# Patient Record
Sex: Male | Born: 1950 | Race: White | Hispanic: No | Marital: Married | Smoking: Current every day smoker
Health system: Southern US, Community
[De-identification: ages and names within clinical notes are randomized; demographics above are authoritative.]

## PROBLEM LIST (undated history)

## (undated) DIAGNOSIS — G4730 Sleep apnea, unspecified: Secondary | ICD-10-CM

## (undated) DIAGNOSIS — R7303 Prediabetes: Secondary | ICD-10-CM

## (undated) DIAGNOSIS — I10 Essential (primary) hypertension: Secondary | ICD-10-CM

## (undated) DIAGNOSIS — R0600 Dyspnea, unspecified: Secondary | ICD-10-CM

## (undated) DIAGNOSIS — G4700 Insomnia, unspecified: Secondary | ICD-10-CM

## (undated) DIAGNOSIS — I499 Cardiac arrhythmia, unspecified: Secondary | ICD-10-CM

## (undated) HISTORY — DX: Essential (primary) hypertension: I10

## (undated) HISTORY — PX: ATRIAL FIBRILLATION ABLATION: SHX5732

## (undated) HISTORY — PX: HERNIA REPAIR: SHX51

## (undated) HISTORY — PX: CARDIAC CATHETERIZATION: SHX172

## (undated) HISTORY — DX: Insomnia, unspecified: G47.00

---

## 2003-08-27 ENCOUNTER — Ambulatory Visit (HOSPITAL_COMMUNITY): Admission: RE | Admit: 2003-08-27 | Discharge: 2003-08-27 | Payer: Self-pay | Admitting: Internal Medicine

## 2014-12-03 ENCOUNTER — Ambulatory Visit (HOSPITAL_COMMUNITY)
Admission: RE | Admit: 2014-12-03 | Discharge: 2014-12-03 | Disposition: A | Discharge status: Home / Self Care | Payer: 59 | Source: Ambulatory Visit | Attending: Internal Medicine | Admitting: Internal Medicine

## 2014-12-03 ENCOUNTER — Other Ambulatory Visit (HOSPITAL_COMMUNITY): Payer: Self-pay | Admitting: Internal Medicine

## 2014-12-03 DIAGNOSIS — R0600 Dyspnea, unspecified: Secondary | ICD-10-CM | POA: Diagnosis not present

## 2014-12-06 ENCOUNTER — Ambulatory Visit (HOSPITAL_COMMUNITY)
Admission: RE | Admit: 2014-12-06 | Discharge: 2014-12-06 | Disposition: A | Discharge status: Home / Self Care | Payer: 59 | Source: Ambulatory Visit | Attending: Internal Medicine | Admitting: Internal Medicine

## 2014-12-06 ENCOUNTER — Other Ambulatory Visit (HOSPITAL_COMMUNITY): Payer: Self-pay | Admitting: Internal Medicine

## 2014-12-06 DIAGNOSIS — R0600 Dyspnea, unspecified: Secondary | ICD-10-CM

## 2014-12-06 DIAGNOSIS — R0602 Shortness of breath: Secondary | ICD-10-CM | POA: Diagnosis present

## 2014-12-06 DIAGNOSIS — J90 Pleural effusion, not elsewhere classified: Secondary | ICD-10-CM | POA: Insufficient documentation

## 2014-12-12 DIAGNOSIS — R601 Generalized edema: Secondary | ICD-10-CM | POA: Insufficient documentation

## 2014-12-24 ENCOUNTER — Ambulatory Visit (INDEPENDENT_AMBULATORY_CARE_PROVIDER_SITE_OTHER): Payer: 59 | Admitting: Neurology

## 2014-12-24 ENCOUNTER — Encounter: Payer: Self-pay | Admitting: Neurology

## 2014-12-24 VITALS — BP 98/55 | HR 86 | Temp 98.6°F | Resp 20 | Ht 68.0 in | Wt 253.0 lb

## 2014-12-24 DIAGNOSIS — M7989 Other specified soft tissue disorders: Secondary | ICD-10-CM | POA: Diagnosis not present

## 2014-12-24 DIAGNOSIS — G4734 Idiopathic sleep related nonobstructive alveolar hypoventilation: Secondary | ICD-10-CM | POA: Diagnosis not present

## 2014-12-24 DIAGNOSIS — G4733 Obstructive sleep apnea (adult) (pediatric): Secondary | ICD-10-CM | POA: Diagnosis not present

## 2014-12-24 DIAGNOSIS — I517 Cardiomegaly: Secondary | ICD-10-CM | POA: Diagnosis not present

## 2014-12-24 DIAGNOSIS — E669 Obesity, unspecified: Secondary | ICD-10-CM

## 2014-12-24 NOTE — Progress Notes (Signed)
Subjective:    Patient ID: Larry Moore is a 64 y.o. male.  HPI     Star Age, MD, PhD [facility] [address] [place],  [address]  Dear Dr. Gerarda Fraction,   I saw your patient, Larry Moore, upon your kind request in my neurologic clinic today for initial consultation of his sleep disorder, in particular, concern for underlying obstructive sleep apnea in the context of snoring and difficulty maintaining sleep. The patient is accompanied by his wife today. 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 He has suffered from difficulty maintaining sleep for years. Snoring can be loud and his wife who no longer sleeps in the same bedroom with him, has noted breathing pauses while he is asleep in the past. He has been placed on Lasix. He drinks a bottle of wine per day. He falls asleep quickly but does not stay asleep. He feels, he wakes up every hour. He goes to bed between 9 and 10 and falls asleep within a few minutes. He gets out of bed in the morning around 3 AM because he cannot go back to sleep. He tried Ambien but he had balance issues and felt like he was falling. He does not drink enough water. He likes to drink orange juice some. He retired last year. He is not sure if he has a family history of sleep apnea. He denies morning headaches or versus leg symptoms. He has had leg discomfort and severe lower extremity edema which has improved. He was referred to cardiology and has seen a cardiologist in [place]. He was diagnosed with cardiomegaly and had some PVCs on EKG but no A. fib. He had a pulse oximetry test a couple weeks ago which showed severe desaturations. Total testing time per patient was around 5 hours and 3-1/2 hours he  spent below 88% saturation. He has been placed on nocturnal oxygen at 2 L/m. Since he has been using oxygen he is able to sleep in his bed. Previously he was sleeping in his recliner for years. He quit smoking in the past 2 months. He drinks about a cup of coffee per day. His Epworth sleepiness score is 24 out of 24 and he does not drive any longer distances as he has a tendency to fall asleep. He has never had a sleep study.  His Past Medical History Is Significant For: Past Medical History  Diagnosis Date  . Insomnia   . Hypertension     His Past Surgical History Is Significant For: Past Surgical History  Procedure Laterality Date  . Hernia repair      His Family History Is Significant For: Family History  Problem Relation Age of Onset  . Coronary artery disease Father   . Diabetes Father     His Social History Is Significant For: History   Social History  . Marital Status: Married    Spouse Name: N/A  . Number of Children: N/A  . Years of Education: N/A   Occupational History  . Retired    Social History Main Topics  . Smoking status: Former Research scientist (life sciences)  . Smokeless tobacco: Not on file     Comment: Quit 2015  . Alcohol Use: 0.0 oz/week    0 Standard drinks or equivalent per week  Comment: 1 bottle wine a day   . Drug Use: No  . Sexual Activity: Not on file   Other Topics Concern  . None   Social History Narrative    His Allergies Are:  No Known Allergies:   His Current Medications Are:  Outpatient Encounter Prescriptions as of 12/24/2014  Medication Sig  . aspirin 325 MG tablet Take 325 mg by mouth daily.  . furosemide (LASIX) 80 MG tablet Take 80 mg by mouth.  . metolazone (ZAROXOLYN) 2.5 MG tablet Take 2.5 mg by mouth daily.  Marland Kitchen olmesartan (BENICAR) 40 MG tablet Take 40 mg by mouth daily.  Marland Kitchen zolpidem (AMBIEN) 5 MG tablet Take 5 mg by mouth at bedtime as needed for sleep.  . [DISCONTINUED] albuterol (PROVENTIL HFA;VENTOLIN HFA) 108 (90 BASE) MCG/ACT  inhaler Inhale 2 puffs into the lungs every 6 (six) hours as needed for wheezing or shortness of breath.  . [DISCONTINUED] albuterol (PROVENTIL) 4 MG tablet Take 4 mg by mouth 3 (three) times daily.  . [DISCONTINUED] furosemide (LASIX) 20 MG tablet Take 20 mg by mouth daily.  . [DISCONTINUED] potassium chloride SA (K-DUR,KLOR-CON) 20 MEQ tablet Take 20 mEq by mouth daily.  :  Review of Systems:  Out of a complete 14 point review of systems, all are reviewed and negative with the exception of these symptoms as listed below:   Review of Systems  Constitutional:       Weight gain   Respiratory: Positive for shortness of breath.   Cardiovascular: Positive for leg swelling.  Neurological:       Insomnia, Sleepiness, Snoring   Psychiatric/Behavioral:       Too much sleep, Not enough sleep, Decreased energy     Objective:  Neurologic Exam  Physical Exam Physical Examination:   Filed Vitals:   12/24/14 1431  BP: 98/55  Pulse: 86  Temp: 98.6 F (37 C)  Resp: 20    General Examination: The patient is a very pleasant 64 y.o. male in no acute distress. He appears chronically ill-appearing, deconditioned, somewhat dehydrated as well.  HEENT: Normocephalic, atraumatic, pupils are equal, round and reactive to light and accommodation. Funduscopic exam is normal with sharp disc margins noted. Extraocular tracking is good without limitation to gaze excursion or nystagmus noted. Normal smooth pursuit is noted. Hearing is grossly intact. Tympanic membranes are clear bilaterally. Face is symmetric with normal facial animation and normal facial sensation. Speech is clear with no dysarthria noted. There is no hypophonia. There is no lip, neck/head, jaw or voice tremor. Neck is supple with full range of passive and active motion. There are no carotid bruits on auscultation. Oropharynx exam reveals: moderate mouth dryness, adequate dental hygiene and moderate airway crowding, due t Larger tongue base,  thicker soft palate, large uvula. Tonsils are absent. Throat is erythematous and uvula appear swollen. Mallampati is class III. Neck size is 18 inches.  Chest: Clear to auscultation without wheezing, rhonchi or crackles noted.  Heart: S1+S2+0, regular but it appears he has multiple PVCs or extra heartbeats. Overall pulse is regular and there are no murmurs, rubs or gallops noted.   Abdomen: Soft, protuberant, non-tender and non-distended with normal bowel sounds appreciated on auscultation.  Extremities: There is 3+ pitting edema in the distal lower extremities bilaterally. Pedal pulses are intact.  Skin: Warm and dry with chronic appearing changes. He has thicker and erythematous skin in keeping with possible chronic cellulitic changes. Pedal pulses are very difficult to obtain.   Musculoskeletal: exam reveals  no obvious joint deformities, tenderness or joint swelling or erythema.   Neurologically:  Mental status: The patient is awake, alert and oriented in all 4 spheres. His immediate and remote memory, attention, language skills and fund of knowledge are appropriate. There is no evidence of aphasia, agnosia, apraxia or anomia. Speech is clear with normal prosody and enunciation. Thought process is linear. Mood is normal and affect is blunted.  Cranial nerves II - XII are as described above under HEENT exam. In addition: shoulder shrug is normal with equal shoulder height noted. Motor exam: Normal bulk, strength and tone is noted. There is no drift, tremor or rebound. Romberg is negative. Reflexes are 2+ throughout. Babinski: Not able to test as he cannot take his shoes off easily. Fine motor skills and coordination seem intact with normal finger taps, normal hand movements, normal rapid alternating patting, normal foot taps and normal foot agility.  Cerebellar testing: No dysmetria or intention tremor on finger to nose testing. Heel to shin is unremarkable bilaterally. There is no truncal or  gait ataxia.  Sensory exam: intact to light touch, pinprick, vibration, temperature sense in the upper and lower extremities.  Gait, station and balance: He stands with difficulty. No veering to one side is noted. No leaning to one side is noted. Posture is age-appropriate and stance is slightly wide-based. He walks slightly slowly and cautiously. He turns in 2 steps.              Assessment and Plan:  In summary, Larry Moore is a very pleasant 64 y.o.-year old male with an underlying medical history of obesity, lower extremity edema, recent diagnosis of cardiomegaly, Hx of hypertension, whose history and physical exam are in keeping with obstructive sleep apnea (OSA). I had a long chat with the patient and his wife about my findings and the diagnosis of OSA, its prognosis and treatment options. We talked about medical treatments, surgical interventions and non-pharmacological approaches. I explained in particular the risks and ramifications of untreated moderate to severe OSA, especially with respect to developing cardiovascular disease down the Road, including congestive heart failure, difficult to treat hypertension, cardiac arrhythmias, or stroke. Even type 2 diabetes has, in part, been linked to untreated OSA. Symptoms of untreated OSA include daytime sleepiness, memory problems, mood irritability and mood disorder such as depression and anxiety, lack of energy, as well as recurrent headaches, especially morning headaches. We talked abouthis daily alcohol intake. A bottle of wine is too much. He is advised to lower his alcohol intake and increase his water intake. We talked about maintaining a healthy lifestyle in general, as well as the importance of weight control. I encouraged the patient to eat healthy, exercise daily and keep well hydrated, to keep a scheduled bedtime and wake time routine, to not skip any meals and eat healthy snacks in between meals. I advised the patient not to drive  when feeling sleepy.  I recommended the following at this time: sleep study with potential positive airway pressure titration. (We will score hypopneas at 4% and split the sleep study into diagnostic and treatment portion, if the estimated. 2 hour AHI is >20/h). We will do the titration without supplemental oxygen and add oxygen if needed. He is currently on nocturnal oxygen at 2 L/m since his abnormal pulse oximetry test recently. We will request those test results from your office.   I explained the sleep test procedure to the patient and also outlined possible surgical and non-surgical treatment options  of OSA, including the use of a custom-made dental device (which would require a referral to a specialist dentist or oral surgeon), upper airway surgical options, such as pillar implants, radiofrequency surgery, tongue base surgery, and UPPP (which would involve a referral to an ENT surgeon). Rarely, jaw surgery such as mandibular advancement may be considered.  I also explained the CPAP treatment option to the patient, who indicated that he would be willing to try CPAP if the need arises. I explained the importance of being compliant with PAP treatment, not only for insurance purposes but primarily to improve His symptoms, and for the patient's long term health benefit, including to reduce His cardiovascular risks. I answered all their questions today and the patient and his wife were in agreement. I would like to see him back after the sleep study is completed and encouraged him to call with any interim questions, concerns, problems or updates.   Thank you very much for allowing me to participate in the care of this nice patient. If I can be of any further assistance to you please do not hesitate to call me at [phone].  Sincerely,   Star Age, MD, PhD

## 2014-12-24 NOTE — Patient Instructions (Addendum)
Based on your symptoms and your exam I believe you are at risk for obstructive sleep apnea or OSA, and I think we should proceed with a sleep study to determine whether you do or do not have OSA and how severe it is. If you have more than mild OSA, I want you to consider treatment with CPAP. Please remember, the risks and ramifications of moderate to severe obstructive sleep apnea or OSA are: Cardiovascular disease, including congestive heart failure, stroke, difficult to control hypertension, arrhythmias, and even type 2 diabetes has been linked to untreated OSA. Sleep apnea causes disruption of sleep and sleep deprivation in most cases, which, in turn, can cause recurrent headaches, problems with memory, mood, concentration, focus, and vigilance. Most people with untreated sleep apnea report excessive daytime sleepiness, which can affect their ability to drive. Please do not drive if you feel sleepy.  I will see you back after your sleep study to go over the test results and where to go from there. We will call you after your sleep study and to set up an appointment at the time.   Please reduce your alcohol intake and increase your water intake.

## 2014-12-26 ENCOUNTER — Telehealth: Payer: Self-pay | Admitting: Neurology

## 2014-12-26 DIAGNOSIS — G4734 Idiopathic sleep related nonobstructive alveolar hypoventilation: Secondary | ICD-10-CM

## 2014-12-26 DIAGNOSIS — G4733 Obstructive sleep apnea (adult) (pediatric): Secondary | ICD-10-CM

## 2014-12-26 NOTE — Telephone Encounter (Signed)
Patient has been denied by [facility] for the in lab sleep study after sending clinical notes and calling to speak with someone to try and get it approved. They state patient should be able to have a HST with his medical history, please advise me if you would like to set up a HST or do a peer to peer. Thanks

## 2014-12-26 NOTE — Telephone Encounter (Signed)
This patient's insurance denied an attended sleep study. I will order home sleep test.  

## 2015-01-03 ENCOUNTER — Ambulatory Visit (INDEPENDENT_AMBULATORY_CARE_PROVIDER_SITE_OTHER): Payer: 59 | Admitting: Neurology

## 2015-01-03 DIAGNOSIS — G4733 Obstructive sleep apnea (adult) (pediatric): Secondary | ICD-10-CM

## 2015-01-03 DIAGNOSIS — G4734 Idiopathic sleep related nonobstructive alveolar hypoventilation: Secondary | ICD-10-CM

## 2015-01-03 NOTE — Sleep Study (Signed)
Please see the scanned sleep study interpretation located in the Procedure tab within the Chart Review section. 

## 2015-01-09 ENCOUNTER — Telehealth: Payer: Self-pay | Admitting: Neurology

## 2015-01-09 DIAGNOSIS — G4733 Obstructive sleep apnea (adult) (pediatric): Secondary | ICD-10-CM

## 2015-01-09 DIAGNOSIS — G4734 Idiopathic sleep related nonobstructive alveolar hypoventilation: Secondary | ICD-10-CM

## 2015-01-09 NOTE — Telephone Encounter (Signed)
Please call and notify the patient that the recent home sleep test did suggest the diagnosis of severe obstructive sleep apnea with severe desaturations noted, and I recommend treatment for this in the form of CPAP. I will request an overnight sleep study for proper titration and mask fitting. Please explain to patient and arrange for a CPAP titration study. I have placed an order in the chart. Thanks, Star Age, MD, PhD [facility])

## 2015-01-14 ENCOUNTER — Telehealth: Payer: Self-pay | Admitting: Neurology

## 2015-01-14 DIAGNOSIS — G4734 Idiopathic sleep related nonobstructive alveolar hypoventilation: Secondary | ICD-10-CM

## 2015-01-14 DIAGNOSIS — G4733 Obstructive sleep apnea (adult) (pediatric): Secondary | ICD-10-CM

## 2015-01-14 NOTE — Telephone Encounter (Signed)
[employer] representative: Terrence Dupont, called office to inform Denial of request for CPAP titration for the pt. Requests peer to peer at [phone] Reference number [id number]. Please advise.

## 2015-01-15 NOTE — Telephone Encounter (Signed)
Please call patient back. I really would like for him to get started on treatment for his severe sleep apnea. Rather than bringing him back for full night CPAP titration which was denied by his insurance I would like to suggest he try AutoPap at home. I will place an order for that. Once he's on treatment he can start AutoPap and we will order a overnight pulse oximetry test while he is on AutoPap to see if he can maintain his oxygen saturations or whether we need to add oxygen to treatment. Please also go ahead and schedule a 6 week follow-up. He should have an existing DME provider who supplied the oxygen at home. Once the patient is okay with the plan, please process DME orders and sent to his DME company.

## 2015-01-16 NOTE — Telephone Encounter (Signed)
Please see other message, I have left message for patient to call back.

## 2015-01-16 NOTE — Telephone Encounter (Signed)
I left a message for patient to call back in regards to Cpap/Sleep Study. I have orders for (below mentioned) AutoPap on my desk or in my Sleep Folder.

## 2015-01-16 NOTE — Telephone Encounter (Signed)
Pls try contacting patient again. I really want him to get Started on treatment. We will try to do a three-day pulse oximetry test while on treatment and a 30 day download. If his treatment setting and his oxygen saturations are not enough to treat his OSA, I will appeal his insurance denial for an attended sleep study with titration of PAP.

## 2015-01-17 ENCOUNTER — Encounter: Payer: Self-pay | Admitting: Neurology

## 2015-01-17 ENCOUNTER — Encounter: Payer: Self-pay | Admitting: *Deleted

## 2015-01-17 NOTE — Telephone Encounter (Signed)
Patient was contacted in order to set him up with his DME of choice.  Patient opted to go with [employer].  A referral for Auto CPAP and a Pulse Oximetry was sent to [facility].  Patient was advised to schedule his follow up appointment 6-12 weeks after starting his therapy.

## 2015-01-23 NOTE — Telephone Encounter (Signed)
See other phone note, patient will proceed with CPAP.

## 2015-01-25 ENCOUNTER — Encounter: Payer: Self-pay | Admitting: Neurology

## 2015-01-30 ENCOUNTER — Encounter: Payer: Self-pay | Admitting: Neurology

## 2015-02-14 ENCOUNTER — Other Ambulatory Visit (HOSPITAL_COMMUNITY): Payer: Self-pay | Admitting: Family Medicine

## 2015-02-14 ENCOUNTER — Ambulatory Visit (HOSPITAL_COMMUNITY)
Admission: RE | Admit: 2015-02-14 | Discharge: 2015-02-14 | Disposition: A | Discharge status: Home / Self Care | Payer: 59 | Source: Ambulatory Visit | Attending: Family Medicine | Admitting: Family Medicine

## 2015-02-14 DIAGNOSIS — S52592A Other fractures of lower end of left radius, initial encounter for closed fracture: Secondary | ICD-10-CM | POA: Diagnosis not present

## 2015-02-14 DIAGNOSIS — M25532 Pain in left wrist: Secondary | ICD-10-CM

## 2015-02-14 DIAGNOSIS — W19XXXA Unspecified fall, initial encounter: Secondary | ICD-10-CM | POA: Diagnosis not present

## 2015-05-03 ENCOUNTER — Telehealth: Payer: Self-pay

## 2015-05-03 NOTE — Telephone Encounter (Signed)
Left message to make appt with Dr. Rexene Alberts to f/u on CPAP treatment. I will also send a letter to remind patient to f/u with Dr. Rexene Alberts. Patient seems to be doing well on CPAP but is having a lot of leaking.

## 2015-11-24 DIAGNOSIS — G4733 Obstructive sleep apnea (adult) (pediatric): Secondary | ICD-10-CM | POA: Diagnosis not present

## 2015-12-22 DIAGNOSIS — G4733 Obstructive sleep apnea (adult) (pediatric): Secondary | ICD-10-CM | POA: Diagnosis not present

## 2015-12-26 DIAGNOSIS — R601 Generalized edema: Secondary | ICD-10-CM | POA: Diagnosis not present

## 2015-12-26 DIAGNOSIS — R0602 Shortness of breath: Secondary | ICD-10-CM | POA: Diagnosis not present

## 2016-01-09 DIAGNOSIS — G4733 Obstructive sleep apnea (adult) (pediatric): Secondary | ICD-10-CM | POA: Diagnosis not present

## 2016-01-22 DIAGNOSIS — G4733 Obstructive sleep apnea (adult) (pediatric): Secondary | ICD-10-CM | POA: Diagnosis not present

## 2016-01-27 DIAGNOSIS — H5211 Myopia, right eye: Secondary | ICD-10-CM | POA: Diagnosis not present

## 2016-01-27 DIAGNOSIS — H5202 Hypermetropia, left eye: Secondary | ICD-10-CM | POA: Diagnosis not present

## 2016-01-27 DIAGNOSIS — H52223 Regular astigmatism, bilateral: Secondary | ICD-10-CM | POA: Diagnosis not present

## 2016-01-27 DIAGNOSIS — H524 Presbyopia: Secondary | ICD-10-CM | POA: Diagnosis not present

## 2016-02-21 DIAGNOSIS — G4733 Obstructive sleep apnea (adult) (pediatric): Secondary | ICD-10-CM | POA: Diagnosis not present

## 2016-03-19 DIAGNOSIS — G4733 Obstructive sleep apnea (adult) (pediatric): Secondary | ICD-10-CM | POA: Diagnosis not present

## 2016-03-23 DIAGNOSIS — G4733 Obstructive sleep apnea (adult) (pediatric): Secondary | ICD-10-CM | POA: Diagnosis not present

## 2016-04-22 DIAGNOSIS — G4733 Obstructive sleep apnea (adult) (pediatric): Secondary | ICD-10-CM | POA: Diagnosis not present

## 2016-05-23 DIAGNOSIS — G4733 Obstructive sleep apnea (adult) (pediatric): Secondary | ICD-10-CM | POA: Diagnosis not present

## 2016-06-16 DIAGNOSIS — G4733 Obstructive sleep apnea (adult) (pediatric): Secondary | ICD-10-CM | POA: Diagnosis not present

## 2016-06-16 DIAGNOSIS — J449 Chronic obstructive pulmonary disease, unspecified: Secondary | ICD-10-CM | POA: Diagnosis not present

## 2016-06-16 DIAGNOSIS — R0902 Hypoxemia: Secondary | ICD-10-CM | POA: Diagnosis not present

## 2016-06-23 DIAGNOSIS — G4733 Obstructive sleep apnea (adult) (pediatric): Secondary | ICD-10-CM | POA: Diagnosis not present

## 2016-07-10 DIAGNOSIS — G4733 Obstructive sleep apnea (adult) (pediatric): Secondary | ICD-10-CM | POA: Diagnosis not present

## 2016-07-16 DIAGNOSIS — J449 Chronic obstructive pulmonary disease, unspecified: Secondary | ICD-10-CM | POA: Diagnosis not present

## 2016-07-16 DIAGNOSIS — G4733 Obstructive sleep apnea (adult) (pediatric): Secondary | ICD-10-CM | POA: Diagnosis not present

## 2016-07-16 DIAGNOSIS — R0902 Hypoxemia: Secondary | ICD-10-CM | POA: Diagnosis not present

## 2016-07-23 DIAGNOSIS — G4733 Obstructive sleep apnea (adult) (pediatric): Secondary | ICD-10-CM | POA: Diagnosis not present

## 2016-08-07 DIAGNOSIS — Z23 Encounter for immunization: Secondary | ICD-10-CM | POA: Diagnosis not present

## 2016-08-11 DIAGNOSIS — M79673 Pain in unspecified foot: Secondary | ICD-10-CM | POA: Diagnosis not present

## 2016-08-11 DIAGNOSIS — B351 Tinea unguium: Secondary | ICD-10-CM | POA: Diagnosis not present

## 2016-08-16 DIAGNOSIS — J449 Chronic obstructive pulmonary disease, unspecified: Secondary | ICD-10-CM | POA: Diagnosis not present

## 2016-08-16 DIAGNOSIS — R0902 Hypoxemia: Secondary | ICD-10-CM | POA: Diagnosis not present

## 2016-08-16 DIAGNOSIS — G4733 Obstructive sleep apnea (adult) (pediatric): Secondary | ICD-10-CM | POA: Diagnosis not present

## 2016-08-23 DIAGNOSIS — G4733 Obstructive sleep apnea (adult) (pediatric): Secondary | ICD-10-CM | POA: Diagnosis not present

## 2016-09-02 DIAGNOSIS — Z79899 Other long term (current) drug therapy: Secondary | ICD-10-CM | POA: Diagnosis not present

## 2016-09-02 DIAGNOSIS — M25474 Effusion, right foot: Secondary | ICD-10-CM | POA: Diagnosis not present

## 2016-09-02 DIAGNOSIS — M109 Gout, unspecified: Secondary | ICD-10-CM | POA: Diagnosis not present

## 2016-09-03 DIAGNOSIS — M109 Gout, unspecified: Secondary | ICD-10-CM | POA: Diagnosis not present

## 2016-09-03 DIAGNOSIS — Z79899 Other long term (current) drug therapy: Secondary | ICD-10-CM | POA: Diagnosis not present

## 2016-09-15 DIAGNOSIS — R0902 Hypoxemia: Secondary | ICD-10-CM | POA: Diagnosis not present

## 2016-09-15 DIAGNOSIS — J449 Chronic obstructive pulmonary disease, unspecified: Secondary | ICD-10-CM | POA: Diagnosis not present

## 2016-09-15 DIAGNOSIS — G4733 Obstructive sleep apnea (adult) (pediatric): Secondary | ICD-10-CM | POA: Diagnosis not present

## 2016-09-22 DIAGNOSIS — G4733 Obstructive sleep apnea (adult) (pediatric): Secondary | ICD-10-CM | POA: Diagnosis not present

## 2016-10-14 DIAGNOSIS — Z79899 Other long term (current) drug therapy: Secondary | ICD-10-CM | POA: Diagnosis not present

## 2016-10-14 DIAGNOSIS — M25474 Effusion, right foot: Secondary | ICD-10-CM | POA: Diagnosis not present

## 2016-10-14 DIAGNOSIS — M109 Gout, unspecified: Secondary | ICD-10-CM | POA: Diagnosis not present

## 2016-10-16 DIAGNOSIS — G4733 Obstructive sleep apnea (adult) (pediatric): Secondary | ICD-10-CM | POA: Diagnosis not present

## 2016-10-16 DIAGNOSIS — R0902 Hypoxemia: Secondary | ICD-10-CM | POA: Diagnosis not present

## 2016-10-16 DIAGNOSIS — J449 Chronic obstructive pulmonary disease, unspecified: Secondary | ICD-10-CM | POA: Diagnosis not present

## 2016-10-19 DIAGNOSIS — G4733 Obstructive sleep apnea (adult) (pediatric): Secondary | ICD-10-CM | POA: Diagnosis not present

## 2016-10-20 DIAGNOSIS — R55 Syncope and collapse: Secondary | ICD-10-CM | POA: Diagnosis not present

## 2016-10-23 DIAGNOSIS — G4733 Obstructive sleep apnea (adult) (pediatric): Secondary | ICD-10-CM | POA: Diagnosis not present

## 2016-10-23 DIAGNOSIS — Z7982 Long term (current) use of aspirin: Secondary | ICD-10-CM | POA: Diagnosis not present

## 2016-10-23 DIAGNOSIS — I361 Nonrheumatic tricuspid (valve) insufficiency: Secondary | ICD-10-CM | POA: Diagnosis not present

## 2016-10-23 DIAGNOSIS — R55 Syncope and collapse: Secondary | ICD-10-CM | POA: Diagnosis not present

## 2016-10-23 DIAGNOSIS — Z7952 Long term (current) use of systemic steroids: Secondary | ICD-10-CM | POA: Diagnosis not present

## 2016-10-23 DIAGNOSIS — I272 Pulmonary hypertension, unspecified: Secondary | ICD-10-CM | POA: Diagnosis not present

## 2016-10-23 DIAGNOSIS — I2510 Atherosclerotic heart disease of native coronary artery without angina pectoris: Secondary | ICD-10-CM | POA: Diagnosis not present

## 2016-10-23 DIAGNOSIS — Z7289 Other problems related to lifestyle: Secondary | ICD-10-CM | POA: Diagnosis not present

## 2016-10-23 DIAGNOSIS — I340 Nonrheumatic mitral (valve) insufficiency: Secondary | ICD-10-CM | POA: Diagnosis not present

## 2016-10-23 DIAGNOSIS — I10 Essential (primary) hypertension: Secondary | ICD-10-CM | POA: Diagnosis not present

## 2016-10-23 DIAGNOSIS — F17210 Nicotine dependence, cigarettes, uncomplicated: Secondary | ICD-10-CM | POA: Diagnosis not present

## 2016-10-23 DIAGNOSIS — Z79899 Other long term (current) drug therapy: Secondary | ICD-10-CM | POA: Diagnosis not present

## 2016-10-27 DIAGNOSIS — R55 Syncope and collapse: Secondary | ICD-10-CM | POA: Diagnosis not present

## 2016-10-30 DIAGNOSIS — E876 Hypokalemia: Secondary | ICD-10-CM | POA: Diagnosis not present

## 2016-10-30 DIAGNOSIS — Z79899 Other long term (current) drug therapy: Secondary | ICD-10-CM | POA: Diagnosis not present

## 2016-11-10 DIAGNOSIS — B351 Tinea unguium: Secondary | ICD-10-CM | POA: Diagnosis not present

## 2016-11-10 DIAGNOSIS — M79676 Pain in unspecified toe(s): Secondary | ICD-10-CM | POA: Diagnosis not present

## 2016-11-16 DIAGNOSIS — G4733 Obstructive sleep apnea (adult) (pediatric): Secondary | ICD-10-CM | POA: Diagnosis not present

## 2016-11-16 DIAGNOSIS — J449 Chronic obstructive pulmonary disease, unspecified: Secondary | ICD-10-CM | POA: Diagnosis not present

## 2016-11-16 DIAGNOSIS — R0902 Hypoxemia: Secondary | ICD-10-CM | POA: Diagnosis not present

## 2016-11-17 DIAGNOSIS — H25813 Combined forms of age-related cataract, bilateral: Secondary | ICD-10-CM | POA: Diagnosis not present

## 2016-11-17 DIAGNOSIS — H5211 Myopia, right eye: Secondary | ICD-10-CM | POA: Diagnosis not present

## 2016-11-17 DIAGNOSIS — H52223 Regular astigmatism, bilateral: Secondary | ICD-10-CM | POA: Diagnosis not present

## 2016-11-17 DIAGNOSIS — H5202 Hypermetropia, left eye: Secondary | ICD-10-CM | POA: Diagnosis not present

## 2016-11-23 DIAGNOSIS — Z79899 Other long term (current) drug therapy: Secondary | ICD-10-CM | POA: Diagnosis not present

## 2016-11-23 DIAGNOSIS — M109 Gout, unspecified: Secondary | ICD-10-CM | POA: Diagnosis not present

## 2016-11-23 DIAGNOSIS — G4733 Obstructive sleep apnea (adult) (pediatric): Secondary | ICD-10-CM | POA: Diagnosis not present

## 2016-11-23 DIAGNOSIS — R0902 Hypoxemia: Secondary | ICD-10-CM | POA: Diagnosis not present

## 2016-11-23 DIAGNOSIS — J449 Chronic obstructive pulmonary disease, unspecified: Secondary | ICD-10-CM | POA: Diagnosis not present

## 2016-11-25 DIAGNOSIS — Z79899 Other long term (current) drug therapy: Secondary | ICD-10-CM | POA: Diagnosis not present

## 2016-11-25 DIAGNOSIS — M25474 Effusion, right foot: Secondary | ICD-10-CM | POA: Diagnosis not present

## 2016-11-25 DIAGNOSIS — M109 Gout, unspecified: Secondary | ICD-10-CM | POA: Diagnosis not present

## 2016-12-07 DIAGNOSIS — R002 Palpitations: Secondary | ICD-10-CM | POA: Insufficient documentation

## 2016-12-14 DIAGNOSIS — R0902 Hypoxemia: Secondary | ICD-10-CM | POA: Diagnosis not present

## 2016-12-14 DIAGNOSIS — G4733 Obstructive sleep apnea (adult) (pediatric): Secondary | ICD-10-CM | POA: Diagnosis not present

## 2016-12-14 DIAGNOSIS — J449 Chronic obstructive pulmonary disease, unspecified: Secondary | ICD-10-CM | POA: Diagnosis not present

## 2016-12-21 DIAGNOSIS — G4733 Obstructive sleep apnea (adult) (pediatric): Secondary | ICD-10-CM | POA: Diagnosis not present

## 2017-01-06 DIAGNOSIS — H2513 Age-related nuclear cataract, bilateral: Secondary | ICD-10-CM | POA: Diagnosis not present

## 2017-01-06 DIAGNOSIS — H01009 Unspecified blepharitis unspecified eye, unspecified eyelid: Secondary | ICD-10-CM | POA: Diagnosis not present

## 2017-01-06 DIAGNOSIS — I10 Essential (primary) hypertension: Secondary | ICD-10-CM | POA: Diagnosis not present

## 2017-01-06 DIAGNOSIS — H1010 Acute atopic conjunctivitis, unspecified eye: Secondary | ICD-10-CM | POA: Diagnosis not present

## 2017-01-25 DIAGNOSIS — M25474 Effusion, right foot: Secondary | ICD-10-CM | POA: Diagnosis not present

## 2017-01-25 DIAGNOSIS — Z79899 Other long term (current) drug therapy: Secondary | ICD-10-CM | POA: Diagnosis not present

## 2017-01-25 DIAGNOSIS — M109 Gout, unspecified: Secondary | ICD-10-CM | POA: Diagnosis not present

## 2017-01-25 DIAGNOSIS — H2511 Age-related nuclear cataract, right eye: Secondary | ICD-10-CM | POA: Diagnosis not present

## 2017-02-02 DIAGNOSIS — M79676 Pain in unspecified toe(s): Secondary | ICD-10-CM | POA: Diagnosis not present

## 2017-02-02 DIAGNOSIS — B351 Tinea unguium: Secondary | ICD-10-CM | POA: Diagnosis not present

## 2017-02-09 DIAGNOSIS — H2511 Age-related nuclear cataract, right eye: Secondary | ICD-10-CM | POA: Diagnosis not present

## 2017-02-09 DIAGNOSIS — H25813 Combined forms of age-related cataract, bilateral: Secondary | ICD-10-CM | POA: Diagnosis not present

## 2017-02-16 DIAGNOSIS — H2512 Age-related nuclear cataract, left eye: Secondary | ICD-10-CM | POA: Diagnosis not present

## 2017-02-20 DIAGNOSIS — G4733 Obstructive sleep apnea (adult) (pediatric): Secondary | ICD-10-CM | POA: Diagnosis not present

## 2017-03-23 DIAGNOSIS — G4733 Obstructive sleep apnea (adult) (pediatric): Secondary | ICD-10-CM | POA: Diagnosis not present

## 2017-04-13 DIAGNOSIS — B351 Tinea unguium: Secondary | ICD-10-CM | POA: Diagnosis not present

## 2017-04-13 DIAGNOSIS — M79676 Pain in unspecified toe(s): Secondary | ICD-10-CM | POA: Diagnosis not present

## 2017-04-19 DIAGNOSIS — G4733 Obstructive sleep apnea (adult) (pediatric): Secondary | ICD-10-CM | POA: Diagnosis not present

## 2017-04-22 DIAGNOSIS — G4733 Obstructive sleep apnea (adult) (pediatric): Secondary | ICD-10-CM | POA: Diagnosis not present

## 2017-05-23 DIAGNOSIS — G4733 Obstructive sleep apnea (adult) (pediatric): Secondary | ICD-10-CM | POA: Diagnosis not present

## 2017-06-22 DIAGNOSIS — M79676 Pain in unspecified toe(s): Secondary | ICD-10-CM | POA: Diagnosis not present

## 2017-06-22 DIAGNOSIS — B351 Tinea unguium: Secondary | ICD-10-CM | POA: Diagnosis not present

## 2017-06-23 DIAGNOSIS — G4733 Obstructive sleep apnea (adult) (pediatric): Secondary | ICD-10-CM | POA: Diagnosis not present

## 2017-07-22 DIAGNOSIS — Z23 Encounter for immunization: Secondary | ICD-10-CM | POA: Diagnosis not present

## 2017-07-23 DIAGNOSIS — G4733 Obstructive sleep apnea (adult) (pediatric): Secondary | ICD-10-CM | POA: Diagnosis not present

## 2017-08-23 DIAGNOSIS — G4733 Obstructive sleep apnea (adult) (pediatric): Secondary | ICD-10-CM | POA: Diagnosis not present

## 2017-08-31 DIAGNOSIS — M79676 Pain in unspecified toe(s): Secondary | ICD-10-CM | POA: Diagnosis not present

## 2017-08-31 DIAGNOSIS — B351 Tinea unguium: Secondary | ICD-10-CM | POA: Diagnosis not present

## 2017-09-22 DIAGNOSIS — G4733 Obstructive sleep apnea (adult) (pediatric): Secondary | ICD-10-CM | POA: Diagnosis not present

## 2017-10-21 DIAGNOSIS — G4733 Obstructive sleep apnea (adult) (pediatric): Secondary | ICD-10-CM | POA: Diagnosis not present

## 2017-10-23 DIAGNOSIS — G4733 Obstructive sleep apnea (adult) (pediatric): Secondary | ICD-10-CM | POA: Diagnosis not present

## 2017-11-09 DIAGNOSIS — B351 Tinea unguium: Secondary | ICD-10-CM | POA: Diagnosis not present

## 2017-11-09 DIAGNOSIS — M79676 Pain in unspecified toe(s): Secondary | ICD-10-CM | POA: Diagnosis not present

## 2017-11-23 DIAGNOSIS — G4733 Obstructive sleep apnea (adult) (pediatric): Secondary | ICD-10-CM | POA: Diagnosis not present

## 2017-12-07 DIAGNOSIS — R601 Generalized edema: Secondary | ICD-10-CM | POA: Diagnosis not present

## 2017-12-21 DIAGNOSIS — G4733 Obstructive sleep apnea (adult) (pediatric): Secondary | ICD-10-CM | POA: Diagnosis not present

## 2018-01-18 DIAGNOSIS — M79676 Pain in unspecified toe(s): Secondary | ICD-10-CM | POA: Diagnosis not present

## 2018-01-18 DIAGNOSIS — B351 Tinea unguium: Secondary | ICD-10-CM | POA: Diagnosis not present

## 2018-01-21 DIAGNOSIS — G4733 Obstructive sleep apnea (adult) (pediatric): Secondary | ICD-10-CM | POA: Diagnosis not present

## 2018-02-20 DIAGNOSIS — G4733 Obstructive sleep apnea (adult) (pediatric): Secondary | ICD-10-CM | POA: Diagnosis not present

## 2018-03-23 DIAGNOSIS — G4733 Obstructive sleep apnea (adult) (pediatric): Secondary | ICD-10-CM | POA: Diagnosis not present

## 2018-04-21 DIAGNOSIS — G4733 Obstructive sleep apnea (adult) (pediatric): Secondary | ICD-10-CM | POA: Diagnosis not present

## 2018-04-22 DIAGNOSIS — G4733 Obstructive sleep apnea (adult) (pediatric): Secondary | ICD-10-CM | POA: Diagnosis not present

## 2018-05-23 DIAGNOSIS — G4733 Obstructive sleep apnea (adult) (pediatric): Secondary | ICD-10-CM | POA: Diagnosis not present

## 2018-06-23 DIAGNOSIS — G4733 Obstructive sleep apnea (adult) (pediatric): Secondary | ICD-10-CM | POA: Diagnosis not present

## 2018-07-22 DIAGNOSIS — M79676 Pain in unspecified toe(s): Secondary | ICD-10-CM | POA: Diagnosis not present

## 2018-07-22 DIAGNOSIS — B351 Tinea unguium: Secondary | ICD-10-CM | POA: Diagnosis not present

## 2018-10-07 DIAGNOSIS — B351 Tinea unguium: Secondary | ICD-10-CM | POA: Diagnosis not present

## 2018-10-07 DIAGNOSIS — M79676 Pain in unspecified toe(s): Secondary | ICD-10-CM | POA: Diagnosis not present

## 2018-10-27 DIAGNOSIS — G4733 Obstructive sleep apnea (adult) (pediatric): Secondary | ICD-10-CM | POA: Diagnosis not present

## 2018-11-07 DIAGNOSIS — H43812 Vitreous degeneration, left eye: Secondary | ICD-10-CM | POA: Diagnosis not present

## 2018-12-15 DIAGNOSIS — I5032 Chronic diastolic (congestive) heart failure: Secondary | ICD-10-CM | POA: Diagnosis not present

## 2018-12-15 DIAGNOSIS — Z720 Tobacco use: Secondary | ICD-10-CM | POA: Insufficient documentation

## 2018-12-15 DIAGNOSIS — G4733 Obstructive sleep apnea (adult) (pediatric): Secondary | ICD-10-CM | POA: Insufficient documentation

## 2018-12-15 DIAGNOSIS — Z789 Other specified health status: Secondary | ICD-10-CM | POA: Insufficient documentation

## 2018-12-15 DIAGNOSIS — F1090 Alcohol use, unspecified, uncomplicated: Secondary | ICD-10-CM | POA: Insufficient documentation

## 2018-12-16 DIAGNOSIS — B351 Tinea unguium: Secondary | ICD-10-CM | POA: Diagnosis not present

## 2018-12-16 DIAGNOSIS — M79676 Pain in unspecified toe(s): Secondary | ICD-10-CM | POA: Diagnosis not present

## 2019-03-21 DIAGNOSIS — M79676 Pain in unspecified toe(s): Secondary | ICD-10-CM | POA: Diagnosis not present

## 2019-03-21 DIAGNOSIS — B351 Tinea unguium: Secondary | ICD-10-CM | POA: Diagnosis not present

## 2019-04-28 DIAGNOSIS — G4733 Obstructive sleep apnea (adult) (pediatric): Secondary | ICD-10-CM | POA: Diagnosis not present

## 2019-05-04 ENCOUNTER — Other Ambulatory Visit: Payer: Self-pay

## 2019-06-06 DIAGNOSIS — M79674 Pain in right toe(s): Secondary | ICD-10-CM | POA: Diagnosis not present

## 2019-06-06 DIAGNOSIS — B351 Tinea unguium: Secondary | ICD-10-CM | POA: Diagnosis not present

## 2019-08-15 DIAGNOSIS — M79674 Pain in right toe(s): Secondary | ICD-10-CM | POA: Diagnosis not present

## 2019-08-15 DIAGNOSIS — B351 Tinea unguium: Secondary | ICD-10-CM | POA: Diagnosis not present

## 2019-10-25 DIAGNOSIS — I10 Essential (primary) hypertension: Secondary | ICD-10-CM | POA: Diagnosis not present

## 2019-10-25 DIAGNOSIS — I110 Hypertensive heart disease with heart failure: Secondary | ICD-10-CM | POA: Diagnosis not present

## 2019-10-25 DIAGNOSIS — I119 Hypertensive heart disease without heart failure: Secondary | ICD-10-CM | POA: Diagnosis not present

## 2019-10-25 DIAGNOSIS — I482 Chronic atrial fibrillation, unspecified: Secondary | ICD-10-CM | POA: Diagnosis not present

## 2019-10-25 DIAGNOSIS — I480 Paroxysmal atrial fibrillation: Secondary | ICD-10-CM | POA: Diagnosis not present

## 2019-10-25 DIAGNOSIS — R0689 Other abnormalities of breathing: Secondary | ICD-10-CM | POA: Diagnosis not present

## 2019-10-25 DIAGNOSIS — Z87891 Personal history of nicotine dependence: Secondary | ICD-10-CM | POA: Diagnosis not present

## 2019-10-25 DIAGNOSIS — I471 Supraventricular tachycardia: Secondary | ICD-10-CM | POA: Diagnosis not present

## 2019-10-25 DIAGNOSIS — I4891 Unspecified atrial fibrillation: Secondary | ICD-10-CM | POA: Diagnosis not present

## 2019-10-25 DIAGNOSIS — I5032 Chronic diastolic (congestive) heart failure: Secondary | ICD-10-CM | POA: Diagnosis not present

## 2019-10-25 DIAGNOSIS — I491 Atrial premature depolarization: Secondary | ICD-10-CM | POA: Diagnosis not present

## 2019-10-25 DIAGNOSIS — F17210 Nicotine dependence, cigarettes, uncomplicated: Secondary | ICD-10-CM | POA: Diagnosis not present

## 2019-10-25 DIAGNOSIS — I499 Cardiac arrhythmia, unspecified: Secondary | ICD-10-CM | POA: Diagnosis not present

## 2019-10-25 DIAGNOSIS — Z79899 Other long term (current) drug therapy: Secondary | ICD-10-CM | POA: Diagnosis not present

## 2019-10-25 DIAGNOSIS — Z9114 Patient's other noncompliance with medication regimen: Secondary | ICD-10-CM | POA: Diagnosis not present

## 2019-10-25 DIAGNOSIS — R002 Palpitations: Secondary | ICD-10-CM | POA: Diagnosis not present

## 2019-10-26 DIAGNOSIS — F17210 Nicotine dependence, cigarettes, uncomplicated: Secondary | ICD-10-CM | POA: Diagnosis not present

## 2019-10-26 DIAGNOSIS — Z7982 Long term (current) use of aspirin: Secondary | ICD-10-CM | POA: Diagnosis not present

## 2019-10-26 DIAGNOSIS — I480 Paroxysmal atrial fibrillation: Secondary | ICD-10-CM | POA: Insufficient documentation

## 2019-10-26 DIAGNOSIS — I340 Nonrheumatic mitral (valve) insufficiency: Secondary | ICD-10-CM | POA: Diagnosis not present

## 2019-10-26 DIAGNOSIS — Z7289 Other problems related to lifestyle: Secondary | ICD-10-CM | POA: Diagnosis not present

## 2019-10-30 DIAGNOSIS — G4733 Obstructive sleep apnea (adult) (pediatric): Secondary | ICD-10-CM | POA: Diagnosis not present

## 2019-11-02 DIAGNOSIS — I471 Supraventricular tachycardia: Secondary | ICD-10-CM | POA: Diagnosis not present

## 2019-11-02 DIAGNOSIS — R0602 Shortness of breath: Secondary | ICD-10-CM | POA: Diagnosis not present

## 2019-11-02 DIAGNOSIS — R5381 Other malaise: Secondary | ICD-10-CM | POA: Diagnosis not present

## 2019-11-02 DIAGNOSIS — R000 Tachycardia, unspecified: Secondary | ICD-10-CM | POA: Diagnosis not present

## 2019-11-02 DIAGNOSIS — I4891 Unspecified atrial fibrillation: Secondary | ICD-10-CM | POA: Diagnosis not present

## 2019-11-02 DIAGNOSIS — F17210 Nicotine dependence, cigarettes, uncomplicated: Secondary | ICD-10-CM | POA: Diagnosis not present

## 2019-11-02 DIAGNOSIS — R0600 Dyspnea, unspecified: Secondary | ICD-10-CM | POA: Diagnosis not present

## 2019-11-07 DIAGNOSIS — I471 Supraventricular tachycardia: Secondary | ICD-10-CM | POA: Diagnosis not present

## 2019-11-07 DIAGNOSIS — Z6841 Body Mass Index (BMI) 40.0 and over, adult: Secondary | ICD-10-CM | POA: Diagnosis not present

## 2019-11-07 DIAGNOSIS — I4891 Unspecified atrial fibrillation: Secondary | ICD-10-CM | POA: Diagnosis not present

## 2019-11-17 DIAGNOSIS — I4891 Unspecified atrial fibrillation: Secondary | ICD-10-CM | POA: Diagnosis not present

## 2019-11-18 DIAGNOSIS — R000 Tachycardia, unspecified: Secondary | ICD-10-CM | POA: Diagnosis not present

## 2019-11-18 DIAGNOSIS — I4892 Unspecified atrial flutter: Secondary | ICD-10-CM | POA: Diagnosis not present

## 2019-11-18 DIAGNOSIS — I491 Atrial premature depolarization: Secondary | ICD-10-CM | POA: Diagnosis not present

## 2019-11-21 DIAGNOSIS — B351 Tinea unguium: Secondary | ICD-10-CM | POA: Diagnosis not present

## 2019-11-21 DIAGNOSIS — M79674 Pain in right toe(s): Secondary | ICD-10-CM | POA: Diagnosis not present

## 2019-12-13 DIAGNOSIS — Z720 Tobacco use: Secondary | ICD-10-CM | POA: Diagnosis not present

## 2019-12-13 DIAGNOSIS — Z7289 Other problems related to lifestyle: Secondary | ICD-10-CM | POA: Diagnosis not present

## 2019-12-13 DIAGNOSIS — F17200 Nicotine dependence, unspecified, uncomplicated: Secondary | ICD-10-CM | POA: Diagnosis not present

## 2019-12-13 DIAGNOSIS — G4733 Obstructive sleep apnea (adult) (pediatric): Secondary | ICD-10-CM | POA: Diagnosis not present

## 2019-12-13 DIAGNOSIS — I5022 Chronic systolic (congestive) heart failure: Secondary | ICD-10-CM | POA: Diagnosis not present

## 2019-12-13 DIAGNOSIS — I480 Paroxysmal atrial fibrillation: Secondary | ICD-10-CM | POA: Diagnosis not present

## 2019-12-13 DIAGNOSIS — I5032 Chronic diastolic (congestive) heart failure: Secondary | ICD-10-CM | POA: Diagnosis not present

## 2020-01-23 DIAGNOSIS — E669 Obesity, unspecified: Secondary | ICD-10-CM | POA: Diagnosis not present

## 2020-01-23 DIAGNOSIS — Z7901 Long term (current) use of anticoagulants: Secondary | ICD-10-CM | POA: Diagnosis not present

## 2020-01-23 DIAGNOSIS — F17210 Nicotine dependence, cigarettes, uncomplicated: Secondary | ICD-10-CM | POA: Diagnosis not present

## 2020-01-23 DIAGNOSIS — Z8249 Family history of ischemic heart disease and other diseases of the circulatory system: Secondary | ICD-10-CM | POA: Diagnosis not present

## 2020-01-23 DIAGNOSIS — Z6841 Body Mass Index (BMI) 40.0 and over, adult: Secondary | ICD-10-CM | POA: Diagnosis not present

## 2020-01-23 DIAGNOSIS — I480 Paroxysmal atrial fibrillation: Secondary | ICD-10-CM | POA: Diagnosis not present

## 2020-01-23 DIAGNOSIS — I5032 Chronic diastolic (congestive) heart failure: Secondary | ICD-10-CM | POA: Diagnosis not present

## 2020-01-23 DIAGNOSIS — I110 Hypertensive heart disease with heart failure: Secondary | ICD-10-CM | POA: Diagnosis not present

## 2020-01-23 DIAGNOSIS — Z20822 Contact with and (suspected) exposure to covid-19: Secondary | ICD-10-CM | POA: Diagnosis not present

## 2020-01-23 DIAGNOSIS — G4733 Obstructive sleep apnea (adult) (pediatric): Secondary | ICD-10-CM | POA: Diagnosis not present

## 2020-01-29 DIAGNOSIS — F17210 Nicotine dependence, cigarettes, uncomplicated: Secondary | ICD-10-CM | POA: Diagnosis not present

## 2020-01-29 DIAGNOSIS — Z8249 Family history of ischemic heart disease and other diseases of the circulatory system: Secondary | ICD-10-CM | POA: Diagnosis not present

## 2020-01-29 DIAGNOSIS — I5032 Chronic diastolic (congestive) heart failure: Secondary | ICD-10-CM | POA: Diagnosis not present

## 2020-01-29 DIAGNOSIS — I480 Paroxysmal atrial fibrillation: Secondary | ICD-10-CM | POA: Diagnosis not present

## 2020-01-29 DIAGNOSIS — Z9889 Other specified postprocedural states: Secondary | ICD-10-CM | POA: Diagnosis not present

## 2020-01-29 DIAGNOSIS — E669 Obesity, unspecified: Secondary | ICD-10-CM | POA: Diagnosis not present

## 2020-01-29 DIAGNOSIS — Z6841 Body Mass Index (BMI) 40.0 and over, adult: Secondary | ICD-10-CM | POA: Diagnosis not present

## 2020-01-29 DIAGNOSIS — I493 Ventricular premature depolarization: Secondary | ICD-10-CM | POA: Diagnosis not present

## 2020-01-29 DIAGNOSIS — Z5181 Encounter for therapeutic drug level monitoring: Secondary | ICD-10-CM | POA: Diagnosis not present

## 2020-01-29 DIAGNOSIS — I483 Typical atrial flutter: Secondary | ICD-10-CM | POA: Diagnosis not present

## 2020-01-29 DIAGNOSIS — Z7901 Long term (current) use of anticoagulants: Secondary | ICD-10-CM | POA: Diagnosis not present

## 2020-01-29 DIAGNOSIS — I491 Atrial premature depolarization: Secondary | ICD-10-CM | POA: Diagnosis not present

## 2020-01-29 DIAGNOSIS — F1020 Alcohol dependence, uncomplicated: Secondary | ICD-10-CM | POA: Diagnosis not present

## 2020-01-29 DIAGNOSIS — I110 Hypertensive heart disease with heart failure: Secondary | ICD-10-CM | POA: Diagnosis not present

## 2020-01-29 DIAGNOSIS — Z9989 Dependence on other enabling machines and devices: Secondary | ICD-10-CM | POA: Diagnosis not present

## 2020-01-29 DIAGNOSIS — I484 Atypical atrial flutter: Secondary | ICD-10-CM | POA: Diagnosis not present

## 2020-01-29 DIAGNOSIS — E782 Mixed hyperlipidemia: Secondary | ICD-10-CM | POA: Diagnosis not present

## 2020-01-29 DIAGNOSIS — I10 Essential (primary) hypertension: Secondary | ICD-10-CM | POA: Diagnosis not present

## 2020-01-29 DIAGNOSIS — I4891 Unspecified atrial fibrillation: Secondary | ICD-10-CM | POA: Diagnosis not present

## 2020-01-29 DIAGNOSIS — Z20822 Contact with and (suspected) exposure to covid-19: Secondary | ICD-10-CM | POA: Diagnosis not present

## 2020-01-29 DIAGNOSIS — G4733 Obstructive sleep apnea (adult) (pediatric): Secondary | ICD-10-CM | POA: Diagnosis not present

## 2020-02-20 DIAGNOSIS — B351 Tinea unguium: Secondary | ICD-10-CM | POA: Diagnosis not present

## 2020-02-20 DIAGNOSIS — M79674 Pain in right toe(s): Secondary | ICD-10-CM | POA: Diagnosis not present

## 2020-03-13 DIAGNOSIS — I480 Paroxysmal atrial fibrillation: Secondary | ICD-10-CM | POA: Diagnosis not present

## 2020-03-13 DIAGNOSIS — I110 Hypertensive heart disease with heart failure: Secondary | ICD-10-CM | POA: Diagnosis not present

## 2020-03-13 DIAGNOSIS — G4733 Obstructive sleep apnea (adult) (pediatric): Secondary | ICD-10-CM | POA: Diagnosis not present

## 2020-03-13 DIAGNOSIS — Z9889 Other specified postprocedural states: Secondary | ICD-10-CM | POA: Diagnosis not present

## 2020-03-13 DIAGNOSIS — I959 Hypotension, unspecified: Secondary | ICD-10-CM | POA: Diagnosis not present

## 2020-03-13 DIAGNOSIS — F1020 Alcohol dependence, uncomplicated: Secondary | ICD-10-CM | POA: Diagnosis not present

## 2020-03-13 DIAGNOSIS — I498 Other specified cardiac arrhythmias: Secondary | ICD-10-CM | POA: Diagnosis not present

## 2020-03-13 DIAGNOSIS — Z7901 Long term (current) use of anticoagulants: Secondary | ICD-10-CM | POA: Diagnosis not present

## 2020-03-13 DIAGNOSIS — F17200 Nicotine dependence, unspecified, uncomplicated: Secondary | ICD-10-CM | POA: Diagnosis not present

## 2020-03-13 DIAGNOSIS — Z79899 Other long term (current) drug therapy: Secondary | ICD-10-CM | POA: Diagnosis not present

## 2020-03-13 DIAGNOSIS — I5042 Chronic combined systolic (congestive) and diastolic (congestive) heart failure: Secondary | ICD-10-CM | POA: Diagnosis not present

## 2020-04-29 DIAGNOSIS — G4733 Obstructive sleep apnea (adult) (pediatric): Secondary | ICD-10-CM | POA: Diagnosis not present

## 2020-05-07 DIAGNOSIS — B351 Tinea unguium: Secondary | ICD-10-CM | POA: Diagnosis not present

## 2020-05-07 DIAGNOSIS — M79674 Pain in right toe(s): Secondary | ICD-10-CM | POA: Diagnosis not present

## 2020-07-26 DIAGNOSIS — B351 Tinea unguium: Secondary | ICD-10-CM | POA: Diagnosis not present

## 2020-07-26 DIAGNOSIS — M79674 Pain in right toe(s): Secondary | ICD-10-CM | POA: Diagnosis not present

## 2020-10-11 DIAGNOSIS — M79674 Pain in right toe(s): Secondary | ICD-10-CM | POA: Diagnosis not present

## 2020-10-11 DIAGNOSIS — B351 Tinea unguium: Secondary | ICD-10-CM | POA: Diagnosis not present

## 2020-10-31 DIAGNOSIS — G4733 Obstructive sleep apnea (adult) (pediatric): Secondary | ICD-10-CM | POA: Diagnosis not present

## 2020-11-05 DIAGNOSIS — I4891 Unspecified atrial fibrillation: Secondary | ICD-10-CM | POA: Diagnosis not present

## 2020-12-20 DIAGNOSIS — M79674 Pain in right toe(s): Secondary | ICD-10-CM | POA: Diagnosis not present

## 2020-12-20 DIAGNOSIS — B351 Tinea unguium: Secondary | ICD-10-CM | POA: Diagnosis not present

## 2021-02-17 DIAGNOSIS — R7309 Other abnormal glucose: Secondary | ICD-10-CM | POA: Diagnosis not present

## 2021-02-17 DIAGNOSIS — R739 Hyperglycemia, unspecified: Secondary | ICD-10-CM | POA: Diagnosis not present

## 2021-02-17 DIAGNOSIS — Z79899 Other long term (current) drug therapy: Secondary | ICD-10-CM | POA: Diagnosis not present

## 2021-02-17 DIAGNOSIS — Z13220 Encounter for screening for lipoid disorders: Secondary | ICD-10-CM | POA: Diagnosis not present

## 2021-02-17 DIAGNOSIS — D485 Neoplasm of uncertain behavior of skin: Secondary | ICD-10-CM | POA: Diagnosis not present

## 2021-02-17 DIAGNOSIS — G4733 Obstructive sleep apnea (adult) (pediatric): Secondary | ICD-10-CM | POA: Diagnosis not present

## 2021-02-17 DIAGNOSIS — I4891 Unspecified atrial fibrillation: Secondary | ICD-10-CM | POA: Diagnosis not present

## 2021-02-17 DIAGNOSIS — I5032 Chronic diastolic (congestive) heart failure: Secondary | ICD-10-CM | POA: Diagnosis not present

## 2021-02-17 DIAGNOSIS — Z6841 Body Mass Index (BMI) 40.0 and over, adult: Secondary | ICD-10-CM | POA: Diagnosis not present

## 2021-03-14 DIAGNOSIS — B351 Tinea unguium: Secondary | ICD-10-CM | POA: Diagnosis not present

## 2021-03-14 DIAGNOSIS — M79674 Pain in right toe(s): Secondary | ICD-10-CM | POA: Diagnosis not present

## 2021-05-01 DIAGNOSIS — G4733 Obstructive sleep apnea (adult) (pediatric): Secondary | ICD-10-CM | POA: Diagnosis not present

## 2021-05-23 DIAGNOSIS — B351 Tinea unguium: Secondary | ICD-10-CM | POA: Diagnosis not present

## 2021-05-23 DIAGNOSIS — M79674 Pain in right toe(s): Secondary | ICD-10-CM | POA: Diagnosis not present

## 2021-05-26 DIAGNOSIS — R739 Hyperglycemia, unspecified: Secondary | ICD-10-CM | POA: Diagnosis not present

## 2021-05-26 DIAGNOSIS — R7309 Other abnormal glucose: Secondary | ICD-10-CM | POA: Diagnosis not present

## 2021-08-01 DIAGNOSIS — B351 Tinea unguium: Secondary | ICD-10-CM | POA: Diagnosis not present

## 2021-08-01 DIAGNOSIS — M79674 Pain in right toe(s): Secondary | ICD-10-CM | POA: Diagnosis not present

## 2021-10-09 DIAGNOSIS — B351 Tinea unguium: Secondary | ICD-10-CM | POA: Diagnosis not present

## 2021-10-09 DIAGNOSIS — M79674 Pain in right toe(s): Secondary | ICD-10-CM | POA: Diagnosis not present

## 2021-11-02 DIAGNOSIS — G4733 Obstructive sleep apnea (adult) (pediatric): Secondary | ICD-10-CM | POA: Diagnosis not present

## 2021-12-25 DIAGNOSIS — B351 Tinea unguium: Secondary | ICD-10-CM | POA: Diagnosis not present

## 2021-12-25 DIAGNOSIS — M79674 Pain in right toe(s): Secondary | ICD-10-CM | POA: Diagnosis not present

## 2022-01-19 ENCOUNTER — Other Ambulatory Visit (HOSPITAL_COMMUNITY): Payer: Self-pay | Admitting: Internal Medicine

## 2022-01-19 ENCOUNTER — Ambulatory Visit (HOSPITAL_COMMUNITY)
Admission: RE | Admit: 2022-01-19 | Discharge: 2022-01-19 | Disposition: A | Discharge status: Home / Self Care | Payer: PPO | Source: Ambulatory Visit | Attending: Internal Medicine | Admitting: Internal Medicine

## 2022-01-19 DIAGNOSIS — I4891 Unspecified atrial fibrillation: Secondary | ICD-10-CM | POA: Diagnosis not present

## 2022-01-19 DIAGNOSIS — Z0001 Encounter for general adult medical examination with abnormal findings: Secondary | ICD-10-CM | POA: Diagnosis not present

## 2022-01-19 DIAGNOSIS — R059 Cough, unspecified: Secondary | ICD-10-CM

## 2022-01-19 DIAGNOSIS — R042 Hemoptysis: Secondary | ICD-10-CM | POA: Diagnosis not present

## 2022-01-19 DIAGNOSIS — I517 Cardiomegaly: Secondary | ICD-10-CM | POA: Diagnosis not present

## 2022-01-19 DIAGNOSIS — J40 Bronchitis, not specified as acute or chronic: Secondary | ICD-10-CM | POA: Diagnosis not present

## 2022-01-21 ENCOUNTER — Other Ambulatory Visit (HOSPITAL_COMMUNITY): Payer: Self-pay | Admitting: Internal Medicine

## 2022-01-21 ENCOUNTER — Ambulatory Visit (HOSPITAL_COMMUNITY)
Admission: RE | Admit: 2022-01-21 | Discharge: 2022-01-21 | Disposition: A | Discharge status: Home / Self Care | Payer: PPO | Source: Ambulatory Visit | Attending: Internal Medicine | Admitting: Internal Medicine

## 2022-01-21 ENCOUNTER — Other Ambulatory Visit: Payer: Self-pay | Admitting: Internal Medicine

## 2022-01-21 DIAGNOSIS — Z8709 Personal history of other diseases of the respiratory system: Secondary | ICD-10-CM

## 2022-01-21 DIAGNOSIS — R042 Hemoptysis: Secondary | ICD-10-CM

## 2022-01-21 DIAGNOSIS — J9859 Other diseases of mediastinum, not elsewhere classified: Secondary | ICD-10-CM

## 2022-01-21 DIAGNOSIS — I3139 Other pericardial effusion (noninflammatory): Secondary | ICD-10-CM | POA: Diagnosis not present

## 2022-01-21 DIAGNOSIS — J439 Emphysema, unspecified: Secondary | ICD-10-CM | POA: Diagnosis not present

## 2022-01-21 MED ORDER — IOHEXOL 300 MG/ML  SOLN
75.0000 mL | Freq: Once | INTRAMUSCULAR | Status: AC | PRN
  Administered 2022-01-21: 75 mL via INTRAVENOUS

## 2022-01-22 ENCOUNTER — Other Ambulatory Visit (HOSPITAL_COMMUNITY): Payer: Self-pay

## 2022-01-22 DIAGNOSIS — C3490 Malignant neoplasm of unspecified part of unspecified bronchus or lung: Secondary | ICD-10-CM

## 2022-01-22 DIAGNOSIS — C3411 Malignant neoplasm of upper lobe, right bronchus or lung: Secondary | ICD-10-CM | POA: Insufficient documentation

## 2022-01-22 DIAGNOSIS — C3491 Malignant neoplasm of unspecified part of right bronchus or lung: Secondary | ICD-10-CM | POA: Insufficient documentation

## 2022-01-22 LAB — POCT I-STAT CREATININE: Creatinine, Ser: 1.1 mg/dL (ref 0.61–1.24)

## 2022-01-22 NOTE — Progress Notes (Signed)
Orders placed for PET scan and brain MRI per verbal order from Dr. Delton Coombes ?

## 2022-01-23 ENCOUNTER — Inpatient Hospital Stay (HOSPITAL_COMMUNITY): Payer: PPO | Attending: Hematology | Admitting: Hematology

## 2022-01-23 DIAGNOSIS — R590 Localized enlarged lymph nodes: Secondary | ICD-10-CM | POA: Insufficient documentation

## 2022-01-23 DIAGNOSIS — I10 Essential (primary) hypertension: Secondary | ICD-10-CM | POA: Insufficient documentation

## 2022-01-23 DIAGNOSIS — R918 Other nonspecific abnormal finding of lung field: Secondary | ICD-10-CM | POA: Insufficient documentation

## 2022-01-23 DIAGNOSIS — R042 Hemoptysis: Secondary | ICD-10-CM | POA: Diagnosis not present

## 2022-01-23 DIAGNOSIS — R058 Other specified cough: Secondary | ICD-10-CM | POA: Insufficient documentation

## 2022-01-23 DIAGNOSIS — C3491 Malignant neoplasm of unspecified part of right bronchus or lung: Secondary | ICD-10-CM

## 2022-01-23 DIAGNOSIS — Z87891 Personal history of nicotine dependence: Secondary | ICD-10-CM | POA: Diagnosis not present

## 2022-01-23 NOTE — Progress Notes (Signed)
? ?Larry Moore ?[address] ?[place], [place] [address] ? ? ?CLINIC:  ?Medical Oncology/Hematology ? ?Patient Care Team: ?Redmond School, MD as PCP - General (Internal Medicine) ?[name], MD as Medical Oncologist (Medical Oncology) ?Brien Mates, RN as Oncology Nurse Navigator (Medical Oncology) ? ?CHIEF COMPLAINTS/PURPOSE OF CONSULTATION:  ?Evaluation of right lung mass and mediastinal lymphadenopathy ? ?HISTORY OF PRESENTING ILLNESS:  ?Larry Moore 71 y.o. male is here because of evaluation of lung mass, at the request of Larry Moore. ? ?Chest x-ray on 01/19/2022 for productive cough showed right hilar mass.  CT chest with contrast on 01/21/2022 showed large right hilar mass with mediastinal adenopathy.  Today he report feeling good. He reports productive cough and hemoptysis over the past 2-3 weeks. He stopped Eliquis 2 weeks ago at which time the hemoptysis resolved, but he still reports productive cough. He denies weight loss, CP, and chest pressure. He started Levaquin 2 days ago. He takes Lasix twice weekly. He denies ankle swellings and neuropathy.  ? ?He has been retired for 8 years. He smokes 1 ppd and has smoked for over 50 years. His father had renal cell cancer.  ? ?MEDICAL HISTORY:  ?Past Medical History:  ?Diagnosis Date  ? Hypertension   ? Insomnia   ? ? ?SURGICAL HISTORY: ? ? ?SOCIAL HISTORY: ?Social History  ? ?Socioeconomic History  ? Marital status: Married  ?  Spouse name: Not on file  ? Number of children: Not on file  ? Years of education: Not on file  ? Highest education level: Not on file  ?Occupational History  ? Occupation: Retired  ?Tobacco Use  ? Smoking status: Former  ? Smokeless tobacco: Not on file  ? Tobacco comments:  ?  Quit 2015  ?Substance and Sexual Activity  ? Alcohol use: Yes  ?  Alcohol/week: 0.0 standard drinks  ?  Comment: 1 bottle wine a day   ? Drug use: No  ? Sexual activity: Not on file  ?Other Topics Concern  ? Not on file  ?Social  History Narrative  ? Not on file  ? ?Social Determinants of Health  ? ?Financial Resource Strain: Not on file  ?Food Insecurity: Not on file  ?Transportation Needs: Not on file  ?Physical Activity: Not on file  ?Stress: Not on file  ?Social Connections: Not on file  ?Intimate Partner Violence: Not on file  ? ? ?FAMILY HISTORY: ?Family History  ?Problem Relation Age of Onset  ? Coronary artery disease Father   ? Diabetes Father   ? ? ?ALLERGIES:  has No Known Allergies. ? ?MEDICATIONS:  ?Current Outpatient Medications  ?Medication Sig Dispense Refill  ? ELIQUIS 5 MG TABS tablet Take 5 mg by mouth 2 (two) times daily.    ? furosemide (LASIX) 80 MG tablet Take 80 mg by mouth.    ? levofloxacin (LEVAQUIN) 500 MG tablet Take 500 mg by mouth daily.    ? metolazone (ZAROXOLYN) 2.5 MG tablet Take 2.5 mg by mouth daily.    ? olmesartan (BENICAR) 40 MG tablet Take 40 mg by mouth daily.    ? Vitamin D, Ergocalciferol, (DRISDOL) 1.25 MG (50000 UNIT) CAPS capsule Take by mouth.    ? zolpidem (AMBIEN) 5 MG tablet Take 5 mg by mouth at bedtime as needed for sleep.    ? ?No current facility-administered medications for this visit.  ? ? ?REVIEW OF SYSTEMS:   ?Review of Systems  ?Constitutional:  Negative for appetite change, fatigue and  unexpected weight change.  ?Respiratory:  Positive for cough and shortness of breath. Negative for hemoptysis (resolved).   ?Cardiovascular:  Positive for palpitations. Negative for chest pain and leg swelling.  ?Neurological:  Negative for numbness.  ?All other systems reviewed and are negative. ? ? ?PHYSICAL EXAMINATION: ?ECOG PERFORMANCE STATUS: 1 - Symptomatic but completely ambulatory ? ?Vitals:  ? 01/23/22 1334  ?BP: 108/63  ?Pulse: 63  ?Resp: 20  ?Temp: 97.9 ?F (36.6 ?C)  ?SpO2: 97%  ? ?Filed Weights  ? 01/23/22 1334  ?Weight: 245 lb 8 oz (111.4 kg)  ? ?Physical Exam ?Vitals reviewed.  ?Constitutional:   ?   Appearance: Normal appearance. He is obese.  ?Cardiovascular:  ?   Rate and Rhythm:  Normal rate and regular rhythm.  ?   Pulses: Normal pulses.  ?   Heart sounds: Normal heart sounds.  ?Pulmonary:  ?   Effort: Pulmonary effort is normal.  ?   Breath sounds: Normal breath sounds.  ?Abdominal:  ?   Palpations: Abdomen is soft. There is no hepatomegaly, splenomegaly or mass.  ?   Tenderness: There is no abdominal tenderness.  ?Lymphadenopathy:  ?   Cervical: No cervical adenopathy.  ?   Right cervical: No superficial, deep or posterior cervical adenopathy. ?   Left cervical: No superficial, deep or posterior cervical adenopathy.  ?   Upper Body:  ?   Right upper body: No supraclavicular or axillary adenopathy.  ?   Left upper body: No supraclavicular or axillary adenopathy.  ?   Lower Body: No right inguinal adenopathy. No left inguinal adenopathy.  ?Neurological:  ?   General: No focal deficit present.  ?   Mental Status: He is alert and oriented to person, place, and time.  ?Psychiatric:     ?   Mood and Affect: Mood normal.     ?   Behavior: Behavior normal.  ? ? ? ?LABORATORY DATA:  ?I have reviewed the data as listed ?Recent Results (from the past 2160 hour(s))  ?I-STAT creatinine     Status: None  ? Collection Time: 01/21/22  2:11 PM  ?Result Value Ref Range  ? Creatinine, Ser 1.10 0.61 - 1.24 mg/dL  ? ? ?RADIOGRAPHIC STUDIES: ?I have personally reviewed the radiological images as listed and agreed with the findings in the report. ?DG Chest 2 View ? ?Result Date: 01/20/2022 ?CLINICAL DATA:  Productive cough.  Ex-smoker. EXAM: CHEST - 2 VIEW COMPARISON:  12/06/2014 FINDINGS: Midline trachea. Mild cardiomegaly. Mild hyperinflation. Right hilar soft tissue fullness is most apparent on the frontal radiograph. No pleural effusion or pneumothorax. Diffuse peribronchial thickening. Possible right lower lobe pulmonary opacity, only readily apparent on the lateral view. Left base scarring. IMPRESSION: Right hilar soft tissue fullness, suspicious for adenopathy or mass. Airspace disease/pneumonia felt less  likely. Consider further evaluation with contrast enhanced chest CT. Equivocal right base opacity which could represent minimal airspace disease or possibly a pulmonary nodule. This would be better evaluated with chest CT. Underlying hyperinflation and interstitial thickening, likely related to COPD/chronic bronchitis. These results will be called to the ordering clinician or representative by the Radiologist Assistant, and communication documented in the PACS or [employer]. Electronically Signed   By: Abigail Miyamoto M.D.   On: 01/20/2022 15:04  ? ?CT CHEST W CONTRAST ? ?Result Date: 01/21/2022 ?CLINICAL DATA:  Hemoptysis.  Abnormal chest x-ray. EXAM: CT CHEST WITH CONTRAST TECHNIQUE: Multidetector CT imaging of the chest was performed during intravenous contrast administration. RADIATION DOSE REDUCTION:  This exam was performed according to the departmental dose-optimization program which includes automated exposure control, adjustment of the mA and/or kV according to patient size and/or use of iterative reconstruction technique. CONTRAST:  68mL OMNIPAQUE IOHEXOL 300 MG/ML  SOLN COMPARISON:  Chest x-ray 01/19/2022 FINDINGS: Cardiovascular: The heart is normal in size. Small pericardial effusion. The aorta is normal in caliber. Scattered atherosclerotic calcifications but no focal aneurysm or dissection. There are advanced three-vessel coronary artery calcifications and probable LAD stent. Mediastinum/Nodes: Large (6.5 x 5.2 cm) right hilar mass surrounding and compressing the right upper lobe bronchus and the bronchus intermedius. There is also compression of the right middle lobe and left lower lobe bronchi. There is also compression of the right lower lobe pulmonary artery. 17 mm right paratracheal node on image number 53/2 and 14 mm right paratracheal node on image number 43/2. Subcarinal node measures 25 mm on image 72/2. Lungs/Pleura: Nodular soft tissue lesion in the right lower lobe is continuous with the  right hilar mass and could represent endobronchial spread of tumor or possibly the patient's primary lung cancer. Underlying emphysematous changes are noted. No pulmonary nodules to suggest pulmonary metas

## 2022-01-23 NOTE — Patient Instructions (Addendum)
[place] at [facility] ?Discharge Instructions ? ?You were seen and examined today by Dr. Delton Coombes. Dr. Delton Coombes is a medical oncologist, meaning that he specializes in the treatment of cancer diagnoses. Dr. Delton Coombes discussed your past medical history, family history of cancers, and the events that led to you being here today. ? ?Dr. Gerarda Fraction has referred you to Dr. Delton Coombes due to an abnormal CT of your chest that revealed a mass and enlarged lymph nodes concerning for cancer arising from the lungs.  ? ?Dr. Delton Coombes has recommended a PET scan and a brain MRI to complete the work-up. A PET scan is a specialized CT scan that illuminates where there is cancer present in the body. A brain MRI is done to complete the staging process for all patients with a lung mass. ? ?Dr. Delton Coombes has also recommended a referral to a cardiothoracic surgeon for a biopsy and surgical evaluation. ? ?Follow-up as scheduled with Dr. Delton Coombes. ? ? ?Thank you for choosing [place] at [facility] to provide your oncology and hematology care.  To afford each patient quality time with our provider, please arrive at least 15 minutes before your scheduled appointment time.  ? ?If you have a lab appointment with the [facility] please come in thru the Main Entrance and check in at the main information desk. ? ?You need to re-schedule your appointment should you arrive 10 or more minutes late.  We strive to give you quality time with our providers, and arriving late affects you and other patients whose appointments are after yours.  Also, if you no show three or more times for appointments you may be dismissed from the clinic at the providers discretion.     ?Again, thank you for choosing [facility].  Our hope is that these requests will decrease the amount of time that you wait before being seen by our physicians.        ?_____________________________________________________________ ? ?Should you have questions after your visit to [facility], please contact our office at [phone] and follow the prompts.  Our office hours are 8:00 a.m. and 4:30 p.m. Monday - Friday.  Please note that voicemails left after 4:00 p.m. may not be returned until the following business day.  We are closed weekends and major holidays.  You do have access to a nurse 24-7, just call the main number to the clinic [phone] and do not press any options, hold on the line and a nurse will answer the phone.   ? ?For prescription refill requests, have your pharmacy contact our office and allow 72 hours.   ? ?Due to Covid, you will need to wear a mask upon entering the hospital. If you do not have a mask, a mask will be given to you at the Main Entrance upon arrival. For doctor visits, patients may have 1 support person age 19 or older with them. For treatment visits, patients can not have anyone with them due to social distancing guidelines and our immunocompromised population.  ? ? ? ?

## 2022-01-24 ENCOUNTER — Telehealth: Payer: Self-pay | Admitting: Pulmonary Disease

## 2022-01-24 ENCOUNTER — Ambulatory Visit (HOSPITAL_COMMUNITY)
Admission: RE | Admit: 2022-01-24 | Discharge: 2022-01-24 | Disposition: A | Discharge status: Home / Self Care | Payer: PPO | Source: Ambulatory Visit | Attending: Hematology | Admitting: Hematology

## 2022-01-24 DIAGNOSIS — C3490 Malignant neoplasm of unspecified part of unspecified bronchus or lung: Secondary | ICD-10-CM | POA: Diagnosis not present

## 2022-01-24 DIAGNOSIS — J329 Chronic sinusitis, unspecified: Secondary | ICD-10-CM | POA: Diagnosis not present

## 2022-01-24 DIAGNOSIS — I639 Cerebral infarction, unspecified: Secondary | ICD-10-CM | POA: Diagnosis not present

## 2022-01-24 DIAGNOSIS — R042 Hemoptysis: Secondary | ICD-10-CM | POA: Diagnosis not present

## 2022-01-24 MED ORDER — GADOBUTROL 1 MMOL/ML IV SOLN
10.0000 mL | Freq: Once | INTRAVENOUS | Status: AC | PRN
  Administered 2022-01-24: 10 mL via INTRAVENOUS

## 2022-01-24 NOTE — Telephone Encounter (Signed)
PCCM ? ?Please schedule patient for Monday 01/26/2022 in my 330 slot.  ?I called and spoke with patient today.  ?He is aware that I will see him on Monday.  ?Please call and confirm with him on Monday morning and ensure that he is on my schedule for the 330 slot.  ? ?Thanks ? ?BLI  ? ?Garner Nash, DO ?[facility] ?01/24/2022 4:25 PM   ? ?

## 2022-01-24 NOTE — Telephone Encounter (Signed)
-----   Message from [name], MD sent at 01/23/2022  2:28 PM EDT ----- ?Brad, ?I am sending [name] your way for abnormal findings on the CT scan with the right lung mass and mediastinal lymphadenopathy.  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  I appreciate if you could see him and do a bronchoscopy and biopsy. ?Thanks, ?Sree ? ?

## 2022-01-26 ENCOUNTER — Encounter: Payer: Self-pay | Admitting: Pulmonary Disease

## 2022-01-26 ENCOUNTER — Ambulatory Visit: Payer: PPO | Admitting: Pulmonary Disease

## 2022-01-26 VITALS — BP 136/68 | HR 70 | Temp 98.4°F | Ht 68.0 in | Wt 242.0 lb

## 2022-01-26 DIAGNOSIS — R918 Other nonspecific abnormal finding of lung field: Secondary | ICD-10-CM

## 2022-01-26 DIAGNOSIS — R599 Enlarged lymph nodes, unspecified: Secondary | ICD-10-CM | POA: Diagnosis present

## 2022-01-26 DIAGNOSIS — Z720 Tobacco use: Secondary | ICD-10-CM | POA: Diagnosis not present

## 2022-01-26 DIAGNOSIS — R042 Hemoptysis: Secondary | ICD-10-CM

## 2022-01-26 NOTE — Telephone Encounter (Signed)
Called and spoke with the [t  ?He is scheduled for 3:30 today with Dr. Valeta Harms  ?I advised him to arrive at 3:15 for check in  ?Nothing further needed ?

## 2022-01-26 NOTE — Progress Notes (Signed)
? ?Synopsis: Referred in April 2023 for lung mass by Redmond School, MD ? ?Subjective:  ? ?PATIENT ID: Larry Moore GENDER: male DOB: 1951/06/02, MRN: [id number] ? ?Chief Complaint  ?Patient presents with  ? Consult  ?  Consult for lung mass. Pt states he does have some coughing, SOB and hemoptysis at time. Pt states this has been occurring for a few weeks.   ? ? ?This is a 71 year old gentleman, Past medical history of hypertension, longstanding history of tobacco use.  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  Presents with increased cough sputum production.  He stopped taking his Eliquis due to hemoptysis.  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  Patient was referred today to discuss bronchoscopy and tissue sampling.  Patient is agreeable to proceed. ? ? ? ? ?Past Medical History:  ?Diagnosis Date  ? Hypertension   ? Insomnia   ?  ? ?Family History  ?Problem Relation Age of Onset  ? Coronary artery disease Father   ? Diabetes Father   ?  ? ?Past Surgical History:  ?Procedure Laterality Date  ? HERNIA REPAIR    ? ? ?Social History  ? ?Socioeconomic History  ? Marital status: Married  ?  Spouse name: Not on file  ? Number of children: Not on file  ? Years of education: Not on file  ? Highest education level: Not on file  ?Occupational History  ? Occupation: Retired  ?Tobacco Use  ? Smoking status: Every Day  ?  Types: Cigarettes  ?  Passive exposure: Current  ? Smokeless tobacco: Not on file  ? Tobacco comments:  ?  Pt smokes 1 ppd. 01/26/22  ?Substance and Sexual Activity  ? Alcohol use: Yes  ?  Alcohol/week: 0.0 standard drinks  ?  Comment: 1 bottle wine a day   ? Drug use: No  ? Sexual activity: Not on file  ?Other Topics Concern  ? Not on file  ?Social History Narrative  ? Not on file  ? ?Social Determinants of Health  ? ?Financial Resource Strain: Not on file   ?Food Insecurity: Not on file  ?Transportation Needs: Not on file  ?Physical Activity: Not on file  ?Stress: Not on file  ?Social Connections: Not on file  ?Intimate Partner Violence: Not on file  ?  ? ?No Known Allergies  ? ?Outpatient Medications Prior to Visit  ?Medication Sig Dispense Refill  ? ELIQUIS 5 MG TABS tablet Take 5 mg by mouth 2 (two) times daily.    ? furosemide (LASIX) 80 MG tablet Take 80 mg by mouth.    ? levofloxacin (LEVAQUIN) 500 MG tablet Take 500 mg by mouth daily.    ? metolazone (ZAROXOLYN) 2.5 MG tablet Take 2.5 mg by mouth daily.    ? olmesartan (BENICAR) 40 MG tablet Take 40 mg by mouth daily.    ? Vitamin D, Ergocalciferol, (DRISDOL) 1.25 MG (50000 UNIT) CAPS capsule Take by mouth.    ? zolpidem (AMBIEN) 5 MG tablet Take 5 mg by mouth at bedtime as needed for sleep.    ? ?No facility-administered medications prior to visit.  ? ? ?Review of Systems  ?Constitutional:  Negative for chills, fever, malaise/fatigue and weight loss.  ?HENT:  Negative for hearing loss, sore throat and tinnitus.   ?Eyes:  Negative for blurred vision and double vision.  ?Respiratory:  Positive for cough, sputum  production and shortness of breath. Negative for hemoptysis, wheezing and stridor.   ?Cardiovascular:  Negative for chest pain, palpitations, orthopnea, leg swelling and PND.  ?Gastrointestinal:  Negative for abdominal pain, constipation, diarrhea, heartburn, nausea and vomiting.  ?Genitourinary:  Negative for dysuria, hematuria and urgency.  ?Musculoskeletal:  Negative for joint pain and myalgias.  ?Skin:  Negative for itching and rash.  ?Neurological:  Negative for dizziness, tingling, weakness and headaches.  ?Endo/Heme/Allergies:  Negative for environmental allergies. Does not bruise/bleed easily.  ?Psychiatric/Behavioral:  Negative for depression. The patient is not nervous/anxious and does not have insomnia.   ?All other systems reviewed and are negative. ? ? ?Objective:  ?Physical Exam ?Vitals  reviewed.  ?Constitutional:   ?   General: He is not in acute distress. ?   Appearance: He is well-developed. He is obese.  ?HENT:  ?   Head: Normocephalic and atraumatic.  ?Eyes:  ?   General: No scleral icterus. ?   Conjunctiva/sclera: Conjunctivae normal.  ?   Pupils: Pupils are equal, round, and reactive to light.  ?Neck:  ?   Vascular: No JVD.  ?   Trachea: No tracheal deviation.  ?Cardiovascular:  ?   Rate and Rhythm: Normal rate and regular rhythm.  ?   Heart sounds: Normal heart sounds. No murmur heard. ?Pulmonary:  ?   Effort: Pulmonary effort is normal. No tachypnea, accessory muscle usage or respiratory distress.  ?   Breath sounds: No stridor. Rhonchi present. No wheezing.  ?Abdominal:  ?   General: There is no distension.  ?   Palpations: Abdomen is soft.  ?   Tenderness: There is no abdominal tenderness.  ?Musculoskeletal:     ?   General: No tenderness.  ?   Cervical back: Neck supple.  ?   Right lower leg: Edema present.  ?   Left lower leg: Edema present.  ?Lymphadenopathy:  ?   Cervical: No cervical adenopathy.  ?Skin: ?   General: Skin is warm and dry.  ?   Capillary Refill: Capillary refill takes less than 2 seconds.  ?   Findings: No rash.  ?Neurological:  ?   Mental Status: He is alert and oriented to person, place, and time.  ?Psychiatric:     ?   Behavior: Behavior normal.  ? ? ? ?Vitals:  ? 01/26/22 1524  ?BP: 136/68  ?Pulse: 70  ?Temp: 98.4 ?F (36.9 ?C)  ?TempSrc: Oral  ?SpO2: 94%  ?Weight: 242 lb (109.8 kg)  ?Height: 5\' 8"  (1.727 m)  ? ?94% on RA ?BMI Readings from Last 3 Encounters:  ?01/26/22 36.80 kg/m?  ?01/23/22 37.33 kg/m?  ?12/24/14 38.47 kg/m?  ? ?Wt Readings from Last 3 Encounters:  ?01/26/22 242 lb (109.8 kg)  ?01/23/22 245 lb 8 oz (111.4 kg)  ?12/24/14 253 lb (114.8 kg)  ? ? ? ?CBC ?No results found for: WBC, RBC, HGB, HCT, PLT, MCV, MCH, MCHC, RDW, LYMPHSABS, MONOABS, EOSABS, BASOSABS ? ? ? ?Chest Imaging: ? 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 ?The patient's images have been independently reviewed by me.   ? ?Pulmonary Functions Testing Results: ?   ? View : No data to display.  ?  ?  ?  ? ? ?FeNO:  ? ?Pathology:  ? ?Echocardiogram:  ? ?Heart Catheterization:  ?   ?Assessment & Plan:  ? ?  ICD-10-CM   ?1. Lung mass  R91.8 Procedural/ Surgical Case Request: VIDEO BRONCHOSCOPY  WITH ENDOBRONCHIAL ULTRASOUND  ?  Ambulatory referral to Pulmonology  ?  Procedural/ Surgical Case Request: VIDEO BRONCHOSCOPY WITH ENDOBRONCHIAL ULTRASOUND  ?  ?2. Adenopathy  R59.9 Procedural/ Surgical Case Request: VIDEO BRONCHOSCOPY WITH ENDOBRONCHIAL ULTRASOUND  ?  Ambulatory referral to Pulmonology  ?  Procedural/ Surgical Case Request: VIDEO BRONCHOSCOPY WITH ENDOBRONCHIAL ULTRASOUND  ?  ?3. Tobacco use  Z72.0   ?  ?4. Hemoptysis  R04.2   ?  ? ? ?Discussion: ? ?This is a 71 year old gentleman, longstanding history of tobacco use, recent hemoptysis, cough sputum production, CT scan concern for right-sided lung mass with associated adenopathy concern for advanced stage bronchogenic carcinoma. ? ?Plan: ?Today in the office we discussed the risk benefits and alternatives proceeding with bronchoscopy and tissue sampling. ?We talked about the risk of bleeding and pneumothorax. ?Patient is agreeable to proceed. ?He does have a PET scan scheduled for Thursday afternoon which we will need to reschedule. ?We will plan to do bronchoscopy at [facility] on 01/29/2022. ?Patient is agreeable to proceed. ?I have called and spoke with the OR as endoscopy is unable to accommodate any additional cases on Thursday due to staffing. ?We will plan for bronchoscopy at 1215. ?Patient will need to come 2 hours early. ?We appreciate PCC's help with scheduling. ?He will remain off of Eliquis.  He has not been taking it for the past several weeks. ? ? ?Current Outpatient Medications:  ?  ELIQUIS 5 MG TABS tablet, Take 5 mg by mouth 2  (two) times daily., Disp: , Rfl:  ?  furosemide (LASIX) 80 MG tablet, Take 80 mg by mouth., Disp: , Rfl:  ?  levofloxacin (LEVAQUIN) 500 MG tablet, Take 500 mg by mouth daily., Disp: , Rfl:  ?  metolazone (ZAROXOLYN)

## 2022-01-26 NOTE — Patient Instructions (Addendum)
Thank you for visiting Dr. Valeta Harms at [facility]. ?Today we recommend the following: ? ?Orders Placed This Encounter  ?Procedures  ? Procedural/ Surgical Case Request: VIDEO BRONCHOSCOPY WITH ENDOBRONCHIAL ULTRASOUND  ? Ambulatory referral to Pulmonology  ? ?Tentative bronchoscopy date on 01/29/2022 ? ?We will need to reschedule your pet scan if we can get your bronch on the 27th.  ? ?Return in about 9 days (around 02/04/2022) for w/ Eric Form, NP . ? ? ? ?Please do your part to reduce the spread of COVID-19.  ? ?

## 2022-01-26 NOTE — H&P (View-Only) (Signed)
? ?Synopsis: Referred in April 2023 for lung mass by Redmond School, MD ? ?Subjective:  ? ?PATIENT ID: Leonides Grills GENDER: male DOB: 06-May-1951, MRN: [id number] ? ?Chief Complaint  ?Patient presents with  ? Consult  ?  Consult for lung mass. Pt states he does have some coughing, SOB and hemoptysis at time. Pt states this has been occurring for a few weeks.   ? ? ?This is a 71 year old gentleman, Past medical history of hypertension, longstanding history of tobacco use.  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  Presents with increased cough sputum production.  He stopped taking his Eliquis due to hemoptysis.  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  Patient was referred today to discuss bronchoscopy and tissue sampling.  Patient is agreeable to proceed. ? ? ? ? ?Past Medical History:  ?Diagnosis Date  ? Hypertension   ? Insomnia   ?  ? ?Family History  ?Problem Relation Age of Onset  ? Coronary artery disease Father   ? Diabetes Father   ?  ? ?Past Surgical History:  ?Procedure Laterality Date  ? HERNIA REPAIR    ? ? ?Social History  ? ?Socioeconomic History  ? Marital status: Married  ?  Spouse name: Not on file  ? Number of children: Not on file  ? Years of education: Not on file  ? Highest education level: Not on file  ?Occupational History  ? Occupation: Retired  ?Tobacco Use  ? Smoking status: Every Day  ?  Types: Cigarettes  ?  Passive exposure: Current  ? Smokeless tobacco: Not on file  ? Tobacco comments:  ?  Pt smokes 1 ppd. 01/26/22  ?Substance and Sexual Activity  ? Alcohol use: Yes  ?  Alcohol/week: 0.0 standard drinks  ?  Comment: 1 bottle wine a day   ? Drug use: No  ? Sexual activity: Not on file  ?Other Topics Concern  ? Not on file  ?Social History Narrative  ? Not on file  ? ?Social Determinants of Health  ? ?Financial Resource Strain: Not on file   ?Food Insecurity: Not on file  ?Transportation Needs: Not on file  ?Physical Activity: Not on file  ?Stress: Not on file  ?Social Connections: Not on file  ?Intimate Partner Violence: Not on file  ?  ? ?No Known Allergies  ? ?Outpatient Medications Prior to Visit  ?Medication Sig Dispense Refill  ? ELIQUIS 5 MG TABS tablet Take 5 mg by mouth 2 (two) times daily.    ? furosemide (LASIX) 80 MG tablet Take 80 mg by mouth.    ? levofloxacin (LEVAQUIN) 500 MG tablet Take 500 mg by mouth daily.    ? metolazone (ZAROXOLYN) 2.5 MG tablet Take 2.5 mg by mouth daily.    ? olmesartan (BENICAR) 40 MG tablet Take 40 mg by mouth daily.    ? Vitamin D, Ergocalciferol, (DRISDOL) 1.25 MG (50000 UNIT) CAPS capsule Take by mouth.    ? zolpidem (AMBIEN) 5 MG tablet Take 5 mg by mouth at bedtime as needed for sleep.    ? ?No facility-administered medications prior to visit.  ? ? ?Review of Systems  ?Constitutional:  Negative for chills, fever, malaise/fatigue and weight loss.  ?HENT:  Negative for hearing loss, sore throat and tinnitus.   ?Eyes:  Negative for blurred vision and double vision.  ?Respiratory:  Positive for cough, sputum  production and shortness of breath. Negative for hemoptysis, wheezing and stridor.   ?Cardiovascular:  Negative for chest pain, palpitations, orthopnea, leg swelling and PND.  ?Gastrointestinal:  Negative for abdominal pain, constipation, diarrhea, heartburn, nausea and vomiting.  ?Genitourinary:  Negative for dysuria, hematuria and urgency.  ?Musculoskeletal:  Negative for joint pain and myalgias.  ?Skin:  Negative for itching and rash.  ?Neurological:  Negative for dizziness, tingling, weakness and headaches.  ?Endo/Heme/Allergies:  Negative for environmental allergies. Does not bruise/bleed easily.  ?Psychiatric/Behavioral:  Negative for depression. The patient is not nervous/anxious and does not have insomnia.   ?All other systems reviewed and are negative. ? ? ?Objective:  ?Physical Exam ?Vitals  reviewed.  ?Constitutional:   ?   General: He is not in acute distress. ?   Appearance: He is well-developed. He is obese.  ?HENT:  ?   Head: Normocephalic and atraumatic.  ?Eyes:  ?   General: No scleral icterus. ?   Conjunctiva/sclera: Conjunctivae normal.  ?   Pupils: Pupils are equal, round, and reactive to light.  ?Neck:  ?   Vascular: No JVD.  ?   Trachea: No tracheal deviation.  ?Cardiovascular:  ?   Rate and Rhythm: Normal rate and regular rhythm.  ?   Heart sounds: Normal heart sounds. No murmur heard. ?Pulmonary:  ?   Effort: Pulmonary effort is normal. No tachypnea, accessory muscle usage or respiratory distress.  ?   Breath sounds: No stridor. Rhonchi present. No wheezing.  ?Abdominal:  ?   General: There is no distension.  ?   Palpations: Abdomen is soft.  ?   Tenderness: There is no abdominal tenderness.  ?Musculoskeletal:     ?   General: No tenderness.  ?   Cervical back: Neck supple.  ?   Right lower leg: Edema present.  ?   Left lower leg: Edema present.  ?Lymphadenopathy:  ?   Cervical: No cervical adenopathy.  ?Skin: ?   General: Skin is warm and dry.  ?   Capillary Refill: Capillary refill takes less than 2 seconds.  ?   Findings: No rash.  ?Neurological:  ?   Mental Status: He is alert and oriented to person, place, and time.  ?Psychiatric:     ?   Behavior: Behavior normal.  ? ? ? ?Vitals:  ? 01/26/22 1524  ?BP: 136/68  ?Pulse: 70  ?Temp: 98.4 ?F (36.9 ?C)  ?TempSrc: Oral  ?SpO2: 94%  ?Weight: 242 lb (109.8 kg)  ?Height: 5\' 8"  (1.727 m)  ? ?94% on RA ?BMI Readings from Last 3 Encounters:  ?01/26/22 36.80 kg/m?  ?01/23/22 37.33 kg/m?  ?12/24/14 38.47 kg/m?  ? ?Wt Readings from Last 3 Encounters:  ?01/26/22 242 lb (109.8 kg)  ?01/23/22 245 lb 8 oz (111.4 kg)  ?12/24/14 253 lb (114.8 kg)  ? ? ? ?CBC ?No results found for: WBC, RBC, HGB, HCT, PLT, MCV, MCH, MCHC, RDW, LYMPHSABS, MONOABS, EOSABS, BASOSABS ? ? ? ?Chest Imaging: ? 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 ?The patient's images have been independently reviewed by me.   ? ?Pulmonary Functions Testing Results: ?   ? View : No data to display.  ?  ?  ?  ? ? ?FeNO:  ? ?Pathology:  ? ?Echocardiogram:  ? ?Heart Catheterization:  ?   ?Assessment & Plan:  ? ?  ICD-10-CM   ?1. Lung mass  R91.8 Procedural/ Surgical Case Request: VIDEO BRONCHOSCOPY  WITH ENDOBRONCHIAL ULTRASOUND  ?  Ambulatory referral to Pulmonology  ?  Procedural/ Surgical Case Request: VIDEO BRONCHOSCOPY WITH ENDOBRONCHIAL ULTRASOUND  ?  ?2. Adenopathy  R59.9 Procedural/ Surgical Case Request: VIDEO BRONCHOSCOPY WITH ENDOBRONCHIAL ULTRASOUND  ?  Ambulatory referral to Pulmonology  ?  Procedural/ Surgical Case Request: VIDEO BRONCHOSCOPY WITH ENDOBRONCHIAL ULTRASOUND  ?  ?3. Tobacco use  Z72.0   ?  ?4. Hemoptysis  R04.2   ?  ? ? ?Discussion: ? ?This is a 71 year old gentleman, longstanding history of tobacco use, recent hemoptysis, cough sputum production, CT scan concern for right-sided lung mass with associated adenopathy concern for advanced stage bronchogenic carcinoma. ? ?Plan: ?Today in the office we discussed the risk benefits and alternatives proceeding with bronchoscopy and tissue sampling. ?We talked about the risk of bleeding and pneumothorax. ?Patient is agreeable to proceed. ?He does have a PET scan scheduled for Thursday afternoon which we will need to reschedule. ?We will plan to do bronchoscopy at [facility] on 01/29/2022. ?Patient is agreeable to proceed. ?I have called and spoke with the OR as endoscopy is unable to accommodate any additional cases on Thursday due to staffing. ?We will plan for bronchoscopy at 1215. ?Patient will need to come 2 hours early. ?We appreciate PCC's help with scheduling. ?He will remain off of Eliquis.  He has not been taking it for the past several weeks. ? ? ?Current Outpatient Medications:  ?  ELIQUIS 5 MG TABS tablet, Take 5 mg by mouth 2  (two) times daily., Disp: , Rfl:  ?  furosemide (LASIX) 80 MG tablet, Take 80 mg by mouth., Disp: , Rfl:  ?  levofloxacin (LEVAQUIN) 500 MG tablet, Take 500 mg by mouth daily., Disp: , Rfl:  ?  metolazone (ZAROXOLYN)

## 2022-01-28 ENCOUNTER — Encounter (HOSPITAL_COMMUNITY): Payer: Self-pay | Admitting: Pulmonary Disease

## 2022-01-28 ENCOUNTER — Other Ambulatory Visit: Payer: Self-pay

## 2022-01-28 NOTE — Progress Notes (Addendum)
Anesthesia Chart Review: ?Same-day work-up ? ?Patient is a retired Museum/gallery curator.  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  Nonobstructive coronary disease by cath 2018.  Last seen 03/13/2020 in follow-up after ablation.  He is maintained on sotalol and Eliquis.  Patient currently holding Eliquis due to recent hemoptysis. ? ?Patient is a 50+ pack year smoker.  Currently smoking 1 pack/day per pulmonology note 01/26/2022.Marland Kitchen  Patient recently developed hemoptysis.  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  He was referred to oncology by his PCP Dr. Gerarda Fraction.  He was subsequent referred to pulmonology for bronchoscopy and biopsy. ? ?Reviewed history with anesthesiologist Dr. Kalman Shan. He advised okay to proceed barring acute status change. Pt will need labs and EKG DOS. ? ?EKG 03/13/2020 (Care Everywhere): Sinus rhythm with marked sinus arrhythmia.  Rate 83.  Nonspecific ST and T wave abnormality. ? ?TTE 10/26/2019 (Care Everywhere): ?SUMMARY  ?The left ventricle is mildly dilated.  ?The left ventricular size is normal.  ?Left ventricular systolic function is mildly reduced.  ?LV ejection fraction = 45-50%.  ?There is mild global hypokinesis of the left ventricle.  ?The right ventricle is borderline dilated.  ?The right ventricular systolic function is normal.  ?The left atrium is mildly dilated.  ?There is mild mitral regurgitation.  ?There is no significant valvular stenosis or regurgitation.  ?The aortic sinus is normal size.  ?The IVC is _ dialted with an abnormal collapsibility index, this  ?suggestive of increased right atrial pressure.  ?There is trivial pericardial effusion.  ?There is no comparison study available.  ? ?Cath 10/23/2016 (Care Everywhere): ?Diagnostic findings:  ? ?Left coronary artery:  ?Left main  has no significant stenosis and bifurcates distally.  ?LAD is a large vessel that wraps the apex. There are 2 small diagonal vessels. The mid to distal LAD is ectatic with 25-50% stenosis noted  ?The circumflex artery is a large artery. There is a small obtuse marginal one/ramus artery. Obtuse marginal 2 is large and bifurcating with 25% stenosis.  ? ?Right coronary artery:  ?Large dominant artery with 50% stenosis in the midportion of the vessel. 25% stenosis in the distal vessel  ? ?Aortic pressure 110/71  ?Left ventricular pressure 110/10  ?Given our difficulty engaging the right coronary artery and the amount of dye that I had utilized I elected not to perform left ventriculography. Patient had preserved ejection fraction based on his previous echocardiogram. ? ? ? ?Karoline Caldwell, PA-C ?[facility] Stay [facility]/Anesthesiology ?Phone [phone] ?01/28/2022 10:20 AM ? ?

## 2022-01-28 NOTE — Anesthesia Preprocedure Evaluation (Addendum)
Anesthesia Evaluation  ?Patient identified by MRN, date of birth, ID band ?Patient awake ? ? ? ?Reviewed: ?Allergy & Precautions, NPO status , Patient's Chart, lab work & pertinent test results, reviewed documented beta blocker date and time  ? ?Airway ?Mallampati: IV ? ?TM Distance: >3 FB ?Neck ROM: Full ? ? ? Dental ? ?(+) Teeth Intact, Dental Advisory Given, Caps ?  ?Pulmonary ?sleep apnea , Current Smoker and Patient abstained from smoking.,  ?Lung mass, adenopathy ?  ?Pulmonary exam normal ?breath sounds clear to auscultation ? ? ? ? ? ? Cardiovascular ?hypertension, Pt. on home beta blockers ?+CHF  ?Normal cardiovascular exam+ dysrhythmias Atrial Fibrillation  ?Rhythm:Regular Rate:Normal ? ? ?  ?Neuro/Psych ?negative neurological ROS ? negative psych ROS  ? GI/Hepatic ?negative GI ROS, Neg liver ROS,   ?Endo/Other  ?Obesity ? ? Renal/GU ?negative Renal ROS  ? ?  ?Musculoskeletal ?negative musculoskeletal ROS ?(+)  ? Abdominal ?  ?Peds ? Hematology ? ?(+) Blood dyscrasia (Eliquis), ,   ?Anesthesia Other Findings ?Day of surgery medications reviewed with the patient. ? Reproductive/Obstetrics ? ?  ? ? ? ? ? ? ? ? ? ? ? ? ? ?  ?  ? ? ? ? ? ? ? ?Anesthesia Physical ?Anesthesia Plan ? ?ASA: 3 ? ?Anesthesia Plan: General  ? ?Post-op Pain Management: Tylenol PO (pre-op)*  ? ?Induction: Intravenous ? ?PONV Risk Score and Plan: 1 and Midazolam, Dexamethasone and Ondansetron ? ?Airway Management Planned: Oral ETT and Video Laryngoscope Planned ? ?Additional Equipment:  ? ?Intra-op Plan:  ? ?Post-operative Plan: Extubation in OR ? ?Informed Consent: I have reviewed the patients History and Physical, chart, labs and discussed the procedure including the risks, benefits and alternatives for the proposed anesthesia with the patient or authorized representative who has indicated his/her understanding and acceptance.  ? ? ? ?Dental advisory given ? ?Plan Discussed with: CRNA ? ?Anesthesia  Plan Comments: (PAT note by Karoline Caldwell, PA-C: ?Patient is a retired Museum/gallery curator.  He follows with cardiology at [facility] for history of pAF and AFL s/p PVI/CTI RFA (01/29/2020), chronic combined heart failure (EF 45-50% by echo 10/2019).  Nonobstructive coronary disease by cath 2018.  Last seen 03/13/2020 in follow-up after ablation.  He is maintained on sotalol and Eliquis.  Patient currently holding Eliquis due to recent hemoptysis. ? ?Patient is a 50+ pack year smoker.  Currently smoking 1 pack/day per pulmonology note 01/26/2022.Marland Kitchen  Patient recently developed hemoptysis.  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  He was referred to oncology by his PCP Dr. Gerarda Fraction.  He was subsequent referred to pulmonology for bronchoscopy and biopsy. ? ?Reviewed history with anesthesiologist Dr. Kalman Shan. He advised okay to proceed barring acute status change. Pt will need labs and EKG DOS. ? ?EKG 03/13/2020 (Care Everywhere): Sinus rhythm with marked sinus arrhythmia.  Rate 83.  Nonspecific ST and T wave abnormality. ? ?TTE 10/26/2019 (Care Everywhere): ?SUMMARY  ?The left ventricle is mildly dilated.  ?The left ventricular size is normal.  ?Left ventricular systolic function is mildly reduced.  ?LV ejection fraction = 45-50%.  ?There is mild global hypokinesis of the left ventricle.  ?The right ventricle is borderline dilated.  ?The right ventricular systolic function is normal.  ?The left atrium is mildly dilated.  ?There is mild mitral regurgitation.  ?There is no significant valvular stenosis or regurgitation.  ?The aortic sinus is normal size.  ?The IVC is _  dialted with an abnormal collapsibility index, this  ?suggestive of increased right atrial pressure.  ?There is trivial pericardial effusion.  ?There is no comparison study available.  ? ?Cath 10/23/2016 (Care Everywhere): ?Diagnostic findings:  ? ?Left coronary artery:  ?Left main  has no significant stenosis and bifurcates distally.  ?LAD is a large vessel that wraps the apex. There are 2 small diagonal vessels. The mid to distal LAD is ectatic with 25-50% stenosis noted  ?The circumflex artery is a large artery. There is a small obtuse marginal one/ramus artery. Obtuse marginal 2 is large and bifurcating with 25% stenosis.  ? ?Right coronary artery:  ?Large dominant artery with 50% stenosis in the midportion of the vessel. 25% stenosis in the distal vessel  ? ?Aortic pressure 110/71  ?Left ventricular pressure 110/10  ?Given our difficulty engaging the right coronary artery and the amount of dye that I had utilized I elected not to perform left ventriculography. Patient had preserved ejection fraction based on his previous echocardiogram. ? ? ?)  ? ? ? ? ? ?Anesthesia Quick Evaluation ? ?

## 2022-01-28 NOTE — Progress Notes (Signed)
SDW CALL ? ?Patient was given pre-op instructions over the phone. The opportunity was given for the patient to ask questions. Patient verbalized understanding of instructions given. ? ? ?PCP - Dr. Carolin Sicks ?Cardiologist - Dr. Hamilton Capri @ [place] ? ?Chest x-ray - 10-25-19 ?EKG - DOS ?ECHO - 10-26-19 ?Cardiac Cath - 10-23-2016 ? ?Sleep Study - 6-7 years ago ?CPAP - nightly ? ?Blood Thinner Instructions: Eliquis currently on HOLD ?Aspirin Instructions: ? ?ERAS Protcol - clears until 9:15 ? ?COVID TEST- DOS ? ?Anesthesia review: YES ? ?Patient denies fever, cough and chest pain over the phone call. Endorses SOB with exertion.  ? ? ?All instructions explained to the patient, with a verbal understanding of the material.  ?

## 2022-01-29 ENCOUNTER — Encounter (HOSPITAL_COMMUNITY)
Admission: RE | Disposition: A | Discharge status: Home / Self Care | Payer: Self-pay | Source: Home / Self Care | Attending: Pulmonary Disease

## 2022-01-29 ENCOUNTER — Ambulatory Visit (HOSPITAL_COMMUNITY)
Admission: RE | Admit: 2022-01-29 | Discharge: 2022-01-29 | Disposition: A | Discharge status: Home / Self Care | Payer: PPO | Attending: Pulmonary Disease | Admitting: Pulmonary Disease

## 2022-01-29 ENCOUNTER — Encounter (HOSPITAL_COMMUNITY): Payer: Self-pay | Admitting: Pulmonary Disease

## 2022-01-29 ENCOUNTER — Ambulatory Visit (HOSPITAL_COMMUNITY): Admission: RE | Admit: 2022-01-29 | Payer: PPO | Source: Ambulatory Visit

## 2022-01-29 ENCOUNTER — Ambulatory Visit (HOSPITAL_BASED_OUTPATIENT_CLINIC_OR_DEPARTMENT_OTHER): Payer: PPO | Admitting: Physician Assistant

## 2022-01-29 ENCOUNTER — Ambulatory Visit (HOSPITAL_COMMUNITY): Payer: PPO | Admitting: Physician Assistant

## 2022-01-29 DIAGNOSIS — R918 Other nonspecific abnormal finding of lung field: Secondary | ICD-10-CM

## 2022-01-29 DIAGNOSIS — F17210 Nicotine dependence, cigarettes, uncomplicated: Secondary | ICD-10-CM | POA: Diagnosis not present

## 2022-01-29 DIAGNOSIS — I509 Heart failure, unspecified: Secondary | ICD-10-CM | POA: Diagnosis not present

## 2022-01-29 DIAGNOSIS — C771 Secondary and unspecified malignant neoplasm of intrathoracic lymph nodes: Secondary | ICD-10-CM | POA: Diagnosis not present

## 2022-01-29 DIAGNOSIS — C3491 Malignant neoplasm of unspecified part of right bronchus or lung: Secondary | ICD-10-CM | POA: Diagnosis not present

## 2022-01-29 DIAGNOSIS — Z20822 Contact with and (suspected) exposure to covid-19: Secondary | ICD-10-CM | POA: Insufficient documentation

## 2022-01-29 DIAGNOSIS — R599 Enlarged lymph nodes, unspecified: Secondary | ICD-10-CM

## 2022-01-29 DIAGNOSIS — C3401 Malignant neoplasm of right main bronchus: Secondary | ICD-10-CM | POA: Insufficient documentation

## 2022-01-29 DIAGNOSIS — E669 Obesity, unspecified: Secondary | ICD-10-CM | POA: Insufficient documentation

## 2022-01-29 DIAGNOSIS — Z79899 Other long term (current) drug therapy: Secondary | ICD-10-CM | POA: Insufficient documentation

## 2022-01-29 DIAGNOSIS — I2510 Atherosclerotic heart disease of native coronary artery without angina pectoris: Secondary | ICD-10-CM | POA: Insufficient documentation

## 2022-01-29 DIAGNOSIS — R590 Localized enlarged lymph nodes: Secondary | ICD-10-CM | POA: Diagnosis not present

## 2022-01-29 DIAGNOSIS — I10 Essential (primary) hypertension: Secondary | ICD-10-CM

## 2022-01-29 DIAGNOSIS — D759 Disease of blood and blood-forming organs, unspecified: Secondary | ICD-10-CM | POA: Diagnosis not present

## 2022-01-29 DIAGNOSIS — I110 Hypertensive heart disease with heart failure: Secondary | ICD-10-CM | POA: Insufficient documentation

## 2022-01-29 DIAGNOSIS — I5042 Chronic combined systolic (congestive) and diastolic (congestive) heart failure: Secondary | ICD-10-CM | POA: Diagnosis not present

## 2022-01-29 DIAGNOSIS — Z6836 Body mass index (BMI) 36.0-36.9, adult: Secondary | ICD-10-CM | POA: Diagnosis not present

## 2022-01-29 DIAGNOSIS — C801 Malignant (primary) neoplasm, unspecified: Secondary | ICD-10-CM | POA: Diagnosis not present

## 2022-01-29 HISTORY — DX: Cardiac arrhythmia, unspecified: I49.9

## 2022-01-29 HISTORY — DX: Sleep apnea, unspecified: G47.30

## 2022-01-29 HISTORY — DX: Prediabetes: R73.03

## 2022-01-29 HISTORY — DX: Dyspnea, unspecified: R06.00

## 2022-01-29 HISTORY — PX: VIDEO BRONCHOSCOPY WITH ENDOBRONCHIAL ULTRASOUND: SHX6177

## 2022-01-29 LAB — BASIC METABOLIC PANEL
Anion gap: 9 (ref 5–15)
BUN: 10 mg/dL (ref 8–23)
CO2: 29 mmol/L (ref 22–32)
Calcium: 8.3 mg/dL — ABNORMAL LOW (ref 8.9–10.3)
Chloride: 99 mmol/L (ref 98–111)
Creatinine, Ser: 0.8 mg/dL (ref 0.61–1.24)
GFR, Estimated: >60 mL/min (ref >60)
Glucose, Bld: 102 mg/dL — ABNORMAL HIGH (ref 70–99)
Potassium: 3 mmol/L — ABNORMAL LOW (ref 3.5–5.1)
Sodium: 137 mmol/L (ref 135–145)

## 2022-01-29 LAB — CBC
HCT: 46.2 % (ref 39.0–52.0)
Hemoglobin: 16 g/dL (ref 13.0–17.0)
MCH: 31.4 pg (ref 26.0–34.0)
MCHC: 34.6 g/dL (ref 30.0–36.0)
MCV: 90.6 fL (ref 80.0–100.0)
Platelets: 276 10*3/uL (ref 150–400)
RBC: 5.1 MIL/uL (ref 4.22–5.81)
RDW: 13.5 % (ref 11.5–15.5)
WBC: 8.7 10*3/uL (ref 4.0–10.5)
nRBC: 0 % (ref 0.0–0.2)

## 2022-01-29 LAB — SARS CORONAVIRUS 2 BY RT PCR: SARS Coronavirus 2 by RT PCR: NEGATIVE

## 2022-01-29 SURGERY — BRONCHOSCOPY, WITH EBUS
Anesthesia: General | Site: Chest | Laterality: Bilateral

## 2022-01-29 MED ORDER — ONDANSETRON HCL 4 MG/2ML IJ SOLN
INTRAMUSCULAR | Status: AC
  Filled 2022-01-29: qty 2

## 2022-01-29 MED ORDER — ACETAMINOPHEN 500 MG PO TABS: 1000.0000 mg | ORAL_TABLET | Freq: Once | ORAL | Status: AC

## 2022-01-29 MED ORDER — LACTATED RINGERS IV SOLN: INTRAVENOUS | Status: DC

## 2022-01-29 MED ORDER — LIDOCAINE 2% (20 MG/ML) 5 ML SYRINGE
INTRAMUSCULAR | Status: DC | PRN
  Administered 2022-01-29: 80 mg via INTRAVENOUS

## 2022-01-29 MED ORDER — PROPOFOL 10 MG/ML IV BOLUS
INTRAVENOUS | Status: AC
  Filled 2022-01-29: qty 20

## 2022-01-29 MED ORDER — EPINEPHRINE PF 1 MG/ML IJ SOLN
INTRAMUSCULAR | Status: AC
  Filled 2022-01-29: qty 1

## 2022-01-29 MED ORDER — 0.9 % SODIUM CHLORIDE (POUR BTL) OPTIME
TOPICAL | Status: DC | PRN
Start: 2022-01-29 — End: 2022-01-29
  Administered 2022-01-29: 1000 mL

## 2022-01-29 MED ORDER — PHENYLEPHRINE 80 MCG/ML (10ML) SYRINGE FOR IV PUSH (FOR BLOOD PRESSURE SUPPORT)
PREFILLED_SYRINGE | INTRAVENOUS | Status: DC | PRN
  Administered 2022-01-29 (×2): 160 ug via INTRAVENOUS

## 2022-01-29 MED ORDER — ONDANSETRON HCL 4 MG/2ML IJ SOLN
INTRAMUSCULAR | Status: DC | PRN
  Administered 2022-01-29: 4 mg via INTRAVENOUS

## 2022-01-29 MED ORDER — ALBUTEROL SULFATE HFA 108 (90 BASE) MCG/ACT IN AERS
INHALATION_SPRAY | RESPIRATORY_TRACT | Status: DC | PRN
  Administered 2022-01-29: 2 via RESPIRATORY_TRACT

## 2022-01-29 MED ORDER — FENTANYL CITRATE (PF) 250 MCG/5ML IJ SOLN
INTRAMUSCULAR | Status: AC
  Filled 2022-01-29: qty 5

## 2022-01-29 MED ORDER — ALBUMIN HUMAN 5 % IV SOLN
INTRAVENOUS | Status: DC | PRN
Start: 2022-01-29 — End: 2022-01-29

## 2022-01-29 MED ORDER — MIDAZOLAM HCL 2 MG/2ML IJ SOLN
INTRAMUSCULAR | Status: DC | PRN
Start: 2022-01-29 — End: 2022-01-29
  Administered 2022-01-29: 1 mg via INTRAVENOUS

## 2022-01-29 MED ORDER — PROPOFOL 10 MG/ML IV BOLUS
INTRAVENOUS | Status: DC | PRN
Start: 2022-01-29 — End: 2022-01-29
  Administered 2022-01-29: 140 mg via INTRAVENOUS

## 2022-01-29 MED ORDER — PHENYLEPHRINE HCL-NACL 20-0.9 MG/250ML-% IV SOLN
INTRAVENOUS | Status: DC | PRN
  Administered 2022-01-29: 40 ug/min via INTRAVENOUS

## 2022-01-29 MED ORDER — MIDAZOLAM HCL 2 MG/2ML IJ SOLN
INTRAMUSCULAR | Status: AC
  Filled 2022-01-29: qty 2

## 2022-01-29 MED ORDER — SUGAMMADEX SODIUM 200 MG/2ML IV SOLN
INTRAVENOUS | Status: DC | PRN
  Administered 2022-01-29 (×2): 100 mg via INTRAVENOUS

## 2022-01-29 MED ORDER — FENTANYL CITRATE (PF) 250 MCG/5ML IJ SOLN
INTRAMUSCULAR | Status: DC | PRN
  Administered 2022-01-29 (×2): 25 ug via INTRAVENOUS

## 2022-01-29 MED ORDER — ROCURONIUM BROMIDE 10 MG/ML (PF) SYRINGE
PREFILLED_SYRINGE | INTRAVENOUS | Status: DC | PRN
  Administered 2022-01-29: 50 mg via INTRAVENOUS

## 2022-01-29 MED ORDER — ACETAMINOPHEN 500 MG PO TABS
ORAL_TABLET | ORAL | Status: AC
  Administered 2022-01-29: 1000 mg via ORAL
  Filled 2022-01-29: qty 2

## 2022-01-29 MED ORDER — ORAL CARE MOUTH RINSE: 15.0000 mL | Freq: Once | OROMUCOSAL | Status: AC

## 2022-01-29 MED ORDER — CHLORHEXIDINE GLUCONATE 0.12 % MT SOLN
15.0000 mL | Freq: Once | OROMUCOSAL | Status: AC
  Administered 2022-01-29: 15 mL via OROMUCOSAL
  Filled 2022-01-29: qty 15

## 2022-01-29 MED ORDER — DEXAMETHASONE SODIUM PHOSPHATE 10 MG/ML IJ SOLN
INTRAMUSCULAR | Status: DC | PRN
  Administered 2022-01-29: 10 mg via INTRAVENOUS

## 2022-01-29 SURGICAL SUPPLY — 38 items
ADAPTER VALVE BIOPSY EBUS (MISCELLANEOUS) IMPLANT
ADPTR VALVE BIOPSY EBUS (MISCELLANEOUS)
BALLN FOR EBUS SCOPE (BALLOONS) ×4
BALLOON FOR EBUS SCOPE (BALLOONS) IMPLANT
BRUSH CYTOL CELLEBRITY 1.5X140 (MISCELLANEOUS) IMPLANT
CANISTER SUCT 3000ML PPV (MISCELLANEOUS) ×2 IMPLANT
CNTNR URN SCR LID CUP LEK RST (MISCELLANEOUS) ×1 IMPLANT
CONT SPEC 4OZ STRL OR WHT (MISCELLANEOUS) ×2
COVER BACK TABLE 60X90IN (DRAPES) ×2 IMPLANT
FILTER STRAW FLUID ASPIR (MISCELLANEOUS) IMPLANT
FORCEPS BIOP RJ4 1.8 (CUTTING FORCEPS) ×1 IMPLANT
GAUZE SPONGE 4X4 12PLY STRL (GAUZE/BANDAGES/DRESSINGS) ×3 IMPLANT
GLOVE SURG SS PI 7.5 STRL IVOR (GLOVE) ×4 IMPLANT
GOWN STRL REUS W/ TWL LRG LVL3 (GOWN DISPOSABLE) ×2 IMPLANT
GOWN STRL REUS W/TWL LRG LVL3 (GOWN DISPOSABLE) ×4
KIT CLEAN ENDO COMPLIANCE (KITS) ×4 IMPLANT
KIT TURNOVER KIT B (KITS) ×2 IMPLANT
MARKER SKIN DUAL TIP RULER LAB (MISCELLANEOUS) ×2 IMPLANT
NDL ASPIRATION VIZISHOT 19G (NEEDLE) IMPLANT
NDL ASPIRATION VIZISHOT 21G (NEEDLE) IMPLANT
NEEDLE ASPIRATION VIZISHOT 19G (NEEDLE) ×2 IMPLANT
NEEDLE ASPIRATION VIZISHOT 21G (NEEDLE) IMPLANT
NS IRRIG 1000ML POUR BTL (IV SOLUTION) ×2 IMPLANT
OIL SILICONE PENTAX (PARTS (SERVICE/REPAIRS)) ×2 IMPLANT
PAD ARMBOARD 7.5X6 YLW CONV (MISCELLANEOUS) ×4 IMPLANT
STOPCOCK 4 WAY LG BORE MALE ST (IV SETS) ×2 IMPLANT
SYR 20ML ECCENTRIC (SYRINGE) ×6 IMPLANT
SYR 20ML LL LF (SYRINGE) ×2 IMPLANT
SYR 3ML LL SCALE MARK (SYRINGE) IMPLANT
SYR 50ML SLIP (SYRINGE) ×2 IMPLANT
SYR 5ML LL (SYRINGE) ×8 IMPLANT
TRAP SPECIMEN MUCUS 40CC (MISCELLANEOUS) IMPLANT
TUBE CONNECTING 20X1/4 (TUBING) ×2 IMPLANT
TUBING EXTENTION W/L.L. (IV SETS) ×2 IMPLANT
VALVE BIOPSY  SINGLE USE (MISCELLANEOUS) ×2
VALVE BIOPSY SINGLE USE (MISCELLANEOUS) ×1 IMPLANT
VALVE SUCTION BRONCHIO DISP (MISCELLANEOUS) ×2 IMPLANT
WATER STERILE IRR 1000ML POUR (IV SOLUTION) ×2 IMPLANT

## 2022-01-29 NOTE — Anesthesia Postprocedure Evaluation (Signed)
Anesthesia Post Note ? ?Patient: Larry Moore ? ?Procedure(s) Performed: VIDEO BRONCHOSCOPY WITH ENDOBRONCHIAL ULTRASOUND (Bilateral: Chest) ? ?  ? ?Patient location during evaluation: PACU ?Anesthesia Type: General ?Level of consciousness: awake and alert ?Pain management: pain level controlled ?Vital Signs Assessment: post-procedure vital signs reviewed and stable ?Respiratory status: spontaneous breathing, nonlabored ventilation, respiratory function stable and patient connected to nasal cannula oxygen ?Cardiovascular status: blood pressure returned to baseline and stable ?Postop Assessment: no apparent nausea or vomiting ?Anesthetic complications: no ? ? ?No notable events documented. ? ?Last Vitals:  ?Vitals:  ? 01/29/22 1245 01/29/22 1255  ?BP:  (!) 97/59  ?Pulse: (!) 59 (!) 58  ?Resp: 20 14  ?Temp:    ?SpO2: 100% 100%  ?  ?Last Pain:  ?Vitals:  ? 01/29/22 1227  ?TempSrc:   ?PainSc: 0-No pain  ? ? ?  ?  ?  ?  ?  ?  ? ?Santa Lighter ? ? ? ? ?

## 2022-01-29 NOTE — Op Note (Signed)
Video Bronchoscopy with Endobronchial Ultrasound Procedure Note ? ?Date of Operation: 01/29/2022 ? ?Pre-op Diagnosis: Lung mass, adenopathy  ? ?Post-op Diagnosis: Lung mass, adenopathy  ? ?Surgeon: Octavio Graves Adda Stokes,DO ? ?Assistants: None  ? ?Anesthesia: General endotracheal anesthesia ? ?Operation: Flexible video fiberoptic bronchoscopy with endobronchial ultrasound and biopsies. ? ?Estimated Blood Loss: Minimal ? ?Complications: None  ? ?Indications and History: ?Larry Moore is a 71 y.o. male with right lung mass, adenopathy.  The risks, benefits, complications, treatment options and expected outcomes were discussed with the patient.  The possibilities of pneumothorax, pneumonia, reaction to medication, pulmonary aspiration, perforation of a viscus, bleeding, failure to diagnose a condition and creating a complication requiring transfusion or operation were discussed with the patient who freely signed the consent.   ? ?Description of Procedure: ?The patient was examined in the preoperative area and history and data from the preprocedure consultation were reviewed. It was deemed appropriate to proceed.  The patient was taken to OR 10, identified as Larry Moore and the procedure verified as Flexible Video Fiberoptic Bronchoscopy.  A Time Out was held and the above information confirmed. After being taken to the operating room general anesthesia was initiated and the patient  was orally intubated. 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 The standard scope was then withdrawn and the endobronchial ultrasound was used to identify and characterize the peritracheal, hilar and bronchial lymph nodes.  Inspection showed enlarged subcarinal station 7 adenopathy. Using real-time ultrasound guidance Wang needle biopsies were take from Station 7 nodes and were sent for cytology.  Following the ultrasound portion of the procedure the Olympus endobronchial ultrasound scope was exchanged for therapeutic Olympus bronchoscope.  We then used a 2.8 mm [employer] forceps for biopsies of the visible endobronchial tumor to ensure there be adequate tissue for cell analysis and molecular genetics.  This was sent in a separate cytology specimen container.  Using ice saline we washed the area to help with hemostasis and clearance of any remaining blood clots and debris.  The therapeutic bronchoscope was used for aspiration of the bilateral mainstem's and there was no evidence of active bleeding and the airways were patent except for the tumor infiltration within the right lung.  The patient tolerated the procedure well without apparent complications. There was no significant blood loss. The bronchoscope was withdrawn. Anesthesia was reversed and the patient was taken to the PACU for recovery.  ? ?Samples: ?1. Wang needle biopsies from station 7 node ?2.  Right mainstem endobronchial forcep biopsies ? ?Plans:  ?The patient will be discharged from the PACU to home when recovered from anesthesia. We will review the cytology, pathology results with the patient when they become available. Outpatient followup will be with Dr. Tera Helper.  ? ? ?Larry Nash, DO ?[facility] ?01/29/2022 12:13 PM   ?

## 2022-01-29 NOTE — Interval H&P Note (Signed)
History and Physical Interval Note: ? ?01/29/2022 ?10:47 AM ? ?Larry Moore  has presented today for surgery, with the diagnosis of Lung mass, adenopathy.  The various methods of treatment have been discussed with the patient and family. After consideration of risks, benefits and other options for treatment, the patient has consented to  Procedure(s) with comments: ?VIDEO BRONCHOSCOPY WITH ENDOBRONCHIAL ULTRASOUND (Bilateral) - w/ Guardant 360cdx as a surgical intervention.  The patient's history has been reviewed, patient examined, no change in status, stable for surgery.  I have reviewed the patient's chart and labs.  Questions were answered to the patient's satisfaction.   ? ? ?[name] ? ? ?

## 2022-01-29 NOTE — Discharge Instructions (Addendum)
CONTINUE TO HOLD ELIQUIS DUE TO INCREASED RISK OF BLEEDING AND HEMOPTYSIS. I WOULD HOLD ELIQUIS UNTIL YOU HAVE COMPLETED A FEW SESSIONS OF RADIATION TREATMENTS.  ? ?Flexible Bronchoscopy, Care After ?This sheet gives you information about how to care for yourself after your test. Your doctor may also give you more specific instructions. If you have problems or questions, contact your doctor. ?Follow these instructions at home: ?Eating and drinking ?Do not eat or drink anything (not even water) for 2 hours after your test, or until your numbing medicine (local anesthetic) wears off. ?When your numbness is gone and your cough and gag reflexes have come back, you may: ?Eat only soft foods. ?Slowly drink liquids. ?The day after the test, go back to your normal diet. ?Driving ?Do not drive for 24 hours if you were given a medicine to help you relax (sedative). ?Do not drive or use heavy machinery while taking prescription pain medicine. ?General instructions ? ?Take over-the-counter and prescription medicines only as told by your doctor. ?Return to your normal activities as told. Ask what activities are safe for you. ?Do not use any products that have nicotine or tobacco in them. This includes cigarettes and e-cigarettes. If you need help quitting, ask your doctor. ?Keep all follow-up visits as told by your doctor. This is important. It is very important if you had a tissue sample (biopsy) taken. ?Get help right away if: ?You have shortness of breath that gets worse. ?You get light-headed. ?You feel like you are going to pass out (faint). ?You have chest pain. ?You cough up: ?More than a little blood. ?More blood than before. ?Summary ?Do not eat or drink anything (not even water) for 2 hours after your test, or until your numbing medicine wears off. ?Do not use cigarettes. Do not use e-cigarettes. ?Get help right away if you have chest pain. ? ?This information is not intended to replace advice given to you by your  health care provider. Make sure you discuss any questions you have with your health care provider. ?Document Released: 07/19/2009 Document Revised: 09/03/2017 Document Reviewed: 10/09/2016 ?Elsevier Patient Education ? [place]. ? ?

## 2022-01-29 NOTE — Transfer of Care (Signed)
Immediate Anesthesia Transfer of Care Note ? ?Patient: Larry Moore ? ?Procedure(s) Performed: VIDEO BRONCHOSCOPY WITH ENDOBRONCHIAL ULTRASOUND (Bilateral: Chest) ? ?Patient Location: PACU ? ?Anesthesia Type:General ? ?Level of Consciousness: drowsy ? ?Airway & Oxygen Therapy: Patient Spontanous Breathing and Patient connected to nasal cannula oxygen ? ?Post-op Assessment: Report given to RN and Post -op Vital signs reviewed and stable ? ?Post vital signs: Reviewed and stable ? ?Last Vitals:  ?Vitals Value Taken Time  ?BP 108/66 01/29/22 1227  ?Temp    ?Pulse 61 01/29/22 1229  ?Resp 14 01/29/22 1229  ?SpO2 92 % 01/29/22 1229  ?Vitals shown include unvalidated device data. ? ?Last Pain:  ?Vitals:  ? 01/29/22 0953  ?TempSrc:   ?PainSc: 0-No pain  ?   ? ?  ? ?Complications: No notable events documented. ?

## 2022-01-29 NOTE — Anesthesia Procedure Notes (Signed)
Procedure Name: Intubation ?Date/Time: 01/29/2022 12:22 PM ?Performed by: Imagene Riches, CRNA ?Pre-anesthesia Checklist: Patient identified, Emergency Drugs available, Suction available and Patient being monitored ?Patient Re-evaluated:Patient Re-evaluated prior to induction ?Oxygen Delivery Method: Circle System Utilized ?Preoxygenation: Pre-oxygenation with 100% oxygen ?Induction Type: IV induction ?Ventilation: Mask ventilation without difficulty ?Laryngoscope Size: Glidescope and 4 ?Grade View: Grade I ?Tube type: Oral ?Tube size: 7.5 mm ?Number of attempts: 1 ?Airway Equipment and Method: Stylet and Oral airway ?Placement Confirmation: ETT inserted through vocal cords under direct vision, positive ETCO2 and breath sounds checked- equal and bilateral ?Secured at: 23 cm ?Tube secured with: Tape ?Dental Injury: Teeth and Oropharynx as per pre-operative assessment  ?Difficulty Due To: Difficulty was anticipated, Difficult Airway- due to reduced neck mobility and Difficult Airway- due to large tongue ? ? ? ? ?

## 2022-01-30 ENCOUNTER — Encounter (HOSPITAL_COMMUNITY): Payer: Self-pay | Admitting: Pulmonary Disease

## 2022-01-30 ENCOUNTER — Telehealth: Payer: Self-pay | Admitting: Radiation Oncology

## 2022-01-30 LAB — CYTOLOGY - NON PAP

## 2022-01-30 NOTE — Progress Notes (Signed)
Thoracic Location of Tumor / Histology: Lung mass right-side ? ?Biopsies  ?Dr. Gerarda Fraction ?CT Chest (01/21/2022) ?IMPRESSION: ?1. Large (6.5 x 5.2 cm) right hilar mass surrounding and compressing the right upper lobe bronchus, bronchus intermedius, right middle lobe bronchus and right lower lobe bronchus centrally. There is also ?compression of the right lower lobe pulmonary artery. ?2. Nodular soft tissue lesion in the right lower lobe is continuous with the right hilar mass and could represent endobronchial spread of tumor or possibly the patient's primary lung cancer. ?3. Enlarged mediastinal lymph nodes but no findings for pulmonary metastatic disease. ?4. Indeterminate bilateral adrenal gland nodules. ?5. Low-attenuation liver lesions are likely benign cysts. ?6. Recommend referral to multi disciplinary Thoracic Oncology Clinic and PET-CT imaging for further evaluation. ?7. Advanced three-vessel coronary artery calcifications. ? Aortic Atherosclerosis (ICD10-I70.0) and Emphysema (ICD10-J43.9). ? ?MR Brain (01/24/2022) ?Dr. Delton Coombes ?IMPRESSION: ?1. No metastatic disease or acute intracranial abnormality. ?2. 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 ?3. Incidental bilateral paranasal sinus inflammation. ? ?Tobacco/Marijuana/Snuff/ETOH use: Current smoker, no drug, alcohol, or smokeless tobacco. ? ?Past/Anticipated interventions by cardiothoracic surgery, if any: 01/29/2022:  Video Bronchoscopy with endobronchial ultrasound. ? ?Past/Anticipated interventions by medical oncology, if any: NA ? ?Signs/Symptoms ?Weight changes, if any: No ?Respiratory complaints, if any: SOB on exertion ?Hemoptysis, if any: Productive cough with scant amount of blood ?Pain issues, if any:  0/10 ? ?SAFETY ISSUES: ?Prior radiation? No ?Pacemaker/ICD?  No ?Possible current pregnancy? Male ?Is the patient on methotrexate? No ? ?Current Complaints / other details:    ?

## 2022-01-30 NOTE — Telephone Encounter (Signed)
LVM to schedule consult with Dr. Tammi Klippel.  ?

## 2022-02-02 LAB — CYTOLOGY - NON PAP

## 2022-02-03 ENCOUNTER — Other Ambulatory Visit: Payer: Self-pay

## 2022-02-03 ENCOUNTER — Ambulatory Visit
Admission: RE | Admit: 2022-02-03 | Discharge: 2022-02-03 | Disposition: A | Discharge status: Home / Self Care | Payer: PPO | Source: Ambulatory Visit | Attending: Radiation Oncology | Admitting: Radiation Oncology

## 2022-02-03 ENCOUNTER — Telehealth (HOSPITAL_COMMUNITY): Payer: PPO | Admitting: Hematology

## 2022-02-03 VITALS — BP 102/66 | HR 65 | Temp 97.6°F | Resp 24 | Ht 68.0 in | Wt 245.0 lb

## 2022-02-03 DIAGNOSIS — C3411 Malignant neoplasm of upper lobe, right bronchus or lung: Secondary | ICD-10-CM | POA: Insufficient documentation

## 2022-02-03 DIAGNOSIS — I6381 Other cerebral infarction due to occlusion or stenosis of small artery: Secondary | ICD-10-CM | POA: Diagnosis not present

## 2022-02-03 DIAGNOSIS — F17210 Nicotine dependence, cigarettes, uncomplicated: Secondary | ICD-10-CM | POA: Insufficient documentation

## 2022-02-03 DIAGNOSIS — Z510 Encounter for antineoplastic radiation therapy: Secondary | ICD-10-CM | POA: Insufficient documentation

## 2022-02-03 DIAGNOSIS — I10 Essential (primary) hypertension: Secondary | ICD-10-CM | POA: Insufficient documentation

## 2022-02-03 DIAGNOSIS — K769 Liver disease, unspecified: Secondary | ICD-10-CM | POA: Diagnosis not present

## 2022-02-03 DIAGNOSIS — R599 Enlarged lymph nodes, unspecified: Secondary | ICD-10-CM | POA: Insufficient documentation

## 2022-02-03 DIAGNOSIS — G4730 Sleep apnea, unspecified: Secondary | ICD-10-CM | POA: Insufficient documentation

## 2022-02-03 DIAGNOSIS — G4700 Insomnia, unspecified: Secondary | ICD-10-CM | POA: Insufficient documentation

## 2022-02-03 DIAGNOSIS — I700 Atherosclerosis of aorta: Secondary | ICD-10-CM | POA: Diagnosis not present

## 2022-02-03 DIAGNOSIS — I4891 Unspecified atrial fibrillation: Secondary | ICD-10-CM | POA: Diagnosis not present

## 2022-02-03 DIAGNOSIS — C3491 Malignant neoplasm of unspecified part of right bronchus or lung: Secondary | ICD-10-CM

## 2022-02-03 DIAGNOSIS — Z79899 Other long term (current) drug therapy: Secondary | ICD-10-CM | POA: Insufficient documentation

## 2022-02-03 DIAGNOSIS — C771 Secondary and unspecified malignant neoplasm of intrathoracic lymph nodes: Secondary | ICD-10-CM | POA: Insufficient documentation

## 2022-02-03 DIAGNOSIS — J439 Emphysema, unspecified: Secondary | ICD-10-CM | POA: Diagnosis not present

## 2022-02-03 NOTE — Progress Notes (Signed)
?  Radiation Oncology         [phone]) [phone] ?________________________________ ? ?Name: Larry Moore MRN: [id number]  ?Date: 02/03/2022  DOB: [birth year] ? ?SIMULATION AND TREATMENT PLANNING NOTE ? ?  ICD-10-CM   ?1. 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  ? ? 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 ?NARRATIVE:  The patient was brought to the [facility].  Identity was confirmed.  All relevant records and images related to the planned course of therapy were reviewed.  The patient freely provided informed written consent to proceed with treatment after reviewing the details related to the planned course of therapy. The consent form was witnessed and verified by the simulation staff.  Then, the patient was set-up in a stable reproducible  supine position for radiation therapy.  CT images were obtained.  Surface markings were placed.  The CT images were loaded into the planning software.  Then the target and avoidance structures were contoured.  Treatment planning then occurred.  The radiation prescription was entered and confirmed.  Then, I designed and supervised the construction of a total of 6 medically necessary complex treatment devices, including a BodyFix immobilization mold custom fitted to the patient along with 5 multileaf collimators conformally shaped radiation around the treatment target while shielding critical structures such as the heart and spinal cord maximally.  I have requested : 3D Simulation  I have requested a DVH of the following structures: Left lung, right lung, spinal cord, heart, esophagus, and target.  I have ordered:Nutrition Consult ? ?SPECIAL TREATMENT PROCEDURE:  The planned course of therapy using radiation constitutes a special treatment procedure. Special care is required in the management of this patient for the following reasons.  The patient will be receiving  concurrent chemotherapy requiring careful monitoring for increased toxicities of treatment including periodic laboratory values.  The special nature of the planned course of radiotherapy will require increased physician supervision and oversight to ensure patient's safety with optimal treatment outcomes. ? ?PLAN:  The patient will receive 66 Gy in 33 fractions, if the PET confirms limited stage.  If the PET reveals extensive disease outside of the chest, we, will shorten the radiation course to palliate hemoptysis/airway. ? ?________________________________ ? ?Sheral Apley Tammi Klippel, M.D. ? ?

## 2022-02-03 NOTE — Progress Notes (Signed)
?Radiation Oncology         (336) [phone] ?________________________________ ? ?Initial outpatient Consultation ? ?Name: Larry Moore MRN: [id number]  ?Date of Service: 02/03/2022 DOB: 17-Apr-1951 ? ?TM:LYYTK, Purcell Nails, MD  Garner Nash, DO  ? ?REFERRING PHYSICIAN: Garner Nash, DO ? 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 ?  ICD-10-CM   ?1. Primary lung cancer with metastasis from lung to other site, right [facility])  C34.91   ?  ?2. 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  ? ? ?HISTORY OF PRESENT ILLNESS: Larry Moore is a 71 y.o. male seen at the request of Dr. Valeta Harms. [name] initially presented to his PCP with a productive cough and hemoptysis. Chest x-ray performed on 01/19/22 showed a right hilar soft tissue fullness with equivocal right base opacity and underlying hyperinflation with interstitial thickening. A chest CT was performed on 01/21/22 for further evaluation and this showed a 6.5 cm right hilar mass surrounding and compressing the right bronchus and RLL pulmonary artery as well as a nodular soft tissue lesion in the RLL, continuous with the right hilar mass. Additionally, there were enlarged mediastinal lymph nodes but no findings for distant metastatic disease.  ? ?He was referred to Dr. Delton Coombes on 01/23/22 and the recommendation was to proceed with referral to pulmonology for tissue biopsy and obtain further staging scans, including PET scan and brain MRI. The MRI brain was performed on 01/24/22 showing no metastatic disease or acute abnormality and he is scheduled for PET scan on 02/05/22 to complete his disease staging ? ?He met with Dr. Valeta Harms in consultation on 01/26/22 and proceeded to bronchoscopy with EBUS on 01/29/22. Cytology from both the right hilar mass and a subcarinal station 7 lymph node were positive for small cell carcinoma. He has been kindly referred to [place] today for discussion of  potential radiotherapeutic options in the management of his disease. ? ?PREVIOUS RADIATION THERAPY: No ? ?PAST MEDICAL HISTORY:  ?Past Medical History:  ?Diagnosis Date  ? Dyspnea   ? Dysrhythmia   ? hx a-fib  ? Hypertension   ? Insomnia   ? Pre-diabetes   ? Sleep apnea   ?   ? ?PAST SURGICAL HISTORY: ?Past Surgical History:  ?Procedure Laterality Date  ? ATRIAL FIBRILLATION ABLATION    ? approx 2020  ? CARDIAC CATHETERIZATION    ? HERNIA REPAIR    ? VIDEO BRONCHOSCOPY WITH ENDOBRONCHIAL ULTRASOUND Bilateral 01/29/2022  ? Procedure: VIDEO BRONCHOSCOPY WITH ENDOBRONCHIAL ULTRASOUND;  Surgeon: Garner Nash, DO;  Location: [place];  Service: Cardiopulmonary;  Laterality: Bilateral;  w/ Guardant [id number]  ? ? ?FAMILY HISTORY:  ?Family History  ?Problem Relation Age of Onset  ? Coronary artery disease Father   ? Diabetes Father   ? ? ?SOCIAL HISTORY:  ?Social History  ? ?Socioeconomic History  ? Marital status: Married  ?  Spouse name: Not on file  ? Number of children: Not on file  ? Years of education: Not on file  ? Highest education level: Not on file  ?Occupational History  ? Occupation: Retired  ?Tobacco Use  ? Smoking status: Every Day  ?  Types: Cigarettes  ?  Passive exposure: Current  ? Smokeless tobacco: Not on file  ? Tobacco comments:  ?  Pt smokes 1 ppd. 01/26/22  ?Vaping Use  ? Vaping Use: Never used  ?Substance and Sexual Activity  ?  Alcohol use: Not Currently  ?  Comment: 1 bottle wine a day   ? Drug use: No  ? Sexual activity: Not on file  ?Other Topics Concern  ? Not on file  ?Social History Narrative  ? Not on file  ? ?Social Determinants of Health  ? ?Financial Resource Strain: Not on file  ?Food Insecurity: Not on file  ?Transportation Needs: Not on file  ?Physical Activity: Not on file  ?Stress: Not on file  ?Social Connections: Not on file  ?Intimate Partner Violence: Not on file  ? ? ?ALLERGIES: Patient has no known allergies. ? ?MEDICATIONS:  ?Current Outpatient Medications  ?Medication Sig  Dispense Refill  ? furosemide (LASIX) 80 MG tablet Take 80 mg by mouth daily.    ? metolazone (ZAROXOLYN) 2.5 MG tablet Take 2.5 mg by mouth daily.    ? SOTALOL HCL PO Take 50 mg by mouth 2 (two) times daily.    ? Vitamin D, Ergocalciferol, (DRISDOL) 1.25 MG (50000 UNIT) CAPS capsule Take 50,000 Units by mouth daily.    ? levofloxacin (LEVAQUIN) 500 MG tablet Take 500 mg by mouth daily. (Patient not taking: Reported on 02/03/2022)    ? ?No current facility-administered medications for this encounter.  ? ? ?REVIEW OF SYSTEMS:  On review of systems, the patient reports that he is doing well overall. He denies any chest pain, fevers, chills, night sweats, unintended weight changes. He reports productive cough with scant amount of blood intermittently and chronic shortness of breath on exertion, unchanged recently.  He has remained off the Eliquis and has not seen any further hemoptysis recently.  He denies any bowel or bladder disturbances, and denies abdominal pain, nausea or vomiting. He denies any new musculoskeletal or joint aches or pains. A complete review of systems is obtained and is otherwise negative. ? ?  ?PHYSICAL EXAM:  ?Wt Readings from Last 3 Encounters:  ?02/03/22 245 lb (111.1 kg)  ?01/29/22 240 lb (108.9 kg)  ?01/26/22 242 lb (109.8 kg)  ? ?Temp Readings from Last 3 Encounters:  ?02/03/22 97.6 ?F (36.4 ?C)  ?01/29/22 97.7 ?F (36.5 ?C)  ?01/26/22 98.4 ?F (36.9 ?C) (Oral)  ? ?BP Readings from Last 3 Encounters:  ?02/03/22 102/66  ?01/29/22 (!) 97/59  ?01/26/22 136/68  ? ?Pulse Readings from Last 3 Encounters:  ?02/03/22 65  ?01/29/22 (!) 58  ?01/26/22 70  ? ?Pain Assessment ?Pain Score: 0-No pain/10 ? ?In general this is a well appearing Caucasian male in no acute distress.  He's alert and oriented x4 and appropriate throughout the examination. Cardiopulmonary assessment is negative for acute distress and he exhibits normal effort.  ? ?KPS = 90 ? ?100 - Normal; no complaints; no evidence of disease. ?90    - Able to carry on normal activity; minor signs or symptoms of disease. ?80   - Normal activity with effort; some signs or symptoms of disease. ?85   - Cares for self; unable to carry on normal activity or to do active work. ?60   - Requires occasional assistance, but is able to care for most of his personal needs. ?50   - Requires considerable assistance and frequent medical care. ?40   - Disabled; requires special care and assistance. ?30   - Severely disabled; hospital admission is indicated although death not imminent. ?20   - Very sick; hospital admission necessary; active supportive treatment necessary. ?10   - Moribund; fatal processes progressing rapidly. ?0     - Dead ? ?Karnofsky DA,  Abelmann WH, Craver LS and Burchenal [facility] [phone]) The use of the nitrogen mustards in the palliative treatment of carcinoma: with particular reference to bronchogenic carcinoma Cancer 1 634-56 ? ?LABORATORY DATA:  ?Lab Results  ?Component Value Date  ? WBC 8.7 01/29/2022  ? HGB 16.0 01/29/2022  ? HCT 46.2 01/29/2022  ? MCV 90.6 01/29/2022  ? PLT 276 01/29/2022  ? ?Lab Results  ?Component Value Date  ? NA 137 01/29/2022  ? K 3.0 (L) 01/29/2022  ? CL 99 01/29/2022  ? CO2 29 01/29/2022  ? ?No results found for: ALT, AST, GGT, ALKPHOS, BILITOT ?  ?RADIOGRAPHY: DG Chest 2 View ? ?Result Date: 01/20/2022 ?CLINICAL DATA:  Productive cough.  Ex-smoker. EXAM: CHEST - 2 VIEW COMPARISON:  12/06/2014 FINDINGS: Midline trachea. Mild cardiomegaly. Mild hyperinflation. Right hilar soft tissue fullness is most apparent on the frontal radiograph. No pleural effusion or pneumothorax. Diffuse peribronchial thickening. Possible right lower lobe pulmonary opacity, only readily apparent on the lateral view. Left base scarring. IMPRESSION: Right hilar soft tissue fullness, suspicious for adenopathy or mass. Airspace disease/pneumonia felt less likely. Consider further evaluation with contrast enhanced chest CT. Equivocal right base opacity which could  represent minimal airspace disease or possibly a pulmonary nodule. This would be better evaluated with chest CT. Underlying hyperinflation and interstitial thickening, likely related to COPD/chronic bronchitis

## 2022-02-04 DIAGNOSIS — C3490 Malignant neoplasm of unspecified part of unspecified bronchus or lung: Secondary | ICD-10-CM | POA: Diagnosis not present

## 2022-02-04 DIAGNOSIS — F17210 Nicotine dependence, cigarettes, uncomplicated: Secondary | ICD-10-CM | POA: Diagnosis not present

## 2022-02-04 DIAGNOSIS — C3411 Malignant neoplasm of upper lobe, right bronchus or lung: Secondary | ICD-10-CM | POA: Diagnosis not present

## 2022-02-04 DIAGNOSIS — Z510 Encounter for antineoplastic radiation therapy: Secondary | ICD-10-CM | POA: Diagnosis not present

## 2022-02-05 ENCOUNTER — Ambulatory Visit
Admission: RE | Admit: 2022-02-05 | Discharge: 2022-02-05 | Disposition: A | Discharge status: Home / Self Care | Payer: PPO | Source: Ambulatory Visit | Attending: Radiation Oncology | Admitting: Radiation Oncology

## 2022-02-05 ENCOUNTER — Other Ambulatory Visit: Payer: Self-pay

## 2022-02-05 ENCOUNTER — Ambulatory Visit (HOSPITAL_COMMUNITY)
Admission: RE | Admit: 2022-02-05 | Discharge: 2022-02-05 | Disposition: A | Discharge status: Home / Self Care | Payer: PPO | Source: Ambulatory Visit | Attending: Hematology | Admitting: Hematology

## 2022-02-05 DIAGNOSIS — J9811 Atelectasis: Secondary | ICD-10-CM | POA: Diagnosis not present

## 2022-02-05 DIAGNOSIS — C3490 Malignant neoplasm of unspecified part of unspecified bronchus or lung: Secondary | ICD-10-CM | POA: Insufficient documentation

## 2022-02-05 DIAGNOSIS — I3139 Other pericardial effusion (noninflammatory): Secondary | ICD-10-CM | POA: Diagnosis not present

## 2022-02-05 DIAGNOSIS — K5730 Diverticulosis of large intestine without perforation or abscess without bleeding: Secondary | ICD-10-CM | POA: Diagnosis not present

## 2022-02-05 DIAGNOSIS — I2510 Atherosclerotic heart disease of native coronary artery without angina pectoris: Secondary | ICD-10-CM | POA: Diagnosis not present

## 2022-02-05 DIAGNOSIS — F17210 Nicotine dependence, cigarettes, uncomplicated: Secondary | ICD-10-CM | POA: Diagnosis not present

## 2022-02-05 DIAGNOSIS — C3411 Malignant neoplasm of upper lobe, right bronchus or lung: Secondary | ICD-10-CM | POA: Diagnosis not present

## 2022-02-05 DIAGNOSIS — Z510 Encounter for antineoplastic radiation therapy: Secondary | ICD-10-CM | POA: Diagnosis not present

## 2022-02-05 LAB — RAD ONC ARIA SESSION SUMMARY
Course Elapsed Days: 0
Plan Fractions Treated to Date: 1
Plan Prescribed Dose Per Fraction: 2 Gy
Plan Total Fractions Prescribed: 33
Plan Total Prescribed Dose: 66 Gy
Reference Point Dosage Given to Date: 2 Gy
Reference Point Session Dosage Given: 2 Gy
Session Number: 1

## 2022-02-05 MED ORDER — FLUDEOXYGLUCOSE F - 18 (FDG) INJECTION
12.9100 | Freq: Once | INTRAVENOUS | Status: AC | PRN
  Administered 2022-02-05: 12.91 via INTRAVENOUS

## 2022-02-06 ENCOUNTER — Telehealth: Payer: Self-pay | Admitting: Acute Care

## 2022-02-06 ENCOUNTER — Encounter: Payer: Self-pay | Admitting: Acute Care

## 2022-02-06 ENCOUNTER — Ambulatory Visit (INDEPENDENT_AMBULATORY_CARE_PROVIDER_SITE_OTHER): Payer: PPO | Admitting: Acute Care

## 2022-02-06 ENCOUNTER — Other Ambulatory Visit: Payer: Self-pay

## 2022-02-06 ENCOUNTER — Ambulatory Visit
Admission: RE | Admit: 2022-02-06 | Discharge: 2022-02-06 | Disposition: A | Discharge status: Home / Self Care | Payer: PPO | Source: Ambulatory Visit | Attending: Radiation Oncology | Admitting: Radiation Oncology

## 2022-02-06 ENCOUNTER — Other Ambulatory Visit (HOSPITAL_COMMUNITY): Payer: Self-pay

## 2022-02-06 VITALS — BP 90/56 | HR 67 | Temp 97.8°F | Ht 68.0 in | Wt 241.0 lb

## 2022-02-06 DIAGNOSIS — C3490 Malignant neoplasm of unspecified part of unspecified bronchus or lung: Secondary | ICD-10-CM | POA: Diagnosis not present

## 2022-02-06 DIAGNOSIS — R42 Dizziness and giddiness: Secondary | ICD-10-CM

## 2022-02-06 DIAGNOSIS — C3411 Malignant neoplasm of upper lobe, right bronchus or lung: Secondary | ICD-10-CM | POA: Diagnosis not present

## 2022-02-06 DIAGNOSIS — Z510 Encounter for antineoplastic radiation therapy: Secondary | ICD-10-CM | POA: Diagnosis not present

## 2022-02-06 DIAGNOSIS — F17210 Nicotine dependence, cigarettes, uncomplicated: Secondary | ICD-10-CM | POA: Diagnosis not present

## 2022-02-06 LAB — RAD ONC ARIA SESSION SUMMARY
Course Elapsed Days: 1
Plan Fractions Treated to Date: 1
Plan Prescribed Dose Per Fraction: 3 Gy
Plan Total Fractions Prescribed: 10
Plan Total Prescribed Dose: 30 Gy
Reference Point Dosage Given to Date: 5 Gy
Reference Point Session Dosage Given: 3 Gy
Session Number: 2

## 2022-02-06 LAB — CBC WITH DIFFERENTIAL/PLATELET
Basophils Absolute: 0.1 10*3/uL (ref 0.0–0.1)
Basophils Relative: 0.6 % (ref 0.0–3.0)
Eosinophils Absolute: 0.3 10*3/uL (ref 0.0–0.7)
Eosinophils Relative: 2.7 % (ref 0.0–5.0)
HCT: 47.3 % (ref 39.0–52.0)
Hemoglobin: 15.7 g/dL (ref 13.0–17.0)
Lymphocytes Relative: 13.4 % (ref 12.0–46.0)
Lymphs Abs: 1.2 10*3/uL (ref 0.7–4.0)
MCHC: 33.2 g/dL (ref 30.0–36.0)
MCV: 90.8 fl (ref 78.0–100.0)
Monocytes Absolute: 0.6 10*3/uL (ref 0.1–1.0)
Monocytes Relative: 6 % (ref 3.0–12.0)
Neutro Abs: 7.2 10*3/uL (ref 1.4–7.7)
Neutrophils Relative %: 77.3 % — ABNORMAL HIGH (ref 43.0–77.0)
Platelets: 280 10*3/uL (ref 150.0–400.0)
RBC: 5.21 Mil/uL (ref 4.22–5.81)
RDW: 14.5 % (ref 11.5–15.5)
WBC: 9.3 10*3/uL (ref 4.0–10.5)

## 2022-02-06 NOTE — Patient Instructions (Addendum)
It is good to see you today. ?Follow up with Dr. Tammi Klippel for radiation treatment today. ?Follow up with Dr. Delton Coombes 02/16/2022 as is scheduled.  ?Check BP at home , and please call PCP regarding low readings.  ?They may need to adjust your beta blocker while you are undergoing treatment ?Hold onto counters if you feel dizzy.  ?Sit down if you feel dizzy ?Drink plenty of water ?We will check a CBC today to ensure you are not anemic.  ?PET scan reading is pending.  ?Follow up as needed.  ?Please contact office for sooner follow up if symptoms do not improve or worsen or seek emergency care   ?

## 2022-02-06 NOTE — Progress Notes (Signed)
? ?History of Present Illness ?Larry Moore is a 71 y.o. male current every day smoker with PMH of HTN and longstanding tobacco use. He presented with increased cough and sputum and stopped taking his Eliquis due to hemoptysis.  He had a CT scan  of the chest completed 01/21/2022  which revealed a large right-sided hilar mass with associated adenopathy concerning for advanced age bronchogenic carcinoma. He was referred to Dr. Valeta Harms for bronchoscopy and tissue sampling , which was completed  01/29/2022.  ? ?02/06/2022 ?Pt. Presents for follow up. He states he did well after his procedure. He denies any fever or chest pain, no hemoptysis.  ?He does endorse yellow gray secretions . He was treated with Levaquin for this  last week. We reviewed his Biopsy results. 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 Dr. Valeta Harms had already made referrals to radiation oncology and medical oncology. The patient was seen by Dr. Tammi Klippel on 02/03/2022, and had his first radiation treatments 02/04/2022. He has his second treatment today. He is set up to see Dr. Delton Coombes 02/16/2022. He understands he will have concurrent radiation / chemotherapy. His PET scan that was done 02/05/2022 had not been read when he was here in the office today, however has been read and shows metastatic disease. He has all follow up appointments scheduled.  ? ?Patient had some dizziness while walking to the exam room today. BP was 90/56 and we checked this twice about 30 minutes apart.  He is on a beta blocker for a fib rate control. Per his wife he has had a headache over the last week, and he usually only gets headaches when  his BP is low. His BB may need to be adjusted if this persists. I gave him fall precautions. I also secure chatted Dr. Tammi Klippel to make him aware. Of note MR brain was negative for metastatic disease.  ? ? ? ?Test Results: ?PET scan 02/05/2022 ?Right hilar mass spanning across the right upper lobe, right ?lower lobe, and right  middle lobe, hypermetabolic with maximum SUV ?of 11.6. ?2. Hypermetabolic satellite nodularity in the right lower lobe with ?hypermetabolic subcarinal and right paratracheal adenopathy in the ?chest. ?3. 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 ?4. Hypermetabolic lesion in the left adrenal gland compatible with ?adrenal metastatic lesion. ?5. Small subcutaneous hypermetabolic foci in the right posterior ?subcutaneous tissues about at the level of the iliac crests. ?Suspicious for potential metastatic lesion. ?6. Single small hypermetabolic lesions in the humerus bilaterally, ?concerning for oligometastatic disease to the skeleton. ?7. 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 ? ?Pathology 01/29/2022 ?RIGHT LUNG, HILAR MASS, BIOPSY:  ?- Small cell carcinoma  ? ?FINAL MICROSCOPIC DIAGNOSIS:  ?A. LYMPH NODE, SUBCARINAL STATION 7, FINE NEEDLE ASPIRATION:  ?- Malignant cells present  ?- Small cell carcinoma  ? ?01/24/2022 ?MRI Brain  ?No metastatic disease or acute intracranial abnormality. ?2. 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 ?3. Incidental bilateral paranasal sinus inflammation. ? ? ?CT Chest 01/21/2022 ?Large (6.5 x 5.2 cm) right hilar mass surrounding and compressing ?the right upper lobe bronchus, bronchus intermedius, right middle ?lobe bronchus and right lower lobe bronchus centrally. There is also ?compression of the right lower lobe pulmonary artery. ?2. Nodular soft tissue lesion in the right lower lobe is continuous ?with the right hilar mass and could represent endobronchial spread ?  of tumor or possibly the patient's primary lung cancer. ?3. Enlarged mediastinal lymph nodes but no findings for pulmonary ?metastatic disease. ?4. Indeterminate bilateral adrenal gland nodules. ?5.  Low-attenuation liver lesions are likely benign cysts. ?6. Recommend referral to multi disciplinary Thoracic Oncology Clinic ?and PET-CT imaging for further evaluation. ?7. Advanced three-vessel coronary artery calcifications. ? ? ?  Latest Ref Rng & Units 01/29/2022  ? 10:11 AM  ?CBC  ?WBC 4.0 - 10.5 K/uL 8.7    ?Hemoglobin 13.0 - 17.0 g/dL 16.0    ?Hematocrit 39.0 - 52.0 % 46.2    ?Platelets 150 - 400 K/uL 276    ? ? ? ?  Latest Ref Rng & Units 01/29/2022  ? 10:11 AM 01/21/2022  ?  2:11 PM  ?BMP  ?Glucose 70 - 99 mg/dL 102     ?BUN 8 - 23 mg/dL 10     ?Creatinine 0.61 - 1.24 mg/dL 0.80   1.10    ?Sodium 135 - 145 mmol/L 137     ?Potassium 3.5 - 5.1 mmol/L 3.0     ?Chloride 98 - 111 mmol/L 99     ?CO2 22 - 32 mmol/L 29     ?Calcium 8.9 - 10.3 mg/dL 8.3     ? ? ?BNP ?No results found for: BNP ? ?ProBNP ?No results found for: PROBNP ? ?PFT ?No results found for: FEV1PRE, FEV1POST, FVCPRE, FVCPOST, TLC, DLCOUNC, PREFEV1FVCRT, PSTFEV1FVCRT ? ?DG Chest 2 View ? ?Result Date: 01/20/2022 ?CLINICAL DATA:  Productive cough.  Ex-smoker. EXAM: CHEST - 2 VIEW COMPARISON:  12/06/2014 FINDINGS: Midline trachea. Mild cardiomegaly. Mild hyperinflation. Right hilar soft tissue fullness is most apparent on the frontal radiograph. No pleural effusion or pneumothorax. Diffuse peribronchial thickening. Possible right lower lobe pulmonary opacity, only readily apparent on the lateral view. Left base scarring. IMPRESSION: Right hilar soft tissue fullness, suspicious for adenopathy or mass. Airspace disease/pneumonia felt less likely. Consider further evaluation with contrast enhanced chest CT. Equivocal right base opacity which could represent minimal airspace disease or possibly a pulmonary nodule. This would be better evaluated with chest CT. Underlying hyperinflation and interstitial thickening, likely related to COPD/chronic bronchitis. These results will be called to the ordering clinician or representative by the Radiologist Assistant,  and communication documented in the PACS or [employer]. Electronically Signed   By: Abigail Miyamoto M.D.   On: 01/20/2022 15:04  ? ?CT CHEST W CONTRAST ? ?Result Date: 01/21/2022 ?CLINICAL DATA:  Hemoptysis.  Abnormal chest x-ray. EXAM: CT CHEST WITH CONTRAST TECHNIQUE: Multidetector CT imaging of the chest was performed during intravenous contrast administration. RADIATION DOSE REDUCTION: This exam was performed according to the departmental dose-optimization program which includes automated exposure control, adjustment of the mA and/or kV according to patient size and/or use of iterative reconstruction technique. CONTRAST:  41mL OMNIPAQUE IOHEXOL 300 MG/ML  SOLN COMPARISON:  Chest x-ray 01/19/2022 FINDINGS: Cardiovascular: The heart is normal in size. Small pericardial effusion. The aorta is normal in caliber. Scattered atherosclerotic calcifications but no focal aneurysm or dissection. There are advanced three-vessel coronary artery calcifications and probable LAD stent. Mediastinum/Nodes: Large (6.5 x 5.2 cm) right hilar mass surrounding and compressing the right upper lobe bronchus and the bronchus intermedius. There is also compression of the right middle lobe and left lower lobe bronchi. There is also compression of the right lower lobe pulmonary artery. 17 mm right paratracheal node on image number 53/2 and 14 mm right paratracheal node on image number 43/2. Subcarinal node measures 25 mm on  image 72/2. Lungs/Pleura: Nodular soft tissue lesion in the right lower lobe is continuous with the right hilar mass and could represent endobronchial spread of tumor or possibly the patient's primary lung cancer. Underlying emphysematous changes are noted. No pulmonary nodules to suggest pulmonary metastatic disease. Probable postobstructive atelectasis in the right middle lobe. No pleural effusion. Upper Abdomen: There are a few scattered low-attenuation hepatic lesions which are most likely benign cysts. Bilateral  adrenal gland nodules are indeterminate. There are 2 nodules on the left the lateral limb nodule measures 13 mm and medial limb nodule measures 15 mm. Right adrenal gland nodule measures 13 mm. Musc

## 2022-02-06 NOTE — Progress Notes (Signed)
Order placed for IR port placement per Dr. Delton Coombes ?

## 2022-02-06 NOTE — Telephone Encounter (Signed)
Please call patient and let him know his HGB was WNL, and this is most likely not the reason for his dizziness. Have him follow up with his PCP about adjusting his Beta Blocker if the low BP persists. Thanks so much ?

## 2022-02-09 ENCOUNTER — Ambulatory Visit
Admission: RE | Admit: 2022-02-09 | Discharge: 2022-02-09 | Disposition: A | Discharge status: Home / Self Care | Payer: PPO | Source: Ambulatory Visit | Attending: Radiation Oncology | Admitting: Radiation Oncology

## 2022-02-09 ENCOUNTER — Other Ambulatory Visit: Payer: Self-pay

## 2022-02-09 DIAGNOSIS — Z510 Encounter for antineoplastic radiation therapy: Secondary | ICD-10-CM | POA: Diagnosis not present

## 2022-02-09 LAB — RAD ONC ARIA SESSION SUMMARY
Course Elapsed Days: 4
Plan Fractions Treated to Date: 2
Plan Prescribed Dose Per Fraction: 3 Gy
Plan Total Fractions Prescribed: 10
Plan Total Prescribed Dose: 30 Gy
Reference Point Dosage Given to Date: 8 Gy
Reference Point Session Dosage Given: 3 Gy
Session Number: 3

## 2022-02-10 ENCOUNTER — Ambulatory Visit
Admission: RE | Admit: 2022-02-10 | Discharge: 2022-02-10 | Disposition: A | Discharge status: Home / Self Care | Payer: PPO | Source: Ambulatory Visit | Attending: Radiation Oncology | Admitting: Radiation Oncology

## 2022-02-10 ENCOUNTER — Other Ambulatory Visit: Payer: Self-pay

## 2022-02-10 DIAGNOSIS — Z510 Encounter for antineoplastic radiation therapy: Secondary | ICD-10-CM | POA: Diagnosis not present

## 2022-02-10 LAB — RAD ONC ARIA SESSION SUMMARY
Course Elapsed Days: 5
Plan Fractions Treated to Date: 3
Plan Prescribed Dose Per Fraction: 3 Gy
Plan Total Fractions Prescribed: 10
Plan Total Prescribed Dose: 30 Gy
Reference Point Dosage Given to Date: 11 Gy
Reference Point Session Dosage Given: 3 Gy
Session Number: 4

## 2022-02-10 NOTE — Telephone Encounter (Signed)
I have sent a Mychart message. Patient has seen results on Mychart and he was asked to call back with any questions.  ?

## 2022-02-11 ENCOUNTER — Other Ambulatory Visit: Payer: Self-pay

## 2022-02-11 ENCOUNTER — Ambulatory Visit
Admission: RE | Admit: 2022-02-11 | Discharge: 2022-02-11 | Disposition: A | Discharge status: Home / Self Care | Payer: PPO | Source: Ambulatory Visit | Attending: Radiation Oncology | Admitting: Radiation Oncology

## 2022-02-11 DIAGNOSIS — Z510 Encounter for antineoplastic radiation therapy: Secondary | ICD-10-CM | POA: Diagnosis not present

## 2022-02-11 DIAGNOSIS — C3411 Malignant neoplasm of upper lobe, right bronchus or lung: Secondary | ICD-10-CM | POA: Diagnosis not present

## 2022-02-11 DIAGNOSIS — F17210 Nicotine dependence, cigarettes, uncomplicated: Secondary | ICD-10-CM | POA: Diagnosis not present

## 2022-02-11 LAB — RAD ONC ARIA SESSION SUMMARY
Course Elapsed Days: 6
Plan Fractions Treated to Date: 4
Plan Prescribed Dose Per Fraction: 3 Gy
Plan Total Fractions Prescribed: 10
Plan Total Prescribed Dose: 30 Gy
Reference Point Dosage Given to Date: 14 Gy
Reference Point Session Dosage Given: 3 Gy
Session Number: 5

## 2022-02-12 ENCOUNTER — Other Ambulatory Visit: Payer: Self-pay

## 2022-02-12 ENCOUNTER — Ambulatory Visit: Payer: PPO

## 2022-02-12 ENCOUNTER — Ambulatory Visit
Admission: RE | Admit: 2022-02-12 | Discharge: 2022-02-12 | Disposition: A | Discharge status: Home / Self Care | Payer: PPO | Source: Ambulatory Visit | Attending: Radiation Oncology | Admitting: Radiation Oncology

## 2022-02-12 DIAGNOSIS — Z510 Encounter for antineoplastic radiation therapy: Secondary | ICD-10-CM | POA: Diagnosis not present

## 2022-02-12 LAB — RAD ONC ARIA SESSION SUMMARY
Course Elapsed Days: 7
Plan Fractions Treated to Date: 5
Plan Prescribed Dose Per Fraction: 3 Gy
Plan Total Fractions Prescribed: 10
Plan Total Prescribed Dose: 30 Gy
Reference Point Dosage Given to Date: 17 Gy
Reference Point Session Dosage Given: 3 Gy
Session Number: 6

## 2022-02-13 ENCOUNTER — Other Ambulatory Visit: Payer: Self-pay

## 2022-02-13 ENCOUNTER — Ambulatory Visit
Admission: RE | Admit: 2022-02-13 | Discharge: 2022-02-13 | Disposition: A | Discharge status: Home / Self Care | Payer: PPO | Source: Ambulatory Visit | Attending: Radiation Oncology | Admitting: Radiation Oncology

## 2022-02-13 DIAGNOSIS — Z510 Encounter for antineoplastic radiation therapy: Secondary | ICD-10-CM | POA: Diagnosis not present

## 2022-02-13 LAB — RAD ONC ARIA SESSION SUMMARY
Course Elapsed Days: 8
Plan Fractions Treated to Date: 6
Plan Prescribed Dose Per Fraction: 3 Gy
Plan Total Fractions Prescribed: 10
Plan Total Prescribed Dose: 30 Gy
Reference Point Dosage Given to Date: 20 Gy
Reference Point Session Dosage Given: 3 Gy
Session Number: 7

## 2022-02-13 NOTE — Telephone Encounter (Signed)
Patient has viewed results on mychart and did not respond back. Closing encounter.  ?

## 2022-02-16 ENCOUNTER — Other Ambulatory Visit: Payer: Self-pay

## 2022-02-16 ENCOUNTER — Ambulatory Visit
Admission: RE | Admit: 2022-02-16 | Discharge: 2022-02-16 | Disposition: A | Discharge status: Home / Self Care | Payer: PPO | Source: Ambulatory Visit | Attending: Radiation Oncology | Admitting: Radiation Oncology

## 2022-02-16 ENCOUNTER — Other Ambulatory Visit: Payer: Self-pay | Admitting: Radiology

## 2022-02-16 ENCOUNTER — Inpatient Hospital Stay (HOSPITAL_COMMUNITY): Payer: PPO | Attending: Hematology | Admitting: Hematology

## 2022-02-16 VITALS — Wt 235.0 lb

## 2022-02-16 DIAGNOSIS — Z79899 Other long term (current) drug therapy: Secondary | ICD-10-CM | POA: Insufficient documentation

## 2022-02-16 DIAGNOSIS — C3411 Malignant neoplasm of upper lobe, right bronchus or lung: Secondary | ICD-10-CM

## 2022-02-16 DIAGNOSIS — Z5112 Encounter for antineoplastic immunotherapy: Secondary | ICD-10-CM | POA: Insufficient documentation

## 2022-02-16 DIAGNOSIS — C3481 Malignant neoplasm of overlapping sites of right bronchus and lung: Secondary | ICD-10-CM | POA: Insufficient documentation

## 2022-02-16 DIAGNOSIS — Z5111 Encounter for antineoplastic chemotherapy: Secondary | ICD-10-CM | POA: Insufficient documentation

## 2022-02-16 DIAGNOSIS — Z510 Encounter for antineoplastic radiation therapy: Secondary | ICD-10-CM | POA: Diagnosis not present

## 2022-02-16 DIAGNOSIS — Z5189 Encounter for other specified aftercare: Secondary | ICD-10-CM | POA: Insufficient documentation

## 2022-02-16 LAB — RAD ONC ARIA SESSION SUMMARY
Course Elapsed Days: 11
Plan Fractions Treated to Date: 7
Plan Prescribed Dose Per Fraction: 3 Gy
Plan Total Fractions Prescribed: 10
Plan Total Prescribed Dose: 30 Gy
Reference Point Dosage Given to Date: 23 Gy
Reference Point Session Dosage Given: 3 Gy
Session Number: 8

## 2022-02-16 NOTE — H&P (Signed)
Chief Complaint: Patient was seen in consultation today for tunneled catheter with port placed Katragadda,Sreedhar  Referring Physician(s): Katragadda,Sreedhar  Supervising Physician: Marliss Coots  Patient Status: [facility] - Out-pt  History of Present Illness: Larry Moore is a 71 y.o. male with HTN, A-fib and sleep apnea.  Patient complained of hemoptysis and cough and had CT chest that showed right lung mass 01/19/2022.  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  Patient was referred by Dr. Ellin Saba for tunneled catheter with port placement to receive chemotherapy.  Past Medical History:  Diagnosis Date   Dyspnea    Dysrhythmia    hx a-fib   Hypertension    Insomnia    Pre-diabetes    Sleep apnea     Past Surgical History:  Procedure Laterality Date   ATRIAL FIBRILLATION ABLATION     approx 2020   CARDIAC CATHETERIZATION     HERNIA REPAIR     VIDEO BRONCHOSCOPY WITH ENDOBRONCHIAL ULTRASOUND Bilateral 01/29/2022   Procedure: VIDEO BRONCHOSCOPY WITH ENDOBRONCHIAL ULTRASOUND;  Surgeon: Josephine Igo, DO;  Location: [facility] OR;  Service: Cardiopulmonary;  Laterality: Bilateral;  w/ Guardant [id number]    Allergies: Patient has no known allergies.  Medications: Prior to Admission medications   Medication Sig Start Date End Date Taking? Authorizing Provider  furosemide (LASIX) 80 MG tablet Take 80 mg by mouth daily.    [provider]  metolazone (ZAROXOLYN) 2.5 MG tablet Take 2.5 mg by mouth daily.    [provider]  SOTALOL HCL PO Take 50 mg by mouth 2 (two) times daily.    [provider]  Vitamin D, Ergocalciferol, (DRISDOL) 1.25 MG (50000 UNIT) CAPS capsule Take 50,000 Units by mouth daily. 01/22/22   [provider]     Family History  Problem Relation Age of Onset   Coronary artery disease Father    Diabetes Father     Social History    Socioeconomic History   Marital status: Married    Spouse name: Not on file   Number of children: Not on file   Years of education: Not on file   Highest education level: Not on file  Occupational History   Occupation: Retired  Tobacco Use   Smoking status: Every Day    Types: Cigarettes    Passive exposure: Current   Smokeless tobacco: Not on file   Tobacco comments:    Pt smokes 1 ppd. 01/26/22  Vaping Use   Vaping Use: Never used  Substance and Sexual Activity   Alcohol use: Not Currently    Comment: 1 bottle wine a day    Drug use: No   Sexual activity: Not on file  Other Topics Concern   Not on file  Social History Narrative   Not on file   Social Determinants of Health   Financial Resource Strain: Not on file  Food Insecurity: Not on file  Transportation Needs: Not on file  Physical Activity: Not on file  Stress: Not on file  Social Connections: Not on file     Review of Systems: A 12 point ROS discussed and pertinent positives are indicated in the HPI above.  All other systems are negative.  Review of Systems  Constitutional:  Positive for fatigue. Negative for chills and fever.  Respiratory:  Positive for cough and shortness of breath.   Cardiovascular:  Positive for leg swelling. Negative for chest pain.  Gastrointestinal:  Negative for abdominal pain, nausea and vomiting.  Neurological:  Negative for dizziness, weakness and headaches.   Vital Signs: BP 116/74   Pulse 69   Temp 97.8 F (36.6 C) (Oral)   Resp 18   Ht 5\' 8"  (1.727 m)   Wt 235 lb (106.6 kg)   SpO2 93%   BMI 35.73 kg/m   Physical Exam Constitutional:      General: He is not in acute distress.    Appearance: Normal appearance. He is not ill-appearing.  HENT:     Head: Normocephalic and atraumatic.     Mouth/Throat:     Mouth: Mucous membranes are moist.     Pharynx: Oropharynx is clear.  Eyes:     Extraocular Movements: Extraocular movements intact.     Pupils: Pupils are  equal, round, and reactive to light.  Cardiovascular:     Rate and Rhythm: Normal rate and regular rhythm.     Pulses: Normal pulses.     Heart sounds: Normal heart sounds.  Pulmonary:     Effort: Pulmonary effort is normal. No respiratory distress.     Breath sounds: Normal breath sounds.  Abdominal:     General: Bowel sounds are normal. There is no distension.     Palpations: Abdomen is soft.     Tenderness: There is no abdominal tenderness. There is no guarding.  Musculoskeletal:     Right lower leg: Edema present.     Left lower leg: Edema present.  Skin:    General: Skin is warm and dry.  Neurological:     Mental Status: He is alert and oriented to person, place, and time.  Psychiatric:        Mood and Affect: Mood normal.        Behavior: Behavior normal.        Thought Content: Thought content normal.        Judgment: Judgment normal.    Imaging: DG Chest 2 View  Result Date: 01/20/2022 CLINICAL DATA:  Productive cough.  Ex-smoker. EXAM: CHEST - 2 VIEW COMPARISON:  12/06/2014 FINDINGS: Midline trachea. Mild cardiomegaly. Mild hyperinflation. Right hilar soft tissue fullness is most apparent on the frontal radiograph. No pleural effusion or pneumothorax. Diffuse peribronchial thickening. Possible right lower lobe pulmonary opacity, only readily apparent on the lateral view. Left base scarring. IMPRESSION: Right hilar soft tissue fullness, suspicious for adenopathy or mass. Airspace disease/pneumonia felt less likely. Consider further evaluation with contrast enhanced chest CT. Equivocal right base opacity which could represent minimal airspace disease or possibly a pulmonary nodule. This would be better evaluated with chest CT. Underlying hyperinflation and interstitial thickening, likely related to COPD/chronic bronchitis. These results will be called to the ordering clinician or representative by the Radiologist Assistant, and communication documented in the PACS or [employer]. Electronically Signed   By: Jeronimo Greaves M.D.   On: 01/20/2022 15:04   CT CHEST W CONTRAST  Result Date: 01/21/2022 CLINICAL DATA:  Hemoptysis.  Abnormal chest x-ray. EXAM: CT CHEST WITH CONTRAST TECHNIQUE: Multidetector CT imaging of the chest was performed during intravenous contrast administration. RADIATION DOSE REDUCTION: This exam was performed according to the departmental dose-optimization program which includes automated exposure control, adjustment of the mA and/or kV according to patient size and/or use of iterative reconstruction technique. CONTRAST:  75mL OMNIPAQUE IOHEXOL 300 MG/ML  SOLN COMPARISON:  Chest x-ray 01/19/2022 FINDINGS: Cardiovascular: The heart is normal in size. Small pericardial effusion. The aorta is normal in caliber. Scattered atherosclerotic calcifications  but no focal aneurysm or dissection. There are advanced three-vessel coronary artery calcifications and probable LAD stent. Mediastinum/Nodes: Large (6.5 x 5.2 cm) right hilar mass surrounding and compressing the right upper lobe bronchus and the bronchus intermedius. There is also compression of the right middle lobe and left lower lobe bronchi. There is also compression of the right lower lobe pulmonary artery. 17 mm right paratracheal node on image number 53/2 and 14 mm right paratracheal node on image number 43/2. Subcarinal node measures 25 mm on image 72/2. Lungs/Pleura: Nodular soft tissue lesion in the right lower lobe is continuous with the right hilar mass and could represent endobronchial spread of tumor or possibly the patient's primary lung cancer. Underlying emphysematous changes are noted. No pulmonary nodules to suggest pulmonary metastatic disease. Probable postobstructive atelectasis in the right middle lobe. No pleural effusion. Upper Abdomen: There are a few scattered low-attenuation hepatic lesions which are most likely benign cysts. Bilateral adrenal gland nodules are indeterminate. There are 2  nodules on the left the lateral limb nodule measures 13 mm and medial limb nodule measures 15 mm. Right adrenal gland nodule measures 13 mm. Musculoskeletal: No chest wall mass, supraclavicular or axillary adenopathy. The bony thorax is intact. No worrisome lytic or sclerotic bone lesions. There is a remote healed left posterior rib fracture noted. IMPRESSION: 1. Large (6.5 x 5.2 cm) right hilar mass surrounding and compressing the right upper lobe bronchus, bronchus intermedius, right middle lobe bronchus and right lower lobe bronchus centrally. There is also compression of the right lower lobe pulmonary artery. 2. Nodular soft tissue lesion in the right lower lobe is continuous with the right hilar mass and could represent endobronchial spread of tumor or possibly the patient's primary lung cancer. 3. Enlarged mediastinal lymph nodes but no findings for pulmonary metastatic disease. 4. Indeterminate bilateral adrenal gland nodules. 5. Low-attenuation liver lesions are likely benign cysts. 6. Recommend referral to multi disciplinary Thoracic Oncology Clinic and PET-CT imaging for further evaluation. 7. Advanced three-vessel coronary artery calcifications. Aortic Atherosclerosis (ICD10-I70.0) and Emphysema (ICD10-J43.9). Electronically Signed   By: Rudie Meyer M.D.   On: 01/21/2022 15:08   MR Brain W Wo Contrast  Result Date: 01/24/2022 CLINICAL DATA:  71 year old male with hemoptysis and large right hilar lung mass on recent Chest CT. Lung cancer staging. EXAM: MRI HEAD WITHOUT AND WITH CONTRAST TECHNIQUE: Multiplanar, multiecho pulse sequences of the brain and surrounding structures were obtained without and with intravenous contrast. CONTRAST:  10mL GADAVIST GADOBUTROL 1 MMOL/ML IV SOLN COMPARISON:  Chest CT 01/21/2022. FINDINGS: Brain: No abnormal enhancement identified. No dural thickening. No midline shift, mass effect, or evidence of intracranial mass lesion. Overall cerebral volume seems within normal  limits for age. There are chronic lacunar infarcts of the posterior right lentiform (with hemosiderin) and right central pons. No restricted diffusion to suggest acute infarction. No ventriculomegaly, extra-axial collection or acute intracranial hemorrhage. Cervicomedullary junction and pituitary are within normal limits. No other chronic cerebral blood products identified. Elsewhere normal for age gray and white matter signal. No cortical encephalomalacia identified. Vascular: Major intracranial vascular flow voids are preserved with generalized intracranial artery dolichoectasia. The major dural venous sinuses are enhancing and appear to be patent. Skull and upper cervical spine: Negative for age visible cervical spine and spinal cord. Visualized bone marrow signal is within normal limits. Sinuses/Orbits: Postoperative changes to both globes. Fairly extensive bilateral ethmoid and maxillary sinus mucosal thickening and enhancement. Right frontoethmoidal recess and right frontal sinus is involved. Sphenoid sinuses  are spared. No sinus fluid levels. Other: Grossly normal visible internal auditory structures, tortuous right AICA loops into the right internal auditory canal on series 16, images 39 and 41. Mastoids are clear. Negative stylomastoid foramina. Negative visible scalp and face. IMPRESSION: 1. No metastatic disease or acute intracranial abnormality. 2. 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 3. Incidental bilateral paranasal sinus inflammation. Electronically Signed   By: Odessa Fleming M.D.   On: 01/24/2022 10:51   NM PET Image Initial (PI) Skull Base To Thigh (F-18 FDG)  Result Date: 02/06/2022 CLINICAL DATA:  Initial treatment strategy for right hilar mass. EXAM: NUCLEAR MEDICINE PET SKULL BASE TO THIGH TECHNIQUE: 12.9 mCi F-18 FDG was injected intravenously. Full-ring PET imaging was performed from the skull base to thigh after the radiotracer. CT data was obtained and used for  attenuation correction and anatomic localization. Fasting blood glucose: 93 mg/dl COMPARISON:  Chest CT 16/07/9603 FINDINGS: Mediastinal blood pool activity: SUV max 2.7 Liver activity: SUV max NA NECK: No significant abnormal hypermetabolic activity in this region. Incidental CT findings: Chronic bilateral maxillary, bilateral ethmoid, and left frontal sinusitis. Left common carotid atherosclerotic calcification. CHEST: Hypermetabolic right hilar mass spanning across the right upper lobe, right lower lobe, and right middle lobe with associated occlusion or near occlusion of the right middle lobe and right upper lobe bronchi, maximum SUV 11.6. Posterior satellite nodules including a dominant 3.3 by 2.8 cm superior segment right lower lobe satellite nodule with maximum SUV 9.7. Subcarinal node 2.8 cm in short axis on image 49 series 7, maximum SUV 9.2. 1.8 cm right paratracheal lymph node on image 28 of series 7 has maximum SUV of 7.6. The posterior right paratracheal lymph node on image 36 series 7 measures 1.4 cm in diameter with maximum SUV 8.9. A 1.3 by 0.5 cm sessile pleural soft tissue density posteriorly along the right costophrenic angle on image 77 of series 7 is hypermetabolic with maximum SUV 6.0 compatible with a small pleural metastatic lesion. Low-density paraspinal masses along the lower thoracic spine at about the T10 level have low-grade metabolic activity with the right-sided 2.8 by 1.9 cm mass with maximum SUV of 2.8 and the left 2.9 by 2.3 cm mass maximum SUV of 3.2. Location and low-density with potential interspersed fatty elements favor extramedullary hematopoiesis. Incidental CT findings: Right middle lobe atelectasis. Coronary, aortic arch, and branch vessel atherosclerotic vascular disease. Mild cardiomegaly. Small pericardial effusion. ABDOMEN/PELVIS: 2.3 by 1.5 cm mass of the lateral limb left adrenal gland on image 162 series 3 with internal density of 45 Hounsfield units and maximum SUV  8.9, compatible with adrenal metastatic lesion. There is also a 1.1 by 1.7 cm mass of the medial limb of the left adrenal gland with internal density of -8 Hounsfield units, compatible with adenoma. Subtle nodularity of the right adrenal gland noted without hypermetabolic activity or true mass. In the right posterior subcutaneous tissues about at the level of the iliac crest, a 1.3 by 1.0 cm soft tissue density nodule on image 204 of series 3 has maximum SUV of 5.0. Incidental CT findings: The hypodense hepatic lesions on the noncontrast CT dated demonstrate no hypermetabolic activity and are likely benign lesions such as cysts. Atherosclerosis is present, including aortoiliac atherosclerotic disease. Descending and sigmoid colon diverticulosis. SKELETON: A 1.3 cm lesion proximally in the right humerus on image 69 of series 3 is hypermetabolic with maximum SUV 5.9, favoring metastatic lesion. In the left mid humerus, a small focus of  accentuated metabolic activity and accentuated density is present about at the level of image 103 of series 3, maximum SUV 3.4. No other hypermetabolic skeletal foci are identified. Incidental CT findings: Old healed left anterior rib fractures. Spondylosis noted. IMPRESSION: 1. Right hilar mass spanning across the right upper lobe, right lower lobe, and right middle lobe, hypermetabolic with maximum SUV of 11.6. 2. Hypermetabolic satellite nodularity in the right lower lobe with hypermetabolic subcarinal and right paratracheal adenopathy in the chest. 3. Sessile hypermetabolic pleural-based nodule along the right posterior costophrenic angle compatible with pleural metastatic disease. 4. Hypermetabolic lesion in the left adrenal gland compatible with adrenal metastatic lesion. 5. Small subcutaneous hypermetabolic foci in the right posterior subcutaneous tissues about at the level of the iliac crests. Suspicious for potential metastatic lesion. 6. Single small hypermetabolic lesions in  the humerus bilaterally, concerning for oligometastatic disease to the skeleton. 7. Fairly low level activity in the low-density paraspinal masses at the T10 vertebral level, appearance and location favors extramedullary hematopoiesis. 8. Other imaging findings of potential clinical significance: Chronic paranasal sinusitis. Aortic Atherosclerosis (ICD10-I70.0). Coronary atherosclerosis. Small pericardial effusion indeterminate for malignant involvement. Mild cardiomegaly. Hepatic hypodense lesions are not hypermetabolic and are likely benign. Descending and sigmoid colon diverticulosis. Spondylosis. Electronically Signed   By: Gaylyn Rong M.D.   On: 02/06/2022 14:48    Labs:  CBC: Recent Labs    01/29/22 1011 02/06/22 1109  WBC 8.7 9.3  HGB 16.0 15.7  HCT 46.2 47.3  PLT 276 280.0    COAGS: No results for input(s): INR, APTT in the last 8760 hours.  BMP: Recent Labs    01/21/22 1411 01/29/22 1011  NA  --  137  K  --  3.0*  CL  --  99  CO2  --  29  GLUCOSE  --  102*  BUN  --  10  CALCIUM  --  8.3*  CREATININE 1.10 0.80  GFRNONAA  --  >60    LIVER FUNCTION TESTS: No results for input(s): BILITOT, AST, ALT, ALKPHOS, PROT, ALBUMIN in the last 8760 hours.  TUMOR MARKERS: No results for input(s): AFPTM, CEA, CA199, CHROMGRNA in the last 8760 hours.  Assessment and Plan: History of HTN, A-fib and sleep apnea.  Patient complained of hemoptysis and cough and had CT chest that showed right lung mass 01/19/2022.  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  Patient was referred by Dr. Ellin Saba for tunneled catheter with port placement to receive chemotherapy.  Pt resting on stretcher watching TV. He is A&O, calm and pleasant.  He is in no distress.  Pt states he is NPO per order. No AC/AP on chart.   Risks and benefits of image guided tunneled catheter with port placement with moderate  sedation was discussed with the patient including, but not limited to bleeding, infection, pneumothorax, or fibrin sheath development and need for additional procedures.  All of the patient's questions were answered, patient is agreeable to proceed. Consent signed and in chart.   Thank you for this interesting consult.  I greatly enjoyed meeting Larry Moore and look forward to participating in their care.  A copy of this report was sent to the requesting provider on this date.  Electronically Signed: Shon Hough, NP 02/17/2022, 1:05 PM   I spent a total of 20 minutes in face to face in clinical consultation, greater than 50% of which was counseling/coordinating care  for tunneled catheter with port placement.

## 2022-02-16 NOTE — Progress Notes (Signed)
START ON PATHWAY REGIMEN - Small Cell Lung ? ? ?  Cycles 1 through 4, every 21 days: ?    Atezolizumab  ?    Carboplatin  ?    Etoposide  ?  Cycles 5 and beyond, every 21 days: ?    Atezolizumab  ? ?**Always confirm dose/schedule in your pharmacy ordering system** ? ?Patient Characteristics: ?Newly Diagnosed, Preoperative or Nonsurgical Candidate (Clinical Staging), First Line, Extensive Stage ?Therapeutic Status: Newly Diagnosed, Preoperative or Nonsurgical Candidate (Clinical Staging) ?AJCC T Category: cT4 ?AJCC N Category: cN2 ?AJCC M Category: cM1c ?AJCC 8 Stage Grouping: IVB ?Stage Classification: Extensive ? ?Intent of Therapy: ?Non-Curative / Palliative Intent, Discussed with Patient ?

## 2022-02-16 NOTE — Progress Notes (Signed)
Virtual Visit via Telephone Note ? ?I connected with Larry Moore on 02/16/22 at  3:45 PM EDT by telephone and verified that I am speaking with the correct person using two identifiers. ? ?Location: ?Patient: [name] ?Provider: In the office ?  ?I discussed the limitations, risks, security and privacy concerns of performing an evaluation and management service by telephone and the availability of in person appointments. I also discussed with the patient that there may be a patient responsible charge related to this service. The patient expressed understanding and agreed to proceed. ? ? 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  He had undergone bronchoscopy by Dr. Valeta Harms on 01/29/2022 which showed endobronchial tumor, pathology consistent with small cell lung cancer.  He was started on palliative radiation therapy for hemoptysis. ?  ?Observations/Objective: ?He reports improvement in hemoptysis/cough since radiation therapy started.  He does not report any significant fatigue.  Energy levels are 75%. ? ?Assessment and Plan: ? ?1.  Extensive stage small cell lung cancer: ?- I have reviewed the PET scan results which showed right hilar mass involving upper lobe, middle lobe and lower lobe with satellite nodularity in the right lower lobe, hypermetabolic subcarinal and right paratracheal lymphadenopathy.  Pleural-based nodule along the right posterior costophrenic angle.  Hypermetabolic left adrenal lesion.  Small hypermetabolic lesions in the humerus bilaterally and a subcutaneous focus in the right posterior lower back. ?- We have discussed palliative treatment with chemoimmunotherapy. ?- We discussed carboplatin, etoposide and Atezolizumab every 3 weeks for 4 cycles followed by maintenance atezolizumab. ?- He will finish radiation therapy on 02/19/2022. ?- We will plan to initiate cycle 1 next week. ?- He is having port placed tomorrow. ? ? ?Follow Up  Instructions: ?RTC week of 02/23/2022 to initiate cycle 1 with labs. ?  ?I discussed the assessment and treatment plan with the patient. The patient was provided an opportunity to ask questions and all were answered. The patient agreed with the plan and demonstrated an understanding of the instructions. ?  ?The patient was advised to call back or seek an in-person evaluation if the symptoms worsen or if the condition fails to improve as anticipated. ? ?I provided 25 minutes of non-face-to-face time during this encounter. ? ? ?[name], MD ? ? ?

## 2022-02-17 ENCOUNTER — Ambulatory Visit (HOSPITAL_COMMUNITY)
Admission: RE | Admit: 2022-02-17 | Discharge: 2022-02-17 | Disposition: A | Discharge status: Home / Self Care | Payer: PPO | Source: Ambulatory Visit | Attending: Hematology | Admitting: Hematology

## 2022-02-17 ENCOUNTER — Encounter (HOSPITAL_COMMUNITY): Payer: Self-pay

## 2022-02-17 ENCOUNTER — Other Ambulatory Visit: Payer: Self-pay

## 2022-02-17 ENCOUNTER — Ambulatory Visit
Admission: RE | Admit: 2022-02-17 | Discharge: 2022-02-17 | Disposition: A | Discharge status: Home / Self Care | Payer: PPO | Source: Ambulatory Visit | Attending: Radiation Oncology | Admitting: Radiation Oncology

## 2022-02-17 ENCOUNTER — Other Ambulatory Visit (HOSPITAL_COMMUNITY): Payer: Self-pay | Admitting: Hematology

## 2022-02-17 ENCOUNTER — Other Ambulatory Visit (HOSPITAL_COMMUNITY): Payer: Self-pay

## 2022-02-17 DIAGNOSIS — Z452 Encounter for adjustment and management of vascular access device: Secondary | ICD-10-CM | POA: Diagnosis not present

## 2022-02-17 DIAGNOSIS — C3411 Malignant neoplasm of upper lobe, right bronchus or lung: Secondary | ICD-10-CM

## 2022-02-17 DIAGNOSIS — I10 Essential (primary) hypertension: Secondary | ICD-10-CM | POA: Diagnosis not present

## 2022-02-17 DIAGNOSIS — C3491 Malignant neoplasm of unspecified part of right bronchus or lung: Secondary | ICD-10-CM | POA: Diagnosis not present

## 2022-02-17 DIAGNOSIS — I4891 Unspecified atrial fibrillation: Secondary | ICD-10-CM | POA: Diagnosis not present

## 2022-02-17 DIAGNOSIS — Z510 Encounter for antineoplastic radiation therapy: Secondary | ICD-10-CM | POA: Diagnosis not present

## 2022-02-17 DIAGNOSIS — F17210 Nicotine dependence, cigarettes, uncomplicated: Secondary | ICD-10-CM | POA: Insufficient documentation

## 2022-02-17 DIAGNOSIS — G4730 Sleep apnea, unspecified: Secondary | ICD-10-CM | POA: Insufficient documentation

## 2022-02-17 DIAGNOSIS — C3490 Malignant neoplasm of unspecified part of unspecified bronchus or lung: Secondary | ICD-10-CM

## 2022-02-17 HISTORY — PX: IR IMAGING GUIDED PORT INSERTION: IMG5740

## 2022-02-17 HISTORY — PX: IR VENO/JUGULAR RIGHT: IMG2275

## 2022-02-17 LAB — RAD ONC ARIA SESSION SUMMARY
Course Elapsed Days: 12
Plan Fractions Treated to Date: 8
Plan Prescribed Dose Per Fraction: 3 Gy
Plan Total Fractions Prescribed: 10
Plan Total Prescribed Dose: 30 Gy
Reference Point Dosage Given to Date: 26 Gy
Reference Point Session Dosage Given: 3 Gy
Session Number: 9

## 2022-02-17 MED ORDER — HEPARIN SOD (PORK) LOCK FLUSH 100 UNIT/ML IV SOLN
INTRAVENOUS | Status: AC | PRN
  Administered 2022-02-17: 500 [IU] via INTRAVENOUS

## 2022-02-17 MED ORDER — MIDAZOLAM HCL 2 MG/2ML IJ SOLN
INTRAMUSCULAR | Status: AC
  Filled 2022-02-17: qty 2

## 2022-02-17 MED ORDER — MIDAZOLAM HCL 2 MG/2ML IJ SOLN
INTRAMUSCULAR | Status: AC
  Filled 2022-02-17: qty 4

## 2022-02-17 MED ORDER — SODIUM CHLORIDE 0.9 % IV SOLN: INTRAVENOUS | Status: DC

## 2022-02-17 MED ORDER — MIDAZOLAM HCL 2 MG/2ML IJ SOLN
INTRAMUSCULAR | Status: AC | PRN
Start: 2022-02-17 — End: 2022-02-17
  Administered 2022-02-17 (×4): 1 mg via INTRAVENOUS

## 2022-02-17 MED ORDER — LIDOCAINE-EPINEPHRINE 1 %-1:100000 IJ SOLN
INTRAMUSCULAR | Status: AC
  Filled 2022-02-17: qty 1

## 2022-02-17 MED ORDER — FENTANYL CITRATE (PF) 100 MCG/2ML IJ SOLN
INTRAMUSCULAR | Status: AC
  Filled 2022-02-17: qty 2

## 2022-02-17 MED ORDER — FENTANYL CITRATE (PF) 100 MCG/2ML IJ SOLN
INTRAMUSCULAR | Status: AC | PRN
  Administered 2022-02-17 (×4): 50 ug via INTRAVENOUS

## 2022-02-17 MED ORDER — LIDOCAINE-EPINEPHRINE 1 %-1:100000 IJ SOLN
INTRAMUSCULAR | Status: AC | PRN
  Administered 2022-02-17: 15 mL via INTRADERMAL
  Administered 2022-02-17: 20 mL via INTRADERMAL

## 2022-02-17 MED ORDER — HEPARIN SOD (PORK) LOCK FLUSH 100 UNIT/ML IV SOLN
INTRAVENOUS | Status: AC
  Filled 2022-02-17: qty 5

## 2022-02-17 NOTE — Procedures (Signed)
Interventional Radiology Procedure Note ? ?Procedure: Single Lumen Power Port Placement   ? ?Access:  Left internal jugular vein ? ?Findings: Chronically occluded right IJ.  Catheter tip positioned at cavoatrial junction. Port is ready for immediate use.  ? ?Complications: None ? ?EBL: < 10 mL ? ?Recommendations:  ?- Ok to shower in 24 hours ?- Do not submerge for 7 days ?- Routine line care  ? ? ?Ruthann Cancer, MD ?Pager: [phone] ? ? ? ?

## 2022-02-17 NOTE — Discharge Instructions (Addendum)
? ? ?Discharge Instructions: ? ?Please call [facility] Radiology clinic [phone] with any questions or concerns. ? ?You may remove your dressing and shower tomorrow. Replace with clean bandaid as needed.  Do not submerge in tub or water until site healed ? ?Do not use EMLA / Lidocaine cream (other creams ointments) for 2 weeks (or until site has healed). ? ? ? Implanted Port Insertion, Care After ?The following information offers guidance on how to care for yourself after your procedure. Your health care provider may also give you more specific instructions. If you have problems or questions, contact your health care provider. ?What can I expect after the procedure? ?After the procedure, it is common to have: ?Discomfort at the port insertion site. ?Bruising on the skin over the port. This should improve over 3-4 days. ?Follow these instructions at home: ?Port care ?After your port is placed, you will get a manufacturer's information card. The card has information about your port. Keep this card with you at all times. ?Take care of the port as told by your health care provider. Ask your health care provider if you or a family member can get training for taking care of the port at home. ?A home health care nurse will be be available to help care for the port. ?Make sure to remember what type of port you have. ?Incision care ? ?  ? ?Follow instructions from your health care provider about how to take care of your port insertion site. Make sure you: ?Wash your hands with soap and water for at least 20 seconds before and after you change your bandage (dressing). If soap and water are not available, use hand sanitizer. ?Change your dressing as told by your health care provider. ?Leave stitches (sutures), skin glue, or adhesive strips in place. These skin closures may need to stay in place for 2 weeks or longer. If adhesive strip edges start to loosen and curl up, you may trim the loose edges. Do not remove  adhesive strips completely unless your health care provider tells you to do that. ?Check your port insertion site every day for signs of infection. Check for: ?Redness, swelling, or pain. ?Fluid or blood. ?Warmth. ?Pus or a bad smell. ?Activity ?Return to your normal activities as told by your health care provider. Ask your health care provider what activities are safe for you. ?You may have to avoid lifting. Ask your health care provider how much you can safely lift. ?General instructions ?Take over-the-counter and prescription medicines only as told by your health care provider. ?Do not take baths, swim, or use a hot tub until your health care provider approves. Ask your health care provider if you may take showers. You may only be allowed to take sponge baths. ?If you were given a sedative during the procedure, it can affect you for several hours. Do not drive or operate machinery until your health care provider says that it is safe. ?Wear a medical alert bracelet in case of an emergency. This will tell any health care providers that you have a port. ?Keep all follow-up visits. This is important. ?Contact a health care provider if: ?You cannot flush your port with saline as directed, or you cannot draw blood from the port. ?You have a fever or chills. ?You have redness, swelling, or pain around your port insertion site. ?You have fluid or blood coming from your port insertion site. ?Your port insertion site feels warm to the touch. ?You have pus or a bad smell  coming from the port insertion site. ?Get help right away if: ?You have chest pain or shortness of breath. ?You have bleeding from your port that you cannot control. ?These symptoms may be an emergency. Get help right away. Call 911. ?Do not wait to see if the symptoms will go away. ?Do not drive yourself to the hospital. ?Summary ?Take care of the port as told by your health care provider. Keep the manufacturer's information card with you at all  times. ?Change your dressing as told by your health care provider. ?Contact a health care provider if you have a fever or chills or if you have redness, swelling, or pain around your port insertion site. ?Keep all follow-up visits. ?This information is not intended to replace advice given to you by your health care provider. Make sure you discuss any questions you have with your health care provider. ?Document Revised: 03/25/2021 Document Reviewed: 03/25/2021 ?Elsevier Patient Education ? [place].  ? ? ?Moderate Conscious Sedation, Adult, Care After ?This sheet gives you information about how to care for yourself after your procedure. Your health care provider may also give you more specific instructions. If you have problems or questions, contact your health care provider. ?What can I expect after the procedure? ?After the procedure, it is common to have: ?Sleepiness for several hours. ?Impaired judgment for several hours. ?Difficulty with balance. ?Vomiting if you eat too soon. ?Follow these instructions at home: ?For the time period you were told by your health care provider: ? ?  ? ?Rest. ?Do not participate in activities where you could fall or become injured. ?Do not drive or use machinery. ?Do not drink alcohol. ?Do not take sleeping pills or medicines that cause drowsiness. ?Do not make important decisions or sign legal documents. ?Do not take care of children on your own. ?Eating and drinking ? ?Follow the diet recommended by your health care provider. ?Drink enough fluid to keep your urine pale yellow. ?If you vomit: ?Drink water, juice, or soup when you can drink without vomiting. ?Make sure you have little or no nausea before eating solid foods. ?General instructions ?Take over-the-counter and prescription medicines only as told by your health care provider. ?Have a responsible adult stay with you for the time you are told. It is important to have someone help care for you until you are awake  and alert. ?Do not smoke. ?Keep all follow-up visits as told by your health care provider. This is important. ?Contact a health care provider if: ?You are still sleepy or having trouble with balance after 24 hours. ?You feel light-headed. ?You keep feeling nauseous or you keep vomiting. ?You develop a rash. ?You have a fever. ?You have redness or swelling around the IV site. ?Get help right away if: ?You have trouble breathing. ?You have new-onset confusion at home. ?Summary ?After the procedure, it is common to feel sleepy, have impaired judgment, or feel nauseous if you eat too soon. ?Rest after you get home. Know the things you should not do after the procedure. ?Follow the diet recommended by your health care provider and drink enough fluid to keep your urine pale yellow. ?Get help right away if you have trouble breathing or new-onset confusion at home. ?This information is not intended to replace advice given to you by your health care provider. Make sure you discuss any questions you have with your health care provider. ?Document Revised: 01/19/2020 Document Reviewed: 08/17/2019 ?Elsevier Patient Education ? [place].  ? ? ? ?

## 2022-02-18 ENCOUNTER — Ambulatory Visit
Admission: RE | Admit: 2022-02-18 | Discharge: 2022-02-18 | Disposition: A | Discharge status: Home / Self Care | Payer: PPO | Source: Ambulatory Visit | Attending: Radiation Oncology | Admitting: Radiation Oncology

## 2022-02-18 ENCOUNTER — Other Ambulatory Visit: Payer: Self-pay

## 2022-02-18 DIAGNOSIS — Z510 Encounter for antineoplastic radiation therapy: Secondary | ICD-10-CM | POA: Diagnosis not present

## 2022-02-18 DIAGNOSIS — C3411 Malignant neoplasm of upper lobe, right bronchus or lung: Secondary | ICD-10-CM | POA: Diagnosis not present

## 2022-02-18 DIAGNOSIS — F17210 Nicotine dependence, cigarettes, uncomplicated: Secondary | ICD-10-CM | POA: Diagnosis not present

## 2022-02-18 LAB — RAD ONC ARIA SESSION SUMMARY
Course Elapsed Days: 13
Plan Fractions Treated to Date: 9
Plan Prescribed Dose Per Fraction: 3 Gy
Plan Total Fractions Prescribed: 10
Plan Total Prescribed Dose: 30 Gy
Reference Point Dosage Given to Date: 29 Gy
Reference Point Session Dosage Given: 3 Gy
Session Number: 10

## 2022-02-19 ENCOUNTER — Encounter: Payer: Self-pay | Admitting: Urology

## 2022-02-19 ENCOUNTER — Ambulatory Visit
Admission: RE | Admit: 2022-02-19 | Discharge: 2022-02-19 | Disposition: A | Discharge status: Home / Self Care | Payer: PPO | Source: Ambulatory Visit | Attending: Radiation Oncology | Admitting: Radiation Oncology

## 2022-02-19 ENCOUNTER — Other Ambulatory Visit: Payer: Self-pay

## 2022-02-19 DIAGNOSIS — Z510 Encounter for antineoplastic radiation therapy: Secondary | ICD-10-CM | POA: Diagnosis not present

## 2022-02-19 DIAGNOSIS — C3411 Malignant neoplasm of upper lobe, right bronchus or lung: Secondary | ICD-10-CM

## 2022-02-19 LAB — RAD ONC ARIA SESSION SUMMARY
Course Elapsed Days: 14
Plan Fractions Treated to Date: 10
Plan Prescribed Dose Per Fraction: 3 Gy
Plan Total Fractions Prescribed: 10
Plan Total Prescribed Dose: 30 Gy
Reference Point Dosage Given to Date: 32 Gy
Reference Point Session Dosage Given: 3 Gy
Session Number: 11

## 2022-02-19 NOTE — Patient Instructions (Addendum)
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  You will be treated in the clinic every 3 weeks with a combination of chemotherapy and immunotherapy drugs.  Those drugs are carboplatin, etoposide, and atezolizumab (Tecentriq - immunotherapy).  You will come to the clinic for three days in a row every 3 weeks to receive your treatments.  On the first day, you will receive all three drugs.  On days 2 and 3, you will receive etoposide only.  We will repeat this process for 4 cycles of therapy, after which you will come to the clinic one day every 3 weeks to receive only the Tecentriq for maintenance.  The intent of treatment is to control this disease, prevent it from spreading further, and to alleviate any symptoms you may be having related to this cancer.  You will see the doctor regularly throughout treatment.  We will obtain blood work from you prior to every treatment and monitor your results to make sure it is safe to give your treatment. The doctor monitors your response to treatment by the way you are feeling, your blood work, and by obtaining scans periodically.  There will be wait times while you are here for treatment.  It will take about 30 minutes to 1 hour for your lab work to result.  Then there will be wait times while pharmacy mixes your medications.    Medications you will receive in the clinic prior to your chemotherapy medications:  Aloxi:  ALOXI is used in adults to help prevent nausea and vomiting that happens with certain chemotherapy drugs.  Aloxi is a long acting medication, and will remain in your system for about two days.   Emend:  This is an anti-nausea medication that is used with Aloxi to help prevent nausea and vomiting caused by chemotherapy.  Dexamethasone:  This is a steroid given prior to chemotherapy to help prevent allergic reactions; it may also help prevent and control nausea and diarrhea.      Atezolizumab Gildardo Pounds)  About This Drug Huey Bienenstock is used to treat cancer. It is given by the vein (IV). The first infusion is given over 1 hour. All subsequent infusions are given over 30 minutes.   Possible Side Effects  Nausea  Tiredness and weakness  Decreased appetite (decreased hunger)  Cough  Trouble breathing  Note: Each of the side effects above was reported in 20% or greater of patients treated with atezolizumab. Your side effects may be different if you are taking atezolizumab in combination with another agent. Not all possible side effects are included above.  Warnings and Precautions  This drug works with your immune system and can cause inflammation (swelling) in any of your organs and tissues and can change how they work. This may put you at risk for developing serious medical problems, which can be life-threatening.   Inflammation of the lungs which can be life-threatening. You may have a dry cough or trouble breathing.   Severe changes in your liver function which can cause liver failure and be life-threatening.   Colitis which is swelling in the colon. The symptoms are diarrhea, stomach cramping, and sometimes blood in the bowel movements.   Changes in your central nervous system can happen. The central nervous system is made up of your brain and spinal cord. You could feel extreme tiredness, agitation, confusion, hallucinations (see or hear things that are not there), trouble understanding or speaking, loss of control of  your bowels or bladder, eyesight changes, numbness or lack of strength to your arms, legs, face, or body, and coma. If you start to have any of these symptoms let your doctor know right away.   This drug may affect your hormone glands (thyroid, adrenals, pituitary and pancreas).   Blood sugar levels may change, and you may develop diabetes. If you already have diabetes, changes may need to be made to your diabetes medication.   Severe  infections, including viral, bacterial and fungal, which can be life-threatening   While you are getting this drug in your vein (IV), you may have a reaction to the drug. Sometimes you may be given medication to stop or lessen these side effects. Your nurse will check you closely for these signs: fever or shaking chills, flushing, facial swelling, feeling dizzy, headache, trouble breathing, rash, itching, chest tightness, or chest pain. These reactions may happen after your infusion. If this happens, call 911 for emergency care.  Note: Some of the side effects above are very rare. If you have concerns and/or questions, please discuss them with your medical team.  Important Information  This drug may be present in the saliva, tears, sweat, urine, stool, vomit, semen, and vaginal secretions. Talk to your doctor and/or your nurse about the necessary precautions to take during this time.   Treating Side Effects   Manage tiredness by pacing your activities for the day.   Be sure to include periods of rest between energy-draining activities.   To decrease the risk of infection, wash your hands regularly.   Avoid close contact with people who have a cold, the flu, or other infections.   Take your temperature as your doctor or nurse tells you, and whenever you feel like you may have a fever.   Drink plenty of fluids (a minimum of eight glasses per day is recommended).   Ask your doctor or nurse about medicine that is available to help stop or lessen loose bowel movements.   To help with nausea, eat small, frequent meals instead of three large meals a day. Choose foods and drinks that are at room temperature. Ask your nurse or doctor about other helpful tips and medicine that is available to help stop or lessen these symptoms.  To help with decreased appetite, eat small, frequent meals. Eat foods high in calories and protein, such as meat, poultry, fish, dry beans, tofu, eggs, nuts, milk, yogurt,  cheese, ice cream, pudding, and nutritional supplements.   Consider using sauces and spices to increase taste. Daily exercise, with your doctor's approval, may increase your appetite.   If you have diabetes, keep good control of your blood sugar level. Tell your nurse or your doctor if your glucose levels are higher or lower than normal.   Keeping your pain under control is important to your well-being. Please tell your doctor or nurse if you are experiencing pain.   If you get a rash do not put anything on it unless your doctor or nurse says you may. Keep the area around the rash clean and dry. Ask your doctor for medicine if your rash bothers you.   If you have numbness and tingling in your hands and feet, be careful when cooking, walking, and handling sharp objects and hot liquids.   Infusion reactions may happen after your infusion. If this happens, call 911 for emergency care.  Food and Drug Interactions  There are no known interactions of atezolizumab with food.   This drug may interact  with other medicines. Tell your doctor and pharmacist about all the prescription and over-the-counter medicines and dietary supplements (vitamins, minerals, herbs and others) that you are taking at this time. Also, check with your doctor or pharmacist before starting any new prescription or over-the-counter medicines, or dietary supplements to make sure that there are no interactions.  When to Call the Doctor  Call your doctor or nurse if you have any of these symptoms and/or any new or unusual symptoms:   Fever of 100.4 F (38 C) or higher   Chills   Tiredness that interferes with your daily activities   Feeling dizzy or lightheaded   Pain in your chest   Dry cough   Coughing up yellow, green, or bloody mucus   Wheezing or trouble breathing   Feeling that your heart is beating in a fast or not normal way (palpitations)   Confusion and/or agitation   Hallucinations   Trouble  understanding or speaking   Numbness or lack of strength to your arms, legs, face, or body   Blurred vision or other changes in eyesight   Diarrhea, 4 times in one day or diarrhea with lack of strength or a feeling of being dizzy   Pain in your abdomen that does not go away   Blood in your stool   Nausea that stops you from eating or drinking and/or is not relieved by prescribed medicines   Throwing up   Lasting loss of appetite or rapid weight loss of five pounds in a week   Abnormal blood sugar   Unusual thirst, passing urine often, headache, sweating, shakiness, irritability   Pain that does not go away, or is not relieved by prescribed medicines   Numbness, tingling, or pain your hands and feet   Extreme weakness that interferes with normal activities   A new rash or a rash that is not relieved by prescribed medicines   Signs of infusion reaction: fever or shaking chills, flushing, facial swelling, feeling dizzy, headache, trouble breathing, rash, itching, chest tightness, or chest pain. If this happens, call 911 for emergency care.   Signs of possible liver problems: dark urine, pale bowel movements, bad stomach pain, feeling very tired and weak, unusual itching, or yellowing of the eyes or skin   If you think you may be pregnant  Reproduction Warnings   Pregnancy warning: This drug can have harmful effects on the unborn baby. Women of childbearing potential should use effective methods of birth control during your cancer treatment and for at least 5 months after treatment. Let your doctor know right away if you think you may be pregnant.   Breastfeeding warning: It is not known if this drug passes into breast milk. For this reason, Women should not breastfeed during treatment and for at least 5 months after treatment because this drug could enter the breast milk and cause harm to a breastfeeding baby.   Fertility warning: In women, this drug may affect your ability to  have children in the future. Talk with your doctor or nurse if you plan to have children. Ask for information on egg banking.  Carboplatin (Paraplatin, CBDCA)  About This Drug  Carboplatin is used to treat cancer. It is given in the vein (IV).  This drug will take 30 minutes to infuse.   Possible Side Effects   Bone marrow suppression. This is a decrease in the number of white blood cells, red blood cells, and platelets. This may raise your risk of infection, make  you tired and weak (fatigue), and raise your risk of bleeding.   Nausea and vomiting (throwing up)   Weakness   Changes in your liver function   Changes in your kidney function   Electrolyte changes   Pain  Note: Each of the side effects above was reported in 20% or greater of patients treated with carboplatin. Not all possible side effects are included above.  Warnings and Precautions   Severe bone marrow suppression   Allergic reactions, including anaphylaxis are rare but may happen in some patients. Signs of allergic reaction to this drug may be swelling of the face, feeling like your tongue or throat are swelling, trouble breathing, rash, itching, fever, chills, feeling dizzy, and/or feeling that your heart is beating in a fast or not normal way. If this happens, do not take another dose of this drug. You should get urgent medical treatment.   Severe nausea and vomiting   Effects on the nerves are called peripheral neuropathy. This risk is increased if you are over the age of 54 or if you have received other medicine with risk of peripheral neuropathy. You may feel numbness, tingling, or pain in your hands and feet. It may be hard for you to button your clothes, open jars, or walk as usual. The effect on the nerves may get worse with more doses of the drug. These effects get better in some people after the drug is stopped but it does not get better in all people.   Blurred vision, loss of vision or other changes in  eyesight   Decreased hearing   - Skin and tissue irritation including redness, pain, warmth, or swelling at the IV site if the drug leaks out of the vein and into nearby tissue.   Severe changes in your kidney function, which can cause kidney failure   Severe changes in your liver function, which can cause liver failure  Note: Some of the side effects above are very rare. If you have concerns and/or questions, please discuss them with your medical team.  Important Information   This drug may be present in the saliva, tears, sweat, urine, stool, vomit, semen, and vaginal secretions. Talk to your doctor and/or your nurse about the necessary precautions to take during this time.  Treating Side Effects   Manage tiredness by pacing your activities for the day.   Be sure to include periods of rest between energy-draining activities.   To decrease the risk of infection, wash your hands regularly.   Avoid close contact with people who have a cold, the flu, or other infections.   Take your temperature as your doctor or nurse tells you, and whenever you feel like you may have a fever.   To help decrease the risk of bleeding, use a soft toothbrush. Check with your nurse before using dental floss.   Be very careful when using knives or tools.   Use an electric shaver instead of a razor.   Drink plenty of fluids (a minimum of eight glasses per day is recommended).   If you throw up or have loose bowel movements, you should drink more fluids so that you do not become dehydrated (lack of water in the body from losing too much fluid).   To help with nausea and vomiting, eat small, frequent meals instead of three large meals a day. Choose foods and drinks that are at room temperature. Ask your nurse or doctor about other helpful tips and medicine that is available to  help stop or lessen these symptoms.   If you have numbness and tingling in your hands and feet, be careful when cooking,  walking, and handling sharp objects and hot liquids.   Keeping your pain under control is important to your well-being. Please tell your doctor or nurse if you are experiencing pain.  Food and Drug Interactions  There are no known interactions of carboplatin with food.   This drug may interact with other medicines. Tell your doctor and pharmacist about all the prescription and over-the-counter medicines and dietary supplements (vitamins, minerals, herbs and others) that you are taking at this time. Also, check with your doctor or pharmacist before starting any new prescription or over-the-counter medicines, or dietary supplements to make sure that there are no interactions.  When to Call the Doctor  Call your doctor or nurse if you have any of these symptoms and/or any new or unusual symptoms:   Fever of 100.4 F (38 C) or higher   Chills   Tiredness that interferes with your daily activities   Feeling dizzy or lightheaded   Easy bleeding or bruising   Nausea that stops you from eating or drinking and/or is not relieved by prescribed medicines   Throwing up   Blurred vision or other changes in eyesight   Decrease in hearing or ringing in the ear   Signs of allergic reaction: swelling of the face, feeling like your tongue or throat are swelling, trouble breathing, rash, itching, fever, chills, feeling dizzy, and/or feeling that your heart is beating in a fast or not normal way. If this happens, call 911 for emergency care.   Signs of possible liver problems: dark urine, pale bowel movements, bad stomach pain, feeling very tired and weak, unusual itching, or yellowing of the eyes or skin   Decreased urine, or very dark urine   Numbness, tingling, or pain in your hands and feet   Pain that does not go away or is not relieved by prescribed medicine   While you are getting this drug, please tell your nurse right away if you have any pain, redness, or swelling at the site of the  IV infusion, or if you have any new onset of symptoms, or if you just feel "different" from before when the infusion was started.   Reproduction Warnings   Pregnancy warning: This drug may have harmful effects on the unborn baby. Women of child bearing potential should use effective methods of birth control during your cancer treatment. Let your doctor know right away if you think you may be pregnant.   Breastfeeding warning: It is not known if this drug passes into breast milk. For this reason, women should not breastfeed during treatment because this drug could enter the breast milk and cause harm to a breastfeeding baby.   Fertility warning: Human fertility studies have not been done with this drug. Talk with your doctor or nurse if you plan to have children. Ask for information on sperm or egg banking.  Etoposide  About This Drug  Etoposide is used to treat cancer. It is given in the vein (IV).  This drug will take 1 hour to infuse.    Possible Side Effects   Bone marrow suppression. This is a decrease in the number of white blood cells, red blood cells, and platelets. This may raise your risk of infection, make you tired and weak (fatigue), and raise your risk of bleeding.   Nausea and vomiting (throwing up)   Hair loss.  Hair loss is often temporary, although with certain medicine, hair loss can sometimes be permanent. Hair loss may happen suddenly or gradually. If you lose hair, you may lose it from your head, face, armpits, pubic area, chest, and/or legs. You may also notice your hair getting thin.  Note: Each of the side effects above was reported in 20% or greater of patients treated with etoposide. Not all possible side effects are included above.  Warnings and Precautions   Severe bone marrow suppression, which may be life-threatening   This drug may raise your risk of getting a second cancer, such as leukemia   Low blood pressure with rapid infusion of the medication    Allergic reactions, including anaphylaxis are rare but may happen in some patients. Signs of allergic reaction to this drug may be swelling of the face, feeling like your tongue or throat are swelling, trouble breathing, rash, itching, fever, chills, feeling dizzy, and/or feeling that your heart is beating in a fast or not normal way. If this happens, do not take another dose of this drug. You should get urgent medical treatment.   These side effects may be more severe if you are receiving high doses of this medication included in pre-transplant chemotherapy.  Treating Side Effects   Manage tiredness by pacing your activities for the day.   Be sure to include periods of rest between energy-draining activities.   To decrease the risk of infection, wash your hands regularly.   Avoid close contact with people who have a cold, the flu, or other infections.   Take your temperature as your doctor or nurse tells you, and whenever you feel like you may have a fever.   To help decrease bleeding, use a soft toothbrush. Check with your nurse before using dental floss.   Be very careful when using knives or tools.   Use an electric shaver instead of a razor.   Drink plenty of fluids (a minimum of eight glasses per day is recommended).   If you throw up or have loose bowel movements, you should drink more fluids so that you do not become dehydrated (lack of water in the body from losing too much fluid).   To help with nausea and vomiting, eat small, frequent meals instead of three large meals a day. Choose foods and drinks that are at room temperature. Ask your nurse or doctor about other helpful tips and medicine that is available to help or stop lessen these symptoms.   To help with hair loss, wash with a mild shampoo and avoid washing your hair every day.   Avoid rubbing your scalp, pat your hair or scalp dry.   Avoid coloring your hair.   Limit your use of hair spray, electric curlers,  blow dryers, and curling irons.   If you are interested in getting a wig, talk to your nurse. You can also call the [place] at [phone] to find out information about the "Look Good, Feel Better" program close to where you live. It is a free program where women getting chemotherapy can learn about wigs, turbans and scarves as well as makeup techniques and skin and nail care.  Food and Drug Interactions   There are no known interactions of etoposide with food.   This drug may interact with other medicines. Tell your doctor and pharmacist about all the medicines and dietary supplements (vitamins, minerals, herbs and others) that you are taking at this time. The safety and use of dietary  supplements and alternative diets are often not known. Using these might affect your cancer or interfere with your treatment. Until more is known, you should not use dietary supplements or alternative diets without your cancer doctor's help.   There are known interactions of etoposide with blood thinning medicine such as warfarin. Ask your doctor what precautions you should take.  When to Call the Doctor  Call your doctor or nurse if you have any of these symptoms and/or any new or unusual symptoms:   Fever of 100.4 F (38 C) or higher   Chills   Tiredness that interferes with your daily activities   Feeling dizzy or lightheaded   Feeling that your heart is beating in a fast or not normal way (palpitations)   Easy bleeding or bruising   Nausea that stops you from eating or drinking and/or is not relieved by prescribed medicines   Throwing up   Signs of allergic reaction: swelling of the face, feeling like your tongue or throat are swelling, trouble breathing, rash, itching, fever, chills, feeling dizzy, and/or feeling that your heart is beating in a fast or not normal way   If you think you may be pregnant or may have impregnated your partner  Reproduction Warnings   Pregnancy  warning: This drug can have harmful effects on the unborn baby. Women of childbearing potential should use effective methods of birth control during your cancer treatment and for at least 6 months after treatment. Men with male partners of childbearing potential should use effective methods of birth control during your cancer treatment and for at least 4 months after your cancer treatment. Let your doctor know right away if you think you may be pregnant or may have impregnated your partner.   Breastfeeding warning: It is not known if this drug passes into breast milk. For this reason, women should talk to their doctor about the risks and benefits of breastfeeding during treatment with this drug because this drug may enter the breast milk and cause harm to a breastfeeding baby.   Fertility warning: In men and women both, this drug may affect your ability to have children in the future. Talk with your doctor or nurse if you plan to have children. Ask for information on sperm or egg banking.   SELF CARE ACTIVITIES WHILE RECEIVING CHEMOTHERAPY:  Hydration Increase your fluid intake and drink at least 8 to 12 cups (64 ounces) of water/decaffeinated beverages per day after treatment. You can still have your cup of coffee or soda but these beverages do not count as part of your 8 to 12 cups that you need to drink daily. No alcohol intake.  Medications Continue taking your normal prescription medication as prescribed.  If you start any new herbal or new supplements please let us know first to make sure it is safe.  Mouth Care Have teeth cleaned professionally before starting treatment. Keep dentures and partial plates clean. Use soft toothbrush and do not use mouthwashes that contain alcohol. Biotene is a good mouthwash that is available at most pharmacies or may be ordered by calling [phone]. Use warm salt water gargles (1 teaspoon salt per 1 quart warm water) before and after meals and at bedtime.  If you need dental work, please let the doctor know before you go for your appointment so that we can coordinate the best possible time for you in regards to your chemo regimen. You need to also let your dentist know that you are actively taking chemo.  We may need to do labs prior to your dental appointment.  Skin Care Always use sunscreen that has not expired and with SPF (Sun Protection Factor) of 50 or higher. Wear hats to protect your head from the sun. Remember to use sunscreen on your hands, ears, face, & feet.  Use good moisturizing lotions such as udder cream, eucerin, or even Vaseline. Some chemotherapies can cause dry skin, color changes in your skin and nails.    Avoid long, hot showers or baths. Use gentle, fragrance-free soaps and laundry detergent. Use moisturizers, preferably creams or ointments rather than lotions because the thicker consistency is better at preventing skin dehydration. Apply the cream or ointment within 15 minutes of showering. Reapply moisturizer at night, and moisturize your hands every time after you wash them.  Hair Loss (if your doctor says your hair will fall out)  If your doctor says that your hair is likely to fall out, decide before you begin chemo whether you want to wear a wig. You may want to shop before treatment to match your hair color. Hats, turbans, and scarves can also camouflage hair loss, although some people prefer to leave their heads uncovered. If you go bare-headed outdoors, be sure to use sunscreen on your scalp. Cut your hair short. It eases the inconvenience of shedding lots of hair, but it also can reduce the emotional impact of watching your hair fall out. Don't perm or color your hair during chemotherapy. Those chemical treatments are already damaging to hair and can enhance hair loss. Once your chemo treatments are done and your hair has grown back, it's OK to resume dyeing or perming hair.  With chemotherapy, hair loss is almost always  temporary. But when it grows back, it may be a different color or texture. In older adults who still had hair color before chemotherapy, the new growth may be completely gray.  Often, new hair is very fine and soft.  Infection Prevention Please wash your hands for at least 30 seconds using warm soapy water. Handwashing is the #1 way to prevent the spread of germs. Stay away from sick people or people who are getting over a cold. If you develop respiratory systems such as green/yellow mucus production or productive cough or persistent cough let us know and we will see if you need an antibiotic. It is a good idea to keep a pair of gloves on when going into grocery stores/[employer] to decrease your risk of coming into contact with germs on the carts, etc. Carry alcohol hand gel with you at all times and use it frequently if out in public. If your temperature reaches 100.5 or higher please call the clinic and let us know.  If it is after hours or on the weekend please go to the ER if your temperature is over 100.5.  Please have your own personal thermometer at home to use.    Sex and bodily fluids If you are going to have sex, a condom must be used to protect the person that isn't taking chemotherapy. Chemo can decrease your libido (sex drive). For a few days after chemotherapy, chemotherapy can be excreted through your bodily fluids.  When using the toilet please close the lid and flush the toilet twice.  Do this for a few day after you have had chemotherapy.   Effects of chemotherapy on your sex life Some changes are simple and won't last long. They won't affect your sex life permanently.  Sometimes you may feel: too  tired not strong enough to be very active sick or sore  not in the mood anxious or low Your anxiety might not seem related to sex. For example, you may be worried about the cancer and how your treatment is going. Or you may be worried about money, or about how you family are coping with  your illness.  These things can cause stress, which can affect your interest in sex. It's important to talk to your partner about how you feel.  Remember - the changes to your sex life don't usually last long. There's usually no medical reason to stop having sex during chemo. The drugs won't have any long term physical effects on your performance or enjoyment of sex. Cancer can't be passed on to your partner during sex  Contraception It's important to use reliable contraception during treatment. Avoid getting pregnant while you or your partner are having chemotherapy. This is because the drugs may harm the baby. Sometimes chemotherapy drugs can leave a man or woman infertile.  This means you would not be able to have children in the future. You might want to talk to someone about permanent infertility. It can be very difficult to learn that you may no longer be able to have children. Some people find counselling helpful. There might be ways to preserve your fertility, although this is easier for men than for women. You may want to speak to a fertility expert. You can talk about sperm banking or harvesting your eggs. You can also ask about other fertility options, such as donor eggs. If you have or have had breast cancer, your doctor might advise you not to take the contraceptive pill. This is because the hormones in it might affect the cancer. It is not known for sure whether or not chemotherapy drugs can be passed on through semen or secretions from the vagina. Because of this some doctors advise people to use a barrier method if you have sex during treatment. This applies to vaginal, anal or oral sex. Generally, doctors advise a barrier method only for the time you are actually having the treatment and for about a week after your treatment. Advice like this can be worrying, but this does not mean that you have to avoid being intimate with your partner. You can still have close contact with your partner and  continue to enjoy sex.  Animals If you have cats or birds we just ask that you not change the litter or change the cage.  Please have someone else do this for you while you are on chemotherapy.   Food Safety During and After Cancer Treatment Food safety is important for people both during and after cancer treatment. Cancer and cancer treatments, such as chemotherapy, radiation therapy, and stem cell/bone marrow transplantation, often weaken the immune system. This makes it harder for your body to protect itself from foodborne illness, also called food poisoning. Foodborne illness is caused by eating food that contains harmful bacteria, parasites, or viruses.  Foods to avoid Some foods have a higher risk of becoming tainted with bacteria. These include: Unwashed fresh fruit and vegetables, especially leafy vegetables that can hide dirt and other contaminants Raw sprouts, such as alfalfa sprouts Raw or undercooked beef, especially ground beef, or other raw or undercooked meat and poultry Fatty, fried, or spicy foods immediately before or after treatment.  These can sit heavy on your stomach and make you feel nauseous. Raw or undercooked shellfish, such as oysters. Sushi and sashimi, which often  contain raw fish.  Unpasteurized beverages, such as unpasteurized fruit juices, raw milk, raw yogurt, or cider Undercooked eggs, such as soft boiled, over easy, and poached; raw, unpasteurized eggs; or foods made with raw egg, such as homemade raw cookie dough and homemade mayonnaise  Simple steps for food safety  Shop smart. Do not buy food stored or displayed in an unclean area. Do not buy bruised or damaged fruits or vegetables. Do not buy cans that have cracks, dents, or bulges. Pick up foods that can spoil at the end of your shopping trip and store them in a cooler on the way home.  Prepare and clean up foods carefully. Rinse all fresh fruits and vegetables under running water, and dry them with  a clean towel or paper towel. Clean the top of cans before opening them. After preparing food, wash your hands for 20 seconds with hot water and soap. Pay special attention to areas between fingers and under nails. Clean your utensils and dishes with hot water and soap. Disinfect your kitchen and cutting boards using 1 teaspoon of liquid, unscented bleach mixed into 1 quart of water.    Dispose of old food. Eat canned and packaged food before its expiration date (the "use by" or "best before" date). Consume refrigerated leftovers within 3 to 4 days. After that time, throw out the food. Even if the food does not smell or look spoiled, it still may be unsafe. Some bacteria, such as Listeria, can grow even on foods stored in the refrigerator if they are kept for too long.  Take precautions when eating out. At restaurants, avoid buffets and salad bars where food sits out for a long time and comes in contact with many people. Food can become contaminated when someone with a virus, often a norovirus, or another "bug" handles it. Put any leftover food in a "to-go" container yourself, rather than having the server do it. And, refrigerate leftovers as soon as you get home. Choose restaurants that are clean and that are willing to prepare your food as you order it cooked.   [name] MEDICATIONS:                                                                                                                                                                Compazine/Prochlorperazine 10mg  tablet. Take 1 tablet every 6 hours as needed for nausea/vomiting. (This can make you sleepy)   EMLA cream. Apply a quarter size amount to port site 1 hour prior to chemo. Do not rub in. Cover with plastic wrap.    Diarrhea Sheet   If you are having loose stools/diarrhea, please purchase Imodium and begin taking as outlined:  At the first sign of poorly formed or loose stools you should begin taking  Imodium (loperamide)  2 mg capsules.  Take two tablets (4mg ) followed by one tablet (2mg ) every 2 hours - DO NOT EXCEED 8 tablets in 24 hours.  If it is bedtime and you are having loose stools, take 2 tablets at bedtime, then 2 tablets every 4 hours until morning.   Always call the [facility] if you are having loose stools/diarrhea that you can't get under control.  Loose stools/diarrhea leads to dehydration (loss of water) in your body.  We have other options of trying to get the loose stools/diarrhea to stop but you must let us know!   Constipation Sheet  Colace - 100 mg capsules - take 2 capsules daily.  If this doesn't help then you can increase to 2 capsules twice daily.  Please call if the above does not work for you. Do not go more than 2 days without a bowel movement.  It is very important that you do not become constipated.  It will make you feel sick to your stomach (nausea) and can cause abdominal pain and vomiting.  Nausea Sheet   Compazine/Prochlorperazine 10mg  tablet. Take 1 tablet every 6 hours as needed for nausea/vomiting (This can make you drowsy).  If you are having persistent nausea (nausea that does not stop) please call the [facility] and let us know the amount of nausea that you are experiencing.  If you begin to vomit, you need to call the [facility] and if it is the weekend and you have vomited more than one time and can't get it to stop-go to the Emergency Room.  Persistent nausea/vomiting can lead to dehydration (loss of fluid in your body) and will make you feel very weak and unwell. Ice chips, sips of clear liquids, foods that are at room temperature, crackers, and toast tend to be better tolerated.   SYMPTOMS TO REPORT AS SOON AS POSSIBLE AFTER TREATMENT:  FEVER GREATER THAN 100.4 F  CHILLS WITH OR WITHOUT FEVER  NAUSEA AND VOMITING THAT IS NOT CONTROLLED WITH YOUR NAUSEA MEDICATION  UNUSUAL SHORTNESS OF BREATH  UNUSUAL BRUISING OR BLEEDING  TENDERNESS IN MOUTH AND THROAT  WITH OR WITHOUT   PRESENCE OF ULCERS  URINARY PROBLEMS  BOWEL PROBLEMS  UNUSUAL RASH      Wear comfortable clothing and clothing appropriate for easy access to any Portacath or PICC line. Let us know if there is anything that we can do to make your therapy better!    What to do if you need assistance after hours or on the weekends: CALL [phone].  HOLD on the line, do not hang up.  You will hear multiple messages but at the end you will be connected with a nurse triage line.  They will contact the doctor if necessary.  Most of the time they will be able to assist you.  Do not call the hospital operator.      I have been informed and understand all of the instructions given to me and have received a copy. I have been instructed to call the clinic [phone] or my family physician as soon as possible for continued medical care, if indicated. I do not have any more questions at this time but understand that I may call the [facility] or the Patient Navigator at [phone] during office hours should I have questions or need assistance in obtaining follow-up care.

## 2022-02-20 ENCOUNTER — Ambulatory Visit: Payer: PPO

## 2022-02-23 ENCOUNTER — Ambulatory Visit: Payer: PPO

## 2022-02-23 ENCOUNTER — Inpatient Hospital Stay (HOSPITAL_COMMUNITY): Payer: PPO

## 2022-02-23 ENCOUNTER — Encounter (HOSPITAL_COMMUNITY): Payer: Self-pay

## 2022-02-23 VITALS — BP 103/66 | HR 80 | Temp 97.0°F | Resp 20

## 2022-02-23 DIAGNOSIS — C3411 Malignant neoplasm of upper lobe, right bronchus or lung: Secondary | ICD-10-CM

## 2022-02-23 DIAGNOSIS — Z5189 Encounter for other specified aftercare: Secondary | ICD-10-CM | POA: Insufficient documentation

## 2022-02-23 DIAGNOSIS — Z79899 Other long term (current) drug therapy: Secondary | ICD-10-CM | POA: Diagnosis not present

## 2022-02-23 DIAGNOSIS — Z5112 Encounter for antineoplastic immunotherapy: Secondary | ICD-10-CM | POA: Diagnosis not present

## 2022-02-23 DIAGNOSIS — C3481 Malignant neoplasm of overlapping sites of right bronchus and lung: Secondary | ICD-10-CM | POA: Insufficient documentation

## 2022-02-23 DIAGNOSIS — Z5111 Encounter for antineoplastic chemotherapy: Secondary | ICD-10-CM | POA: Insufficient documentation

## 2022-02-23 DIAGNOSIS — E876 Hypokalemia: Secondary | ICD-10-CM

## 2022-02-23 LAB — CBC WITH DIFFERENTIAL/PLATELET
Abs Immature Granulocytes: 0.07 10*3/uL (ref 0.00–0.07)
Basophils Absolute: 0 10*3/uL (ref 0.0–0.1)
Basophils Relative: 0 %
Eosinophils Absolute: 0.2 10*3/uL (ref 0.0–0.5)
Eosinophils Relative: 3 %
HCT: 46.2 % (ref 39.0–52.0)
Hemoglobin: 15.5 g/dL (ref 13.0–17.0)
Immature Granulocytes: 1 %
Lymphocytes Relative: 8 %
Lymphs Abs: 0.6 10*3/uL — ABNORMAL LOW (ref 0.7–4.0)
MCH: 30.3 pg (ref 26.0–34.0)
MCHC: 33.5 g/dL (ref 30.0–36.0)
MCV: 90.2 fL (ref 80.0–100.0)
Monocytes Absolute: 0.4 10*3/uL (ref 0.1–1.0)
Monocytes Relative: 6 %
Neutro Abs: 6.2 10*3/uL (ref 1.7–7.7)
Neutrophils Relative %: 82 %
Platelets: 201 10*3/uL (ref 150–400)
RBC: 5.12 MIL/uL (ref 4.22–5.81)
RDW: 13.4 % (ref 11.5–15.5)
WBC: 7.6 10*3/uL (ref 4.0–10.5)
nRBC: 0 % (ref 0.0–0.2)

## 2022-02-23 LAB — COMPREHENSIVE METABOLIC PANEL
ALT: 10 U/L (ref 0–44)
AST: 15 U/L (ref 15–41)
Albumin: 3.5 g/dL (ref 3.5–5.0)
Alkaline Phosphatase: 71 U/L (ref 38–126)
Anion gap: 8 (ref 5–15)
BUN: 13 mg/dL (ref 8–23)
CO2: 31 mmol/L (ref 22–32)
Calcium: 8.1 mg/dL — ABNORMAL LOW (ref 8.9–10.3)
Chloride: 97 mmol/L — ABNORMAL LOW (ref 98–111)
Creatinine, Ser: 0.91 mg/dL (ref 0.61–1.24)
GFR, Estimated: >60 mL/min (ref >60)
Glucose, Bld: 176 mg/dL — ABNORMAL HIGH (ref 70–99)
Potassium: 2.4 mmol/L — CL (ref 3.5–5.1)
Sodium: 136 mmol/L (ref 135–145)
Total Bilirubin: 0.9 mg/dL (ref 0.3–1.2)
Total Protein: 6.6 g/dL (ref 6.5–8.1)

## 2022-02-23 LAB — TSH: TSH: 0.78 u[IU]/mL (ref 0.350–4.500)

## 2022-02-23 LAB — MAGNESIUM: Magnesium: 1.7 mg/dL (ref 1.7–2.4)

## 2022-02-23 MED ORDER — SODIUM CHLORIDE 0.9 % IV SOLN
10.0000 mg | Freq: Once | INTRAVENOUS | Status: AC
  Administered 2022-02-23: 10 mg via INTRAVENOUS
  Filled 2022-02-23: qty 10

## 2022-02-23 MED ORDER — SODIUM CHLORIDE 0.9 % IV SOLN: Freq: Once | INTRAVENOUS | Status: AC

## 2022-02-23 MED ORDER — POTASSIUM CHLORIDE CRYS ER 20 MEQ PO TBCR
40.0000 meq | EXTENDED_RELEASE_TABLET | Freq: Once | ORAL | Status: AC
  Administered 2022-02-23: 40 meq via ORAL
  Filled 2022-02-23: qty 2

## 2022-02-23 MED ORDER — POTASSIUM CHLORIDE 10 MEQ/100ML IV SOLN
10.0000 meq | INTRAVENOUS | Status: AC
  Administered 2022-02-23 (×2): 10 meq via INTRAVENOUS
  Filled 2022-02-23 (×2): qty 100

## 2022-02-23 MED ORDER — HEPARIN SOD (PORK) LOCK FLUSH 100 UNIT/ML IV SOLN
500.0000 [IU] | Freq: Once | INTRAVENOUS | Status: AC | PRN
  Administered 2022-02-23: 500 [IU]

## 2022-02-23 MED ORDER — SODIUM CHLORIDE 0.9 % IV SOLN
1200.0000 mg | Freq: Once | INTRAVENOUS | Status: AC
  Administered 2022-02-23: 1200 mg via INTRAVENOUS
  Filled 2022-02-23: qty 20

## 2022-02-23 MED ORDER — LIDOCAINE-PRILOCAINE 2.5-2.5 % EX CREA: TOPICAL_CREAM | CUTANEOUS | 3 refills | Status: DC

## 2022-02-23 MED ORDER — SODIUM CHLORIDE 0.9 % IV SOLN
636.0000 mg | Freq: Once | INTRAVENOUS | Status: AC
  Administered 2022-02-23: 640 mg via INTRAVENOUS
  Filled 2022-02-23: qty 64

## 2022-02-23 MED ORDER — SODIUM CHLORIDE 0.9% FLUSH
10.0000 mL | INTRAVENOUS | Status: DC | PRN
  Administered 2022-02-23: 10 mL

## 2022-02-23 MED ORDER — PROCHLORPERAZINE MALEATE 10 MG PO TABS: 10.0000 mg | ORAL_TABLET | Freq: Four times a day (QID) | ORAL | 3 refills | Status: DC | PRN

## 2022-02-23 MED ORDER — PALONOSETRON HCL INJECTION 0.25 MG/5ML
0.2500 mg | Freq: Once | INTRAVENOUS | Status: AC
  Administered 2022-02-23: 0.25 mg via INTRAVENOUS
  Filled 2022-02-23: qty 5

## 2022-02-23 MED ORDER — SODIUM CHLORIDE 0.9 % IV SOLN
100.0000 mg/m2 | Freq: Once | INTRAVENOUS | Status: AC
  Administered 2022-02-23: 230 mg via INTRAVENOUS
  Filled 2022-02-23: qty 11.5

## 2022-02-23 MED ORDER — ONDANSETRON HCL 8 MG PO TABS: 8.0000 mg | ORAL_TABLET | Freq: Three times a day (TID) | ORAL | 1 refills | Status: DC | PRN

## 2022-02-23 MED ORDER — SODIUM CHLORIDE 0.9 % IV SOLN
150.0000 mg | Freq: Once | INTRAVENOUS | Status: AC
  Administered 2022-02-23: 150 mg via INTRAVENOUS
  Filled 2022-02-23: qty 150

## 2022-02-23 NOTE — Patient Instructions (Signed)
[facility]  Discharge Instructions: Thank you for choosing [facility] to provide your oncology and hematology care.  If you have a lab appointment with the [facility], please come in thru the Main Entrance and check in at the main information desk.  Wear comfortable clothing and clothing appropriate for easy access to any Portacath or PICC line.   We strive to give you quality time with your provider. You may need to reschedule your appointment if you arrive late (15 or more minutes).  Arriving late affects you and other patients whose appointments are after yours.  Also, if you miss three or more appointments without notifying the office, you may be dismissed from the clinic at the provider's discretion.      For prescription refill requests, have your pharmacy contact our office and allow 72 hours for refills to be completed.    Today you received the following chemotherapy and/or immunotherapy agents Tecentriq, Carboplatin, and Etoposide, return as scheduled.   To help prevent nausea and vomiting after your treatment, we encourage you to take your nausea medication as directed.  BELOW ARE SYMPTOMS THAT SHOULD BE REPORTED IMMEDIATELY: *FEVER GREATER THAN 100.4 F (38 C) OR HIGHER *CHILLS OR SWEATING *NAUSEA AND VOMITING THAT IS NOT CONTROLLED WITH YOUR NAUSEA MEDICATION *UNUSUAL SHORTNESS OF BREATH *UNUSUAL BRUISING OR BLEEDING *URINARY PROBLEMS (pain or burning when urinating, or frequent urination) *BOWEL PROBLEMS (unusual diarrhea, constipation, pain near the anus) TENDERNESS IN MOUTH AND THROAT WITH OR WITHOUT PRESENCE OF ULCERS (sore throat, sores in mouth, or a toothache) UNUSUAL RASH, SWELLING OR PAIN  UNUSUAL VAGINAL DISCHARGE OR ITCHING   Items with * indicate a potential emergency and should be followed up as soon as possible or go to the Emergency Department if any problems should occur.  Please show the CHEMOTHERAPY ALERT CARD or IMMUNOTHERAPY  ALERT CARD at check-in to the Emergency Department and triage nurse.  Should you have questions after your visit or need to cancel or reschedule your appointment, please contact [facility] [phone]  and follow the prompts.  Office hours are 8:00 a.m. to 4:30 p.m. Monday - Friday. Please note that voicemails left after 4:00 p.m. may not be returned until the following business day.  We are closed weekends and major holidays. You have access to a nurse at all times for urgent questions. Please call the main number to the clinic [phone] and follow the prompts.  For any non-urgent questions, you may also contact your provider using MyChart. We now offer e-Visits for anyone 51 and older to request care online for non-urgent symptoms. For details visit [email or web address].   Also download the MyChart app! Go to the app store, search "MyChart", open the app, select [facility], and log in with your MyChart username and password.  Due to Covid, a mask is required upon entering the hospital/clinic. If you do not have a mask, one will be given to you upon arrival. For doctor visits, patients may have 1 support person aged 37 or older with them. For treatment visits, patients cannot have anyone with them due to current Covid guidelines and our immunocompromised population.

## 2022-02-23 NOTE — Progress Notes (Signed)
Patient presents today for treatment, patient's labs within treatment parameters, patient's potassium 2.4, 40 mEq PO, 20 mEq IV and 40 mEq PO potassium ordered per standing orders, Dr. Delton Coombes made aware. Patient tolerated chemotherapy with no complaints voiced. Side effects with management reviewed understanding verbalized. Port site clean and dry with no bruising or swelling noted at site. Good blood return noted before and after administration of chemotherapy. Patient left in satisfactory condition with VSS and no s/s of distress noted.

## 2022-02-23 NOTE — Progress Notes (Signed)
Pharmacist Chemotherapy Monitoring - Initial Assessment    Anticipated start date: 02/23/22   The following has been reviewed per standard work regarding the patient's treatment regimen: The patient's diagnosis, treatment plan and drug doses, and organ/hematologic function Lab orders and baseline tests specific to treatment regimen  The treatment plan start date, drug sequencing, and pre-medications Prior authorization status  Patient's documented medication list, including drug-drug interaction screen and prescriptions for anti-emetics and supportive care specific to the treatment regimen The drug concentrations, fluid compatibility, administration routes, and timing of the medications to be used The patient's access for treatment and lifetime cumulative dose history, if applicable  The patient's medication allergies and previous infusion related reactions, if applicable   Changes made to treatment plan:  N/A  Follow up needed:  N/A   Larry Moore, [facility], 02/23/2022  10:26 AM

## 2022-02-23 NOTE — Progress Notes (Signed)
CRITICAL VALUE ALERT Critical value received:  Potassium 2.4 Date of notification:  02/23/2022 Time of notification: 10:10 am.  Critical value read back:  Yes.   Nurse who received alert:  Jene Every Rn/ B. Maverick Dieudonne RN MD notified time and response:  Katragadda.

## 2022-02-23 NOTE — Progress Notes (Signed)
Chemotherapy and immunotherapy education packet given and discussed with pt and family in detail. Discussed diagnosis and staging, tx regimen, and intent of tx. Reviewed chemotherapy and immunotherapy medications and side effects, as well as pre-medications. Instructed on how to manage side effects at home, and when to call the clinic. Importance of fever/chills discussed with pt and family. Discussed precautions to implement at home after receiving tx, as well as self care strategies. Phone numbers provided for clinic during regular working hours, also how to reach the clinic after hours and on weekends. Pt and family provided the opportunity to ask questions - all questions answered to pt's and family satisfaction.

## 2022-02-24 ENCOUNTER — Inpatient Hospital Stay (HOSPITAL_COMMUNITY): Payer: PPO

## 2022-02-24 ENCOUNTER — Ambulatory Visit: Payer: PPO

## 2022-02-24 ENCOUNTER — Encounter (HOSPITAL_COMMUNITY): Payer: Self-pay

## 2022-02-24 VITALS — BP 92/58 | HR 68 | Temp 97.9°F | Resp 20

## 2022-02-24 DIAGNOSIS — Z5111 Encounter for antineoplastic chemotherapy: Secondary | ICD-10-CM | POA: Diagnosis not present

## 2022-02-24 DIAGNOSIS — C3411 Malignant neoplasm of upper lobe, right bronchus or lung: Secondary | ICD-10-CM

## 2022-02-24 LAB — T4: T4, Total: 6.7 ug/dL (ref 4.5–12.0)

## 2022-02-24 MED ORDER — HEPARIN SOD (PORK) LOCK FLUSH 100 UNIT/ML IV SOLN
500.0000 [IU] | Freq: Once | INTRAVENOUS | Status: AC | PRN
  Administered 2022-02-24: 500 [IU]

## 2022-02-24 MED ORDER — SODIUM CHLORIDE 0.9 % IV SOLN
100.0000 mg/m2 | Freq: Once | INTRAVENOUS | Status: AC
  Administered 2022-02-24: 230 mg via INTRAVENOUS
  Filled 2022-02-24: qty 11.5

## 2022-02-24 MED ORDER — SODIUM CHLORIDE 0.9 % IV SOLN: Freq: Once | INTRAVENOUS | Status: AC

## 2022-02-24 MED ORDER — SODIUM CHLORIDE 0.9% FLUSH
10.0000 mL | INTRAVENOUS | Status: DC | PRN
  Administered 2022-02-24: 10 mL

## 2022-02-24 MED ORDER — SODIUM CHLORIDE 0.9 % IV SOLN
10.0000 mg | Freq: Once | INTRAVENOUS | Status: AC
  Administered 2022-02-24: 10 mg via INTRAVENOUS
  Filled 2022-02-24: qty 1

## 2022-02-24 NOTE — Progress Notes (Signed)
Patient reports that he is doing "fine".  Not having any issues, nothing to report.  He came in and his blood pressure was elevated.  We let him rest for about 20 minutes and his pressure has dropped down to 92/58.  We will proceed with his treatment today.

## 2022-02-24 NOTE — Progress Notes (Signed)
Patient presents today for treatment. No complaints of any side effects related to treatment. Patient requests appointment the week of 03/04/22 with Dr. Delton Coombes to be canceled due to a family beach trip. Informed Dr. Delton Coombes that patient will not be here for his 1 week follow up due to a family beach trip. Verbal instructions received to cancel his appointment for labs and 1 week f/u. Scheduling notified. Patient instructed to call the clinic if he's not feeling well prior to cycle 2.   Treatment given today per MD orders. Tolerated infusion without adverse affects. Vital signs stable. No complaints at this time. Discharged from clinic ambulatory in stable condition. Alert and oriented x 3. F/U with [facility] as scheduled.

## 2022-02-24 NOTE — Patient Instructions (Signed)
Larry [facility]  Discharge Instructions: Thank you for choosing [facility] to provide your oncology and hematology care.  If you have a lab appointment with the [facility], please come in thru the Main Entrance and check in at the main information desk.  Wear comfortable clothing and clothing appropriate for easy access to any Portacath or PICC line.   We strive to give you quality time with your provider. You may need to reschedule your appointment if you arrive late (15 or more minutes).  Arriving late affects you and other patients whose appointments are after yours.  Also, if you miss three or more appointments without notifying the office, you may be dismissed from the clinic at the provider's discretion.      For prescription refill requests, have your pharmacy contact our office and allow 72 hours for refills to be completed.    Today you received the following chemotherapy and/or immunotherapy agents Etoposide.      To help prevent nausea and vomiting after your treatment, we encourage you to take your nausea medication as directed.  BELOW ARE SYMPTOMS THAT SHOULD BE REPORTED IMMEDIATELY: *FEVER GREATER THAN 100.4 F (38 C) OR HIGHER *CHILLS OR SWEATING *NAUSEA AND VOMITING THAT IS NOT CONTROLLED WITH YOUR NAUSEA MEDICATION *UNUSUAL SHORTNESS OF BREATH *UNUSUAL BRUISING OR BLEEDING *URINARY PROBLEMS (pain or burning when urinating, or frequent urination) *BOWEL PROBLEMS (unusual diarrhea, constipation, pain near the anus) TENDERNESS IN MOUTH AND THROAT WITH OR WITHOUT PRESENCE OF ULCERS (sore throat, sores in mouth, or a toothache) UNUSUAL RASH, SWELLING OR PAIN  UNUSUAL VAGINAL DISCHARGE OR ITCHING   Items with * indicate a potential emergency and should be followed up as soon as possible or go to the Emergency Department if any problems should occur.  Please show the CHEMOTHERAPY ALERT CARD or IMMUNOTHERAPY ALERT CARD at check-in to the Emergency  Department and triage nurse.  Should you have questions after your visit or need to cancel or reschedule your appointment, please contact [facility] [phone]  and follow the prompts.  Office hours are 8:00 a.m. to 4:30 p.m. Monday - Friday. Please note that voicemails left after 4:00 p.m. may not be returned until the following business day.  We are closed weekends and major holidays. You have access to a nurse at all times for urgent questions. Please call the main number to the clinic [phone] and follow the prompts.  For any non-urgent questions, you may also contact your provider using MyChart. We now offer e-Visits for anyone 37 and older to request care online for non-urgent symptoms. For details visit [email or web address].   Also download the MyChart app! Go to the app store, search "MyChart", open the app, select [facility], and log in with your MyChart username and password.  Due to Covid, a mask is required upon entering the hospital/clinic. If you do not have a mask, one will be given to you upon arrival. For doctor visits, patients may have 1 support person aged 57 or older with them. For treatment visits, patients cannot have anyone with them due to current Covid guidelines and our immunocompromised population.

## 2022-02-25 ENCOUNTER — Ambulatory Visit: Payer: PPO

## 2022-02-25 ENCOUNTER — Encounter (HOSPITAL_COMMUNITY): Payer: Self-pay

## 2022-02-25 ENCOUNTER — Other Ambulatory Visit (HOSPITAL_COMMUNITY): Payer: Self-pay

## 2022-02-25 ENCOUNTER — Inpatient Hospital Stay (HOSPITAL_COMMUNITY): Payer: PPO

## 2022-02-25 VITALS — BP 116/73 | HR 66 | Temp 97.1°F | Resp 18

## 2022-02-25 DIAGNOSIS — Z5111 Encounter for antineoplastic chemotherapy: Secondary | ICD-10-CM | POA: Diagnosis not present

## 2022-02-25 DIAGNOSIS — C3411 Malignant neoplasm of upper lobe, right bronchus or lung: Secondary | ICD-10-CM

## 2022-02-25 MED ORDER — ALPRAZOLAM 0.5 MG PO TABS: 0.5000 mg | ORAL_TABLET | Freq: Two times a day (BID) | ORAL | 0 refills | Status: DC | PRN

## 2022-02-25 MED ORDER — SUCRALFATE 1 GM/10ML PO SUSP: 1.0000 g | Freq: Four times a day (QID) | ORAL | 1 refills | Status: DC | PRN

## 2022-02-25 MED ORDER — SODIUM CHLORIDE 0.9 % IV SOLN: Freq: Once | INTRAVENOUS | Status: AC

## 2022-02-25 MED ORDER — HEPARIN SOD (PORK) LOCK FLUSH 100 UNIT/ML IV SOLN
500.0000 [IU] | Freq: Once | INTRAVENOUS | Status: AC | PRN
  Administered 2022-02-25: 500 [IU]

## 2022-02-25 MED ORDER — SODIUM CHLORIDE 0.9 % IV SOLN
100.0000 mg/m2 | Freq: Once | INTRAVENOUS | Status: AC
  Administered 2022-02-25: 230 mg via INTRAVENOUS
  Filled 2022-02-25: qty 11.5

## 2022-02-25 MED ORDER — SODIUM CHLORIDE 0.9% FLUSH
10.0000 mL | INTRAVENOUS | Status: DC | PRN
  Administered 2022-02-25 (×2): 10 mL

## 2022-02-25 MED ORDER — SODIUM CHLORIDE 0.9 % IV SOLN
10.0000 mg | Freq: Once | INTRAVENOUS | Status: AC
  Administered 2022-02-25: 10 mg via INTRAVENOUS
  Filled 2022-02-25: qty 10

## 2022-02-25 NOTE — Progress Notes (Signed)
Patient tolerated chemotherapy with no complaints voiced. Side effects with management reviewed understanding verbalized. Port site clean and dry with no bruising or swelling noted at site. Good blood return noted before and after administration of chemotherapy. Band aid applied. Patient left in satisfactory condition with VSS and no s/s of distress noted. 

## 2022-02-25 NOTE — Patient Instructions (Signed)
[facility]  Discharge Instructions: Thank you for choosing [facility] to provide your oncology and hematology care.  If you have a lab appointment with the [facility], please come in thru the Main Entrance and check in at the main information desk.  Wear comfortable clothing and clothing appropriate for easy access to any Portacath or PICC line.   We strive to give you quality time with your provider. You may need to reschedule your appointment if you arrive late (15 or more minutes).  Arriving late affects you and other patients whose appointments are after yours.  Also, if you miss three or more appointments without notifying the office, you may be dismissed from the clinic at the provider's discretion.      For prescription refill requests, have your pharmacy contact our office and allow 72 hours for refills to be completed.    Today you received the following chemotherapy and/or immunotherapy agents Etoposide, return as scheduled.   To help prevent nausea and vomiting after your treatment, we encourage you to take your nausea medication as directed.  BELOW ARE SYMPTOMS THAT SHOULD BE REPORTED IMMEDIATELY: *FEVER GREATER THAN 100.4 F (38 C) OR HIGHER *CHILLS OR SWEATING *NAUSEA AND VOMITING THAT IS NOT CONTROLLED WITH YOUR NAUSEA MEDICATION *UNUSUAL SHORTNESS OF BREATH *UNUSUAL BRUISING OR BLEEDING *URINARY PROBLEMS (pain or burning when urinating, or frequent urination) *BOWEL PROBLEMS (unusual diarrhea, constipation, pain near the anus) TENDERNESS IN MOUTH AND THROAT WITH OR WITHOUT PRESENCE OF ULCERS (sore throat, sores in mouth, or a toothache) UNUSUAL RASH, SWELLING OR PAIN  UNUSUAL VAGINAL DISCHARGE OR ITCHING   Items with * indicate a potential emergency and should be followed up as soon as possible or go to the Emergency Department if any problems should occur.  Please show the CHEMOTHERAPY ALERT CARD or IMMUNOTHERAPY ALERT CARD at check-in to  the Emergency Department and triage nurse.  Should you have questions after your visit or need to cancel or reschedule your appointment, please contact [facility] [phone]  and follow the prompts.  Office hours are 8:00 a.m. to 4:30 p.m. Monday - Friday. Please note that voicemails left after 4:00 p.m. may not be returned until the following business day.  We are closed weekends and major holidays. You have access to a nurse at all times for urgent questions. Please call the main number to the clinic [phone] and follow the prompts.  For any non-urgent questions, you may also contact your provider using MyChart. We now offer e-Visits for anyone 9 and older to request care online for non-urgent symptoms. For details visit [email or web address].   Also download the MyChart app! Go to the app store, search "MyChart", open the app, select [facility], and log in with your MyChart username and password.  Due to Covid, a mask is required upon entering the hospital/clinic. If you do not have a mask, one will be given to you upon arrival. For doctor visits, patients may have 1 support person aged 59 or older with them. For treatment visits, patients cannot have anyone with them due to current Covid guidelines and our immunocompromised population.

## 2022-02-26 ENCOUNTER — Ambulatory Visit: Payer: PPO

## 2022-02-26 ENCOUNTER — Inpatient Hospital Stay (HOSPITAL_COMMUNITY): Payer: PPO

## 2022-02-26 ENCOUNTER — Encounter (HOSPITAL_COMMUNITY): Payer: Self-pay

## 2022-02-26 VITALS — BP 108/73 | HR 73 | Temp 97.5°F | Resp 20

## 2022-02-26 DIAGNOSIS — C3411 Malignant neoplasm of upper lobe, right bronchus or lung: Secondary | ICD-10-CM

## 2022-02-26 DIAGNOSIS — Z5111 Encounter for antineoplastic chemotherapy: Secondary | ICD-10-CM | POA: Diagnosis not present

## 2022-02-26 MED ORDER — PEGFILGRASTIM-CBQV 6 MG/0.6ML ~~LOC~~ SOSY
6.0000 mg | PREFILLED_SYRINGE | Freq: Once | SUBCUTANEOUS | Status: AC
  Administered 2022-02-26: 6 mg via SUBCUTANEOUS
  Filled 2022-02-26: qty 0.6

## 2022-02-26 NOTE — Progress Notes (Signed)
Patient assessed for 24-hr follow-up, patient states he is having some indigestion. Carafate was sent to patient's pharmacy yesterday. No other symptoms at this time.

## 2022-02-26 NOTE — Progress Notes (Signed)
Patient tolerated injection with no complaints voiced. Site clean and dry with no bruising or swelling noted at site. See MAR for details. Band aid applied.  Patient stable during and after injection. VSS with discharge and left in satisfactory condition with no s/s of distress noted.  

## 2022-02-26 NOTE — Patient Instructions (Signed)
[facility]  Discharge Instructions: Thank you for choosing [facility] to provide your oncology and hematology care.  If you have a lab appointment with the [facility], please come in thru the Main Entrance and check in at the main information desk.  Wear comfortable clothing and clothing appropriate for easy access to any Portacath or PICC line.   We strive to give you quality time with your provider. You may need to reschedule your appointment if you arrive late (15 or more minutes).  Arriving late affects you and other patients whose appointments are after yours.  Also, if you miss three or more appointments without notifying the office, you may be dismissed from the clinic at the provider's discretion.      For prescription refill requests, have your pharmacy contact our office and allow 72 hours for refills to be completed.    Today you received the following Udenyca, return as scheduled.  To help prevent nausea and vomiting after your treatment, we encourage you to take your nausea medication as directed.  BELOW ARE SYMPTOMS THAT SHOULD BE REPORTED IMMEDIATELY: *FEVER GREATER THAN 100.4 F (38 C) OR HIGHER *CHILLS OR SWEATING *NAUSEA AND VOMITING THAT IS NOT CONTROLLED WITH YOUR NAUSEA MEDICATION *UNUSUAL SHORTNESS OF BREATH *UNUSUAL BRUISING OR BLEEDING *URINARY PROBLEMS (pain or burning when urinating, or frequent urination) *BOWEL PROBLEMS (unusual diarrhea, constipation, pain near the anus) TENDERNESS IN MOUTH AND THROAT WITH OR WITHOUT PRESENCE OF ULCERS (sore throat, sores in mouth, or a toothache) UNUSUAL RASH, SWELLING OR PAIN  UNUSUAL VAGINAL DISCHARGE OR ITCHING   Items with * indicate a potential emergency and should be followed up as soon as possible or go to the Emergency Department if any problems should occur.  Please show the CHEMOTHERAPY ALERT CARD or IMMUNOTHERAPY ALERT CARD at check-in to the Emergency Department and triage  nurse.  Should you have questions after your visit or need to cancel or reschedule your appointment, please contact [facility] [phone]  and follow the prompts.  Office hours are 8:00 a.m. to 4:30 p.m. Monday - Friday. Please note that voicemails left after 4:00 p.m. may not be returned until the following business day.  We are closed weekends and major holidays. You have access to a nurse at all times for urgent questions. Please call the main number to the clinic [phone] and follow the prompts.  For any non-urgent questions, you may also contact your provider using MyChart. We now offer e-Visits for anyone 24 and older to request care online for non-urgent symptoms. For details visit [email or web address].   Also download the MyChart app! Go to the app store, search "MyChart", open the app, select [facility], and log in with your MyChart username and password.  Due to Covid, a mask is required upon entering the hospital/clinic. If you do not have a mask, one will be given to you upon arrival. For doctor visits, patients may have 1 support person aged 71 or older with them. For treatment visits, patients cannot have anyone with them due to current Covid guidelines and our immunocompromised population.

## 2022-02-27 ENCOUNTER — Ambulatory Visit: Payer: PPO

## 2022-02-27 ENCOUNTER — Ambulatory Visit (HOSPITAL_COMMUNITY): Payer: PPO

## 2022-03-03 ENCOUNTER — Ambulatory Visit: Payer: PPO

## 2022-03-03 ENCOUNTER — Other Ambulatory Visit (HOSPITAL_COMMUNITY): Payer: Self-pay | Admitting: *Deleted

## 2022-03-03 ENCOUNTER — Telehealth (HOSPITAL_COMMUNITY): Payer: Self-pay | Admitting: *Deleted

## 2022-03-03 DIAGNOSIS — C3411 Malignant neoplasm of upper lobe, right bronchus or lung: Secondary | ICD-10-CM

## 2022-03-03 DIAGNOSIS — E876 Hypokalemia: Secondary | ICD-10-CM

## 2022-03-03 NOTE — Telephone Encounter (Signed)
Received call from patient and wife stating that he has been symptomatic for the past few days, following treatment.  States having intractable hiccups, for which I offered thorazine and he declined.  Other symptoms include; difficulty swallowing, non productive cough without fever and generalized  malaise. Stated "I feel like I am going to die". [place] are at the beach currently and I advised that he go to the Emergency Room, which he also declined.  Will not be back in town until tomorrow afternoon.  Appointment made for port flush/Lab and possible fluids for Thursday.  Stressed that if he feels any worse or no better, he should get immediate attention.  Both he and wife verbalized understanding.

## 2022-03-04 ENCOUNTER — Other Ambulatory Visit (HOSPITAL_COMMUNITY): Payer: Self-pay | Admitting: *Deleted

## 2022-03-04 ENCOUNTER — Ambulatory Visit (HOSPITAL_COMMUNITY): Payer: PPO | Admitting: Hematology

## 2022-03-04 ENCOUNTER — Encounter (HOSPITAL_COMMUNITY): Payer: Self-pay | Admitting: Hematology

## 2022-03-04 ENCOUNTER — Other Ambulatory Visit (HOSPITAL_COMMUNITY): Payer: PPO

## 2022-03-04 ENCOUNTER — Other Ambulatory Visit: Payer: Self-pay

## 2022-03-04 ENCOUNTER — Ambulatory Visit: Payer: PPO

## 2022-03-04 MED ORDER — SUCRALFATE 1 GM/10ML PO SUSP: 1.0000 g | Freq: Four times a day (QID) | ORAL | 3 refills | Status: DC | PRN

## 2022-03-04 MED ORDER — BACLOFEN 10 MG PO TABS: 10.0000 mg | ORAL_TABLET | Freq: Three times a day (TID) | ORAL | 0 refills | Status: DC

## 2022-03-04 NOTE — Progress Notes (Signed)
Followed up with patient regarding symptoms.  Currently leaving the beach today and has appt for lab and possible fluids tomorrow.  States that he would be agreeable to Baclofen suggested by Dr. Delton Coombes.  Script sent to [facility].

## 2022-03-05 ENCOUNTER — Inpatient Hospital Stay (HOSPITAL_COMMUNITY): Payer: PPO

## 2022-03-05 ENCOUNTER — Ambulatory Visit: Payer: PPO

## 2022-03-05 ENCOUNTER — Inpatient Hospital Stay (HOSPITAL_COMMUNITY): Payer: PPO | Attending: Hematology

## 2022-03-05 ENCOUNTER — Encounter (HOSPITAL_COMMUNITY): Payer: Self-pay

## 2022-03-05 DIAGNOSIS — E876 Hypokalemia: Secondary | ICD-10-CM | POA: Diagnosis not present

## 2022-03-05 DIAGNOSIS — I10 Essential (primary) hypertension: Secondary | ICD-10-CM | POA: Diagnosis not present

## 2022-03-05 DIAGNOSIS — F17210 Nicotine dependence, cigarettes, uncomplicated: Secondary | ICD-10-CM | POA: Diagnosis not present

## 2022-03-05 DIAGNOSIS — K209 Esophagitis, unspecified without bleeding: Secondary | ICD-10-CM | POA: Insufficient documentation

## 2022-03-05 DIAGNOSIS — Z8051 Family history of malignant neoplasm of kidney: Secondary | ICD-10-CM | POA: Diagnosis not present

## 2022-03-05 DIAGNOSIS — C3491 Malignant neoplasm of unspecified part of right bronchus or lung: Secondary | ICD-10-CM

## 2022-03-05 DIAGNOSIS — Z5111 Encounter for antineoplastic chemotherapy: Secondary | ICD-10-CM | POA: Diagnosis not present

## 2022-03-05 DIAGNOSIS — Z5112 Encounter for antineoplastic immunotherapy: Secondary | ICD-10-CM | POA: Insufficient documentation

## 2022-03-05 DIAGNOSIS — C3411 Malignant neoplasm of upper lobe, right bronchus or lung: Secondary | ICD-10-CM | POA: Insufficient documentation

## 2022-03-05 DIAGNOSIS — Z5189 Encounter for other specified aftercare: Secondary | ICD-10-CM | POA: Insufficient documentation

## 2022-03-05 LAB — CBC WITH DIFFERENTIAL/PLATELET
Abs Immature Granulocytes: 0.04 10*3/uL (ref 0.00–0.07)
Basophils Absolute: 0 10*3/uL (ref 0.0–0.1)
Basophils Relative: 3 %
Eosinophils Absolute: 0 10*3/uL (ref 0.0–0.5)
Eosinophils Relative: 2 %
HCT: 40.1 % (ref 39.0–52.0)
Hemoglobin: 13.8 g/dL (ref 13.0–17.0)
Immature Granulocytes: 5 %
Lymphocytes Relative: 36 %
Lymphs Abs: 0.3 10*3/uL — ABNORMAL LOW (ref 0.7–4.0)
MCH: 30.5 pg (ref 26.0–34.0)
MCHC: 34.4 g/dL (ref 30.0–36.0)
MCV: 88.7 fL (ref 80.0–100.0)
Monocytes Absolute: 0.2 10*3/uL (ref 0.1–1.0)
Monocytes Relative: 26 %
Neutro Abs: 0.3 10*3/uL — CL (ref 1.7–7.7)
Neutrophils Relative %: 28 %
Platelets: 47 10*3/uL — ABNORMAL LOW (ref 150–400)
RBC: 4.52 MIL/uL (ref 4.22–5.81)
RDW: 13.2 % (ref 11.5–15.5)
WBC: 0.9 10*3/uL — CL (ref 4.0–10.5)
nRBC: 0 % (ref 0.0–0.2)

## 2022-03-05 LAB — COMPREHENSIVE METABOLIC PANEL
ALT: 11 U/L (ref 0–44)
AST: 10 U/L — ABNORMAL LOW (ref 15–41)
Albumin: 3.3 g/dL — ABNORMAL LOW (ref 3.5–5.0)
Alkaline Phosphatase: 66 U/L (ref 38–126)
Anion gap: 7 (ref 5–15)
BUN: 11 mg/dL (ref 8–23)
CO2: 31 mmol/L (ref 22–32)
Calcium: 8.1 mg/dL — ABNORMAL LOW (ref 8.9–10.3)
Chloride: 95 mmol/L — ABNORMAL LOW (ref 98–111)
Creatinine, Ser: 0.72 mg/dL (ref 0.61–1.24)
GFR, Estimated: >60 mL/min (ref >60)
Glucose, Bld: 102 mg/dL — ABNORMAL HIGH (ref 70–99)
Potassium: 2.9 mmol/L — ABNORMAL LOW (ref 3.5–5.1)
Sodium: 133 mmol/L — ABNORMAL LOW (ref 135–145)
Total Bilirubin: 1.1 mg/dL (ref 0.3–1.2)
Total Protein: 6.4 g/dL — ABNORMAL LOW (ref 6.5–8.1)

## 2022-03-05 LAB — MAGNESIUM: Magnesium: 1.5 mg/dL — ABNORMAL LOW (ref 1.7–2.4)

## 2022-03-05 MED ORDER — SODIUM CHLORIDE 0.9% FLUSH
10.0000 mL | Freq: Once | INTRAVENOUS | Status: AC | PRN
  Administered 2022-03-05: 10 mL

## 2022-03-05 MED ORDER — POTASSIUM CHLORIDE IN NACL 20-0.9 MEQ/L-% IV SOLN
Freq: Once | INTRAVENOUS | Status: AC
  Filled 2022-03-05: qty 1000

## 2022-03-05 MED ORDER — LIDOCAINE VISCOUS HCL 2 % MT SOLN: 15.0000 mL | OROMUCOSAL | 6 refills | Status: DC | PRN

## 2022-03-05 MED ORDER — MAGNESIUM SULFATE 2 GM/50ML IV SOLN
2.0000 g | Freq: Once | INTRAVENOUS | Status: AC
  Administered 2022-03-05: 2 g via INTRAVENOUS
  Filled 2022-03-05: qty 50

## 2022-03-05 MED ORDER — HEPARIN SOD (PORK) LOCK FLUSH 100 UNIT/ML IV SOLN
500.0000 [IU] | Freq: Once | INTRAVENOUS | Status: AC | PRN
  Administered 2022-03-05: 500 [IU]

## 2022-03-05 NOTE — Progress Notes (Signed)
CRITICAL VALUE ALERT Critical value received:  WBC 0.9, ANC 0.3 Date of notification:  03-05-22 Time of notification: 1035 Critical value read back:  Yes.   Nurse who received alert:  C. Judd Mccubbin RN MD notified time and response:  8453, no new orders.

## 2022-03-05 NOTE — Patient Instructions (Signed)
[facility]  Discharge Instructions: Thank you for choosing [facility] to provide your oncology and hematology care.  If you have a lab appointment with the [facility], please come in thru the Main Entrance and check in at the main information desk.  Wear comfortable clothing and clothing appropriate for easy access to any Portacath or PICC line.   We strive to give you quality time with your provider. You may need to reschedule your appointment if you arrive late (15 or more minutes).  Arriving late affects you and other patients whose appointments are after yours.  Also, if you miss three or more appointments without notifying the office, you may be dismissed from the clinic at the provider's discretion.      For prescription refill requests, have your pharmacy contact our office and allow 72 hours for refills to be completed.    Today you received the following house fluids, return as scheduled.   To help prevent nausea and vomiting after your treatment, we encourage you to take your nausea medication as directed.  BELOW ARE SYMPTOMS THAT SHOULD BE REPORTED IMMEDIATELY: *FEVER GREATER THAN 100.4 F (38 C) OR HIGHER *CHILLS OR SWEATING *NAUSEA AND VOMITING THAT IS NOT CONTROLLED WITH YOUR NAUSEA MEDICATION *UNUSUAL SHORTNESS OF BREATH *UNUSUAL BRUISING OR BLEEDING *URINARY PROBLEMS (pain or burning when urinating, or frequent urination) *BOWEL PROBLEMS (unusual diarrhea, constipation, pain near the anus) TENDERNESS IN MOUTH AND THROAT WITH OR WITHOUT PRESENCE OF ULCERS (sore throat, sores in mouth, or a toothache) UNUSUAL RASH, SWELLING OR PAIN  UNUSUAL VAGINAL DISCHARGE OR ITCHING   Items with * indicate a potential emergency and should be followed up as soon as possible or go to the Emergency Department if any problems should occur.  Please show the CHEMOTHERAPY ALERT CARD or IMMUNOTHERAPY ALERT CARD at check-in to the Emergency Department and triage  nurse.  Should you have questions after your visit or need to cancel or reschedule your appointment, please contact [facility] [phone]  and follow the prompts.  Office hours are 8:00 a.m. to 4:30 p.m. Monday - Friday. Please note that voicemails left after 4:00 p.m. may not be returned until the following business day.  We are closed weekends and major holidays. You have access to a nurse at all times for urgent questions. Please call the main number to the clinic [phone] and follow the prompts.  For any non-urgent questions, you may also contact your provider using MyChart. We now offer e-Visits for anyone 66 and older to request care online for non-urgent symptoms. For details visit [email or web address].   Also download the MyChart app! Go to the app store, search "MyChart", open the app, select , and log in with your MyChart username and password.  Due to Covid, a mask is required upon entering the hospital/clinic. If you do not have a mask, one will be given to you upon arrival. For doctor visits, patients may have 1 support person aged 49 or older with them. For treatment visits, patients cannot have anyone with them due to current Covid guidelines and our immunocompromised population.

## 2022-03-05 NOTE — Addendum Note (Signed)
Addended by: Joie Bimler on: 03/05/2022 01:42 PM   Modules accepted: Orders

## 2022-03-05 NOTE — Progress Notes (Signed)
Patient presents today for possible fluids. He states his throat is still killing him and he is unable to get much down, he feels awful. He did pick up the Baclofen that was sent in yesterday, states he thinks it helps a little bit. He denies n/v or constipation/diarrhea. His Mag is 1.5 today and Potassium 2.9.Dr. Delton Coombes made aware and RN received orders for house fluids. Received orders from Dr. Delton Coombes for house fluids over 2 hours.

## 2022-03-06 ENCOUNTER — Encounter (HOSPITAL_COMMUNITY): Payer: Self-pay

## 2022-03-06 ENCOUNTER — Ambulatory Visit: Payer: PPO

## 2022-03-06 ENCOUNTER — Inpatient Hospital Stay (HOSPITAL_COMMUNITY): Payer: PPO

## 2022-03-06 VITALS — BP 128/94 | HR 82 | Temp 97.4°F | Resp 18

## 2022-03-06 DIAGNOSIS — Z5111 Encounter for antineoplastic chemotherapy: Secondary | ICD-10-CM | POA: Diagnosis not present

## 2022-03-06 DIAGNOSIS — C3491 Malignant neoplasm of unspecified part of right bronchus or lung: Secondary | ICD-10-CM

## 2022-03-06 MED ORDER — ALUM & MAG HYDROXIDE-SIMETH 200-200-20 MG/5ML PO SUSP
30.0000 mL | Freq: Once | ORAL | Status: AC
  Administered 2022-03-06: 30 mL via ORAL
  Filled 2022-03-06: qty 30

## 2022-03-06 MED ORDER — SODIUM CHLORIDE 0.9% FLUSH
10.0000 mL | Freq: Once | INTRAVENOUS | Status: AC | PRN
  Administered 2022-03-06: 10 mL

## 2022-03-06 MED ORDER — MAGNESIUM SULFATE 2 GM/50ML IV SOLN
2.0000 g | Freq: Once | INTRAVENOUS | Status: AC
  Administered 2022-03-06: 2 g via INTRAVENOUS
  Filled 2022-03-06: qty 50

## 2022-03-06 MED ORDER — HEPARIN SOD (PORK) LOCK FLUSH 100 UNIT/ML IV SOLN
500.0000 [IU] | Freq: Once | INTRAVENOUS | Status: AC | PRN
  Administered 2022-03-06: 500 [IU]

## 2022-03-06 MED ORDER — POTASSIUM CHLORIDE IN NACL 20-0.9 MEQ/L-% IV SOLN
Freq: Once | INTRAVENOUS | Status: AC
  Filled 2022-03-06: qty 1000

## 2022-03-06 NOTE — Progress Notes (Signed)
Patient presents today for hydration fluids (0.9 % NaCl with Kcl 20 mEq / liter and 2 Grams Magnesium Sulfate ). Patient requesting Maalox for indigestion. Vital signs stable.   Message received from R. Pennington PA may give patient Maalox/Mylanta 30 mls x 1 dose today PO.   Treatment given today per MD orders. Tolerated infusion without adverse affects. Vital signs stable. No complaints at this time. Discharged from clinic by wheel chair accompanied by his wife in stable condition. Alert and oriented x 3. F/U with [facility] as scheduled.

## 2022-03-06 NOTE — Patient Instructions (Signed)
[facility]  Discharge Instructions: Thank you for choosing [facility] to provide your oncology and hematology care.  If you have a lab appointment with the [facility], please come in thru the Main Entrance and check in at the main information desk.  Wear comfortable clothing and clothing appropriate for easy access to any Portacath or PICC line.   We strive to give you quality time with your provider. You may need to reschedule your appointment if you arrive late (15 or more minutes).  Arriving late affects you and other patients whose appointments are after yours.  Also, if you miss three or more appointments without notifying the office, you may be dismissed from the clinic at the provider's discretion.      For prescription refill requests, have your pharmacy contact our office and allow 72 hours for refills to be completed.    Today you received the following chemotherapy and/or immunotherapy agents Hydration.       To help prevent nausea and vomiting after your treatment, we encourage you to take your nausea medication as directed.  BELOW ARE SYMPTOMS THAT SHOULD BE REPORTED IMMEDIATELY: *FEVER GREATER THAN 100.4 F (38 C) OR HIGHER *CHILLS OR SWEATING *NAUSEA AND VOMITING THAT IS NOT CONTROLLED WITH YOUR NAUSEA MEDICATION *UNUSUAL SHORTNESS OF BREATH *UNUSUAL BRUISING OR BLEEDING *URINARY PROBLEMS (pain or burning when urinating, or frequent urination) *BOWEL PROBLEMS (unusual diarrhea, constipation, pain near the anus) TENDERNESS IN MOUTH AND THROAT WITH OR WITHOUT PRESENCE OF ULCERS (sore throat, sores in mouth, or a toothache) UNUSUAL RASH, SWELLING OR PAIN  UNUSUAL VAGINAL DISCHARGE OR ITCHING   Items with * indicate a potential emergency and should be followed up as soon as possible or go to the Emergency Department if any problems should occur.  Please show the CHEMOTHERAPY ALERT CARD or IMMUNOTHERAPY ALERT CARD at check-in to the Emergency  Department and triage nurse.  Should you have questions after your visit or need to cancel or reschedule your appointment, please contact [facility] [phone]  and follow the prompts.  Office hours are 8:00 a.m. to 4:30 p.m. Monday - Friday. Please note that voicemails left after 4:00 p.m. may not be returned until the following business day.  We are closed weekends and major holidays. You have access to a nurse at all times for urgent questions. Please call the main number to the clinic [phone] and follow the prompts.  For any non-urgent questions, you may also contact your provider using MyChart. We now offer e-Visits for anyone 79 and older to request care online for non-urgent symptoms. For details visit [email or web address].   Also download the MyChart app! Go to the app store, search "MyChart", open the app, select [facility], and log in with your MyChart username and password.  Due to Covid, a mask is required upon entering the hospital/clinic. If you do not have a mask, one will be given to you upon arrival. For doctor visits, patients may have 1 support person aged 44 or older with them. For treatment visits, patients cannot have anyone with them due to current Covid guidelines and our immunocompromised population.

## 2022-03-07 ENCOUNTER — Other Ambulatory Visit (HOSPITAL_COMMUNITY): Payer: Self-pay | Admitting: Hematology

## 2022-03-09 ENCOUNTER — Ambulatory Visit: Payer: PPO

## 2022-03-09 ENCOUNTER — Encounter (HOSPITAL_COMMUNITY): Payer: Self-pay | Admitting: Hematology

## 2022-03-10 ENCOUNTER — Ambulatory Visit: Payer: PPO

## 2022-03-11 ENCOUNTER — Ambulatory Visit: Payer: PPO

## 2022-03-12 ENCOUNTER — Ambulatory Visit: Payer: PPO

## 2022-03-13 ENCOUNTER — Ambulatory Visit: Payer: PPO

## 2022-03-16 ENCOUNTER — Inpatient Hospital Stay (HOSPITAL_COMMUNITY): Payer: PPO

## 2022-03-16 ENCOUNTER — Inpatient Hospital Stay (HOSPITAL_BASED_OUTPATIENT_CLINIC_OR_DEPARTMENT_OTHER): Payer: PPO | Admitting: Hematology

## 2022-03-16 ENCOUNTER — Other Ambulatory Visit (HOSPITAL_COMMUNITY): Payer: Self-pay | Admitting: *Deleted

## 2022-03-16 ENCOUNTER — Ambulatory Visit: Payer: PPO

## 2022-03-16 VITALS — BP 107/66 | HR 79 | Temp 96.8°F | Resp 18

## 2022-03-16 DIAGNOSIS — Z5111 Encounter for antineoplastic chemotherapy: Secondary | ICD-10-CM | POA: Diagnosis not present

## 2022-03-16 DIAGNOSIS — C3411 Malignant neoplasm of upper lobe, right bronchus or lung: Secondary | ICD-10-CM

## 2022-03-16 DIAGNOSIS — C3491 Malignant neoplasm of unspecified part of right bronchus or lung: Secondary | ICD-10-CM | POA: Diagnosis not present

## 2022-03-16 LAB — CBC WITH DIFFERENTIAL/PLATELET
Abs Immature Granulocytes: 0.32 10*3/uL — ABNORMAL HIGH (ref 0.00–0.07)
Basophils Absolute: 0.1 10*3/uL (ref 0.0–0.1)
Basophils Relative: 1 %
Eosinophils Absolute: 0 10*3/uL (ref 0.0–0.5)
Eosinophils Relative: 0 %
HCT: 41.2 % (ref 39.0–52.0)
Hemoglobin: 14 g/dL (ref 13.0–17.0)
Immature Granulocytes: 4 %
Lymphocytes Relative: 8 %
Lymphs Abs: 0.7 10*3/uL (ref 0.7–4.0)
MCH: 30.4 pg (ref 26.0–34.0)
MCHC: 34 g/dL (ref 30.0–36.0)
MCV: 89.4 fL (ref 80.0–100.0)
Monocytes Absolute: 0.8 10*3/uL (ref 0.1–1.0)
Monocytes Relative: 9 %
Neutro Abs: 7 10*3/uL (ref 1.7–7.7)
Neutrophils Relative %: 78 %
Platelets: 376 10*3/uL (ref 150–400)
RBC: 4.61 MIL/uL (ref 4.22–5.81)
RDW: 14.1 % (ref 11.5–15.5)
WBC: 8.9 10*3/uL (ref 4.0–10.5)
nRBC: 0 % (ref 0.0–0.2)

## 2022-03-16 LAB — COMPREHENSIVE METABOLIC PANEL
ALT: 13 U/L (ref 0–44)
AST: 17 U/L (ref 15–41)
Albumin: 3.3 g/dL — ABNORMAL LOW (ref 3.5–5.0)
Alkaline Phosphatase: 79 U/L (ref 38–126)
Anion gap: 9 (ref 5–15)
BUN: 9 mg/dL (ref 8–23)
CO2: 32 mmol/L (ref 22–32)
Calcium: 8.1 mg/dL — ABNORMAL LOW (ref 8.9–10.3)
Chloride: 94 mmol/L — ABNORMAL LOW (ref 98–111)
Creatinine, Ser: 0.92 mg/dL (ref 0.61–1.24)
GFR, Estimated: >60 mL/min (ref >60)
Glucose, Bld: 136 mg/dL — ABNORMAL HIGH (ref 70–99)
Potassium: 2.6 mmol/L — CL (ref 3.5–5.1)
Sodium: 135 mmol/L (ref 135–145)
Total Bilirubin: 0.6 mg/dL (ref 0.3–1.2)
Total Protein: 6.8 g/dL (ref 6.5–8.1)

## 2022-03-16 LAB — MAGNESIUM: Magnesium: 2.1 mg/dL (ref 1.7–2.4)

## 2022-03-16 MED ORDER — POTASSIUM CHLORIDE 10 MEQ/100ML IV SOLN
10.0000 meq | INTRAVENOUS | Status: AC
  Administered 2022-03-16 (×2): 10 meq via INTRAVENOUS
  Filled 2022-03-16: qty 100

## 2022-03-16 MED ORDER — ALPRAZOLAM 0.5 MG PO TABS: 0.5000 mg | ORAL_TABLET | Freq: Two times a day (BID) | ORAL | 5 refills | Status: DC | PRN

## 2022-03-16 MED ORDER — SODIUM CHLORIDE 0.9 % IV SOLN: Freq: Once | INTRAVENOUS | Status: AC

## 2022-03-16 MED ORDER — PALONOSETRON HCL INJECTION 0.25 MG/5ML
0.2500 mg | Freq: Once | INTRAVENOUS | Status: AC
  Administered 2022-03-16: 0.25 mg via INTRAVENOUS
  Filled 2022-03-16: qty 5

## 2022-03-16 MED ORDER — SODIUM CHLORIDE 0.9 % IV SOLN
500.0000 mg | Freq: Once | INTRAVENOUS | Status: AC
  Administered 2022-03-16: 500 mg via INTRAVENOUS
  Filled 2022-03-16: qty 50

## 2022-03-16 MED ORDER — SODIUM CHLORIDE 0.9 % IV SOLN
80.0000 mg/m2 | Freq: Once | INTRAVENOUS | Status: AC
  Administered 2022-03-16: 180 mg via INTRAVENOUS
  Filled 2022-03-16: qty 9

## 2022-03-16 MED ORDER — SODIUM CHLORIDE 0.9 % IV SOLN
150.0000 mg | Freq: Once | INTRAVENOUS | Status: AC
  Administered 2022-03-16: 150 mg via INTRAVENOUS
  Filled 2022-03-16: qty 150

## 2022-03-16 MED ORDER — SODIUM CHLORIDE 0.9 % IV SOLN
1200.0000 mg | Freq: Once | INTRAVENOUS | Status: AC
  Administered 2022-03-16: 1200 mg via INTRAVENOUS
  Filled 2022-03-16: qty 20

## 2022-03-16 MED ORDER — HEPARIN SOD (PORK) LOCK FLUSH 100 UNIT/ML IV SOLN
500.0000 [IU] | Freq: Once | INTRAVENOUS | Status: AC | PRN
  Administered 2022-03-16: 500 [IU]

## 2022-03-16 MED ORDER — SODIUM CHLORIDE 0.9% FLUSH
10.0000 mL | INTRAVENOUS | Status: DC | PRN
  Administered 2022-03-16: 10 mL

## 2022-03-16 MED ORDER — SUCRALFATE 1 GM/10ML PO SUSP: ORAL | 6 refills | Status: DC

## 2022-03-16 MED ORDER — SODIUM CHLORIDE 0.9 % IV SOLN
10.0000 mg | Freq: Once | INTRAVENOUS | Status: AC
  Administered 2022-03-16: 10 mg via INTRAVENOUS
  Filled 2022-03-16: qty 10

## 2022-03-16 NOTE — Progress Notes (Signed)
CRITICAL VALUE ALERT Critical value received:  K+ 2.6 Date of notification:  03-16-22 Time of notification: 0958 Critical value read back:  Yes.   Nurse who received alert:  C. Leiya Keesey RN MD notified time and response:  [phone], will order additional potassium per MD.

## 2022-03-16 NOTE — Progress Notes (Signed)
Patient has been examined by Dr. Delton Coombes, and vital signs and labs have been reviewed. ANC, Creatinine, LFTs, hemoglobin, and platelets are within treatment parameters per M.D. - pt may proceed with treatment.  Pt will also receive IV K+ 40 mEq for critically low K+ of 2.6.

## 2022-03-16 NOTE — Patient Instructions (Signed)
[facility] at [facility] Discharge Instructions   You were seen and examined today by Dr. Delton Coombes.  He reviewed the results of your lab work which is mostly normal/stable. Your potassium is critically low at 2.6. We will give IV potassium in the clinic today.  We will proceed with your treatment today.  Return as scheduled.    Thank you for choosing [facility] at [facility] to provide your oncology and hematology care.  To afford each patient quality time with our provider, please arrive at least 15 minutes before your scheduled appointment time.   If you have a lab appointment with the [facility] please come in thru the Main Entrance and check in at the main information desk.  You need to re-schedule your appointment should you arrive 10 or more minutes late.  We strive to give you quality time with our providers, and arriving late affects you and other patients whose appointments are after yours.  Also, if you no show three or more times for appointments you may be dismissed from the clinic at the providers discretion.     Again, thank you for choosing [facility].  Our hope is that these requests will decrease the amount of time that you wait before being seen by our physicians.       _____________________________________________________________  Should you have questions after your visit to [facility], please contact our office at [phone] and follow the prompts.  Our office hours are 8:00 a.m. and 4:30 p.m. Monday - Friday.  Please note that voicemails left after 4:00 p.m. may not be returned until the following business day.  We are closed weekends and major holidays.  You do have access to a nurse 24-7, just call the main number to the clinic [phone] and do not press any options, hold on the line and a nurse will answer the phone.    For prescription refill requests, have your pharmacy contact  our office and allow 72 hours.    Due to Covid, you will need to wear a mask upon entering the hospital. If you do not have a mask, a mask will be given to you at the Main Entrance upon arrival. For doctor visits, patients may have 1 support person age 33 or older with them. For treatment visits, patients can not have anyone with them due to social distancing guidelines and our immunocompromised population.

## 2022-03-16 NOTE — Patient Instructions (Signed)
[facility]  Discharge Instructions: Thank you for choosing [facility] to provide your oncology and hematology care.  If you have a lab appointment with the [facility], please come in thru the Main Entrance and check in at the main information desk.  Wear comfortable clothing and clothing appropriate for easy access to any Portacath or PICC line.   We strive to give you quality time with your provider. You may need to reschedule your appointment if you arrive late (15 or more minutes).  Arriving late affects you and other patients whose appointments are after yours.  Also, if you miss three or more appointments without notifying the office, you may be dismissed from the clinic at the provider's discretion.      For prescription refill requests, have your pharmacy contact our office and allow 72 hours for refills to be completed.    Today you received the following chemotherapy and/or immunotherapy agents Tecentriq/VP16/carboplatin.  Atezolizumab injection What is this medication? ATEZOLIZUMAB (a te zoe LIZ ue mab) is a monoclonal antibody. It is used to treat bladder cancer (urothelial cancer), liver cancer, lung cancer, and melanoma. This medicine may be used for other purposes; ask your health care provider or pharmacist if you have questions. COMMON BRAND NAME(S): Tecentriq What should I tell my care team before I take this medication? They need to know if you have any of these conditions: autoimmune diseases like Crohn's disease, ulcerative colitis, or lupus have had or planning to have an allogeneic stem cell transplant (uses someone else's stem cells) history of organ transplant history of radiation to the chest nervous system problems like myasthenia gravis or Guillain-Barre syndrome an unusual or allergic reaction to atezolizumab, other medicines, foods, dyes, or preservatives pregnant or trying to get pregnant breast-feeding How should I use this  medication? This medicine is for infusion into a vein. It is given by a health care professional in a hospital or clinic setting. A special MedGuide will be given to you before each treatment. Be sure to read this information carefully each time. Talk to your pediatrician regarding the use of this medicine in children. Special care may be needed. Overdosage: If you think you have taken too much of this medicine contact a poison control center or emergency room at once. NOTE: This medicine is only for you. Do not share this medicine with others. What if I miss a dose? It is important not to miss your dose. Call your doctor or health care professional if you are unable to keep an appointment. What may interact with this medication? Interactions have not been studied. This list may not describe all possible interactions. Give your health care provider a list of all the medicines, herbs, non-prescription drugs, or dietary supplements you use. Also tell them if you smoke, drink alcohol, or use illegal drugs. Some items may interact with your medicine. What should I watch for while using this medication? Your condition will be monitored carefully while you are receiving this medicine. You may need blood work done while you are taking this medicine. Do not become pregnant while taking this medicine or for at least 5 months after stopping it. Women should inform their doctor if they wish to become pregnant or think they might be pregnant. There is a potential for serious side effects to an unborn child. Talk to your health care professional or pharmacist for more information. Do not breast-feed an infant while taking this medicine or for at least 5  months after the last dose. What side effects may I notice from receiving this medication? Side effects that you should report to your doctor or health care professional as soon as possible: allergic reactions like skin rash, itching or hives, swelling of the  face, lips, or tongue black, tarry stools bloody or watery diarrhea breathing problems changes in vision chest pain or chest tightness chills facial flushing fever headache signs and symptoms of high blood sugar such as dizziness; dry mouth; dry skin; fruity breath; nausea; stomach pain; increased hunger or thirst; increased urination signs and symptoms of liver injury like dark yellow or brown urine; general ill feeling or flu-like symptoms; light-colored stools; loss of appetite; nausea; right upper belly pain; unusually weak or tired; yellowing of the eyes or skin stomach pain trouble passing urine or change in the amount of urine Side effects that usually do not require medical attention (report to your doctor or health care professional if they continue or are bothersome): bone pain cough diarrhea joint pain muscle pain muscle weakness swelling of arms or legs tiredness weight loss This list may not describe all possible side effects. Call your doctor for medical advice about side effects. You may report side effects to FDA at 1-800-FDA-1088. Where should I keep my medication? This drug is given in a hospital or clinic and will not be stored at home. NOTE: This sheet is a summary. It may not cover all possible information. If you have questions about this medicine, talk to your doctor, pharmacist, or health care provider.  2023 Elsevier/Gold Standard (2021-08-22 00:00:00) Carboplatin injection What is this medication? CARBOPLATIN (KAR boe pla tin) is a chemotherapy drug. It targets fast dividing cells, like cancer cells, and causes these cells to die. This medicine is used to treat ovarian cancer and many other cancers. This medicine may be used for other purposes; ask your health care provider or pharmacist if you have questions. COMMON BRAND NAME(S): Paraplatin What should I tell my care team before I take this medication? They need to know if you have any of these  conditions: blood disorders hearing problems kidney disease recent or ongoing radiation therapy an unusual or allergic reaction to carboplatin, cisplatin, other chemotherapy, other medicines, foods, dyes, or preservatives pregnant or trying to get pregnant breast-feeding How should I use this medication? This drug is usually given as an infusion into a vein. It is administered in a hospital or clinic by a specially trained health care professional. Talk to your pediatrician regarding the use of this medicine in children. Special care may be needed. Overdosage: If you think you have taken too much of this medicine contact a poison control center or emergency room at once. NOTE: This medicine is only for you. Do not share this medicine with others. What if I miss a dose? It is important not to miss a dose. Call your doctor or health care professional if you are unable to keep an appointment. What may interact with this medication? medicines for seizures medicines to increase blood counts like filgrastim, pegfilgrastim, sargramostim some antibiotics like amikacin, gentamicin, neomycin, streptomycin, tobramycin vaccines Talk to your doctor or health care professional before taking any of these medicines: acetaminophen aspirin ibuprofen ketoprofen naproxen This list may not describe all possible interactions. Give your health care provider a list of all the medicines, herbs, non-prescription drugs, or dietary supplements you use. Also tell them if you smoke, drink alcohol, or use illegal drugs. Some items may interact with your medicine. What should  I watch for while using this medication? Your condition will be monitored carefully while you are receiving this medicine. You will need important blood work done while you are taking this medicine. This drug may make you feel generally unwell. This is not uncommon, as chemotherapy can affect healthy cells as well as cancer cells. Report any side  effects. Continue your course of treatment even though you feel ill unless your doctor tells you to stop. In some cases, you may be given additional medicines to help with side effects. Follow all directions for their use. Call your doctor or health care professional for advice if you get a fever, chills or sore throat, or other symptoms of a cold or flu. Do not treat yourself. This drug decreases your body's ability to fight infections. Try to avoid being around people who are sick. This medicine may increase your risk to bruise or bleed. Call your doctor or health care professional if you notice any unusual bleeding. Be careful brushing and flossing your teeth or using a toothpick because you may get an infection or bleed more easily. If you have any dental work done, tell your dentist you are receiving this medicine. Avoid taking products that contain aspirin, acetaminophen, ibuprofen, naproxen, or ketoprofen unless instructed by your doctor. These medicines may hide a fever. Do not become pregnant while taking this medicine. Women should inform their doctor if they wish to become pregnant or think they might be pregnant. There is a potential for serious side effects to an unborn child. Talk to your health care professional or pharmacist for more information. Do not breast-feed an infant while taking this medicine. What side effects may I notice from receiving this medication? Side effects that you should report to your doctor or health care professional as soon as possible: allergic reactions like skin rash, itching or hives, swelling of the face, lips, or tongue signs of infection - fever or chills, cough, sore throat, pain or difficulty passing urine signs of decreased platelets or bleeding - bruising, pinpoint red spots on the skin, black, tarry stools, nosebleeds signs of decreased red blood cells - unusually weak or tired, fainting spells, lightheadedness breathing problems changes in  hearing changes in vision chest pain high blood pressure low blood counts - This drug may decrease the number of white blood cells, red blood cells and platelets. You may be at increased risk for infections and bleeding. nausea and vomiting pain, swelling, redness or irritation at the injection site pain, tingling, numbness in the hands or feet problems with balance, talking, walking trouble passing urine or change in the amount of urine Side effects that usually do not require medical attention (report to your doctor or health care professional if they continue or are bothersome): hair loss loss of appetite metallic taste in the mouth or changes in taste This list may not describe all possible side effects. Call your doctor for medical advice about side effects. You may report side effects to FDA at 1-800-FDA-1088. Where should I keep my medication? This drug is given in a hospital or clinic and will not be stored at home. NOTE: This sheet is a summary. It may not cover all possible information. If you have questions about this medicine, talk to your doctor, pharmacist, or health care provider.  2023 Elsevier/Gold Standard (2008-02-29 00:00:00) Etoposide, VP-16 capsules What is this medication? ETOPOSIDE, VP-16 (e toe POE side) is a chemotherapy drug. It is used to treat small cell lung cancer and other cancers.  This medicine may be used for other purposes; ask your health care provider or pharmacist if you have questions. COMMON BRAND NAME(S): VePesid What should I tell my care team before I take this medication? They need to know if you have any of these conditions: infection kidney disease liver disease low blood counts, like low white cell, platelet, or red cell counts an unusual or allergic reaction to etoposide, other medicines, foods, dyes, or preservatives pregnant or trying to get pregnant breast-feeding How should I use this medication? Take this medicine by mouth with a  glass of water. Follow the directions on the prescription label. Do not open, crush, or chew the capsules. It is advisable to wear gloves when handling this medicine. Take your medicine at regular intervals. Do not take it more often than directed. Do not stop taking except on your doctor's advice. Talk to your pediatrician regarding the use of this medicine in children. Special care may be needed. Overdosage: If you think you have taken too much of this medicine contact a poison control center or emergency room at once. NOTE: This medicine is only for you. Do not share this medicine with others. What if I miss a dose? If you miss a dose, take it as soon as you can. If it is almost time for your next dose, take only that dose. Do not take double or extra doses. What may interact with this medication? This medicine may interact with the following medications: cyclosporine warfarin This list may not describe all possible interactions. Give your health care provider a list of all the medicines, herbs, non-prescription drugs, or dietary supplements you use. Also tell them if you smoke, drink alcohol, or use illegal drugs. Some items may interact with your medicine. What should I watch for while using this medication? Visit your doctor for checks on your progress. This drug may make you feel generally unwell. This is not uncommon, as chemotherapy can affect healthy cells as well as cancer cells. Report any side effects. Continue your course of treatment even though you feel ill unless your doctor tells you to stop. In some cases, you may be given additional medicines to help with side effects. Follow all directions for their use. Call your doctor or health care professional for advice if you get a fever, chills or sore throat, or other symptoms of a cold or flu. Do not treat yourself. This drug decreases your body's ability to fight infections. Try to avoid being around people who are sick. This medicine  may increase your risk to bruise or bleed. Call your doctor or health care professional if you notice any unusual bleeding. Talk to your doctor about your risk of cancer. You may be more at risk for certain types of cancers if you take this medicine. Do not become pregnant while taking this medicine or for at least 6 months after stopping it. Women should inform their doctor if they wish to become pregnant or think they might be pregnant. Women of child-bearing potential will need to have a negative pregnancy test before starting this medicine. There is a potential for serious side effects to an unborn child. Talk to your health care professional or pharmacist for more information. Do not breast-feed an infant while taking this medicine. Men must use a latex condom during sexual contact with a woman while taking this medicine and for at least 4 months after stopping it. A latex condom is needed even if you have had a vasectomy. Contact your  doctor right away if your partner becomes pregnant. Do not donate sperm while taking this medicine and for 4 months after you stop taking this medicine. Men should inform their doctors if they wish to father a child. This medicine may lower sperm counts. What side effects may I notice from receiving this medication? Side effects that you should report to your doctor or health care professional as soon as possible: allergic reactions like skin rash, itching or hives, swelling of the face, lips, or tongue low blood counts - this medicine may decrease the number of white blood cells, red blood cells, and platelets. You may be at increased risk for infections and bleeding nausea, vomiting redness, blistering, peeling or loosening of the skin, including inside the mouth signs and symptoms of infection like fever; chills; cough; sore throat; pain or trouble passing urine signs and symptoms of low red blood cells or anemia such as unusually weak or tired; feeling faint or  lightheaded; falls; breathing problems unusual bruising or bleeding Side effects that usually do not require medical attention (report to your doctor or health care professional if they continue or are bothersome): changes in taste diarrhea hair loss loss of appetite mouth sores This list may not describe all possible side effects. Call your doctor for medical advice about side effects. You may report side effects to FDA at 1-800-FDA-1088. Where should I keep my medication? Keep out of the reach of children. Store in a refrigerator between 2 and 8 degrees C (36 and 46 degrees F). Do not freeze. Throw away any unused medicine after the expiration date. NOTE: This sheet is a summary. It may not cover all possible information. If you have questions about this medicine, talk to your doctor, pharmacist, or health care provider.  2023 Elsevier/Gold Standard (2021-08-22 00:00:00)        To help prevent nausea and vomiting after your treatment, we encourage you to take your nausea medication as directed.  BELOW ARE SYMPTOMS THAT SHOULD BE REPORTED IMMEDIATELY: *FEVER GREATER THAN 100.4 F (38 C) OR HIGHER *CHILLS OR SWEATING *NAUSEA AND VOMITING THAT IS NOT CONTROLLED WITH YOUR NAUSEA MEDICATION *UNUSUAL SHORTNESS OF BREATH *UNUSUAL BRUISING OR BLEEDING *URINARY PROBLEMS (pain or burning when urinating, or frequent urination) *BOWEL PROBLEMS (unusual diarrhea, constipation, pain near the anus) TENDERNESS IN MOUTH AND THROAT WITH OR WITHOUT PRESENCE OF ULCERS (sore throat, sores in mouth, or a toothache) UNUSUAL RASH, SWELLING OR PAIN  UNUSUAL VAGINAL DISCHARGE OR ITCHING   Items with * indicate a potential emergency and should be followed up as soon as possible or go to the Emergency Department if any problems should occur.  Please show the CHEMOTHERAPY ALERT CARD or IMMUNOTHERAPY ALERT CARD at check-in to the Emergency Department and triage nurse.  Should you have questions after your  visit or need to cancel or reschedule your appointment, please contact [facility] [phone]  and follow the prompts.  Office hours are 8:00 a.m. to 4:30 p.m. Monday - Friday. Please note that voicemails left after 4:00 p.m. may not be returned until the following business day.  We are closed weekends and major holidays. You have access to a nurse at all times for urgent questions. Please call the main number to the clinic [phone] and follow the prompts.  For any non-urgent questions, you may also contact your provider using MyChart. We now offer e-Visits for anyone 59 and older to request care online for non-urgent symptoms. For details visit [email or web address].   Also  download the MyChart app! Go to the app store, search "MyChart", open the app, select [facility], and log in with your MyChart username and password.  Due to Covid, a mask is required upon entering the hospital/clinic. If you do not have a mask, one will be given to you upon arrival. For doctor visits, patients may have 1 support person aged 71 or older with them. For treatment visits, patients cannot have anyone with them due to current Covid guidelines and our immunocompromised population.

## 2022-03-16 NOTE — Progress Notes (Signed)
[facility] [address], [place] [address]   CLINIC:  Medical Oncology/Hematology  PCP:  Elfredia Nevins, MD [address] / [place] [address] [phone]   REASON FOR VISIT:  Follow-up for right lung mass and mediastinal lymphadenopathy  PRIOR THERAPY: none  NGS Results: not done  CURRENT THERAPY: Carboplatin + Etoposide + Atezolizumab Induction q21d / Atezolizumab Maintenance q21d  BRIEF ONCOLOGIC HISTORY:  Oncology History  Small cell lung Moore, right upper lobe (HCC)  01/22/2022 Initial Diagnosis   Small cell lung Moore, right upper lobe (HCC)   01/22/2022 Moore Staging   Staging form: Lung, AJCC 8th Edition - Clinical stage from 01/22/2022: Stage IVB (cT4, cN2, pM1c) - Signed by Doreatha Massed, MD on 02/16/2022 Histopathologic type: Small cell carcinoma, NOS Stage prefix: Initial diagnosis   02/23/2022 -  Chemotherapy   Patient is on Treatment Plan : LUNG SCLC Carboplatin + Etoposide + Atezolizumab Induction q21d / Atezolizumab Maintenance q21d       Moore STAGING:  Moore Staging  Small cell lung Moore, right upper lobe (HCC) Staging form: Lung, AJCC 8th Edition - Clinical stage from 01/22/2022: Stage IVB (cT4, cN2, pM1c) - Signed by Doreatha Massed, MD on 02/16/2022   INTERVAL HISTORY:  Larry Moore, a 71 y.o. male, returns for routine follow-up and consideration for next cycle of chemotherapy. Larry Moore was last seen on 01/23/2022.  Due for cycle #2 of Carboplatin, atezolizumab, and etoposide today.   Overall, he tells me he has been feeling pretty well. He reports pain with swallowing and fatigue. He is unable to swallow potassium tablets. He reports tinnitus in his left ear starting today. Carafate provides pain relief when swallowing for about 30 minutes, but once it wears off the pain has worsened in his throat. He denies numbness/tingling and n/v/d. His eating overall has slightly improved. He reports  his energy levels are 25% with respect to his baseline, and he is using a walker to walk at home.    Overall, he feels ready for next cycle of chemo today.    REVIEW OF SYSTEMS:  Review of Systems  Constitutional:  Positive for fatigue. Negative for appetite change.  HENT:   Positive for tinnitus and trouble swallowing (6/10).   Respiratory:  Positive for cough.   Gastrointestinal:  Negative for diarrhea, nausea and vomiting.  Neurological:  Negative for numbness.  Psychiatric/Behavioral:  Positive for depression and sleep disturbance. The patient is nervous/anxious.   All other systems reviewed and are negative.   PAST MEDICAL/SURGICAL HISTORY:  Past Medical History:  Diagnosis Date   Dyspnea    Dysrhythmia    hx a-fib   Hypertension    Insomnia    Pre-diabetes    Sleep apnea    Past Surgical History:  Procedure Laterality Date   ATRIAL FIBRILLATION ABLATION     approx 2020   CARDIAC CATHETERIZATION     HERNIA REPAIR     IR IMAGING GUIDED PORT INSERTION  02/17/2022   IR VENO/JUGULAR RIGHT  02/17/2022   VIDEO BRONCHOSCOPY WITH ENDOBRONCHIAL ULTRASOUND Bilateral 01/29/2022   Procedure: VIDEO BRONCHOSCOPY WITH ENDOBRONCHIAL ULTRASOUND;  Surgeon: Josephine Igo, DO;  Location: [facility] OR;  Service: Cardiopulmonary;  Laterality: Bilateral;  w/ Guardant [id number]    SOCIAL HISTORY:  Social History   Socioeconomic History   Marital status: Married    Spouse name: Not on file   Number of children: Not on file   Years of education: Not on file  Highest education level: Not on file  Occupational History   Occupation: Retired  Tobacco Use   Smoking status: Every Day    Types: Cigarettes    Passive exposure: Current   Smokeless tobacco: Not on file   Tobacco comments:    Pt smokes 1 ppd. 01/26/22  Vaping Use   Vaping Use: Never used  Substance and Sexual Activity   Alcohol use: Not Currently    Comment: 1 bottle wine a day    Drug use: No   Sexual activity: Not on file   Other Topics Concern   Not on file  Social History Narrative   Not on file   Social Determinants of Health   Financial Resource Strain: Not on file  Food Insecurity: Not on file  Transportation Needs: Not on file  Physical Activity: Not on file  Stress: Not on file  Social Connections: Not on file  Intimate Partner Violence: Not on file    FAMILY HISTORY:  Family History  Problem Relation Age of Onset   Coronary artery disease Father    Diabetes Father     CURRENT MEDICATIONS:  Current Outpatient Medications  Medication Sig Dispense Refill   ALPRAZolam (XANAX) 0.5 MG tablet Take 1 tablet (0.5 mg total) by mouth 2 (two) times daily as needed for anxiety. 30 tablet 0   Atezolizumab (TECENTRIQ IV) Inject into the vein every 21 ( twenty-one) days.     baclofen (LIORESAL) 10 MG tablet Take 1-2 tablets (10-20 mg total) by mouth 3 (three) times daily. 180 each 0   CARBOPLATIN IV Inject into the vein every 21 ( twenty-one) days.     ETOPOSIDE IV Inject into the vein every 21 ( twenty-one) days. Days 1-3 every 21 days     furosemide (LASIX) 80 MG tablet Take 80 mg by mouth daily.     lidocaine (XYLOCAINE) 2 % solution Use as directed 15 mLs in the mouth or throat as needed for mouth pain (prior to meals/supplements to decrease pain with swallowing). Mix 1:1 with Maalox 100 mL 6   lidocaine-prilocaine (EMLA) cream Apply topically as directed.     metolazone (ZAROXOLYN) 2.5 MG tablet Take 2.5 mg by mouth daily.     ondansetron (ZOFRAN) 8 MG tablet Take 1 tablet (8 mg total) by mouth every 8 (eight) hours as needed for refractory nausea / vomiting. Start on day 3 after carboplatin chemo. 30 tablet 1   prochlorperazine (COMPAZINE) 10 MG tablet Take 1 tablet (10 mg total) by mouth every 6 (six) hours as needed (Nausea or vomiting). 60 tablet 3   sotalol (BETAPACE) 80 MG tablet SMARTSIG:1 Tablet(s) By Mouth Every 12 Hours     sucralfate (CARAFATE) 1 GM/10ML suspension TAKE 10MLS BY MOUTH 4  TIMES DAILY AS NEEDED. 420 mL 0   Vitamin D, Ergocalciferol, (DRISDOL) 1.25 MG (50000 UNIT) CAPS capsule Take 50,000 Units by mouth daily.     No current facility-administered medications for this visit.    ALLERGIES:  No Known Allergies  PHYSICAL EXAM:  Performance status (ECOG): 1 - Symptomatic but completely ambulatory  There were no vitals filed for this visit. Wt Readings from Last 3 Encounters:  02/23/22 241 lb 9.6 oz (109.6 kg)  02/17/22 235 lb (106.6 kg)  02/16/22 235 lb (106.6 kg)   Physical Exam Vitals reviewed.  Constitutional:      Appearance: Normal appearance. He is obese.  Cardiovascular:     Rate and Rhythm: Normal rate and regular rhythm.  Pulses: Normal pulses.     Heart sounds: Normal heart sounds.  Pulmonary:     Effort: Pulmonary effort is normal.     Breath sounds: Normal breath sounds.  Abdominal:     Palpations: Abdomen is soft. There is no mass.     Tenderness: There is no abdominal tenderness.  Neurological:     General: No focal deficit present.     Mental Status: He is alert and oriented to person, place, and time.  Psychiatric:        Mood and Affect: Mood normal.        Behavior: Behavior normal.     LABORATORY DATA:  I have reviewed the labs as listed.     Latest Ref Rng & Units 03/05/2022    9:10 AM 02/23/2022    9:25 AM 02/06/2022   11:09 AM  CBC  WBC 4.0 - 10.5 K/uL 0.9  7.6  9.3   Hemoglobin 13.0 - 17.0 g/dL 13.8  15.5  15.7   Hematocrit 39.0 - 52.0 % 40.1  46.2  47.3   Platelets 150 - 400 K/uL 47  201  280.0       Latest Ref Rng & Units 03/05/2022    9:10 AM 02/23/2022    9:25 AM 01/29/2022   10:11 AM  CMP  Glucose 70 - 99 mg/dL 102  176  102   BUN 8 - 23 mg/dL $Remove'11  13  10   'dojuROj$ Creatinine 0.61 - 1.24 mg/dL 0.72  0.91  0.80   Sodium 135 - 145 mmol/L 133  136  137   Potassium 3.5 - 5.1 mmol/L 2.9  2.4  3.0   Chloride 98 - 111 mmol/L 95  97  99   CO2 22 - 32 mmol/L $RemoveB'31  31  29   'ZghhERPi$ Calcium 8.9 - 10.3 mg/dL 8.1  8.1  8.3   Total  Protein 6.5 - 8.1 g/dL 6.4  6.6    Total Bilirubin 0.3 - 1.2 mg/dL 1.1  0.9    Alkaline Phos 38 - 126 U/L 66  71    AST 15 - 41 U/L 10  15    ALT 0 - 44 U/L 11  10      DIAGNOSTIC IMAGING:  I have independently reviewed the scans and discussed with the patient. IR IMAGING GUIDED PORT INSERTION  Result Date: 02/17/2022 INDICATION: 71 year old male with history of right hilar small cell lung Moore presenting for Port-A-Cath placement for chemotherapy administration. EXAM: IMPLANTED PORT A CATH PLACEMENT WITH ULTRASOUND AND FLUOROSCOPIC GUIDANCE COMPARISON:  None Available. MEDICATIONS: None. ANESTHESIA/SEDATION: Moderate (conscious) sedation was employed during this procedure. A total of Versed 4 mg and Fentanyl 200 mcg was administered intravenously. Moderate Sedation Time: 46 minutes. The patient's level of consciousness and vital signs were monitored continuously by radiology nursing throughout the procedure under my direct supervision. CONTRAST:  None FLUOROSCOPY TIME:  One minutes, 54 seconds (96 mGy) COMPLICATIONS: None immediate. PROCEDURE: The procedure, risks, benefits, and alternatives were explained to the patient. Questions regarding the procedure were encouraged and answered. The patient understands and consents to the procedure. The right neck and chest were prepped with chlorhexidine in a sterile fashion, and a sterile drape was applied covering the operative field. Maximum barrier sterile technique with sterile gowns and gloves were used for the procedure. A timeout was performed prior to the initiation of the procedure. Ultrasound was used to examine the jugular vein which was unable to be identified. A right external jugular  vein was present which was visualized diving centrally. A skin marker was used to demarcate the planned venotomy site. Local anesthesia was provided with 1% lidocaine with 1:100,000 epinephrine. Under ultrasound guidance, the external jugular vein was accessed with a  21 ga micropuncture needle and an 0.018" wire was inserted but met resistance. The 3 [place] inner portion of the micropuncture kit was inserted in venogram was performed. Venogram demonstrated multiple anterior chest wall collateral veins without identifiable right internal jugular or innominate vein. Therefore, the left neck was prepped and draped in standard fashion. Ultrasound was used to examine the jugular vein which was compressible and free of internal echoes. Skin marker was used to demarcate the planned venotomy site and port pocket incision. Subdermal Local anesthesia was provided at both sites as well as the subcutaneous tunnel tract with 1% lidocaine with 1:100,000 epinephrine. A small skin nick was made at the planned venotomy site. Real-time ultrasound guidance was utilized for vascular access including the acquisition of a permanent ultrasound image documenting patency of the accessed vessel. A 5 Fr micopuncture set was then used, through which a 0.035" Rosen wire was passed under fluoroscopic guidance into the inferior vena cava. An 8 Fr dilator was then placed over the wire. A subcutaneous port pocket was then created along the upper chest wall utilizing a combination of sharp and blunt dissection. The pocket was irrigated with sterile saline, packed with gauze, and observed for hemorrhage. A single lumen "ISP" sized power injectable port was chosen for placement. The 8 Fr catheter was tunneled from the port pocket site to the venotomy incision. The port was placed in the pocket. The external catheter was trimmed to appropriate length. The dilator was exchanged for an 8 Fr peel-away sheath under fluoroscopic guidance. The catheter was then placed through the sheath and the sheath was removed. Final catheter positioning was confirmed and documented with a fluoroscopic spot radiograph. The port was accessed with a Huber needle, aspirated, and flushed with heparinized saline. The deep dermal layer of  the port pocket incision was closed with interrupted 3-0 Vicryl suture. Dermabond was then placed over the port pocket and neck incisions. The patient tolerated the procedure well without immediate post procedural complication. FINDINGS: Occluded/absent right internal jugular vein. After catheter placement, the tip lies within the superior cavoatrial junction. The catheter aspirates and flushes normally and is ready for immediate use. IMPRESSION: 1. Successful placement of a power injectable Port-A-Cath via the left internal jugular vein. The catheter is ready for immediate use. 2. Occluded/absent right internal jugular vein. Larry Cancer, MD [facility] Radiology Electronically Signed   By: Larry Moore M.D.   On: 02/17/2022 15:45   IR Veno/Jugular Right  Result Date: 02/17/2022 INDICATION: 71 year old male with history of right hilar small cell lung Moore presenting for Port-A-Cath placement for chemotherapy administration. EXAM: IMPLANTED PORT A CATH PLACEMENT WITH ULTRASOUND AND FLUOROSCOPIC GUIDANCE COMPARISON:  None Available. MEDICATIONS: None. ANESTHESIA/SEDATION: Moderate (conscious) sedation was employed during this procedure. A total of Versed 4 mg and Fentanyl 200 mcg was administered intravenously. Moderate Sedation Time: 46 minutes. The patient's level of consciousness and vital signs were monitored continuously by radiology nursing throughout the procedure under my direct supervision. CONTRAST:  None FLUOROSCOPY TIME:  One minutes, 54 seconds (96 mGy) COMPLICATIONS: None immediate. PROCEDURE: The procedure, risks, benefits, and alternatives were explained to the patient. Questions regarding the procedure were encouraged and answered. The patient understands and consents to the procedure. The right  neck and chest were prepped with chlorhexidine in a sterile fashion, and a sterile drape was applied covering the operative field. Maximum barrier  sterile technique with sterile gowns and gloves were used for the procedure. A timeout was performed prior to the initiation of the procedure. Ultrasound was used to examine the jugular vein which was unable to be identified. A right external jugular vein was present which was visualized diving centrally. A skin marker was used to demarcate the planned venotomy site. Local anesthesia was provided with 1% lidocaine with 1:100,000 epinephrine. Under ultrasound guidance, the external jugular vein was accessed with a 21 ga micropuncture needle and an 0.018" wire was inserted but met resistance. The 3 [place] inner portion of the micropuncture kit was inserted in venogram was performed. Venogram demonstrated multiple anterior chest wall collateral veins without identifiable right internal jugular or innominate vein. Therefore, the left neck was prepped and draped in standard fashion. Ultrasound was used to examine the jugular vein which was compressible and free of internal echoes. Skin marker was used to demarcate the planned venotomy site and port pocket incision. Subdermal Local anesthesia was provided at both sites as well as the subcutaneous tunnel tract with 1% lidocaine with 1:100,000 epinephrine. A small skin nick was made at the planned venotomy site. Real-time ultrasound guidance was utilized for vascular access including the acquisition of a permanent ultrasound image documenting patency of the accessed vessel. A 5 Fr micopuncture set was then used, through which a 0.035" Rosen wire was passed under fluoroscopic guidance into the inferior vena cava. An 8 Fr dilator was then placed over the wire. A subcutaneous port pocket was then created along the upper chest wall utilizing a combination of sharp and blunt dissection. The pocket was irrigated with sterile saline, packed with gauze, and observed for hemorrhage. A single lumen "ISP" sized power injectable port was chosen for placement. The 8 Fr catheter was  tunneled from the port pocket site to the venotomy incision. The port was placed in the pocket. The external catheter was trimmed to appropriate length. The dilator was exchanged for an 8 Fr peel-away sheath under fluoroscopic guidance. The catheter was then placed through the sheath and the sheath was removed. Final catheter positioning was confirmed and documented with a fluoroscopic spot radiograph. The port was accessed with a Huber needle, aspirated, and flushed with heparinized saline. The deep dermal layer of the port pocket incision was closed with interrupted 3-0 Vicryl suture. Dermabond was then placed over the port pocket and neck incisions. The patient tolerated the procedure well without immediate post procedural complication. FINDINGS: Occluded/absent right internal jugular vein. After catheter placement, the tip lies within the superior cavoatrial junction. The catheter aspirates and flushes normally and is ready for immediate use. IMPRESSION: 1. Successful placement of a power injectable Port-A-Cath via the left internal jugular vein. The catheter is ready for immediate use. 2. Occluded/absent right internal jugular vein. Larry Cancer, MD [facility] Radiology Electronically Signed   By: Larry Moore M.D.   On: 02/17/2022 15:45     ASSESSMENT:  Extensive stage small cell lung Moore: - Productive cough for 3 weeks with hemoptysis.  Hemoptysis improved after Eliquis stopped 2 weeks ago.  Still has some purulent sputum.  Currently on Levaquin. - Abnormal chest x-ray on 01/19/2022 for productive cough - CT chest with contrast (01/21/2022): Right hilar mass measuring 6.5 x 5.2 cm surrounding and compressing the right upper lobe bronchus, bronchus intermedius, right middle  lobe bronchus and right lower lobe bronchus centrally.  Compression of the right lower lobe pulmonary artery.  Nodular soft tissue lesion in the right lower lobe continuous with  right hilar mass and could represent endobronchial spread.  Enlarged mediastinal lymph nodes but no findings of pulmonary metastatic disease.  Indeterminate bilateral adrenal gland nodules and low attenuation liver lesions, likely benign cyst. - No weight loss.  No chest pains. - PET scan: Right hilar mass involving upper lobe, middle lobe and lower lobe with satellite nodularity in the right lower lobe, hypermetabolic subcarinal and right paratracheal lymphadenopathy.  Pleural-based nodule along the right posterior costophrenic angle.  Hypermetabolic left adrenal lesion.  Small hypermetabolic lesions in the humerus bilaterally and a subcutaneous focus in the right posterior lower back. - Right lung hilar mass biopsy (01/29/2022): Small cell carcinoma - Cycle 1 of carboplatin, etoposide and Atezolizumab on 02/23/2022    Social/family history: - He lives at home with his wife Larry Moore.  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  Retired 8 years ago.  Smoked 1 pack/day for 50 years. - Father had renal cell carcinoma.   PLAN:  Extensive stage small cell lung Moore: - He has developed pain in the retrosternal region on swallowing after first cycle.  Also reported severe fatigue, partly due to decreased eating. - He reported ringing in the left ear which started today. - Reviewed his labs today which showed normal LFTs but decreased albumin 3.3.  CBC was grossly normal. - Due to poor tolerance, I would cut back on the dose of carboplatin to AUC 4 and VP-16 to 80 mg per metered square. - RTC 3 weeks for follow-up for cycle 3.  Plan to repeat scans after cycle 3.  2.  Esophagitis: - He has developed pain in the retrosternal region when he swallows the week after radiation therapy.  Weight has been stable. - Continue to use Carafate with or without lidocaine and Maalox as needed.  This is slowly improving.  3.  Hypokalemia: - He has severe hypokalemia with potassium 2.6 today. - He is unable  to swallow potassium tablets.  He cannot tolerate liquid potassium. - He will receive IV potassium today.  He will start back oral potassium tablets as soon as his swallowing improves. - He is taking Lasix 80 mg once a week on average with metolazone.   Orders placed this encounter:  No orders of the defined types were placed in this encounter.    Larry Jack, MD [place] [phone]   I, Larry Moore, am acting as a scribe for Dr. Derek Moore.  I, Larry Jack MD, have reviewed the above documentation for accuracy and completeness, and I agree with the above.

## 2022-03-16 NOTE — Progress Notes (Signed)
Patient presents today for treatment and follow up visit with Dr. Delton Coombes. Vital signs within parameters for today's treatment. Potassium 2.6. Reported potassium level to patient and patient states he can't take the pills due to pain with swallowing and agreed to IV potassium. Patient's appetite is strong and he wants to eat but he can't per patient's words.    Message received from A. Ouida Sills RN / Dr. Delton Coombes to proceed with treatment. Give 20 of Potassium IV  with treatment. Carboplatin AUC decreased to 4. Vp-16 dose reduced.   Treatment given today per MD orders. Tolerated infusion without adverse affects. Vital signs stable. No complaints at this time. Discharged from clinic by wheel chair in stable condition. Alert and oriented x 3. F/U with [facility] as scheduled.

## 2022-03-17 ENCOUNTER — Encounter (HOSPITAL_COMMUNITY): Payer: Self-pay

## 2022-03-17 ENCOUNTER — Ambulatory Visit: Payer: PPO

## 2022-03-17 ENCOUNTER — Inpatient Hospital Stay (HOSPITAL_COMMUNITY): Payer: PPO

## 2022-03-17 VITALS — BP 114/75 | HR 78 | Temp 96.2°F | Resp 20

## 2022-03-17 DIAGNOSIS — C3411 Malignant neoplasm of upper lobe, right bronchus or lung: Secondary | ICD-10-CM

## 2022-03-17 DIAGNOSIS — Z5111 Encounter for antineoplastic chemotherapy: Secondary | ICD-10-CM | POA: Diagnosis not present

## 2022-03-17 DIAGNOSIS — E876 Hypokalemia: Secondary | ICD-10-CM

## 2022-03-17 MED ORDER — SODIUM CHLORIDE 0.9 % IV SOLN: Freq: Once | INTRAVENOUS | Status: AC

## 2022-03-17 MED ORDER — SODIUM CHLORIDE 0.9% FLUSH
10.0000 mL | INTRAVENOUS | Status: DC | PRN
  Administered 2022-03-17: 10 mL

## 2022-03-17 MED ORDER — HEPARIN SOD (PORK) LOCK FLUSH 100 UNIT/ML IV SOLN
500.0000 [IU] | Freq: Once | INTRAVENOUS | Status: AC | PRN
  Administered 2022-03-17: 500 [IU]

## 2022-03-17 MED ORDER — SODIUM CHLORIDE 0.9 % IV SOLN
80.0000 mg/m2 | Freq: Once | INTRAVENOUS | Status: AC
  Administered 2022-03-17: 180 mg via INTRAVENOUS
  Filled 2022-03-17: qty 9

## 2022-03-17 MED ORDER — POTASSIUM CHLORIDE 10 MEQ/100ML IV SOLN
10.0000 meq | INTRAVENOUS | Status: AC
  Administered 2022-03-17 (×2): 10 meq via INTRAVENOUS
  Filled 2022-03-17 (×2): qty 100

## 2022-03-17 MED ORDER — SODIUM CHLORIDE 0.9 % IV SOLN
10.0000 mg | Freq: Once | INTRAVENOUS | Status: AC
  Administered 2022-03-17: 10 mg via INTRAVENOUS
  Filled 2022-03-17: qty 10

## 2022-03-17 MED FILL — Etoposide Inj 1 GM/50ML (20 MG/ML): INTRAVENOUS | Qty: 9 | Status: AC

## 2022-03-17 NOTE — Progress Notes (Signed)
Patient presents today for treatment. Vital signs within parameters for treatment. Patient denies any changes since his last treatment. Patient states his pain in his throat is better today and patient was able to eat a hamburger yesterday. MAR reviewed.   Treatment given today per MD orders. Tolerated infusion without adverse affects. Vital signs stable. No complaints at this time. Discharged from clinic by wheel chair in stable condition. Alert and oriented x 3. F/U with [facility] as scheduled.

## 2022-03-17 NOTE — Patient Instructions (Signed)
[facility]  Discharge Instructions: Thank you for choosing [facility] to provide your oncology and hematology care.  If you have a lab appointment with the [facility], please come in thru the Main Entrance and check in at the main information desk.  Wear comfortable clothing and clothing appropriate for easy access to any Portacath or PICC line.   We strive to give you quality time with your provider. You may need to reschedule your appointment if you arrive late (15 or more minutes).  Arriving late affects you and other patients whose appointments are after yours.  Also, if you miss three or more appointments without notifying the office, you may be dismissed from the clinic at the provider's discretion.      For prescription refill requests, have your pharmacy contact our office and allow 72 hours for refills to be completed.    Today you received the following chemotherapy and/or immunotherapy agents Etoposide .  Etoposide, VP-16 capsules What is this medication? ETOPOSIDE, VP-16 (e toe POE side) is a chemotherapy drug. It is used to treat small cell lung cancer and other cancers. This medicine may be used for other purposes; ask your health care provider or pharmacist if you have questions. COMMON BRAND NAME(S): VePesid What should I tell my care team before I take this medication? They need to know if you have any of these conditions: infection kidney disease liver disease low blood counts, like low white cell, platelet, or red cell counts an unusual or allergic reaction to etoposide, other medicines, foods, dyes, or preservatives pregnant or trying to get pregnant breast-feeding How should I use this medication? Take this medicine by mouth with a glass of water. Follow the directions on the prescription label. Do not open, crush, or chew the capsules. It is advisable to wear gloves when handling this medicine. Take your medicine at regular intervals. Do  not take it more often than directed. Do not stop taking except on your doctor's advice. Talk to your pediatrician regarding the use of this medicine in children. Special care may be needed. Overdosage: If you think you have taken too much of this medicine contact a poison control center or emergency room at once. NOTE: This medicine is only for you. Do not share this medicine with others. What if I miss a dose? If you miss a dose, take it as soon as you can. If it is almost time for your next dose, take only that dose. Do not take double or extra doses. What may interact with this medication? This medicine may interact with the following medications: cyclosporine warfarin This list may not describe all possible interactions. Give your health care provider a list of all the medicines, herbs, non-prescription drugs, or dietary supplements you use. Also tell them if you smoke, drink alcohol, or use illegal drugs. Some items may interact with your medicine. What should I watch for while using this medication? Visit your doctor for checks on your progress. This drug may make you feel generally unwell. This is not uncommon, as chemotherapy can affect healthy cells as well as cancer cells. Report any side effects. Continue your course of treatment even though you feel ill unless your doctor tells you to stop. In some cases, you may be given additional medicines to help with side effects. Follow all directions for their use. Call your doctor or health care professional for advice if you get a fever, chills or sore throat, or other symptoms of  a cold or flu. Do not treat yourself. This drug decreases your body's ability to fight infections. Try to avoid being around people who are sick. This medicine may increase your risk to bruise or bleed. Call your doctor or health care professional if you notice any unusual bleeding. Talk to your doctor about your risk of cancer. You may be more at risk for certain  types of cancers if you take this medicine. Do not become pregnant while taking this medicine or for at least 6 months after stopping it. Women should inform their doctor if they wish to become pregnant or think they might be pregnant. Women of child-bearing potential will need to have a negative pregnancy test before starting this medicine. There is a potential for serious side effects to an unborn child. Talk to your health care professional or pharmacist for more information. Do not breast-feed an infant while taking this medicine. Men must use a latex condom during sexual contact with a woman while taking this medicine and for at least 4 months after stopping it. A latex condom is needed even if you have had a vasectomy. Contact your doctor right away if your partner becomes pregnant. Do not donate sperm while taking this medicine and for 4 months after you stop taking this medicine. Men should inform their doctors if they wish to father a child. This medicine may lower sperm counts. What side effects may I notice from receiving this medication? Side effects that you should report to your doctor or health care professional as soon as possible: allergic reactions like skin rash, itching or hives, swelling of the face, lips, or tongue low blood counts - this medicine may decrease the number of white blood cells, red blood cells, and platelets. You may be at increased risk for infections and bleeding nausea, vomiting redness, blistering, peeling or loosening of the skin, including inside the mouth signs and symptoms of infection like fever; chills; cough; sore throat; pain or trouble passing urine signs and symptoms of low red blood cells or anemia such as unusually weak or tired; feeling faint or lightheaded; falls; breathing problems unusual bruising or bleeding Side effects that usually do not require medical attention (report to your doctor or health care professional if they continue or are  bothersome): changes in taste diarrhea hair loss loss of appetite mouth sores This list may not describe all possible side effects. Call your doctor for medical advice about side effects. You may report side effects to FDA at 1-800-FDA-1088. Where should I keep my medication? Keep out of the reach of children. Store in a refrigerator between 2 and 8 degrees C (36 and 46 degrees F). Do not freeze. Throw away any unused medicine after the expiration date. NOTE: This sheet is a summary. It may not cover all possible information. If you have questions about this medicine, talk to your doctor, pharmacist, or health care provider.  2023 Elsevier/Gold Standard (2021-08-22 00:00:00)       To help prevent nausea and vomiting after your treatment, we encourage you to take your nausea medication as directed.  BELOW ARE SYMPTOMS THAT SHOULD BE REPORTED IMMEDIATELY: *FEVER GREATER THAN 100.4 F (38 C) OR HIGHER *CHILLS OR SWEATING *NAUSEA AND VOMITING THAT IS NOT CONTROLLED WITH YOUR NAUSEA MEDICATION *UNUSUAL SHORTNESS OF BREATH *UNUSUAL BRUISING OR BLEEDING *URINARY PROBLEMS (pain or burning when urinating, or frequent urination) *BOWEL PROBLEMS (unusual diarrhea, constipation, pain near the anus) TENDERNESS IN MOUTH AND THROAT WITH OR WITHOUT PRESENCE OF ULCERS (  sore throat, sores in mouth, or a toothache) UNUSUAL RASH, SWELLING OR PAIN  UNUSUAL VAGINAL DISCHARGE OR ITCHING   Items with * indicate a potential emergency and should be followed up as soon as possible or go to the Emergency Department if any problems should occur.  Please show the CHEMOTHERAPY ALERT CARD or IMMUNOTHERAPY ALERT CARD at check-in to the Emergency Department and triage nurse.  Should you have questions after your visit or need to cancel or reschedule your appointment, please contact [facility] [phone]  and follow the prompts.  Office hours are 8:00 a.m. to 4:30 p.m. Monday - Friday. Please note that  voicemails left after 4:00 p.m. may not be returned until the following business day.  We are closed weekends and major holidays. You have access to a nurse at all times for urgent questions. Please call the main number to the clinic [phone] and follow the prompts.  For any non-urgent questions, you may also contact your provider using MyChart. We now offer e-Visits for anyone 56 and older to request care online for non-urgent symptoms. For details visit [email or web address].   Also download the MyChart app! Go to the app store, search "MyChart", open the app, select [facility], and log in with your MyChart username and password.  Masks are optional in the cancer centers. If you would like for your care team to wear a mask while they are taking care of you, please let them know. For doctor visits, patients may have with them one support person who is at least 71 years old. At this time, visitors are not allowed in the infusion area.

## 2022-03-18 ENCOUNTER — Inpatient Hospital Stay (HOSPITAL_COMMUNITY): Payer: PPO

## 2022-03-18 ENCOUNTER — Encounter (HOSPITAL_COMMUNITY): Payer: Self-pay

## 2022-03-18 ENCOUNTER — Ambulatory Visit: Payer: PPO

## 2022-03-18 VITALS — BP 124/75 | HR 71 | Temp 97.2°F | Resp 18

## 2022-03-18 DIAGNOSIS — C3411 Malignant neoplasm of upper lobe, right bronchus or lung: Secondary | ICD-10-CM

## 2022-03-18 DIAGNOSIS — Z5111 Encounter for antineoplastic chemotherapy: Secondary | ICD-10-CM | POA: Diagnosis not present

## 2022-03-18 MED ORDER — HEPARIN SOD (PORK) LOCK FLUSH 100 UNIT/ML IV SOLN
500.0000 [IU] | Freq: Once | INTRAVENOUS | Status: AC | PRN
  Administered 2022-03-18: 500 [IU]

## 2022-03-18 MED ORDER — SODIUM CHLORIDE 0.9 % IV SOLN: Freq: Once | INTRAVENOUS | Status: AC

## 2022-03-18 MED ORDER — SODIUM CHLORIDE 0.9 % IV SOLN
80.0000 mg/m2 | Freq: Once | INTRAVENOUS | Status: AC
  Administered 2022-03-18: 180 mg via INTRAVENOUS
  Filled 2022-03-18: qty 9

## 2022-03-18 MED ORDER — SODIUM CHLORIDE 0.9 % IV SOLN
10.0000 mg | Freq: Once | INTRAVENOUS | Status: AC
  Administered 2022-03-18: 10 mg via INTRAVENOUS
  Filled 2022-03-18: qty 10

## 2022-03-18 MED ORDER — SODIUM CHLORIDE 0.9% FLUSH
10.0000 mL | INTRAVENOUS | Status: DC | PRN
  Administered 2022-03-18: 10 mL

## 2022-03-18 NOTE — Patient Instructions (Signed)
[facility]  Discharge Instructions: Thank you for choosing [facility] to provide your oncology and hematology care.  If you have a lab appointment with the [facility], please come in thru the Main Entrance and check in at the main information desk.  Wear comfortable clothing and clothing appropriate for easy access to any Portacath or PICC line.   We strive to give you quality time with your provider. You may need to reschedule your appointment if you arrive late (15 or more minutes).  Arriving late affects you and other patients whose appointments are after yours.  Also, if you miss three or more appointments without notifying the office, you may be dismissed from the clinic at the provider's discretion.      For prescription refill requests, have your pharmacy contact our office and allow 72 hours for refills to be completed.    Today you received the following chemotherapy and/or immunotherapy agents Etoposide, return as scheduled.  Etoposide, VP-16 capsules What is this medication? ETOPOSIDE, VP-16 (e toe POE side) is a chemotherapy drug. It is used to treat small cell lung cancer and other cancers. This medicine may be used for other purposes; ask your health care provider or pharmacist if you have questions. COMMON BRAND NAME(S): VePesid What should I tell my care team before I take this medication? They need to know if you have any of these conditions: infection kidney disease liver disease low blood counts, like low white cell, platelet, or red cell counts an unusual or allergic reaction to etoposide, other medicines, foods, dyes, or preservatives pregnant or trying to get pregnant breast-feeding How should I use this medication? Take this medicine by mouth with a glass of water. Follow the directions on the prescription label. Do not open, crush, or chew the capsules. It is advisable to wear gloves when handling this medicine. Take your medicine at  regular intervals. Do not take it more often than directed. Do not stop taking except on your doctor's advice. Talk to your pediatrician regarding the use of this medicine in children. Special care may be needed. Overdosage: If you think you have taken too much of this medicine contact a poison control center or emergency room at once. NOTE: This medicine is only for you. Do not share this medicine with others. What if I miss a dose? If you miss a dose, take it as soon as you can. If it is almost time for your next dose, take only that dose. Do not take double or extra doses. What may interact with this medication? This medicine may interact with the following medications: cyclosporine warfarin This list may not describe all possible interactions. Give your health care provider a list of all the medicines, herbs, non-prescription drugs, or dietary supplements you use. Also tell them if you smoke, drink alcohol, or use illegal drugs. Some items may interact with your medicine. What should I watch for while using this medication? Visit your doctor for checks on your progress. This drug may make you feel generally unwell. This is not uncommon, as chemotherapy can affect healthy cells as well as cancer cells. Report any side effects. Continue your course of treatment even though you feel ill unless your doctor tells you to stop. In some cases, you may be given additional medicines to help with side effects. Follow all directions for their use. Call your doctor or health care professional for advice if you get a fever, chills or sore throat, or other  symptoms of a cold or flu. Do not treat yourself. This drug decreases your body's ability to fight infections. Try to avoid being around people who are sick. This medicine may increase your risk to bruise or bleed. Call your doctor or health care professional if you notice any unusual bleeding. Talk to your doctor about your risk of cancer. You may be more at  risk for certain types of cancers if you take this medicine. Do not become pregnant while taking this medicine or for at least 6 months after stopping it. Women should inform their doctor if they wish to become pregnant or think they might be pregnant. Women of child-bearing potential will need to have a negative pregnancy test before starting this medicine. There is a potential for serious side effects to an unborn child. Talk to your health care professional or pharmacist for more information. Do not breast-feed an infant while taking this medicine. Men must use a latex condom during sexual contact with a woman while taking this medicine and for at least 4 months after stopping it. A latex condom is needed even if you have had a vasectomy. Contact your doctor right away if your partner becomes pregnant. Do not donate sperm while taking this medicine and for 4 months after you stop taking this medicine. Men should inform their doctors if they wish to father a child. This medicine may lower sperm counts. What side effects may I notice from receiving this medication? Side effects that you should report to your doctor or health care professional as soon as possible: allergic reactions like skin rash, itching or hives, swelling of the face, lips, or tongue low blood counts - this medicine may decrease the number of white blood cells, red blood cells, and platelets. You may be at increased risk for infections and bleeding nausea, vomiting redness, blistering, peeling or loosening of the skin, including inside the mouth signs and symptoms of infection like fever; chills; cough; sore throat; pain or trouble passing urine signs and symptoms of low red blood cells or anemia such as unusually weak or tired; feeling faint or lightheaded; falls; breathing problems unusual bruising or bleeding Side effects that usually do not require medical attention (report to your doctor or health care professional if they  continue or are bothersome): changes in taste diarrhea hair loss loss of appetite mouth sores This list may not describe all possible side effects. Call your doctor for medical advice about side effects. You may report side effects to FDA at 1-800-FDA-1088. Where should I keep my medication? Keep out of the reach of children. Store in a refrigerator between 2 and 8 degrees C (36 and 46 degrees F). Do not freeze. Throw away any unused medicine after the expiration date. NOTE: This sheet is a summary. It may not cover all possible information. If you have questions about this medicine, talk to your doctor, pharmacist, or health care provider.  2023 Elsevier/Gold Standard (2021-08-22 00:00:00)  To help prevent nausea and vomiting after your treatment, we encourage you to take your nausea medication as directed.  BELOW ARE SYMPTOMS THAT SHOULD BE REPORTED IMMEDIATELY: *FEVER GREATER THAN 100.4 F (38 C) OR HIGHER *CHILLS OR SWEATING *NAUSEA AND VOMITING THAT IS NOT CONTROLLED WITH YOUR NAUSEA MEDICATION *UNUSUAL SHORTNESS OF BREATH *UNUSUAL BRUISING OR BLEEDING *URINARY PROBLEMS (pain or burning when urinating, or frequent urination) *BOWEL PROBLEMS (unusual diarrhea, constipation, pain near the anus) TENDERNESS IN MOUTH AND THROAT WITH OR WITHOUT PRESENCE OF ULCERS (sore throat, sores  in mouth, or a toothache) UNUSUAL RASH, SWELLING OR PAIN  UNUSUAL VAGINAL DISCHARGE OR ITCHING   Items with * indicate a potential emergency and should be followed up as soon as possible or go to the Emergency Department if any problems should occur.  Please show the CHEMOTHERAPY ALERT CARD or IMMUNOTHERAPY ALERT CARD at check-in to the Emergency Department and triage nurse.  Should you have questions after your visit or need to cancel or reschedule your appointment, please contact [facility] [phone]  and follow the prompts.  Office hours are 8:00 a.m. to 4:30 p.m. Monday - Friday. Please  note that voicemails left after 4:00 p.m. may not be returned until the following business day.  We are closed weekends and major holidays. You have access to a nurse at all times for urgent questions. Please call the main number to the clinic [phone] and follow the prompts.  For any non-urgent questions, you may also contact your provider using MyChart. We now offer e-Visits for anyone 55 and older to request care online for non-urgent symptoms. For details visit [email or web address].   Also download the MyChart app! Go to the app store, search "MyChart", open the app, select [facility], and log in with your MyChart username and password.  Masks are optional in the cancer centers. If you would like for your care team to wear a mask while they are taking care of you, please let them know. For doctor visits, patients may have with them one support person who is at least 71 years old. At this time, visitors are not allowed in the infusion area.

## 2022-03-18 NOTE — Progress Notes (Signed)
Patient tolerated chemotherapy with no complaints voiced. Side effects with management reviewed understanding verbalized. Port site clean and dry with no bruising or swelling noted at site. Good blood return noted before and after administration of chemotherapy. Band aid applied. Patient left in satisfactory condition with VSS and no s/s of distress noted. 

## 2022-03-19 ENCOUNTER — Inpatient Hospital Stay (HOSPITAL_COMMUNITY): Payer: PPO

## 2022-03-19 ENCOUNTER — Ambulatory Visit: Payer: PPO

## 2022-03-19 ENCOUNTER — Other Ambulatory Visit (HOSPITAL_COMMUNITY): Payer: Self-pay | Admitting: *Deleted

## 2022-03-19 VITALS — BP 137/96 | HR 82 | Temp 98.1°F | Resp 18

## 2022-03-19 DIAGNOSIS — Z5111 Encounter for antineoplastic chemotherapy: Secondary | ICD-10-CM | POA: Diagnosis not present

## 2022-03-19 DIAGNOSIS — C3411 Malignant neoplasm of upper lobe, right bronchus or lung: Secondary | ICD-10-CM

## 2022-03-19 MED ORDER — FLUCONAZOLE 100 MG PO TABS: ORAL_TABLET | ORAL | 0 refills | Status: DC

## 2022-03-19 MED ORDER — PEGFILGRASTIM-CBQV 6 MG/0.6ML ~~LOC~~ SOSY
6.0000 mg | PREFILLED_SYRINGE | Freq: Once | SUBCUTANEOUS | Status: AC
  Administered 2022-03-19: 6 mg via SUBCUTANEOUS
  Filled 2022-03-19: qty 0.6

## 2022-03-19 MED ORDER — NYSTATIN 100000 UNIT/ML MT SUSP: 5.0000 mL | Freq: Four times a day (QID) | OROMUCOSAL | 3 refills | Status: DC

## 2022-03-19 NOTE — Progress Notes (Signed)
Patient tolerated Udenyca injection with no complaints voiced.  Site clean and dry with no bruising or swelling noted.  No complaints of pain.  Discharged via wheelchair with vital signs stable and no signs or symptoms of distress noted.

## 2022-03-19 NOTE — Patient Instructions (Signed)
Larry Moore  Discharge Instructions: Thank you for choosing [facility] to provide your oncology and hematology care.  If you have a lab appointment with the [facility], please come in thru the Main Entrance and check in at the main information desk.  Wear comfortable clothing and clothing appropriate for easy access to any Portacath or PICC line.   We strive to give you quality time with your provider. You may need to reschedule your appointment if you arrive late (15 or more minutes).  Arriving late affects you and other patients whose appointments are after yours.  Also, if you miss three or more appointments without notifying the office, you may be dismissed from the clinic at the provider's discretion.      For prescription refill requests, have your pharmacy contact our office and allow 72 hours for refills to be completed.    Today you received the following chemotherapy and/or immunotherapy agents Udenyca.  Pegfilgrastim Injection What is this medication? PEGFILGRASTIM (PEG fil gra stim) lowers the risk of infection in people who are receiving chemotherapy. It works by Building control surveyor make more white blood cells, which protects your body from infection. It may also be used to help people who have been exposed to high doses of radiation. This medicine may be used for other purposes; ask your health care provider or pharmacist if you have questions. COMMON BRAND NAME(S): [employer], Neulasta, Nyvepria, Stimufend, UDENYCA, Ziextenzo What should I tell my care team before I take this medication? They need to know if you have any of these conditions: Kidney disease Latex allergy Ongoing radiation therapy Sickle cell disease Skin reactions to acrylic adhesives (On-Body Injector only) An unusual or allergic reaction to pegfilgrastim, filgrastim, other medications, foods, dyes, or preservatives Pregnant or trying to get pregnant Breast-feeding How  should I use this medication? This medication is for injection under the skin. If you get this medication at home, you will be taught how to prepare and give the pre-filled syringe or how to use the On-body Injector. Refer to the patient Instructions for Use for detailed instructions. Use exactly as directed. Tell your care team immediately if you suspect that the On-body Injector may not have performed as intended or if you suspect the use of the On-body Injector resulted in a missed or partial dose. It is important that you put your used needles and syringes in a special sharps container. Do not put them in a trash can. If you do not have a sharps container, call your pharmacist or care team to get one. Talk to your care team about the use of this medication in children. While this medication may be prescribed for selected conditions, precautions do apply. Overdosage: If you think you have taken too much of this medicine contact a poison control center or emergency room at once. NOTE: This medicine is only for you. Do not share this medicine with others. What if I miss a dose? It is important not to miss your dose. Call your care team if you miss your dose. If you miss a dose due to an On-body Injector failure or leakage, a new dose should be administered as soon as possible using a single prefilled syringe for manual use. What may interact with this medication? Interactions have not been studied. This list may not describe all possible interactions. Give your health care provider a list of all the medicines, herbs, non-prescription drugs, or dietary supplements you use. Also tell them  if you smoke, drink alcohol, or use illegal drugs. Some items may interact with your medicine. What should I watch for while using this medication? Your condition will be monitored carefully while you are receiving this medication. You may need blood work done while you are taking this medication. Talk to your care  team about your risk of cancer. You may be more at risk for certain types of cancer if you take this medication. If you are going to need a MRI, CT scan, or other procedure, tell your care team that you are using this medication (On-Body Injector only). What side effects may I notice from receiving this medication? Side effects that you should report to your care team as soon as possible: Allergic reactions--skin rash, itching, hives, swelling of the face, lips, tongue, or throat Capillary leak syndrome--stomach or muscle pain, unusual weakness or fatigue, feeling faint or lightheaded, decrease in the amount of urine, swelling of the ankles, hands, or feet, trouble breathing High white blood cell level--fever, fatigue, trouble breathing, night sweats, change in vision, weight loss Inflammation of the aorta--fever, fatigue, back, chest, or stomach pain, severe headache Kidney injury (glomerulonephritis)--decrease in the amount of urine, red or dark Tria Noguera urine, foamy or bubbly urine, swelling of the ankles, hands, or feet Shortness of breath or trouble breathing Spleen injury--pain in upper left stomach or shoulder Unusual bruising or bleeding Side effects that usually do not require medical attention (report to your care team if they continue or are bothersome): Bone pain Pain in the hands or feet This list may not describe all possible side effects. Call your doctor for medical advice about side effects. You may report side effects to FDA at 1-800-FDA-1088. Where should I keep my medication? Keep out of the reach of children. If you are using this medication at home, you will be instructed on how to store it. Throw away any unused medication after the expiration date on the label. NOTE: This sheet is a summary. It may not cover all possible information. If you have questions about this medicine, talk to your doctor, pharmacist, or health care provider.  2023 Elsevier/Gold Standard (2021-08-22  00:00:00)        To help prevent nausea and vomiting after your treatment, we encourage you to take your nausea medication as directed.  BELOW ARE SYMPTOMS THAT SHOULD BE REPORTED IMMEDIATELY: *FEVER GREATER THAN 100.4 F (38 C) OR HIGHER *CHILLS OR SWEATING *NAUSEA AND VOMITING THAT IS NOT CONTROLLED WITH YOUR NAUSEA MEDICATION *UNUSUAL SHORTNESS OF BREATH *UNUSUAL BRUISING OR BLEEDING *URINARY PROBLEMS (pain or burning when urinating, or frequent urination) *BOWEL PROBLEMS (unusual diarrhea, constipation, pain near the anus) TENDERNESS IN MOUTH AND THROAT WITH OR WITHOUT PRESENCE OF ULCERS (sore throat, sores in mouth, or a toothache) UNUSUAL RASH, SWELLING OR PAIN  UNUSUAL VAGINAL DISCHARGE OR ITCHING   Items with * indicate a potential emergency and should be followed up as soon as possible or go to the Emergency Department if any problems should occur.  Please show the CHEMOTHERAPY ALERT CARD or IMMUNOTHERAPY ALERT CARD at check-in to the Emergency Department and triage nurse.  Should you have questions after your visit or need to cancel or reschedule your appointment, please contact [facility] [phone]  and follow the prompts.  Office hours are 8:00 a.m. to 4:30 p.m. Monday - Friday. Please note that voicemails left after 4:00 p.m. may not be returned until the following business day.  We are closed weekends and major holidays. You  have access to a nurse at all times for urgent questions. Please call the main number to the clinic [phone] and follow the prompts.  For any non-urgent questions, you may also contact your provider using MyChart. We now offer e-Visits for anyone 32 and older to request care online for non-urgent symptoms. For details visit [email or web address].   Also download the MyChart app! Go to the app store, search "MyChart", open the app, select [facility], and log in with your MyChart username and password.  Masks are optional in the  cancer centers. If you would like for your care team to wear a mask while they are taking care of you, please let them know. For doctor visits, patients may have with them one support person who is at least 71 years old. At this time, visitors are not allowed in the infusion area.

## 2022-03-20 ENCOUNTER — Ambulatory Visit: Payer: PPO

## 2022-03-23 ENCOUNTER — Ambulatory Visit: Payer: PPO

## 2022-03-24 ENCOUNTER — Ambulatory Visit: Payer: PPO

## 2022-03-25 ENCOUNTER — Encounter: Payer: Self-pay | Admitting: Urology

## 2022-03-25 ENCOUNTER — Ambulatory Visit: Payer: PPO

## 2022-03-25 NOTE — Progress Notes (Signed)
Telephone appointment. I verified patients identity and began nursing interview. Esophageal pain 5/10. No other issues reported at this time.  Meaningful use complete. Reminded patient of his 9:00am-03/26/22 telephone appointment w/ Ashlyn Bruning PA-C. I left my extension [phone] in case patient needs anything. Patient verbalized understanding.  Patient contact [phone]

## 2022-03-26 ENCOUNTER — Ambulatory Visit
Admission: RE | Admit: 2022-03-26 | Discharge: 2022-03-26 | Disposition: A | Discharge status: Home / Self Care | Payer: PPO | Source: Ambulatory Visit | Attending: Urology | Admitting: Urology

## 2022-03-26 ENCOUNTER — Telehealth: Payer: Self-pay

## 2022-03-26 ENCOUNTER — Ambulatory Visit: Payer: PPO

## 2022-03-26 DIAGNOSIS — M79674 Pain in right toe(s): Secondary | ICD-10-CM | POA: Diagnosis not present

## 2022-03-26 DIAGNOSIS — C3411 Malignant neoplasm of upper lobe, right bronchus or lung: Secondary | ICD-10-CM

## 2022-03-26 DIAGNOSIS — B351 Tinea unguium: Secondary | ICD-10-CM | POA: Diagnosis not present

## 2022-03-26 NOTE — Telephone Encounter (Signed)
Under the direction of Ashlyn Bruning PA-C. I contacted the patient, verified his identity and notified the patient of the message below...    Per [employer]. "If you can reach out to him to let him know that I left him a VM about possibly trying some diflucan for yeast esophagitis but when I went to prescribe it, I noticed that Dr. Delton Coombes had already sent him a Rx for the Nystatin suspension on 03/19/22, so if he already took that, he does not need the diflucan."  Patient expressed verbal understanding.

## 2022-03-26 NOTE — Progress Notes (Signed)
  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  Beams/energy:   A 3D field set-up was employed with 6 MV X-rays  Narrative: The patient tolerated radiation treatment relatively well with mild esophagitis.  Plan: The patient has completed radiation treatment. The patient will return to radiation oncology clinic for routine followup in one month. I advised him to call or return sooner if he has any questions or concerns related to his recovery or treatment. ________________________________  Sheral Apley. Tammi Klippel, M.D.

## 2022-03-27 ENCOUNTER — Ambulatory Visit: Payer: PPO

## 2022-03-30 ENCOUNTER — Ambulatory Visit: Payer: PPO

## 2022-03-31 ENCOUNTER — Ambulatory Visit: Payer: PPO

## 2022-04-08 ENCOUNTER — Inpatient Hospital Stay (HOSPITAL_BASED_OUTPATIENT_CLINIC_OR_DEPARTMENT_OTHER): Payer: PPO | Admitting: Hematology

## 2022-04-08 ENCOUNTER — Inpatient Hospital Stay (HOSPITAL_COMMUNITY): Payer: PPO | Attending: Hematology

## 2022-04-08 ENCOUNTER — Inpatient Hospital Stay (HOSPITAL_COMMUNITY): Payer: PPO

## 2022-04-08 ENCOUNTER — Other Ambulatory Visit (HOSPITAL_COMMUNITY): Payer: Self-pay

## 2022-04-08 VITALS — BP 104/82 | HR 62 | Temp 96.5°F | Resp 18

## 2022-04-08 VITALS — BP 110/67 | HR 81 | Temp 96.7°F | Resp 18 | Ht 66.0 in | Wt 235.2 lb

## 2022-04-08 DIAGNOSIS — C3491 Malignant neoplasm of unspecified part of right bronchus or lung: Secondary | ICD-10-CM

## 2022-04-08 DIAGNOSIS — Z5111 Encounter for antineoplastic chemotherapy: Secondary | ICD-10-CM | POA: Insufficient documentation

## 2022-04-08 DIAGNOSIS — E876 Hypokalemia: Secondary | ICD-10-CM

## 2022-04-08 DIAGNOSIS — F17210 Nicotine dependence, cigarettes, uncomplicated: Secondary | ICD-10-CM | POA: Diagnosis not present

## 2022-04-08 DIAGNOSIS — Z79899 Other long term (current) drug therapy: Secondary | ICD-10-CM | POA: Insufficient documentation

## 2022-04-08 DIAGNOSIS — C3411 Malignant neoplasm of upper lobe, right bronchus or lung: Secondary | ICD-10-CM | POA: Insufficient documentation

## 2022-04-08 DIAGNOSIS — Z5189 Encounter for other specified aftercare: Secondary | ICD-10-CM | POA: Insufficient documentation

## 2022-04-08 DIAGNOSIS — Z5112 Encounter for antineoplastic immunotherapy: Secondary | ICD-10-CM | POA: Insufficient documentation

## 2022-04-08 LAB — CBC WITH DIFFERENTIAL/PLATELET
Abs Immature Granulocytes: 0.62 10*3/uL — ABNORMAL HIGH (ref 0.00–0.07)
Basophils Absolute: 0.1 10*3/uL (ref 0.0–0.1)
Basophils Relative: 1 %
Eosinophils Absolute: 0 10*3/uL (ref 0.0–0.5)
Eosinophils Relative: 0 %
HCT: 35.5 % — ABNORMAL LOW (ref 39.0–52.0)
Hemoglobin: 11.8 g/dL — ABNORMAL LOW (ref 13.0–17.0)
Immature Granulocytes: 5 %
Lymphocytes Relative: 8 %
Lymphs Abs: 1 10*3/uL (ref 0.7–4.0)
MCH: 31.1 pg (ref 26.0–34.0)
MCHC: 33.2 g/dL (ref 30.0–36.0)
MCV: 93.7 fL (ref 80.0–100.0)
Monocytes Absolute: 1.1 10*3/uL — ABNORMAL HIGH (ref 0.1–1.0)
Monocytes Relative: 8 %
Neutro Abs: 9.9 10*3/uL — ABNORMAL HIGH (ref 1.7–7.7)
Neutrophils Relative %: 78 %
Platelets: 415 10*3/uL — ABNORMAL HIGH (ref 150–400)
RBC: 3.79 MIL/uL — ABNORMAL LOW (ref 4.22–5.81)
RDW: 16.5 % — ABNORMAL HIGH (ref 11.5–15.5)
WBC: 12.7 10*3/uL — ABNORMAL HIGH (ref 4.0–10.5)
nRBC: 0.2 % (ref 0.0–0.2)

## 2022-04-08 LAB — COMPREHENSIVE METABOLIC PANEL
ALT: 9 U/L (ref 0–44)
AST: 12 U/L — ABNORMAL LOW (ref 15–41)
Albumin: 3.2 g/dL — ABNORMAL LOW (ref 3.5–5.0)
Alkaline Phosphatase: 69 U/L (ref 38–126)
Anion gap: 10 (ref 5–15)
BUN: 16 mg/dL (ref 8–23)
CO2: 29 mmol/L (ref 22–32)
Calcium: 7.7 mg/dL — ABNORMAL LOW (ref 8.9–10.3)
Chloride: 99 mmol/L (ref 98–111)
Creatinine, Ser: 0.87 mg/dL (ref 0.61–1.24)
GFR, Estimated: >60 mL/min (ref >60)
Glucose, Bld: 94 mg/dL (ref 70–99)
Potassium: 2.8 mmol/L — ABNORMAL LOW (ref 3.5–5.1)
Sodium: 138 mmol/L (ref 135–145)
Total Bilirubin: 0.2 mg/dL — ABNORMAL LOW (ref 0.3–1.2)
Total Protein: 6.1 g/dL — ABNORMAL LOW (ref 6.5–8.1)

## 2022-04-08 LAB — MAGNESIUM: Magnesium: 1.9 mg/dL (ref 1.7–2.4)

## 2022-04-08 LAB — TSH: TSH: 0.687 u[IU]/mL (ref 0.350–4.500)

## 2022-04-08 MED ORDER — SODIUM CHLORIDE 0.9 % IV SOLN
150.0000 mg | Freq: Once | INTRAVENOUS | Status: AC
  Administered 2022-04-08: 150 mg via INTRAVENOUS
  Filled 2022-04-08: qty 5

## 2022-04-08 MED ORDER — SODIUM CHLORIDE 0.9 % IV SOLN
1200.0000 mg | Freq: Once | INTRAVENOUS | Status: AC
  Administered 2022-04-08: 1200 mg via INTRAVENOUS
  Filled 2022-04-08: qty 20

## 2022-04-08 MED ORDER — SODIUM CHLORIDE 0.9 % IV SOLN: Freq: Once | INTRAVENOUS | Status: AC

## 2022-04-08 MED ORDER — PALONOSETRON HCL INJECTION 0.25 MG/5ML
0.2500 mg | Freq: Once | INTRAVENOUS | Status: AC
  Administered 2022-04-08: 0.25 mg via INTRAVENOUS
  Filled 2022-04-08: qty 5

## 2022-04-08 MED ORDER — POTASSIUM CHLORIDE CRYS ER 20 MEQ PO TBCR
40.0000 meq | EXTENDED_RELEASE_TABLET | Freq: Once | ORAL | Status: AC
  Administered 2022-04-08: 40 meq via ORAL
  Filled 2022-04-08: qty 2

## 2022-04-08 MED ORDER — DEXAMETHASONE SODIUM PHOSPHATE 10 MG/ML IJ SOLN
5.0000 mg | Freq: Once | INTRAMUSCULAR | Status: AC
  Administered 2022-04-08: 5 mg via INTRAVENOUS
  Filled 2022-04-08: qty 0.5

## 2022-04-08 MED ORDER — SODIUM CHLORIDE 0.9% FLUSH
10.0000 mL | INTRAVENOUS | Status: DC | PRN
  Administered 2022-04-08: 10 mL

## 2022-04-08 MED ORDER — SODIUM CHLORIDE 0.9 % IV SOLN
508.8000 mg | Freq: Once | INTRAVENOUS | Status: AC
  Administered 2022-04-08: 510 mg via INTRAVENOUS
  Filled 2022-04-08: qty 51

## 2022-04-08 MED ORDER — HEPARIN SOD (PORK) LOCK FLUSH 100 UNIT/ML IV SOLN
500.0000 [IU] | Freq: Once | INTRAVENOUS | Status: AC | PRN
  Administered 2022-04-08: 500 [IU]

## 2022-04-08 MED ORDER — SODIUM CHLORIDE 0.9 % IV SOLN: 5.0000 mg | Freq: Once | INTRAVENOUS | Status: DC

## 2022-04-08 MED ORDER — POTASSIUM CHLORIDE 10 MEQ/100ML IV SOLN
10.0000 meq | INTRAVENOUS | Status: AC
  Administered 2022-04-08 (×2): 10 meq via INTRAVENOUS
  Filled 2022-04-08 (×2): qty 100

## 2022-04-08 MED ORDER — SODIUM CHLORIDE 0.9 % IV SOLN
80.0000 mg/m2 | Freq: Once | INTRAVENOUS | Status: AC
  Administered 2022-04-08: 180 mg via INTRAVENOUS
  Filled 2022-04-08: qty 9

## 2022-04-08 MED ORDER — ALPRAZOLAM 1 MG PO TABS: 1.0000 mg | ORAL_TABLET | Freq: Every evening | ORAL | 3 refills | Status: DC | PRN

## 2022-04-08 NOTE — Progress Notes (Signed)
[name], [place] [address]   CLINIC:  Medical Oncology/Hematology  PCP:  [facility], [place] / [place] [address] [phone]   REASON FOR VISIT:  Follow-up for right lung mass and mediastinal lymphadenopathy  PRIOR THERAPY: none  NGS Results: not done  CURRENT THERAPY: Carboplatin + Etoposide + Atezolizumab Induction q21d / Atezolizumab   BRIEF ONCOLOGIC HISTORY:  Oncology History  Small cell lung cancer, right upper lobe ([facility])  01/22/2022 Initial Diagnosis   Small cell lung cancer, right upper lobe ([place])   01/22/2022 Cancer Staging   Staging form: Lung, AJCC 8th Edition - Clinical stage from 01/22/2022: Stage IVB (cT4, cN2, pM1c) - Signed by [name], MD on 02/16/2022 Histopathologic type: Small cell carcinoma, NOS Stage prefix: Initial diagnosis   02/23/2022 -  Chemotherapy   Patient is on Treatment Plan : LUNG SCLC Carboplatin + Etoposide + Atezolizumab Induction q21d / Atezolizumab Maintenance q21d       CANCER STAGING:  Cancer Staging  Small cell lung cancer, right upper lobe ([facility]) Staging form: Lung, AJCC 8th Edition - Clinical stage from 01/22/2022: Stage IVB (cT4, cN2, pM1c) - Signed by [name], MD on 02/16/2022   INTERVAL HISTORY:  [name], a 71 y.o. male, returns for routine follow-up and consideration for next cycle of chemotherapy. Jaeven was last seen on 03/16/2022.  Due for cycle #3 of Carboplatin + Etoposide + Atezolizumab today.   Overall, he tells me he has been feeling pretty well. He reports he is able to swallow. He reports he has a gout flare in his left ankle and foot causing swelling for which he takes allopurinol and prednisone. He reports he is taking 3 tablets of potassium daily. He reports his appetite is fair. He denies nausea. He reports his energy waxes and wanes, and he is able to walk some days without using his walker.   Overall, he  feels ready for next cycle of chemo today.    REVIEW OF SYSTEMS:  Review of Systems  Constitutional:  Positive for fatigue. Negative for appetite change.  HENT:   Negative for trouble swallowing.   Respiratory:  Positive for cough.   Cardiovascular:  Positive for leg swelling (L ankle and foot) and palpitations.  Gastrointestinal:  Negative for nausea.  Musculoskeletal:  Positive for back pain (6/10).  Neurological:  Positive for dizziness.  All other systems reviewed and are negative.   PAST MEDICAL/SURGICAL HISTORY:  Past Medical History:  Diagnosis Date   Dyspnea    Dysrhythmia    hx a-fib   Hypertension    Insomnia    Pre-diabetes    Sleep apnea    Past Surgical History:  Procedure Laterality Date   ATRIAL FIBRILLATION ABLATION     approx 2020   CARDIAC CATHETERIZATION     HERNIA REPAIR     IR IMAGING GUIDED PORT INSERTION  02/17/2022   IR VENO/JUGULAR RIGHT  02/17/2022   VIDEO BRONCHOSCOPY WITH ENDOBRONCHIAL ULTRASOUND Bilateral 01/29/2022   Procedure: VIDEO BRONCHOSCOPY WITH ENDOBRONCHIAL ULTRASOUND;  Surgeon: Garner Nash, DO;  Location: [facility];  Service: Cardiopulmonary;  Laterality: Bilateral;  w/ Guardant [id number]    SOCIAL HISTORY:  Social History   Socioeconomic History   Marital status: Married    Spouse name: Not on file   Number of children: Not on file   Years of education: Not on file   Highest education level: Not on file  Occupational History  Occupation: Retired  Tobacco Use   Smoking status: Every Day    Types: Cigarettes    Passive exposure: Current   Smokeless tobacco: Not on file   Tobacco comments:    Pt smokes 1 ppd. 01/26/22  Vaping Use   Vaping Use: Never used  Substance and Sexual Activity   Alcohol use: Not Currently    Comment: 1 bottle wine a day    Drug use: No   Sexual activity: Not on file  Other Topics Concern   Not on file  Social History Narrative   Not on file   Social Determinants of Health   Financial  Resource Strain: Not on file  Food Insecurity: Not on file  Transportation Needs: Not on file  Physical Activity: Not on file  Stress: Not on file  Social Connections: Not on file  Intimate Partner Violence: Not on file    FAMILY HISTORY:  Family History  Problem Relation Age of Onset   Coronary artery disease Father    Diabetes Father     CURRENT MEDICATIONS:  Current Outpatient Medications  Medication Sig Dispense Refill   ALPRAZolam (XANAX) 0.5 MG tablet Take 1 tablet (0.5 mg total) by mouth 2 (two) times daily as needed for anxiety. 60 tablet 5   Atezolizumab (TECENTRIQ IV) Inject into the vein every 21 ( twenty-one) days.     baclofen (LIORESAL) 10 MG tablet Take 1-2 tablets (10-20 mg total) by mouth 3 (three) times daily. 180 each 0   CARBOPLATIN IV Inject into the vein every 21 ( twenty-one) days.     ETOPOSIDE IV Inject into the vein every 21 ( twenty-one) days. Days 1-3 every 21 days     furosemide (LASIX) 80 MG tablet Take 80 mg by mouth daily.     lidocaine (XYLOCAINE) 2 % solution Use as directed 15 mLs in the mouth or throat as needed for mouth pain (prior to meals/supplements to decrease pain with swallowing). Mix 1:1 with Maalox 100 mL 6   lidocaine-prilocaine (EMLA) cream Apply topically as directed.     metolazone (ZAROXOLYN) 2.5 MG tablet Take 2.5 mg by mouth daily.     nystatin (MYCOSTATIN) 100000 UNIT/ML suspension Take 5 mLs (500,000 Units total) by mouth 4 (four) times daily. 120 mL 3   ondansetron (ZOFRAN) 8 MG tablet Take 1 tablet (8 mg total) by mouth every 8 (eight) hours as needed for refractory nausea / vomiting. Start on day 3 after carboplatin chemo. 30 tablet 1   prochlorperazine (COMPAZINE) 10 MG tablet Take 1 tablet (10 mg total) by mouth every 6 (six) hours as needed (Nausea or vomiting). 60 tablet 3   sotalol (BETAPACE) 80 MG tablet SMARTSIG:1 Tablet(s) By Mouth Every 12 Hours     sucralfate (CARAFATE) 1 GM/10ML suspension TAKE 10MLS BY MOUTH 4 TIMES  DAILY AS NEEDED. 480 mL 6   Vitamin D, Ergocalciferol, (DRISDOL) 1.25 MG (50000 UNIT) CAPS capsule Take 50,000 Units by mouth daily.     No current facility-administered medications for this visit.    ALLERGIES:  No Known Allergies  PHYSICAL EXAM:  Performance status (ECOG): 1 - Symptomatic but completely ambulatory  There were no vitals filed for this visit. Wt Readings from Last 3 Encounters:  03/16/22 233 lb 6.4 oz (105.9 kg)  02/23/22 241 lb 9.6 oz (109.6 kg)  02/17/22 235 lb (106.6 kg)   Physical Exam Vitals reviewed.  Constitutional:      Appearance: Normal appearance. He is obese.  Cardiovascular:  Rate and Rhythm: Normal rate and regular rhythm.     Pulses: Normal pulses.     Heart sounds: Normal heart sounds.  Pulmonary:     Effort: Pulmonary effort is normal.     Breath sounds: Normal breath sounds.  Neurological:     General: No focal deficit present.     Mental Status: He is alert and oriented to person, place, and time.  Psychiatric:        Mood and Affect: Mood normal.        Behavior: Behavior normal.     LABORATORY DATA:  I have reviewed the labs as listed.     Latest Ref Rng & Units 03/16/2022    9:23 AM 03/05/2022    9:10 AM 02/23/2022    9:25 AM  CBC  WBC 4.0 - 10.5 K/uL 8.9  0.9  7.6   Hemoglobin 13.0 - 17.0 g/dL 14.0  13.8  15.5   Hematocrit 39.0 - 52.0 % 41.2  40.1  46.2   Platelets 150 - 400 K/uL 376  47  201       Latest Ref Rng & Units 03/16/2022    9:23 AM 03/05/2022    9:10 AM 02/23/2022    9:25 AM  CMP  Glucose 70 - 99 mg/dL 136  102  176   BUN 8 - 23 mg/dL 9  11  13    Creatinine 0.61 - 1.24 mg/dL 0.92  0.72  0.91   Sodium 135 - 145 mmol/L 135  133  136   Potassium 3.5 - 5.1 mmol/L 2.6  2.9  2.4   Chloride 98 - 111 mmol/L 94  95  97   CO2 22 - 32 mmol/L 32  31  31   Calcium 8.9 - 10.3 mg/dL 8.1  8.1  8.1   Total Protein 6.5 - 8.1 g/dL 6.8  6.4  6.6   Total Bilirubin 0.3 - 1.2 mg/dL 0.6  1.1  0.9   Alkaline Phos 38 - 126 U/L 79   66  71   AST 15 - 41 U/L 17  10  15    ALT 0 - 44 U/L 13  11  10      DIAGNOSTIC IMAGING:  I have independently reviewed the scans and discussed with the patient. No results found.   ASSESSMENT:  Extensive stage small cell lung cancer: - Productive cough for 3 weeks with hemoptysis.  Hemoptysis improved after Eliquis stopped 2 weeks ago.  Still has some purulent sputum.  Currently on Levaquin. - Abnormal chest x-ray on 01/19/2022 for productive cough - CT chest with contrast (01/21/2022): Right hilar mass measuring 6.5 x 5.2 cm surrounding and compressing the right upper lobe bronchus, bronchus intermedius, right middle lobe bronchus and right lower lobe bronchus centrally.  Compression of the right lower lobe pulmonary artery.  Nodular soft tissue lesion in the right lower lobe continuous with right hilar mass and could represent endobronchial spread.  Enlarged mediastinal lymph nodes but no findings of pulmonary metastatic disease.  Indeterminate bilateral adrenal gland nodules and low attenuation liver lesions, likely benign cyst. - No weight loss.  No chest pains. - PET scan: Right hilar mass involving upper lobe, middle lobe and lower lobe with satellite nodularity in the right lower lobe, hypermetabolic subcarinal and right paratracheal lymphadenopathy.  Pleural-based nodule along the right posterior costophrenic angle.  Hypermetabolic left adrenal lesion.  Small hypermetabolic lesions in the humerus bilaterally and a subcutaneous focus in the right posterior lower back. -  Right lung hilar mass biopsy (01/29/2022): Small cell carcinoma - Cycle 1 of carboplatin, etoposide and Atezolizumab on 02/23/2022    Social/family history: - He lives at home with his wife Jana Half.  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  Retired 8 years ago.  Smoked 1 pack/day for 50 years. - Father had renal cell carcinoma.   PLAN:  Extensive stage small cell lung cancer: - He has tolerated last cycle  reasonably well.  He developed some fatigue during the third week.  He also had flareup of gout of left MTP joint. - Reviewed labs today which shows normal LFTs and creatinine.  CBC was grossly normal with anemia from myelosuppression.  TSH is 0.6.  No immunotherapy related side effects. - Proceed with cycle 3 of chemotherapy today.  Carboplatin will be dose reduced to 400 mg and VP-16 to 80 mg per metered square. - We will decrease his dexamethasone to 5 mg prior to chemotherapy. - RTC 3 weeks with repeat CT CAP to evaluate response.  2.  Esophagitis: - This has improved in the last few weeks.  He is not requiring Carafate with lidocaine.  3.  Hypokalemia: - He is taking potassium 10 mEq tablets 3/day. - Swallowing has improved.  Potassium today is low at 2.8.  He will receive IV and p.o. potassium today. - Recommend increasing potassium to 60 mEq daily.  Continue Lasix with metolazone once a week.   Orders placed this encounter:  No orders of the defined types were placed in this encounter.    [name], MD [facility] [phone]   I, Thana Ates, am acting as a scribe for Dr. [name].  I, [name] MD, have reviewed the above documentation for accuracy and completeness, and I agree with the above.

## 2022-04-08 NOTE — Patient Instructions (Addendum)
[facility]  Discharge Instructions: Thank you for choosing [facility] to provide your oncology and hematology care.  If you have a lab appointment with the [facility], please come in thru the Main Entrance and check in at the main information desk.  Wear comfortable clothing and clothing appropriate for easy access to any Portacath or PICC line.   We strive to give you quality time with your provider. You may need to reschedule your appointment if you arrive late (15 or more minutes).  Arriving late affects you and other patients whose appointments are after yours.  Also, if you miss three or more appointments without notifying the office, you may be dismissed from the clinic at the provider's discretion.      For prescription refill requests, have your pharmacy contact our office and allow 72 hours for refills to be completed.    Today you received the following chemotherapy and/or immunotherapy agents Carbplatin, Etoposide, and Tecentriq, return as scheduled. Carboplatin injection What is this medication? CARBOPLATIN (KAR boe pla tin) is a chemotherapy drug. It targets fast dividing cells, like cancer cells, and causes these cells to die. This medicine is used to treat ovarian cancer and many other cancers. This medicine may be used for other purposes; ask your health care provider or pharmacist if you have questions. COMMON BRAND NAME(S): Paraplatin What should I tell my care team before I take this medication? They need to know if you have any of these conditions: blood disorders hearing problems kidney disease recent or ongoing radiation therapy an unusual or allergic reaction to carboplatin, cisplatin, other chemotherapy, other medicines, foods, dyes, or preservatives pregnant or trying to get pregnant breast-feeding How should I use this medication? This drug is usually given as an infusion into a vein. It is administered in a hospital or clinic by a  specially trained health care professional. Talk to your pediatrician regarding the use of this medicine in children. Special care may be needed. Overdosage: If you think you have taken too much of this medicine contact a poison control center or emergency room at once. NOTE: This medicine is only for you. Do not share this medicine with others. What if I miss a dose? It is important not to miss a dose. Call your doctor or health care professional if you are unable to keep an appointment. What may interact with this medication? medicines for seizures medicines to increase blood counts like filgrastim, pegfilgrastim, sargramostim some antibiotics like amikacin, gentamicin, neomycin, streptomycin, tobramycin vaccines Talk to your doctor or health care professional before taking any of these medicines: acetaminophen aspirin ibuprofen ketoprofen naproxen This list may not describe all possible interactions. Give your health care provider a list of all the medicines, herbs, non-prescription drugs, or dietary supplements you use. Also tell them if you smoke, drink alcohol, or use illegal drugs. Some items may interact with your medicine. What should I watch for while using this medication? Your condition will be monitored carefully while you are receiving this medicine. You will need important blood work done while you are taking this medicine. This drug may make you feel generally unwell. This is not uncommon, as chemotherapy can affect healthy cells as well as cancer cells. Report any side effects. Continue your course of treatment even though you feel ill unless your doctor tells you to stop. In some cases, you may be given additional medicines to help with side effects. Follow all directions for their use. Call your doctor  or health care professional for advice if you get a fever, chills or sore throat, or other symptoms of a cold or flu. Do not treat yourself. This drug decreases your body's  ability to fight infections. Try to avoid being around people who are sick. This medicine may increase your risk to bruise or bleed. Call your doctor or health care professional if you notice any unusual bleeding. Be careful brushing and flossing your teeth or using a toothpick because you may get an infection or bleed more easily. If you have any dental work done, tell your dentist you are receiving this medicine. Avoid taking products that contain aspirin, acetaminophen, ibuprofen, naproxen, or ketoprofen unless instructed by your doctor. These medicines may hide a fever. Do not become pregnant while taking this medicine. Women should inform their doctor if they wish to become pregnant or think they might be pregnant. There is a potential for serious side effects to an unborn child. Talk to your health care professional or pharmacist for more information. Do not breast-feed an infant while taking this medicine. What side effects may I notice from receiving this medication? Side effects that you should report to your doctor or health care professional as soon as possible: allergic reactions like skin rash, itching or hives, swelling of the face, lips, or tongue signs of infection - fever or chills, cough, sore throat, pain or difficulty passing urine signs of decreased platelets or bleeding - bruising, pinpoint red spots on the skin, black, tarry stools, nosebleeds signs of decreased red blood cells - unusually weak or tired, fainting spells, lightheadedness breathing problems changes in hearing changes in vision chest pain high blood pressure low blood counts - This drug may decrease the number of white blood cells, red blood cells and platelets. You may be at increased risk for infections and bleeding. nausea and vomiting pain, swelling, redness or irritation at the injection site pain, tingling, numbness in the hands or feet problems with balance, talking, walking trouble passing urine or  change in the amount of urine Side effects that usually do not require medical attention (report to your doctor or health care professional if they continue or are bothersome): hair loss loss of appetite metallic taste in the mouth or changes in taste This list may not describe all possible side effects. Call your doctor for medical advice about side effects. You may report side effects to FDA at 1-800-FDA-1088. Where should I keep my medication? This drug is given in a hospital or clinic and will not be stored at home. NOTE: This sheet is a summary. It may not cover all possible information. If you have questions about this medicine, talk to your doctor, pharmacist, or health care provider.  2023 Elsevier/Gold Standard (2008-02-29 00:00:00) Atezolizumab injection What is this medication? ATEZOLIZUMAB (a te zoe LIZ ue mab) is a monoclonal antibody. It is used to treat bladder cancer (urothelial cancer), liver cancer, lung cancer, and melanoma. This medicine may be used for other purposes; ask your health care provider or pharmacist if you have questions. COMMON BRAND NAME(S): Tecentriq What should I tell my care team before I take this medication? They need to know if you have any of these conditions: autoimmune diseases like Crohn's disease, ulcerative colitis, or lupus have had or planning to have an allogeneic stem cell transplant (uses someone else's stem cells) history of organ transplant history of radiation to the chest nervous system problems like myasthenia gravis or Guillain-Barre syndrome an unusual or allergic reaction to  atezolizumab, other medicines, foods, dyes, or preservatives pregnant or trying to get pregnant breast-feeding How should I use this medication? This medicine is for infusion into a vein. It is given by a health care professional in a hospital or clinic setting. A special MedGuide will be given to you before each treatment. Be sure to read this information  carefully each time. Talk to your pediatrician regarding the use of this medicine in children. Special care may be needed. Overdosage: If you think you have taken too much of this medicine contact a poison control center or emergency room at once. NOTE: This medicine is only for you. Do not share this medicine with others. What if I miss a dose? It is important not to miss your dose. Call your doctor or health care professional if you are unable to keep an appointment. What may interact with this medication? Interactions have not been studied. This list may not describe all possible interactions. Give your health care provider a list of all the medicines, herbs, non-prescription drugs, or dietary supplements you use. Also tell them if you smoke, drink alcohol, or use illegal drugs. Some items may interact with your medicine. What should I watch for while using this medication? Your condition will be monitored carefully while you are receiving this medicine. You may need blood work done while you are taking this medicine. Do not become pregnant while taking this medicine or for at least 5 months after stopping it. Women should inform their doctor if they wish to become pregnant or think they might be pregnant. There is a potential for serious side effects to an unborn child. Talk to your health care professional or pharmacist for more information. Do not breast-feed an infant while taking this medicine or for at least 5 months after the last dose. What side effects may I notice from receiving this medication? Side effects that you should report to your doctor or health care professional as soon as possible: allergic reactions like skin rash, itching or hives, swelling of the face, lips, or tongue black, tarry stools bloody or watery diarrhea breathing problems changes in vision chest pain or chest tightness chills facial flushing fever headache signs and symptoms of high blood sugar such as  dizziness; dry mouth; dry skin; fruity breath; nausea; stomach pain; increased hunger or thirst; increased urination signs and symptoms of liver injury like dark yellow or brown urine; general ill feeling or flu-like symptoms; light-colored stools; loss of appetite; nausea; right upper belly pain; unusually weak or tired; yellowing of the eyes or skin stomach pain trouble passing urine or change in the amount of urine Side effects that usually do not require medical attention (report to your doctor or health care professional if they continue or are bothersome): bone pain cough diarrhea joint pain muscle pain muscle weakness swelling of arms or legs tiredness weight loss This list may not describe all possible side effects. Call your doctor for medical advice about side effects. You may report side effects to FDA at 1-800-FDA-1088. Where should I keep my medication? This drug is given in a hospital or clinic and will not be stored at home. NOTE: This sheet is a summary. It may not cover all possible information. If you have questions about this medicine, talk to your doctor, pharmacist, or health care provider.  2023 Elsevier/Gold Standard (2021-08-22 00:00:00) Etoposide, VP-16 injection What is this medication? ETOPOSIDE, VP-16 (e toe POE side) is a chemotherapy drug. It is used to treat testicular cancer,  lung cancer, and other cancers. This medicine may be used for other purposes; ask your health care provider or pharmacist if you have questions. COMMON BRAND NAME(S): Etopophos, Toposar, VePesid What should I tell my care team before I take this medication? They need to know if you have any of these conditions: infection kidney disease liver disease low blood counts, like low white cell, platelet, or red cell counts an unusual or allergic reaction to etoposide, other medicines, foods, dyes, or preservatives pregnant or trying to get pregnant breast-feeding How should I use this  medication? This medicine is for infusion into a vein. It is administered in a hospital or clinic by a specially trained health care professional. Talk to your pediatrician regarding the use of this medicine in children. Special care may be needed. Overdosage: If you think you have taken too much of this medicine contact a poison control center or emergency room at once. NOTE: This medicine is only for you. Do not share this medicine with others. What if I miss a dose? It is important not to miss your dose. Call your doctor or health care professional if you are unable to keep an appointment. What may interact with this medication? This medicine may interact with the following medications: warfarin This list may not describe all possible interactions. Give your health care provider a list of all the medicines, herbs, non-prescription drugs, or dietary supplements you use. Also tell them if you smoke, drink alcohol, or use illegal drugs. Some items may interact with your medicine. What should I watch for while using this medication? Visit your doctor for checks on your progress. This drug may make you feel generally unwell. This is not uncommon, as chemotherapy can affect healthy cells as well as cancer cells. Report any side effects. Continue your course of treatment even though you feel ill unless your doctor tells you to stop. In some cases, you may be given additional medicines to help with side effects. Follow all directions for their use. Call your doctor or health care professional for advice if you get a fever, chills or sore throat, or other symptoms of a cold or flu. Do not treat yourself. This drug decreases your body's ability to fight infections. Try to avoid being around people who are sick. This medicine may increase your risk to bruise or bleed. Call your doctor or health care professional if you notice any unusual bleeding. Talk to your doctor about your risk of cancer. You may be  more at risk for certain types of cancers if you take this medicine. Do not become pregnant while taking this medicine or for at least 6 months after stopping it. Women should inform their doctor if they wish to become pregnant or think they might be pregnant. Women of child-bearing potential will need to have a negative pregnancy test before starting this medicine. There is a potential for serious side effects to an unborn child. Talk to your health care professional or pharmacist for more information. Do not breast-feed an infant while taking this medicine. Men must use a latex condom during sexual contact with a woman while taking this medicine and for at least 4 months after stopping it. A latex condom is needed even if you have had a vasectomy. Contact your doctor right away if your partner becomes pregnant. Do not donate sperm while taking this medicine and for at least 4 months after you stop taking this medicine. Men should inform their doctors if they wish to father  a child. This medicine may lower sperm counts. What side effects may I notice from receiving this medication? Side effects that you should report to your doctor or health care professional as soon as possible: allergic reactions like skin rash, itching or hives, swelling of the face, lips, or tongue low blood counts - this medicine may decrease the number of white blood cells, red blood cells, and platelets. You may be at increased risk for infections and bleeding nausea, vomiting redness, blistering, peeling or loosening of the skin, including inside the mouth signs and symptoms of infection like fever; chills; cough; sore throat; pain or trouble passing urine signs and symptoms of low red blood cells or anemia such as unusually weak or tired; feeling faint or lightheaded; falls; breathing problems unusual bruising or bleeding Side effects that usually do not require medical attention (report to your doctor or health care  professional if they continue or are bothersome): changes in taste diarrhea hair loss loss of appetite mouth sores This list may not describe all possible side effects. Call your doctor for medical advice about side effects. You may report side effects to FDA at 1-800-FDA-1088. Where should I keep my medication? This drug is given in a hospital or clinic and will not be stored at home. NOTE: This sheet is a summary. It may not cover all possible information. If you have questions about this medicine, talk to your doctor, pharmacist, or health care provider.  2023 Elsevier/Gold Standard (2021-08-22 00:00:00)    To help prevent nausea and vomiting after your treatment, we encourage you to take your nausea medication as directed.  BELOW ARE SYMPTOMS THAT SHOULD BE REPORTED IMMEDIATELY: *FEVER GREATER THAN 100.4 F (38 C) OR HIGHER *CHILLS OR SWEATING *NAUSEA AND VOMITING THAT IS NOT CONTROLLED WITH YOUR NAUSEA MEDICATION *UNUSUAL SHORTNESS OF BREATH *UNUSUAL BRUISING OR BLEEDING *URINARY PROBLEMS (pain or burning when urinating, or frequent urination) *BOWEL PROBLEMS (unusual diarrhea, constipation, pain near the anus) TENDERNESS IN MOUTH AND THROAT WITH OR WITHOUT PRESENCE OF ULCERS (sore throat, sores in mouth, or a toothache) UNUSUAL RASH, SWELLING OR PAIN  UNUSUAL VAGINAL DISCHARGE OR ITCHING   Items with * indicate a potential emergency and should be followed up as soon as possible or go to the Emergency Department if any problems should occur.  Please show the CHEMOTHERAPY ALERT CARD or IMMUNOTHERAPY ALERT CARD at check-in to the Emergency Department and triage nurse.  Should you have questions after your visit or need to cancel or reschedule your appointment, please contact [facility] [phone]  and follow the prompts.  Office hours are 8:00 a.m. to 4:30 p.m. Monday - Friday. Please note that voicemails left after 4:00 p.m. may not be returned until the following  business day.  We are closed weekends and major holidays. You have access to a nurse at all times for urgent questions. Please call the main number to the clinic [phone] and follow the prompts.  For any non-urgent questions, you may also contact your provider using MyChart. We now offer e-Visits for anyone 65 and older to request care online for non-urgent symptoms. For details visit [email or web address].   Also download the MyChart app! Go to the app store, search "MyChart", open the app, select [facility], and log in with your MyChart username and password.  Masks are optional in the cancer centers. If you would like for your care team to wear a mask while they are taking care of you, please let them know. For  doctor visits, patients may have with them one support person who is at least 71 years old. At this time, visitors are not allowed in the infusion area.

## 2022-04-08 NOTE — Progress Notes (Signed)
Patient presents today for treatment, patient and labs accessed by Dr. Delton Coombes. Potassium 2.8 received orders to administer 40 mEq PO potassium, 3mEq IV potassium, 40 mEq PO potassium. Patient tolerated chemotherapy with no complaints voiced. Side effects with management reviewed understanding verbalized. Port site clean and dry with no bruising or swelling noted at site. Good blood return noted before and after administration of chemotherapy. Patient left in satisfactory condition with VSS and no s/s of distress noted.

## 2022-04-08 NOTE — Patient Instructions (Signed)
[facility] at [facility] Discharge Instructions  You were seen and examined today by Dr. Delton Coombes.  Dr. Delton Coombes discussed your most recent lab work today. Your potassium remains low. Increase Potassium to 53mEq twice daily. Increase your fruit intake, most fruits and fruit juices have increased Potassium.  Follow-up as scheduled.  Thank you for choosing [facility] at [facility] to provide your oncology and hematology care.  To afford each patient quality time with our provider, please arrive at least 15 minutes before your scheduled appointment time.   If you have a lab appointment with the [facility] please come in thru the Main Entrance and check in at the main information desk.  You need to re-schedule your appointment should you arrive 10 or more minutes late.  We strive to give you quality time with our providers, and arriving late affects you and other patients whose appointments are after yours.  Also, if you no show three or more times for appointments you may be dismissed from the clinic at the providers discretion.     Again, thank you for choosing [facility].  Our hope is that these requests will decrease the amount of time that you wait before being seen by our physicians.       _____________________________________________________________  Should you have questions after your visit to [facility], please contact our office at [phone] and follow the prompts.  Our office hours are 8:00 a.m. and 4:30 p.m. Monday - Friday.  Please note that voicemails left after 4:00 p.m. may not be returned until the following business day.  We are closed weekends and major holidays.  You do have access to a nurse 24-7, just call the main number to the clinic [phone] and do not press any options, hold on the line and a nurse will answer the phone.    For prescription refill requests, have your pharmacy contact  our office and allow 72 hours.

## 2022-04-09 ENCOUNTER — Inpatient Hospital Stay (HOSPITAL_COMMUNITY): Payer: PPO

## 2022-04-09 VITALS — BP 110/78 | HR 66 | Temp 96.7°F | Resp 18

## 2022-04-09 DIAGNOSIS — Z5111 Encounter for antineoplastic chemotherapy: Secondary | ICD-10-CM | POA: Diagnosis not present

## 2022-04-09 DIAGNOSIS — C3411 Malignant neoplasm of upper lobe, right bronchus or lung: Secondary | ICD-10-CM

## 2022-04-09 MED ORDER — SODIUM CHLORIDE 0.9% FLUSH
10.0000 mL | INTRAVENOUS | Status: DC | PRN
  Administered 2022-04-09: 10 mL

## 2022-04-09 MED ORDER — DEXAMETHASONE SODIUM PHOSPHATE 10 MG/ML IJ SOLN
5.0000 mg | Freq: Once | INTRAMUSCULAR | Status: AC
  Administered 2022-04-09: 5 mg via INTRAVENOUS
  Filled 2022-04-09: qty 0.5

## 2022-04-09 MED ORDER — SODIUM CHLORIDE 0.9 % IV SOLN: Freq: Once | INTRAVENOUS | Status: AC

## 2022-04-09 MED ORDER — SODIUM CHLORIDE 0.9 % IV SOLN: 5.0000 mg | Freq: Once | INTRAVENOUS | Status: DC

## 2022-04-09 MED ORDER — SODIUM CHLORIDE 0.9 % IV SOLN
80.0000 mg/m2 | Freq: Once | INTRAVENOUS | Status: AC
  Administered 2022-04-09: 180 mg via INTRAVENOUS
  Filled 2022-04-09: qty 9

## 2022-04-09 MED ORDER — HEPARIN SOD (PORK) LOCK FLUSH 100 UNIT/ML IV SOLN
500.0000 [IU] | Freq: Once | INTRAVENOUS | Status: AC | PRN
  Administered 2022-04-09: 500 [IU]

## 2022-04-09 NOTE — Progress Notes (Signed)
Treatment given per orders. Patient tolerated it well without problems. Vitals stable and discharged home from clinic ambulatory. Follow up as scheduled.  

## 2022-04-09 NOTE — Patient Instructions (Signed)
[facility]  Discharge Instructions: Thank you for choosing [facility] to provide your oncology and hematology care.  If you have a lab appointment with the [facility], please come in thru the Main Entrance and check in at the main information desk.  Wear comfortable clothing and clothing appropriate for easy access to any Portacath or PICC line.   We strive to give you quality time with your provider. You may need to reschedule your appointment if you arrive late (15 or more minutes).  Arriving late affects you and other patients whose appointments are after yours.  Also, if you miss three or more appointments without notifying the office, you may be dismissed from the clinic at the provider's discretion.      For prescription refill requests, have your pharmacy contact our office and allow 72 hours for refills to be completed.    Today you received the following chemotherapy and/or immunotherapy agents Etoposide       To help prevent nausea and vomiting after your treatment, we encourage you to take your nausea medication as directed.  BELOW ARE SYMPTOMS THAT SHOULD BE REPORTED IMMEDIATELY: *FEVER GREATER THAN 100.4 F (38 C) OR HIGHER *CHILLS OR SWEATING *NAUSEA AND VOMITING THAT IS NOT CONTROLLED WITH YOUR NAUSEA MEDICATION *UNUSUAL SHORTNESS OF BREATH *UNUSUAL BRUISING OR BLEEDING *URINARY PROBLEMS (pain or burning when urinating, or frequent urination) *BOWEL PROBLEMS (unusual diarrhea, constipation, pain near the anus) TENDERNESS IN MOUTH AND THROAT WITH OR WITHOUT PRESENCE OF ULCERS (sore throat, sores in mouth, or a toothache) UNUSUAL RASH, SWELLING OR PAIN  UNUSUAL VAGINAL DISCHARGE OR ITCHING   Items with * indicate a potential emergency and should be followed up as soon as possible or go to the Emergency Department if any problems should occur.  Please show the CHEMOTHERAPY ALERT CARD or IMMUNOTHERAPY ALERT CARD at check-in to the Emergency  Department and triage nurse.  Should you have questions after your visit or need to cancel or reschedule your appointment, please contact [facility] [phone]  and follow the prompts.  Office hours are 8:00 a.m. to 4:30 p.m. Monday - Friday. Please note that voicemails left after 4:00 p.m. may not be returned until the following business day.  We are closed weekends and major holidays. You have access to a nurse at all times for urgent questions. Please call the main number to the clinic [phone] and follow the prompts.  For any non-urgent questions, you may also contact your provider using MyChart. We now offer e-Visits for anyone 84 and older to request care online for non-urgent symptoms. For details visit [email or web address].   Also download the MyChart app! Go to the app store, search "MyChart", open the app, select [facility], and log in with your MyChart username and password.  Masks are optional in the cancer centers. If you would like for your care team to wear a mask while they are taking care of you, please let them know. For doctor visits, patients may have with them one support person who is at least 71 years old. At this time, visitors are not allowed in the infusion area.

## 2022-04-10 ENCOUNTER — Inpatient Hospital Stay (HOSPITAL_COMMUNITY): Payer: PPO

## 2022-04-10 ENCOUNTER — Encounter (HOSPITAL_COMMUNITY): Payer: Self-pay

## 2022-04-10 VITALS — BP 142/76 | HR 63 | Temp 96.9°F | Resp 18

## 2022-04-10 DIAGNOSIS — Z5111 Encounter for antineoplastic chemotherapy: Secondary | ICD-10-CM | POA: Diagnosis not present

## 2022-04-10 DIAGNOSIS — C3411 Malignant neoplasm of upper lobe, right bronchus or lung: Secondary | ICD-10-CM

## 2022-04-10 MED ORDER — DEXAMETHASONE SODIUM PHOSPHATE 10 MG/ML IJ SOLN
5.0000 mg | Freq: Once | INTRAMUSCULAR | Status: AC
  Administered 2022-04-10: 5 mg via INTRAVENOUS
  Filled 2022-04-10: qty 0.5

## 2022-04-10 MED ORDER — SODIUM CHLORIDE 0.9 % IV SOLN: Freq: Once | INTRAVENOUS | Status: AC

## 2022-04-10 MED ORDER — SODIUM CHLORIDE 0.9 % IV SOLN
80.0000 mg/m2 | Freq: Once | INTRAVENOUS | Status: AC
  Administered 2022-04-10: 180 mg via INTRAVENOUS
  Filled 2022-04-10: qty 9

## 2022-04-10 MED ORDER — HEPARIN SOD (PORK) LOCK FLUSH 100 UNIT/ML IV SOLN
500.0000 [IU] | Freq: Once | INTRAVENOUS | Status: AC | PRN
  Administered 2022-04-10: 500 [IU]

## 2022-04-10 MED ORDER — SODIUM CHLORIDE 0.9% FLUSH
10.0000 mL | INTRAVENOUS | Status: DC | PRN
  Administered 2022-04-10 (×2): 10 mL

## 2022-04-10 NOTE — Progress Notes (Signed)
Patient tolerated chemotherapy with no complaints voiced. Side effects with management reviewed understanding verbalized. Port site clean and dry with no bruising or swelling noted at site. Good blood return noted before and after administration of chemotherapy. Band aid applied. Patient left in satisfactory condition with VSS and no s/s of distress noted. 

## 2022-04-10 NOTE — Patient Instructions (Signed)
[facility]  Discharge Instructions: Thank you for choosing [facility] to provide your oncology and hematology care.  If you have a lab appointment with the [facility], please come in thru the Main Entrance and check in at the main information desk.  Wear comfortable clothing and clothing appropriate for easy access to any Portacath or PICC line.   We strive to give you quality time with your provider. You may need to reschedule your appointment if you arrive late (15 or more minutes).  Arriving late affects you and other patients whose appointments are after yours.  Also, if you miss three or more appointments without notifying the office, you may be dismissed from the clinic at the provider's discretion.      For prescription refill requests, have your pharmacy contact our office and allow 72 hours for refills to be completed.    Today you received the following chemotherapy and/or immunotherapy agents Etoposide, return as scheduled.  Etoposide, VP-16 capsules What is this medication? ETOPOSIDE, VP-16 (e toe POE side) is a chemotherapy drug. It is used to treat small cell lung cancer and other cancers. This medicine may be used for other purposes; ask your health care provider or pharmacist if you have questions. COMMON BRAND NAME(S): VePesid What should I tell my care team before I take this medication? They need to know if you have any of these conditions: infection kidney disease liver disease low blood counts, like low white cell, platelet, or red cell counts an unusual or allergic reaction to etoposide, other medicines, foods, dyes, or preservatives pregnant or trying to get pregnant breast-feeding How should I use this medication? Take this medicine by mouth with a glass of water. Follow the directions on the prescription label. Do not open, crush, or chew the capsules. It is advisable to wear gloves when handling this medicine. Take your medicine at  regular intervals. Do not take it more often than directed. Do not stop taking except on your doctor's advice. Talk to your pediatrician regarding the use of this medicine in children. Special care may be needed. Overdosage: If you think you have taken too much of this medicine contact a poison control center or emergency room at once. NOTE: This medicine is only for you. Do not share this medicine with others. What if I miss a dose? If you miss a dose, take it as soon as you can. If it is almost time for your next dose, take only that dose. Do not take double or extra doses. What may interact with this medication? This medicine may interact with the following medications: cyclosporine warfarin This list may not describe all possible interactions. Give your health care provider a list of all the medicines, herbs, non-prescription drugs, or dietary supplements you use. Also tell them if you smoke, drink alcohol, or use illegal drugs. Some items may interact with your medicine. What should I watch for while using this medication? Visit your doctor for checks on your progress. This drug may make you feel generally unwell. This is not uncommon, as chemotherapy can affect healthy cells as well as cancer cells. Report any side effects. Continue your course of treatment even though you feel ill unless your doctor tells you to stop. In some cases, you may be given additional medicines to help with side effects. Follow all directions for their use. Call your doctor or health care professional for advice if you get a fever, chills or sore throat, or other  symptoms of a cold or flu. Do not treat yourself. This drug decreases your body's ability to fight infections. Try to avoid being around people who are sick. This medicine may increase your risk to bruise or bleed. Call your doctor or health care professional if you notice any unusual bleeding. Talk to your doctor about your risk of cancer. You may be more at  risk for certain types of cancers if you take this medicine. Do not become pregnant while taking this medicine or for at least 6 months after stopping it. Women should inform their doctor if they wish to become pregnant or think they might be pregnant. Women of child-bearing potential will need to have a negative pregnancy test before starting this medicine. There is a potential for serious side effects to an unborn child. Talk to your health care professional or pharmacist for more information. Do not breast-feed an infant while taking this medicine. Men must use a latex condom during sexual contact with a woman while taking this medicine and for at least 4 months after stopping it. A latex condom is needed even if you have had a vasectomy. Contact your doctor right away if your partner becomes pregnant. Do not donate sperm while taking this medicine and for 4 months after you stop taking this medicine. Men should inform their doctors if they wish to father a child. This medicine may lower sperm counts. What side effects may I notice from receiving this medication? Side effects that you should report to your doctor or health care professional as soon as possible: allergic reactions like skin rash, itching or hives, swelling of the face, lips, or tongue low blood counts - this medicine may decrease the number of white blood cells, red blood cells, and platelets. You may be at increased risk for infections and bleeding nausea, vomiting redness, blistering, peeling or loosening of the skin, including inside the mouth signs and symptoms of infection like fever; chills; cough; sore throat; pain or trouble passing urine signs and symptoms of low red blood cells or anemia such as unusually weak or tired; feeling faint or lightheaded; falls; breathing problems unusual bruising or bleeding Side effects that usually do not require medical attention (report to your doctor or health care professional if they  continue or are bothersome): changes in taste diarrhea hair loss loss of appetite mouth sores This list may not describe all possible side effects. Call your doctor for medical advice about side effects. You may report side effects to FDA at 1-800-FDA-1088. Where should I keep my medication? Keep out of the reach of children. Store in a refrigerator between 2 and 8 degrees C (36 and 46 degrees F). Do not freeze. Throw away any unused medicine after the expiration date. NOTE: This sheet is a summary. It may not cover all possible information. If you have questions about this medicine, talk to your doctor, pharmacist, or health care provider.  2023 Elsevier/Gold Standard (2021-08-22 00:00:00)    To help prevent nausea and vomiting after your treatment, we encourage you to take your nausea medication as directed.  BELOW ARE SYMPTOMS THAT SHOULD BE REPORTED IMMEDIATELY: *FEVER GREATER THAN 100.4 F (38 C) OR HIGHER *CHILLS OR SWEATING *NAUSEA AND VOMITING THAT IS NOT CONTROLLED WITH YOUR NAUSEA MEDICATION *UNUSUAL SHORTNESS OF BREATH *UNUSUAL BRUISING OR BLEEDING *URINARY PROBLEMS (pain or burning when urinating, or frequent urination) *BOWEL PROBLEMS (unusual diarrhea, constipation, pain near the anus) TENDERNESS IN MOUTH AND THROAT WITH OR WITHOUT PRESENCE OF ULCERS (sore  throat, sores in mouth, or a toothache) UNUSUAL RASH, SWELLING OR PAIN  UNUSUAL VAGINAL DISCHARGE OR ITCHING   Items with * indicate a potential emergency and should be followed up as soon as possible or go to the Emergency Department if any problems should occur.  Please show the CHEMOTHERAPY ALERT CARD or IMMUNOTHERAPY ALERT CARD at check-in to the Emergency Department and triage nurse.  Should you have questions after your visit or need to cancel or reschedule your appointment, please contact [facility] [phone]  and follow the prompts.  Office hours are 8:00 a.m. to 4:30 p.m. Monday - Friday.  Please note that voicemails left after 4:00 p.m. may not be returned until the following business day.  We are closed weekends and major holidays. You have access to a nurse at all times for urgent questions. Please call the main number to the clinic [phone] and follow the prompts.  For any non-urgent questions, you may also contact your provider using MyChart. We now offer e-Visits for anyone 49 and older to request care online for non-urgent symptoms. For details visit [email or web address].   Also download the MyChart app! Go to the app store, search "MyChart", open the app, select [facility], and log in with your MyChart username and password.  Masks are optional in the cancer centers. If you would like for your care team to wear a mask while they are taking care of you, please let them know. For doctor visits, patients may have with them one support person who is at least 71 years old. At this time, visitors are not allowed in the infusion area.

## 2022-04-13 ENCOUNTER — Inpatient Hospital Stay (HOSPITAL_COMMUNITY): Payer: PPO

## 2022-04-13 VITALS — BP 97/72 | HR 89 | Temp 97.6°F | Resp 20

## 2022-04-13 DIAGNOSIS — Z5111 Encounter for antineoplastic chemotherapy: Secondary | ICD-10-CM | POA: Diagnosis not present

## 2022-04-13 DIAGNOSIS — C3411 Malignant neoplasm of upper lobe, right bronchus or lung: Secondary | ICD-10-CM

## 2022-04-13 MED ORDER — PEGFILGRASTIM-CBQV 6 MG/0.6ML ~~LOC~~ SOSY
6.0000 mg | PREFILLED_SYRINGE | Freq: Once | SUBCUTANEOUS | Status: AC
  Administered 2022-04-13: 6 mg via SUBCUTANEOUS
  Filled 2022-04-13: qty 0.6

## 2022-04-13 NOTE — Progress Notes (Signed)
Larry Moore presents today for Udenyca injection per the provider's orders.  Stable during administration without incident; injection site WNL; see MAR for injection details.  Patient tolerated procedure well and without incident.  No questions or complaints noted at this time. Discharge from clinic ambulatory in stable condition.  Alert and oriented X 3.  Follow up with [facility] as scheduled.

## 2022-04-13 NOTE — Patient Instructions (Signed)
[facility]  Discharge Instructions: Thank you for choosing [facility] to provide your oncology and hematology care.  If you have a lab appointment with the [facility], please come in thru the Main Entrance and check in at the main information desk.  Wear comfortable clothing and clothing appropriate for easy access to any Portacath or PICC line.   We strive to give you quality time with your provider. You may need to reschedule your appointment if you arrive late (15 or more minutes).  Arriving late affects you and other patients whose appointments are after yours.  Also, if you miss three or more appointments without notifying the office, you may be dismissed from the clinic at the provider's discretion.      For prescription refill requests, have your pharmacy contact our office and allow 72 hours for refills to be completed.    Today you received the following chemotherapy and/or immunotherapy agents Udenyca      To help prevent nausea and vomiting after your treatment, we encourage you to take your nausea medication as directed.  BELOW ARE SYMPTOMS THAT SHOULD BE REPORTED IMMEDIATELY: *FEVER GREATER THAN 100.4 F (38 C) OR HIGHER *CHILLS OR SWEATING *NAUSEA AND VOMITING THAT IS NOT CONTROLLED WITH YOUR NAUSEA MEDICATION *UNUSUAL SHORTNESS OF BREATH *UNUSUAL BRUISING OR BLEEDING *URINARY PROBLEMS (pain or burning when urinating, or frequent urination) *BOWEL PROBLEMS (unusual diarrhea, constipation, pain near the anus) TENDERNESS IN MOUTH AND THROAT WITH OR WITHOUT PRESENCE OF ULCERS (sore throat, sores in mouth, or a toothache) UNUSUAL RASH, SWELLING OR PAIN  UNUSUAL VAGINAL DISCHARGE OR ITCHING   Items with * indicate a potential emergency and should be followed up as soon as possible or go to the Emergency Department if any problems should occur.  Please show the CHEMOTHERAPY ALERT CARD or IMMUNOTHERAPY ALERT CARD at check-in to the Emergency  Department and triage nurse.  Should you have questions after your visit or need to cancel or reschedule your appointment, please contact [facility] [phone]  and follow the prompts.  Office hours are 8:00 a.m. to 4:30 p.m. Monday - Friday. Please note that voicemails left after 4:00 p.m. may not be returned until the following business day.  We are closed weekends and major holidays. You have access to a nurse at all times for urgent questions. Please call the main number to the clinic [phone] and follow the prompts.  For any non-urgent questions, you may also contact your provider using MyChart. We now offer e-Visits for anyone 23 and older to request care online for non-urgent symptoms. For details visit [email or web address].   Also download the MyChart app! Go to the app store, search "MyChart", open the app, select [facility], and log in with your MyChart username and password.  Masks are optional in the cancer centers. If you would like for your care team to wear a mask while they are taking care of you, please let them know. For doctor visits, patients may have with them one support person who is at least 71 years old. At this time, visitors are not allowed in the infusion area.

## 2022-04-27 ENCOUNTER — Ambulatory Visit (HOSPITAL_COMMUNITY)
Admission: RE | Admit: 2022-04-27 | Discharge: 2022-04-27 | Disposition: A | Discharge status: Home / Self Care | Payer: PPO | Source: Ambulatory Visit | Attending: Hematology | Admitting: Hematology

## 2022-04-27 ENCOUNTER — Encounter (HOSPITAL_COMMUNITY): Payer: Self-pay

## 2022-04-27 ENCOUNTER — Other Ambulatory Visit: Payer: Self-pay

## 2022-04-27 DIAGNOSIS — C3491 Malignant neoplasm of unspecified part of right bronchus or lung: Secondary | ICD-10-CM

## 2022-04-28 ENCOUNTER — Ambulatory Visit (HOSPITAL_COMMUNITY)
Admission: RE | Admit: 2022-04-28 | Discharge: 2022-04-28 | Disposition: A | Discharge status: Home / Self Care | Payer: PPO | Source: Ambulatory Visit | Attending: Hematology | Admitting: Hematology

## 2022-04-28 DIAGNOSIS — C3490 Malignant neoplasm of unspecified part of unspecified bronchus or lung: Secondary | ICD-10-CM | POA: Diagnosis not present

## 2022-04-28 DIAGNOSIS — C3491 Malignant neoplasm of unspecified part of right bronchus or lung: Secondary | ICD-10-CM | POA: Diagnosis not present

## 2022-04-28 DIAGNOSIS — I3139 Other pericardial effusion (noninflammatory): Secondary | ICD-10-CM | POA: Diagnosis not present

## 2022-04-28 DIAGNOSIS — R590 Localized enlarged lymph nodes: Secondary | ICD-10-CM | POA: Diagnosis not present

## 2022-04-28 DIAGNOSIS — J8410 Pulmonary fibrosis, unspecified: Secondary | ICD-10-CM | POA: Diagnosis not present

## 2022-04-28 DIAGNOSIS — K769 Liver disease, unspecified: Secondary | ICD-10-CM | POA: Diagnosis not present

## 2022-04-28 DIAGNOSIS — J439 Emphysema, unspecified: Secondary | ICD-10-CM | POA: Diagnosis not present

## 2022-04-28 DIAGNOSIS — K5730 Diverticulosis of large intestine without perforation or abscess without bleeding: Secondary | ICD-10-CM | POA: Diagnosis not present

## 2022-04-28 MED ORDER — IOHEXOL 300 MG/ML  SOLN
100.0000 mL | Freq: Once | INTRAMUSCULAR | Status: AC | PRN
  Administered 2022-04-28: 100 mL via INTRAVENOUS

## 2022-04-28 MED ORDER — HEPARIN SOD (PORK) LOCK FLUSH 100 UNIT/ML IV SOLN: 500.0000 [IU] | Freq: Once | INTRAVENOUS | Status: AC

## 2022-04-28 MED ORDER — HEPARIN SOD (PORK) LOCK FLUSH 100 UNIT/ML IV SOLN
INTRAVENOUS | Status: AC
  Administered 2022-04-28: 500 [IU] via INTRAVENOUS
  Filled 2022-04-28: qty 5

## 2022-04-29 ENCOUNTER — Inpatient Hospital Stay (HOSPITAL_COMMUNITY): Payer: PPO

## 2022-04-29 ENCOUNTER — Inpatient Hospital Stay (HOSPITAL_BASED_OUTPATIENT_CLINIC_OR_DEPARTMENT_OTHER): Payer: PPO | Admitting: Hematology

## 2022-04-29 VITALS — Wt 236.0 lb

## 2022-04-29 VITALS — BP 104/69 | HR 76 | Temp 97.1°F | Resp 20

## 2022-04-29 DIAGNOSIS — Z5111 Encounter for antineoplastic chemotherapy: Secondary | ICD-10-CM | POA: Diagnosis not present

## 2022-04-29 DIAGNOSIS — C3411 Malignant neoplasm of upper lobe, right bronchus or lung: Secondary | ICD-10-CM | POA: Diagnosis not present

## 2022-04-29 DIAGNOSIS — E876 Hypokalemia: Secondary | ICD-10-CM

## 2022-04-29 LAB — CBC WITH DIFFERENTIAL/PLATELET
Abs Immature Granulocytes: 0.53 10*3/uL — ABNORMAL HIGH (ref 0.00–0.07)
Basophils Absolute: 0.1 10*3/uL (ref 0.0–0.1)
Basophils Relative: 1 %
Eosinophils Absolute: 0 10*3/uL (ref 0.0–0.5)
Eosinophils Relative: 0 %
HCT: 35.2 % — ABNORMAL LOW (ref 39.0–52.0)
Hemoglobin: 11.6 g/dL — ABNORMAL LOW (ref 13.0–17.0)
Immature Granulocytes: 5 %
Lymphocytes Relative: 7 %
Lymphs Abs: 0.7 10*3/uL (ref 0.7–4.0)
MCH: 32 pg (ref 26.0–34.0)
MCHC: 33 g/dL (ref 30.0–36.0)
MCV: 97.2 fL (ref 80.0–100.0)
Monocytes Absolute: 0.7 10*3/uL (ref 0.1–1.0)
Monocytes Relative: 7 %
Neutro Abs: 8.1 10*3/uL — ABNORMAL HIGH (ref 1.7–7.7)
Neutrophils Relative %: 80 %
Platelets: 278 10*3/uL (ref 150–400)
RBC: 3.62 MIL/uL — ABNORMAL LOW (ref 4.22–5.81)
RDW: 19.7 % — ABNORMAL HIGH (ref 11.5–15.5)
WBC: 10.2 10*3/uL (ref 4.0–10.5)
nRBC: 0.3 % — ABNORMAL HIGH (ref 0.0–0.2)

## 2022-04-29 LAB — COMPREHENSIVE METABOLIC PANEL WITH GFR
ALT: 11 U/L (ref 0–44)
AST: 13 U/L — ABNORMAL LOW (ref 15–41)
Albumin: 3.4 g/dL — ABNORMAL LOW (ref 3.5–5.0)
Alkaline Phosphatase: 79 U/L (ref 38–126)
Anion gap: 9 (ref 5–15)
BUN: 7 mg/dL — ABNORMAL LOW (ref 8–23)
CO2: 32 mmol/L (ref 22–32)
Calcium: 8.1 mg/dL — ABNORMAL LOW (ref 8.9–10.3)
Chloride: 97 mmol/L — ABNORMAL LOW (ref 98–111)
Creatinine, Ser: 0.72 mg/dL (ref 0.61–1.24)
GFR, Estimated: >60 mL/min
Glucose, Bld: 103 mg/dL — ABNORMAL HIGH (ref 70–99)
Potassium: 2.9 mmol/L — ABNORMAL LOW (ref 3.5–5.1)
Sodium: 138 mmol/L (ref 135–145)
Total Bilirubin: 0.7 mg/dL (ref 0.3–1.2)
Total Protein: 6.4 g/dL — ABNORMAL LOW (ref 6.5–8.1)

## 2022-04-29 LAB — MAGNESIUM: Magnesium: 1.7 mg/dL (ref 1.7–2.4)

## 2022-04-29 MED ORDER — POTASSIUM CHLORIDE CRYS ER 20 MEQ PO TBCR
40.0000 meq | EXTENDED_RELEASE_TABLET | Freq: Once | ORAL | Status: AC
  Administered 2022-04-29: 40 meq via ORAL
  Filled 2022-04-29: qty 2

## 2022-04-29 MED ORDER — SODIUM CHLORIDE 0.9% FLUSH
10.0000 mL | INTRAVENOUS | Status: DC | PRN
  Administered 2022-04-29: 10 mL

## 2022-04-29 MED ORDER — SODIUM CHLORIDE 0.9 % IV SOLN: Freq: Once | INTRAVENOUS | Status: AC

## 2022-04-29 MED ORDER — SODIUM CHLORIDE 0.9 % IV SOLN
508.8000 mg | Freq: Once | INTRAVENOUS | Status: AC
  Administered 2022-04-29: 510 mg via INTRAVENOUS
  Filled 2022-04-29: qty 51

## 2022-04-29 MED ORDER — HEPARIN SOD (PORK) LOCK FLUSH 100 UNIT/ML IV SOLN
500.0000 [IU] | Freq: Once | INTRAVENOUS | Status: AC | PRN
  Administered 2022-04-29: 500 [IU]

## 2022-04-29 MED ORDER — SODIUM CHLORIDE 0.9 % IV SOLN
1200.0000 mg | Freq: Once | INTRAVENOUS | Status: AC
  Administered 2022-04-29: 1200 mg via INTRAVENOUS
  Filled 2022-04-29: qty 20

## 2022-04-29 MED ORDER — PALONOSETRON HCL INJECTION 0.25 MG/5ML
0.2500 mg | Freq: Once | INTRAVENOUS | Status: AC
  Administered 2022-04-29: 0.25 mg via INTRAVENOUS
  Filled 2022-04-29: qty 5

## 2022-04-29 MED ORDER — POTASSIUM CHLORIDE 10 MEQ/100ML IV SOLN
10.0000 meq | INTRAVENOUS | Status: AC
  Administered 2022-04-29 (×2): 10 meq via INTRAVENOUS
  Filled 2022-04-29 (×2): qty 100

## 2022-04-29 MED ORDER — SODIUM CHLORIDE 0.9 % IV SOLN
80.0000 mg/m2 | Freq: Once | INTRAVENOUS | Status: AC
  Administered 2022-04-29: 180 mg via INTRAVENOUS
  Filled 2022-04-29: qty 9

## 2022-04-29 MED ORDER — DEXAMETHASONE SODIUM PHOSPHATE 10 MG/ML IJ SOLN
5.0000 mg | Freq: Once | INTRAMUSCULAR | Status: AC
  Administered 2022-04-29: 5 mg via INTRAVENOUS
  Filled 2022-04-29: qty 0.5

## 2022-04-29 MED ORDER — SODIUM CHLORIDE 0.9 % IV SOLN
150.0000 mg | Freq: Once | INTRAVENOUS | Status: AC
  Administered 2022-04-29: 150 mg via INTRAVENOUS
  Filled 2022-04-29: qty 5

## 2022-04-29 NOTE — Patient Instructions (Signed)
[place]  Discharge Instructions: Thank you for choosing [facility] to provide your oncology and hematology care.  If you have a lab appointment with the [facility], please come in thru the Main Entrance and check in at the main information desk.  Wear comfortable clothing and clothing appropriate for easy access to any Portacath or PICC line.   We strive to give you quality time with your provider. You may need to reschedule your appointment if you arrive late (15 or more minutes).  Arriving late affects you and other patients whose appointments are after yours.  Also, if you miss three or more appointments without notifying the office, you may be dismissed from the clinic at the provider's discretion.      For prescription refill requests, have your pharmacy contact our office and allow 72 hours for refills to be completed.    Today you received the following chemotherapy and/or immunotherapy agents Tecentriq/Carboplatin/Etoposide.  Etoposide, VP-16 injection What is this medication? ETOPOSIDE, VP-16 (e toe POE side) is a chemotherapy drug. It is used to treat testicular cancer, lung cancer, and other cancers. This medicine may be used for other purposes; ask your health care provider or pharmacist if you have questions. COMMON BRAND NAME(S): Etopophos, Toposar, VePesid What should I tell my care team before I take this medication? They need to know if you have any of these conditions: infection kidney disease liver disease low blood counts, like low white cell, platelet, or red cell counts an unusual or allergic reaction to etoposide, other medicines, foods, dyes, or preservatives pregnant or trying to get pregnant breast-feeding How should I use this medication? This medicine is for infusion into a vein. It is administered in a hospital or clinic by a specially trained health care professional. Talk to your pediatrician regarding the use of this  medicine in children. Special care may be needed. Overdosage: If you think you have taken too much of this medicine contact a poison control center or emergency room at once. NOTE: This medicine is only for you. Do not share this medicine with others. What if I miss a dose? It is important not to miss your dose. Call your doctor or health care professional if you are unable to keep an appointment. What may interact with this medication? This medicine may interact with the following medications: warfarin This list may not describe all possible interactions. Give your health care provider a list of all the medicines, herbs, non-prescription drugs, or dietary supplements you use. Also tell them if you smoke, drink alcohol, or use illegal drugs. Some items may interact with your medicine. What should I watch for while using this medication? Visit your doctor for checks on your progress. This drug may make you feel generally unwell. This is not uncommon, as chemotherapy can affect healthy cells as well as cancer cells. Report any side effects. Continue your course of treatment even though you feel ill unless your doctor tells you to stop. In some cases, you may be given additional medicines to help with side effects. Follow all directions for their use. Call your doctor or health care professional for advice if you get a fever, chills or sore throat, or other symptoms of a cold or flu. Do not treat yourself. This drug decreases your body's ability to fight infections. Try to avoid being around people who are sick. This medicine may increase your risk to bruise or bleed. Call your doctor or health care professional if  you notice any unusual bleeding. Talk to your doctor about your risk of cancer. You may be more at risk for certain types of cancers if you take this medicine. Do not become pregnant while taking this medicine or for at least 6 months after stopping it. Women should inform their doctor if they  wish to become pregnant or think they might be pregnant. Women of child-bearing potential will need to have a negative pregnancy test before starting this medicine. There is a potential for serious side effects to an unborn child. Talk to your health care professional or pharmacist for more information. Do not breast-feed an infant while taking this medicine. Men must use a latex condom during sexual contact with a woman while taking this medicine and for at least 4 months after stopping it. A latex condom is needed even if you have had a vasectomy. Contact your doctor right away if your partner becomes pregnant. Do not donate sperm while taking this medicine and for at least 4 months after you stop taking this medicine. Men should inform their doctors if they wish to father a child. This medicine may lower sperm counts. What side effects may I notice from receiving this medication? Side effects that you should report to your doctor or health care professional as soon as possible: allergic reactions like skin rash, itching or hives, swelling of the face, lips, or tongue low blood counts - this medicine may decrease the number of white blood cells, red blood cells, and platelets. You may be at increased risk for infections and bleeding nausea, vomiting redness, blistering, peeling or loosening of the skin, including inside the mouth signs and symptoms of infection like fever; chills; cough; sore throat; pain or trouble passing urine signs and symptoms of low red blood cells or anemia such as unusually weak or tired; feeling faint or lightheaded; falls; breathing problems unusual bruising or bleeding Side effects that usually do not require medical attention (report to your doctor or health care professional if they continue or are bothersome): changes in taste diarrhea hair loss loss of appetite mouth sores This list may not describe all possible side effects. Call your doctor for medical advice  about side effects. You may report side effects to FDA at 1-800-FDA-1088. Where should I keep my medication? This drug is given in a hospital or clinic and will not be stored at home. NOTE: This sheet is a summary. It may not cover all possible information. If you have questions about this medicine, talk to your doctor, pharmacist, or health care provider.  2023 Elsevier/Gold Standard (2021-08-22 00:00:00)   Carboplatin injection What is this medication? CARBOPLATIN (KAR boe pla tin) is a chemotherapy drug. It targets fast dividing cells, like cancer cells, and causes these cells to die. This medicine is used to treat ovarian cancer and many other cancers. This medicine may be used for other purposes; ask your health care provider or pharmacist if you have questions. COMMON BRAND NAME(S): Paraplatin What should I tell my care team before I take this medication? They need to know if you have any of these conditions: blood disorders hearing problems kidney disease recent or ongoing radiation therapy an unusual or allergic reaction to carboplatin, cisplatin, other chemotherapy, other medicines, foods, dyes, or preservatives pregnant or trying to get pregnant breast-feeding How should I use this medication? This drug is usually given as an infusion into a vein. It is administered in a hospital or clinic by a specially trained health care professional.  Talk to your pediatrician regarding the use of this medicine in children. Special care may be needed. Overdosage: If you think you have taken too much of this medicine contact a poison control center or emergency room at once. NOTE: This medicine is only for you. Do not share this medicine with others. What if I miss a dose? It is important not to miss a dose. Call your doctor or health care professional if you are unable to keep an appointment. What may interact with this medication? medicines for seizures medicines to increase blood counts  like filgrastim, pegfilgrastim, sargramostim some antibiotics like amikacin, gentamicin, neomycin, streptomycin, tobramycin vaccines Talk to your doctor or health care professional before taking any of these medicines: acetaminophen aspirin ibuprofen ketoprofen naproxen This list may not describe all possible interactions. Give your health care provider a list of all the medicines, herbs, non-prescription drugs, or dietary supplements you use. Also tell them if you smoke, drink alcohol, or use illegal drugs. Some items may interact with your medicine. What should I watch for while using this medication? Your condition will be monitored carefully while you are receiving this medicine. You will need important blood work done while you are taking this medicine. This drug may make you feel generally unwell. This is not uncommon, as chemotherapy can affect healthy cells as well as cancer cells. Report any side effects. Continue your course of treatment even though you feel ill unless your doctor tells you to stop. In some cases, you may be given additional medicines to help with side effects. Follow all directions for their use. Call your doctor or health care professional for advice if you get a fever, chills or sore throat, or other symptoms of a cold or flu. Do not treat yourself. This drug decreases your body's ability to fight infections. Try to avoid being around people who are sick. This medicine may increase your risk to bruise or bleed. Call your doctor or health care professional if you notice any unusual bleeding. Be careful brushing and flossing your teeth or using a toothpick because you may get an infection or bleed more easily. If you have any dental work done, tell your dentist you are receiving this medicine. Avoid taking products that contain aspirin, acetaminophen, ibuprofen, naproxen, or ketoprofen unless instructed by your doctor. These medicines may hide a fever. Do not become  pregnant while taking this medicine. Women should inform their doctor if they wish to become pregnant or think they might be pregnant. There is a potential for serious side effects to an unborn child. Talk to your health care professional or pharmacist for more information. Do not breast-feed an infant while taking this medicine. What side effects may I notice from receiving this medication? Side effects that you should report to your doctor or health care professional as soon as possible: allergic reactions like skin rash, itching or hives, swelling of the face, lips, or tongue signs of infection - fever or chills, cough, sore throat, pain or difficulty passing urine signs of decreased platelets or bleeding - bruising, pinpoint red spots on the skin, black, tarry stools, nosebleeds signs of decreased red blood cells - unusually weak or tired, fainting spells, lightheadedness breathing problems changes in hearing changes in vision chest pain high blood pressure low blood counts - This drug may decrease the number of white blood cells, red blood cells and platelets. You may be at increased risk for infections and bleeding. nausea and vomiting pain, swelling, redness or irritation at  the injection site pain, tingling, numbness in the hands or feet problems with balance, talking, walking trouble passing urine or change in the amount of urine Side effects that usually do not require medical attention (report to your doctor or health care professional if they continue or are bothersome): hair loss loss of appetite metallic taste in the mouth or changes in taste This list may not describe all possible side effects. Call your doctor for medical advice about side effects. You may report side effects to FDA at 1-800-FDA-1088. Where should I keep my medication? This drug is given in a hospital or clinic and will not be stored at home. NOTE: This sheet is a summary. It may not cover all possible  information. If you have questions about this medicine, talk to your doctor, pharmacist, or health care provider.  2023 Elsevier/Gold Standard (2008-02-29 00:00:00)   Atezolizumab injection What is this medication? ATEZOLIZUMAB (a te zoe LIZ ue mab) is a monoclonal antibody. It is used to treat bladder cancer (urothelial cancer), liver cancer, lung cancer, and melanoma. This medicine may be used for other purposes; ask your health care provider or pharmacist if you have questions. COMMON BRAND NAME(S): Tecentriq What should I tell my care team before I take this medication? They need to know if you have any of these conditions: autoimmune diseases like Crohn's disease, ulcerative colitis, or lupus have had or planning to have an allogeneic stem cell transplant (uses someone else's stem cells) history of organ transplant history of radiation to the chest nervous system problems like myasthenia gravis or Guillain-Barre syndrome an unusual or allergic reaction to atezolizumab, other medicines, foods, dyes, or preservatives pregnant or trying to get pregnant breast-feeding How should I use this medication? This medicine is for infusion into a vein. It is given by a health care professional in a hospital or clinic setting. A special MedGuide will be given to you before each treatment. Be sure to read this information carefully each time. Talk to your pediatrician regarding the use of this medicine in children. Special care may be needed. Overdosage: If you think you have taken too much of this medicine contact a poison control center or emergency room at once. NOTE: This medicine is only for you. Do not share this medicine with others. What if I miss a dose? It is important not to miss your dose. Call your doctor or health care professional if you are unable to keep an appointment. What may interact with this medication? Interactions have not been studied. This list may not describe all  possible interactions. Give your health care provider a list of all the medicines, herbs, non-prescription drugs, or dietary supplements you use. Also tell them if you smoke, drink alcohol, or use illegal drugs. Some items may interact with your medicine. What should I watch for while using this medication? Your condition will be monitored carefully while you are receiving this medicine. You may need blood work done while you are taking this medicine. Do not become pregnant while taking this medicine or for at least 5 months after stopping it. Women should inform their doctor if they wish to become pregnant or think they might be pregnant. There is a potential for serious side effects to an unborn child. Talk to your health care professional or pharmacist for more information. Do not breast-feed an infant while taking this medicine or for at least 5 months after the last dose. What side effects may I notice from receiving this medication? Side  effects that you should report to your doctor or health care professional as soon as possible: allergic reactions like skin rash, itching or hives, swelling of the face, lips, or tongue black, tarry stools bloody or watery diarrhea breathing problems changes in vision chest pain or chest tightness chills facial flushing fever headache signs and symptoms of high blood sugar such as dizziness; dry mouth; dry skin; fruity breath; nausea; stomach pain; increased hunger or thirst; increased urination signs and symptoms of liver injury like dark yellow or Rakeya Glab urine; general ill feeling or flu-like symptoms; light-colored stools; loss of appetite; nausea; right upper belly pain; unusually weak or tired; yellowing of the eyes or skin stomach pain trouble passing urine or change in the amount of urine Side effects that usually do not require medical attention (report to your doctor or health care professional if they continue or are bothersome): bone  pain cough diarrhea joint pain muscle pain muscle weakness swelling of arms or legs tiredness weight loss This list may not describe all possible side effects. Call your doctor for medical advice about side effects. You may report side effects to FDA at 1-800-FDA-1088. Where should I keep my medication? This drug is given in a hospital or clinic and will not be stored at home. NOTE: This sheet is a summary. It may not cover all possible information. If you have questions about this medicine, talk to your doctor, pharmacist, or health care provider.  2023 Elsevier/Gold Standard (2021-08-22 00:00:00)        To help prevent nausea and vomiting after your treatment, we encourage you to take your nausea medication as directed.  BELOW ARE SYMPTOMS THAT SHOULD BE REPORTED IMMEDIATELY: *FEVER GREATER THAN 100.4 F (38 C) OR HIGHER *CHILLS OR SWEATING *NAUSEA AND VOMITING THAT IS NOT CONTROLLED WITH YOUR NAUSEA MEDICATION *UNUSUAL SHORTNESS OF BREATH *UNUSUAL BRUISING OR BLEEDING *URINARY PROBLEMS (pain or burning when urinating, or frequent urination) *BOWEL PROBLEMS (unusual diarrhea, constipation, pain near the anus) TENDERNESS IN MOUTH AND THROAT WITH OR WITHOUT PRESENCE OF ULCERS (sore throat, sores in mouth, or a toothache) UNUSUAL RASH, SWELLING OR PAIN  UNUSUAL VAGINAL DISCHARGE OR ITCHING   Items with * indicate a potential emergency and should be followed up as soon as possible or go to the Emergency Department if any problems should occur.  Please show the CHEMOTHERAPY ALERT CARD or IMMUNOTHERAPY ALERT CARD at check-in to the Emergency Department and triage nurse.  Should you have questions after your visit or need to cancel or reschedule your appointment, please contact [facility] [phone]  and follow the prompts.  Office hours are 8:00 a.m. to 4:30 p.m. Monday - Friday. Please note that voicemails left after 4:00 p.m. may not be returned until the following  business day.  We are closed weekends and major holidays. You have access to a nurse at all times for urgent questions. Please call the main number to the clinic [phone] and follow the prompts.  For any non-urgent questions, you may also contact your provider using MyChart. We now offer e-Visits for anyone 33 and older to request care online for non-urgent symptoms. For details visit [email or web address].   Also download the MyChart app! Go to the app store, search "MyChart", open the app, select [facility], and log in with your MyChart username and password.  Masks are optional in the cancer centers. If you would like for your care team to wear a mask while they are taking care of you, please let  them know. For doctor visits, patients may have with them one support person who is at least 71 years old. At this time, visitors are not allowed in the infusion area.

## 2022-04-29 NOTE — Patient Instructions (Addendum)
[place] at [facility] Discharge Instructions   You were seen and examined today by Dr. Delton Coombes.  He reviewed the results of your lab work. Your potassium remains low at 2.9. We will give IV potassium today.  He reviewed the results of your CT scan which shows you've had an excellent response to treatment.   We will proceed with cycle 4 of your treatment today. After today, we will discontinue chemotherapy and treat you with Tecentriq only every 3 weeks.   Return as scheduled in 3 weeks.    Thank you for choosing [place] at [facility] to provide your oncology and hematology care.  To afford each patient quality time with our provider, please arrive at least 15 minutes before your scheduled appointment time.   If you have a lab appointment with the [facility] please come in thru the Main Entrance and check in at the main information desk.  You need to re-schedule your appointment should you arrive 10 or more minutes late.  We strive to give you quality time with our providers, and arriving late affects you and other patients whose appointments are after yours.  Also, if you no show three or more times for appointments you may be dismissed from the clinic at the providers discretion.     Again, thank you for choosing [facility].  Our hope is that these requests will decrease the amount of time that you wait before being seen by our physicians.       _____________________________________________________________  Should you have questions after your visit to [facility], please contact our office at [phone] and follow the prompts.  Our office hours are 8:00 a.m. and 4:30 p.m. Monday - Friday.  Please note that voicemails left after 4:00 p.m. may not be returned until the following business day.  We are closed weekends and major holidays.  You do have access to a nurse 24-7, just call the main number to the  clinic [phone] and do not press any options, hold on the line and a nurse will answer the phone.    For prescription refill requests, have your pharmacy contact our office and allow 72 hours.    Due to Covid, you will need to wear a mask upon entering the hospital. If you do not have a mask, a mask will be given to you at the Main Entrance upon arrival. For doctor visits, patients may have 1 support person age 74 or older with them. For treatment visits, patients can not have anyone with them due to social distancing guidelines and our immunocompromised population.

## 2022-04-29 NOTE — Progress Notes (Signed)
Larry Moore, [place] [id number]   CLINIC:  Medical Oncology/Hematology  PCP:  [facility], [place] / [place] [address] [phone]   REASON FOR VISIT:  Follow-up for right lung mass and mediastinal lymphadenopathy  PRIOR THERAPY: none  NGS Results: not done  CURRENT THERAPY: Carboplatin + Etoposide + Atezolizumab Induction q21d / Atezolizumab   BRIEF ONCOLOGIC HISTORY:  Oncology History  Small cell lung cancer, right upper lobe ([facility])  01/22/2022 Initial Diagnosis   Small cell lung cancer, right upper lobe ([facility])   01/22/2022 Cancer Staging   Staging form: Lung, AJCC 8th Edition - Clinical stage from 01/22/2022: Stage IVB (cT4, cN2, pM1c) - Signed by [name], MD on 02/16/2022 Histopathologic type: Small cell carcinoma, NOS Stage prefix: Initial diagnosis   02/23/2022 -  Chemotherapy   Patient is on Treatment Plan : LUNG SCLC Carboplatin + Etoposide + Atezolizumab Induction q21d / Atezolizumab Maintenance q21d       CANCER STAGING:  Cancer Staging  Small cell lung cancer, right upper lobe ([facility]) Staging form: Lung, AJCC 8th Edition - Clinical stage from 01/22/2022: Stage IVB (cT4, cN2, pM1c) - Signed by [name], MD on 02/16/2022   INTERVAL HISTORY:  [name], a 71 y.o. male, returns for routine follow-up and consideration for next cycle of chemotherapy. Larry Moore was last seen on 070/02/2022.  Due for cycle #4 of Carboplatin + Etoposide + Atezolizumab today.   Overall, he tells me he has been feeling pretty well. He reports he wakes up around 1 AM and naps throughout the day. His appetite and taste are good, and he denies trouble swallowing. He denies new pains. His energy is good in the mornings and decreases throughout the day. He is staying active. He continues to take Potassium. He is taking Lasix 1-2 times weekly. He denies diarrhea.   Overall, he feels ready for next cycle  of chemo today.    REVIEW OF SYSTEMS:  Review of Systems  Constitutional:  Negative for appetite change and fatigue.  HENT:   Negative for trouble swallowing.   Cardiovascular:  Negative for palpitations.  Gastrointestinal:  Negative for diarrhea.  Psychiatric/Behavioral:  Positive for sleep disturbance.   All other systems reviewed and are negative.   PAST MEDICAL/SURGICAL HISTORY:  Past Medical History:  Diagnosis Date   Dyspnea    Dysrhythmia    hx a-fib   Hypertension    Insomnia    Pre-diabetes    Sleep apnea    Past Surgical History:  Procedure Laterality Date   ATRIAL FIBRILLATION ABLATION     approx 2020   CARDIAC CATHETERIZATION     HERNIA REPAIR     IR IMAGING GUIDED PORT INSERTION  02/17/2022   IR VENO/JUGULAR RIGHT  02/17/2022   VIDEO BRONCHOSCOPY WITH ENDOBRONCHIAL ULTRASOUND Bilateral 01/29/2022   Procedure: VIDEO BRONCHOSCOPY WITH ENDOBRONCHIAL ULTRASOUND;  Surgeon: Garner Nash, DO;  Location: [facility];  Service: Cardiopulmonary;  Laterality: Bilateral;  w/ Guardant [id number]    SOCIAL HISTORY:  Social History   Socioeconomic History   Marital status: Married    Spouse name: Not on file   Number of children: Not on file   Years of education: Not on file   Highest education level: Not on file  Occupational History   Occupation: Retired  Tobacco Use   Smoking status: Every Day    Types: Cigarettes    Passive exposure: Current   Smokeless tobacco: Not on  file   Tobacco comments:    Pt smokes 1 ppd. 01/26/22  Vaping Use   Vaping Use: Never used  Substance and Sexual Activity   Alcohol use: Not Currently    Comment: 1 bottle wine a day    Drug use: No   Sexual activity: Not on file  Other Topics Concern   Not on file  Social History Narrative   Not on file   Social Determinants of Health   Financial Resource Strain: Not on file  Food Insecurity: Not on file  Transportation Needs: Not on file  Physical Activity: Not on file  Stress: Not on  file  Social Connections: Not on file  Intimate Partner Violence: Not on file    FAMILY HISTORY:  Family History  Problem Relation Age of Onset   Coronary artery disease Father    Diabetes Father     CURRENT MEDICATIONS:  Current Outpatient Medications  Medication Sig Dispense Refill   ALPRAZolam (XANAX) 1 MG tablet Take 1 tablet (1 mg total) by mouth at bedtime as needed for anxiety. 30 tablet 3   Atezolizumab (TECENTRIQ IV) Inject into the vein every 21 ( twenty-one) days.     baclofen (LIORESAL) 10 MG tablet Take 1-2 tablets (10-20 mg total) by mouth 3 (three) times daily. 180 each 0   CARBOPLATIN IV Inject into the vein every 21 ( twenty-one) days.     ETOPOSIDE IV Inject into the vein every 21 ( twenty-one) days. Days 1-3 every 21 days     furosemide (LASIX) 80 MG tablet Take 80 mg by mouth daily.     lidocaine (XYLOCAINE) 2 % solution Use as directed 15 mLs in the mouth or throat as needed for mouth pain (prior to meals/supplements to decrease pain with swallowing). Mix 1:1 with Maalox 100 mL 6   lidocaine-prilocaine (EMLA) cream Apply topically as directed.     metolazone (ZAROXOLYN) 2.5 MG tablet Take 2.5 mg by mouth daily.     nystatin (MYCOSTATIN) 100000 UNIT/ML suspension Take 5 mLs (500,000 Units total) by mouth 4 (four) times daily. 120 mL 3   ondansetron (ZOFRAN) 8 MG tablet Take 1 tablet (8 mg total) by mouth every 8 (eight) hours as needed for refractory nausea / vomiting. Start on day 3 after carboplatin chemo. 30 tablet 1   prochlorperazine (COMPAZINE) 10 MG tablet Take 1 tablet (10 mg total) by mouth every 6 (six) hours as needed (Nausea or vomiting). 60 tablet 3   sotalol (BETAPACE) 80 MG tablet SMARTSIG:1 Tablet(s) By Mouth Every 12 Hours     sucralfate (CARAFATE) 1 GM/10ML suspension TAKE 10MLS BY MOUTH 4 TIMES DAILY AS NEEDED. 480 mL 6   Vitamin D, Ergocalciferol, (DRISDOL) 1.25 MG (50000 UNIT) CAPS capsule Take 50,000 Units by mouth daily.     No current  facility-administered medications for this visit.    ALLERGIES:  No Known Allergies  PHYSICAL EXAM:  Performance status (ECOG): 1 - Symptomatic but completely ambulatory  There were no vitals filed for this visit. Wt Readings from Last 3 Encounters:  04/08/22 235 lb 3.2 oz (106.7 kg)  03/16/22 233 lb 6.4 oz (105.9 kg)  02/23/22 241 lb 9.6 oz (109.6 kg)   Physical Exam Vitals reviewed.  Constitutional:      Appearance: Normal appearance.  Cardiovascular:     Rate and Rhythm: Normal rate and regular rhythm.     Pulses: Normal pulses.     Heart sounds: Normal heart sounds.  Pulmonary:  Effort: Pulmonary effort is normal.     Breath sounds: Normal breath sounds.  Neurological:     General: No focal deficit present.     Mental Status: He is alert and oriented to person, place, and time.  Psychiatric:        Mood and Affect: Mood normal.        Behavior: Behavior normal.     LABORATORY DATA:  I have reviewed the labs as listed.     Latest Ref Rng & Units 04/08/2022    8:17 AM 03/16/2022    9:23 AM 03/05/2022    9:10 AM  CBC  WBC 4.0 - 10.5 K/uL 12.7  8.9  0.9   Hemoglobin 13.0 - 17.0 g/dL 11.8  14.0  13.8   Hematocrit 39.0 - 52.0 % 35.5  41.2  40.1   Platelets 150 - 400 K/uL 415  376  47       Latest Ref Rng & Units 04/08/2022    8:17 AM 03/16/2022    9:23 AM 03/05/2022    9:10 AM  CMP  Glucose 70 - 99 mg/dL 94  136  102   BUN 8 - 23 mg/dL 16  9  11    Creatinine 0.61 - 1.24 mg/dL 0.87  0.92  0.72   Sodium 135 - 145 mmol/L 138  135  133   Potassium 3.5 - 5.1 mmol/L 2.8  2.6  2.9   Chloride 98 - 111 mmol/L 99  94  95   CO2 22 - 32 mmol/L 29  32  31   Calcium 8.9 - 10.3 mg/dL 7.7  8.1  8.1   Total Protein 6.5 - 8.1 g/dL 6.1  6.8  6.4   Total Bilirubin 0.3 - 1.2 mg/dL 0.2  0.6  1.1   Alkaline Phos 38 - 126 U/L 69  79  66   AST 15 - 41 U/L 12  17  10    ALT 0 - 44 U/L 9  13  11      DIAGNOSTIC IMAGING:  I have independently reviewed the scans and discussed with the  patient. CT CHEST ABDOMEN PELVIS W CONTRAST  Result Date: 04/28/2022 CLINICAL DATA:  Restaging small cell lung cancer. * Tracking Code: BO * EXAM: CT CHEST, ABDOMEN, AND PELVIS WITH CONTRAST TECHNIQUE: Multidetector CT imaging of the chest, abdomen and pelvis was performed following the standard protocol during bolus administration of intravenous contrast. RADIATION DOSE REDUCTION: This exam was performed according to the departmental dose-optimization program which includes automated exposure control, adjustment of the mA and/or kV according to patient size and/or use of iterative reconstruction technique. CONTRAST:  139mL OMNIPAQUE IOHEXOL 300 MG/ML  SOLN COMPARISON:  PET-CT 02/05/2022 and prior chest CT 01/21/2022 FINDINGS: CT CHEST FINDINGS Cardiovascular: The heart is normal in size. Small stable pericardial effusion. The aorta is within normal limits in caliber. Stable atherosclerotic calcifications. No dissection. Stable significant three-vessel coronary artery calcifications. Mediastinum/Nodes: Much improved CT appearance of the mediastinal and hilar lymphadenopathy. Right paratracheal node the prior study measures 17 mm and now measures 8 mm lower. The lower right paratracheal node previously measured 17 mm and now measures 4 mm. Residual right hilar node measures 17.5 mm on image number 26/2. This was previously a large masslike area measuring 6.5 x 5.2 cm. The subcarinal adenopathy is significantly improved. There is a small residual node measuring 9 mm which previously measured 24 mm. The esophagus is grossly. Lungs/Pleura: Mild residual right lower lobe peribronchial thickening but no persistent  mass. The more peripheral hypermetabolic lesion in the right lower lobe seen on the prior CT and PET-CT is also much improved. This previously measured approximately 4.7 x 3.2 cm and now measures 2.9 x 0.9 cm. No new pulmonary nodules to suggest pulmonary metastatic disease. Stable calcified granuloma in the  right upper lobe. Stable underlying emphysematous changes and pulmonary scarring. No pleural effusions or pleural nodules. Musculoskeletal: No chest wall mass, supraclavicular or axillary adenopathy. The left-sided power port is in good position. CT ABDOMEN PELVIS FINDINGS Hepatobiliary: Stable small low-attenuation liver lesions consistent with benign cysts. No worrisome hepatic lesions to suggest metastatic disease. No intrahepatic biliary dilatation. The gallbladder is unremarkable. No common bile duct dilatation. Pancreas: No mass, inflammation or ductal dilatation. Spleen: Normal size.  No focal lesions. Adrenals/Urinary Tract: The hypermetabolic focus projecting off the lateral limb of the left adrenal gland has resolved. Stable underlying small adenomas. No significant renal or bladder abnormalities. Stomach/Bowel: The stomach, duodenum, small bowel and colon are unremarkable. Terminal ileum and appendix are normal. Stable colonic diverticulosis but no findings for acute diverticulitis. Vascular/Lymphatic: Stable advanced atherosclerotic calcification involving the aorta and branch vessels but no aneurysm or dissection. The major venous structures are patent. No mesenteric or retroperitoneal mass or adenopathy. Reproductive: The prostate gland and seminal vesicles are unremarkable. Other: No pelvic mass or adenopathy. No free pelvic fluid collections. No inguinal mass or adenopathy. No abdominal wall hernia or subcutaneous lesions. Musculoskeletal: No findings suspicious for osseous metastatic disease. Stable small sclerotic bone lesion in S1 is likely a benign bone island. IMPRESSION: 1. CT findings suggest an excellent response to treatment as detailed above. The mediastinal and hilar lymphadenopathy has significantly improved. The right lower lobe lung lesion is also much improved. No new pulmonary nodules. 2. Resolution of the left adrenal gland lesion. 3. Stable emphysematous changes and pulmonary  scarring. 4. Stable advanced atherosclerotic calcification involving the thoracic and abdominal aorta and branch vessels including the coronary arteries. 5. Stable small pericardial effusion. Aortic Atherosclerosis (ICD10-I70.0) and Emphysema (ICD10-J43.9). Electronically Signed   By: Marijo Sanes M.D.   On: 04/28/2022 16:54     ASSESSMENT:  Extensive stage small cell lung cancer: - Productive cough for 3 weeks with hemoptysis.  Hemoptysis improved after Eliquis stopped 2 weeks ago.  Still has some purulent sputum.  Currently on Levaquin. - Abnormal chest x-ray on 01/19/2022 for productive cough - CT chest with contrast (01/21/2022): Right hilar mass measuring 6.5 x 5.2 cm surrounding and compressing the right upper lobe bronchus, bronchus intermedius, right middle lobe bronchus and right lower lobe bronchus centrally.  Compression of the right lower lobe pulmonary artery.  Nodular soft tissue lesion in the right lower lobe continuous with right hilar mass and could represent endobronchial spread.  Enlarged mediastinal lymph nodes but no findings of pulmonary metastatic disease.  Indeterminate bilateral adrenal gland nodules and low attenuation liver lesions, likely benign cyst. - No weight loss.  No chest pains. - PET scan: Right hilar mass involving upper lobe, middle lobe and lower lobe with satellite nodularity in the right lower lobe, hypermetabolic subcarinal and right paratracheal lymphadenopathy.  Pleural-based nodule along the right posterior costophrenic angle.  Hypermetabolic left adrenal lesion.  Small hypermetabolic lesions in the humerus bilaterally and a subcutaneous focus in the right posterior lower back. - Right lung hilar mass biopsy (01/29/2022): Small cell carcinoma - Cycle 1 of carboplatin, etoposide and Atezolizumab on 02/23/2022    Social/family history: - He lives at home with his wife  Jana Half.  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  Retired 8 years ago.  Smoked 1  pack/day for 50 years. - Father had renal cell carcinoma.   PLAN:  Extensive stage small cell lung cancer: - He has completed 3 cycles of carboplatin, etoposide and Atezolizumab. - Denied any GI symptoms.  Appetite is good.  He is taking several naps during daytime.  He denies any new onset pains.  He feels generally good in the morning and afternoon he gets tired. - Reviewed CT CAP (04/28/2022): Excellent response to treatment with significant improvement in mediastinal and hilar adenopathy.  Right lower lobe lung lesion also much improved.  No new lesions.  Resolution of the left adrenal gland lesion. - Recommend proceeding with cycle 4 today.  This will conclude chemotherapy.  He will be placed on Atezolizumab maintenance after today.  RTC 3 weeks for follow-up.  2.  Esophagitis: - This has improved in the past to 4 weeks.  Not requiring Carafate.  3.  Hypokalemia: - He is taking potassium 60 mEq daily. - He is taking Lasix and metolazone 1-2 times per week, last dose on 04/26/2022. - Potassium today is 2.9.  Will replete in the office.   Orders placed this encounter:  No orders of the defined types were placed in this encounter.    [name], MD [facility] [phone]   I, Thana Ates, am acting as a scribe for Dr. [name].  I, [name] MD, have reviewed the above documentation for accuracy and completeness, and I agree with the above.

## 2022-04-29 NOTE — Progress Notes (Signed)
Ok to treat with SBP <90  T.O. Dr Rhys Martini, PharmD

## 2022-04-29 NOTE — Progress Notes (Signed)
Patient presents today for chemotherapy infusion.  Patient is in satisfactory condition with no complaints voiced.  Vital signs are stable.  BP is 89/60 which is relatively normal for him.  MD made aware.  No further fluids today per MD.  Potassium today is 2.9.  Patient will get Potassium 40 mEq PO/20 mEq IV/40 mEq PO today per Dr. Delton Coombes.  We will proceed with treatment per MD orders.  Patient tolerated treatment well with no complaints voiced.  Patient left ambulatory in stable condition.  Vital signs stable at discharge.  Follow up as scheduled.

## 2022-04-29 NOTE — Progress Notes (Signed)
Patients port flushed without difficulty.  Good blood return noted with no bruising or swelling noted at site.  Patient remains accessed for chemotherapy treatment.  

## 2022-04-30 ENCOUNTER — Other Ambulatory Visit: Payer: Self-pay

## 2022-04-30 ENCOUNTER — Inpatient Hospital Stay (HOSPITAL_COMMUNITY): Payer: PPO

## 2022-04-30 ENCOUNTER — Encounter (HOSPITAL_COMMUNITY): Payer: Self-pay

## 2022-04-30 VITALS — BP 124/86 | HR 65 | Temp 97.5°F | Resp 20

## 2022-04-30 DIAGNOSIS — Z5111 Encounter for antineoplastic chemotherapy: Secondary | ICD-10-CM | POA: Diagnosis not present

## 2022-04-30 DIAGNOSIS — G4733 Obstructive sleep apnea (adult) (pediatric): Secondary | ICD-10-CM | POA: Diagnosis not present

## 2022-04-30 DIAGNOSIS — C3411 Malignant neoplasm of upper lobe, right bronchus or lung: Secondary | ICD-10-CM

## 2022-04-30 MED ORDER — SODIUM CHLORIDE 0.9% FLUSH
10.0000 mL | INTRAVENOUS | Status: DC | PRN
  Administered 2022-04-30 (×2): 10 mL

## 2022-04-30 MED ORDER — SODIUM CHLORIDE 0.9 % IV SOLN: Freq: Once | INTRAVENOUS | Status: AC

## 2022-04-30 MED ORDER — HEPARIN SOD (PORK) LOCK FLUSH 100 UNIT/ML IV SOLN
500.0000 [IU] | Freq: Once | INTRAVENOUS | Status: AC | PRN
  Administered 2022-04-30: 500 [IU]

## 2022-04-30 MED ORDER — SODIUM CHLORIDE 0.9 % IV SOLN
80.0000 mg/m2 | Freq: Once | INTRAVENOUS | Status: AC
  Administered 2022-04-30: 180 mg via INTRAVENOUS
  Filled 2022-04-30: qty 9

## 2022-04-30 MED ORDER — DEXAMETHASONE SODIUM PHOSPHATE 10 MG/ML IJ SOLN
5.0000 mg | Freq: Once | INTRAMUSCULAR | Status: AC
  Administered 2022-04-30: 5 mg via INTRAVENOUS
  Filled 2022-04-30: qty 0.5

## 2022-04-30 NOTE — Patient Instructions (Signed)
Larry Moore  Discharge Instructions: Thank you for choosing [facility] to provide your oncology and hematology care.  If you have a lab appointment with the [facility], please come in thru the Main Entrance and check in at the main information desk.  Wear comfortable clothing and clothing appropriate for easy access to any Portacath or PICC line.   We strive to give you quality time with your provider. You may need to reschedule your appointment if you arrive late (15 or more minutes).  Arriving late affects you and other patients whose appointments are after yours.  Also, if you miss three or more appointments without notifying the office, you may be dismissed from the clinic at the provider's discretion.      For prescription refill requests, have your pharmacy contact our office and allow 72 hours for refills to be completed.    Today you received the following Etoposide, return as scheduled.   To help prevent nausea and vomiting after your treatment, we encourage you to take your nausea medication as directed.  BELOW ARE SYMPTOMS THAT SHOULD BE REPORTED IMMEDIATELY: *FEVER GREATER THAN 100.4 F (38 C) OR HIGHER *CHILLS OR SWEATING *NAUSEA AND VOMITING THAT IS NOT CONTROLLED WITH YOUR NAUSEA MEDICATION *UNUSUAL SHORTNESS OF BREATH *UNUSUAL BRUISING OR BLEEDING *URINARY PROBLEMS (pain or burning when urinating, or frequent urination) *BOWEL PROBLEMS (unusual diarrhea, constipation, pain near the anus) TENDERNESS IN MOUTH AND THROAT WITH OR WITHOUT PRESENCE OF ULCERS (sore throat, sores in mouth, or a toothache) UNUSUAL RASH, SWELLING OR PAIN  UNUSUAL VAGINAL DISCHARGE OR ITCHING   Items with * indicate a potential emergency and should be followed up as soon as possible or go to the Emergency Department if any problems should occur.  Please show the CHEMOTHERAPY ALERT CARD or IMMUNOTHERAPY ALERT CARD at check-in to the Emergency Department and triage  nurse.  Should you have questions after your visit or need to cancel or reschedule your appointment, please contact [facility] [phone]  and follow the prompts.  Office hours are 8:00 a.m. to 4:30 p.m. Monday - Friday. Please note that voicemails left after 4:00 p.m. may not be returned until the following business day.  We are closed weekends and major holidays. You have access to a nurse at all times for urgent questions. Please call the main number to the clinic [phone] and follow the prompts.  For any non-urgent questions, you may also contact your provider using MyChart. We now offer e-Visits for anyone 40 and older to request care online for non-urgent symptoms. For details visit [email or web address].   Also download the MyChart app! Go to the app store, search "MyChart", open the app, select [facility], and log in with your MyChart username and password.  Masks are optional in the cancer centers. If you would like for your care team to wear a mask while they are taking care of you, please let them know. For doctor visits, patients may have with them one support person who is at least 71 years old. At this time, visitors are not allowed in the infusion area.

## 2022-04-30 NOTE — Progress Notes (Signed)
Patient tolerated therapy with no complaints voiced. Side effects with management reviewed with understanding verbalized. Port site clean and dry with no bruising or swelling noted at site. Good blood return noted before and after administration of therapy. Band aid applied. Patient left in satisfactory condition with VSS and no s/s of distress noted.

## 2022-05-01 ENCOUNTER — Inpatient Hospital Stay (HOSPITAL_COMMUNITY): Payer: PPO

## 2022-05-01 VITALS — BP 108/74 | HR 65 | Temp 97.8°F | Resp 16

## 2022-05-01 DIAGNOSIS — C3411 Malignant neoplasm of upper lobe, right bronchus or lung: Secondary | ICD-10-CM

## 2022-05-01 DIAGNOSIS — Z5111 Encounter for antineoplastic chemotherapy: Secondary | ICD-10-CM | POA: Diagnosis not present

## 2022-05-01 MED ORDER — SODIUM CHLORIDE 0.9 % IV SOLN
80.0000 mg/m2 | Freq: Once | INTRAVENOUS | Status: AC
  Administered 2022-05-01: 180 mg via INTRAVENOUS
  Filled 2022-05-01: qty 9

## 2022-05-01 MED ORDER — SODIUM CHLORIDE 0.9 % IV SOLN: Freq: Once | INTRAVENOUS | Status: AC

## 2022-05-01 MED ORDER — HEPARIN SOD (PORK) LOCK FLUSH 100 UNIT/ML IV SOLN
500.0000 [IU] | Freq: Once | INTRAVENOUS | Status: AC | PRN
  Administered 2022-05-01: 500 [IU]

## 2022-05-01 MED ORDER — DEXAMETHASONE SODIUM PHOSPHATE 10 MG/ML IJ SOLN
5.0000 mg | Freq: Once | INTRAMUSCULAR | Status: AC
  Administered 2022-05-01: 5 mg via INTRAVENOUS
  Filled 2022-05-01: qty 0.5

## 2022-05-01 MED ORDER — SODIUM CHLORIDE 0.9% FLUSH
10.0000 mL | INTRAVENOUS | Status: DC | PRN
  Administered 2022-05-01: 10 mL

## 2022-05-01 NOTE — Progress Notes (Signed)
Treatment given per orders. Patient tolerated it well without problems. Vitals stable and discharged home from clinic ambulatory. Follow up as scheduled.  

## 2022-05-01 NOTE — Patient Instructions (Signed)
[facility]  Discharge Instructions: Thank you for choosing [facility] to provide your oncology and hematology care.  If you have a lab appointment with the [facility], please come in thru the Main Entrance and check in at the main information desk.  Wear comfortable clothing and clothing appropriate for easy access to any Portacath or PICC line.   We strive to give you quality time with your provider. You may need to reschedule your appointment if you arrive late (15 or more minutes).  Arriving late affects you and other patients whose appointments are after yours.  Also, if you miss three or more appointments without notifying the office, you may be dismissed from the clinic at the provider's discretion.      For prescription refill requests, have your pharmacy contact our office and allow 72 hours for refills to be completed.    Today you received the following chemotherapy and/or immunotherapy agents etoposide      To help prevent nausea and vomiting after your treatment, we encourage you to take your nausea medication as directed.  BELOW ARE SYMPTOMS THAT SHOULD BE REPORTED IMMEDIATELY: *FEVER GREATER THAN 100.4 F (38 C) OR HIGHER *CHILLS OR SWEATING *NAUSEA AND VOMITING THAT IS NOT CONTROLLED WITH YOUR NAUSEA MEDICATION *UNUSUAL SHORTNESS OF BREATH *UNUSUAL BRUISING OR BLEEDING *URINARY PROBLEMS (pain or burning when urinating, or frequent urination) *BOWEL PROBLEMS (unusual diarrhea, constipation, pain near the anus) TENDERNESS IN MOUTH AND THROAT WITH OR WITHOUT PRESENCE OF ULCERS (sore throat, sores in mouth, or a toothache) UNUSUAL RASH, SWELLING OR PAIN  UNUSUAL VAGINAL DISCHARGE OR ITCHING   Items with * indicate a potential emergency and should be followed up as soon as possible or go to the Emergency Department if any problems should occur.  Please show the CHEMOTHERAPY ALERT CARD or IMMUNOTHERAPY ALERT CARD at check-in to the Emergency  Department and triage nurse.  Should you have questions after your visit or need to cancel or reschedule your appointment, please contact [facility] [phone]  and follow the prompts.  Office hours are 8:00 a.m. to 4:30 p.m. Monday - Friday. Please note that voicemails left after 4:00 p.m. may not be returned until the following business day.  We are closed weekends and major holidays. You have access to a nurse at all times for urgent questions. Please call the main number to the clinic [phone] and follow the prompts.  For any non-urgent questions, you may also contact your provider using MyChart. We now offer e-Visits for anyone 46 and older to request care online for non-urgent symptoms. For details visit [email or web address].   Also download the MyChart app! Go to the app store, search "MyChart", open the app, select [facility], and log in with your MyChart username and password.  Masks are optional in the cancer centers. If you would like for your care team to wear a mask while they are taking care of you, please let them know. For doctor visits, patients may have with them one support person who is at least 71 years old. At this time, visitors are not allowed in the infusion area.

## 2022-05-02 ENCOUNTER — Other Ambulatory Visit: Payer: Self-pay

## 2022-05-04 ENCOUNTER — Inpatient Hospital Stay (HOSPITAL_COMMUNITY): Payer: PPO

## 2022-05-04 VITALS — BP 91/64 | HR 83 | Temp 98.3°F | Resp 18

## 2022-05-04 DIAGNOSIS — Z5111 Encounter for antineoplastic chemotherapy: Secondary | ICD-10-CM | POA: Diagnosis not present

## 2022-05-04 DIAGNOSIS — C3411 Malignant neoplasm of upper lobe, right bronchus or lung: Secondary | ICD-10-CM

## 2022-05-04 MED ORDER — PEGFILGRASTIM-CBQV 6 MG/0.6ML ~~LOC~~ SOSY
6.0000 mg | PREFILLED_SYRINGE | Freq: Once | SUBCUTANEOUS | Status: AC
  Administered 2022-05-04: 6 mg via SUBCUTANEOUS
  Filled 2022-05-04: qty 0.6

## 2022-05-04 NOTE — Progress Notes (Signed)
Larry Moore presents today for Udenyca injection per the provider's orders.  Stable during administration without incident; injection site WNL; see MAR for injection details.  Patient tolerated procedure well and without incident.  No questions or complaints noted at this time.

## 2022-05-04 NOTE — Patient Instructions (Signed)
[facility]  Discharge Instructions: Thank you for choosing [facility] to provide your oncology and hematology care.  If you have a lab appointment with the [facility], please come in thru the Main Entrance and check in at the main information desk.  Wear comfortable clothing and clothing appropriate for easy access to any Portacath or PICC line.   We strive to give you quality time with your provider. You may need to reschedule your appointment if you arrive late (15 or more minutes).  Arriving late affects you and other patients whose appointments are after yours.  Also, if you miss three or more appointments without notifying the office, you may be dismissed from the clinic at the provider's discretion.      For prescription refill requests, have your pharmacy contact our office and allow 72 hours for refills to be completed.    Today you received the following chemotherapy and/or immunotherapy agents Udenyca      To help prevent nausea and vomiting after your treatment, we encourage you to take your nausea medication as directed.  BELOW ARE SYMPTOMS THAT SHOULD BE REPORTED IMMEDIATELY: *FEVER GREATER THAN 100.4 F (38 C) OR HIGHER *CHILLS OR SWEATING *NAUSEA AND VOMITING THAT IS NOT CONTROLLED WITH YOUR NAUSEA MEDICATION *UNUSUAL SHORTNESS OF BREATH *UNUSUAL BRUISING OR BLEEDING *URINARY PROBLEMS (pain or burning when urinating, or frequent urination) *BOWEL PROBLEMS (unusual diarrhea, constipation, pain near the anus) TENDERNESS IN MOUTH AND THROAT WITH OR WITHOUT PRESENCE OF ULCERS (sore throat, sores in mouth, or a toothache) UNUSUAL RASH, SWELLING OR PAIN  UNUSUAL VAGINAL DISCHARGE OR ITCHING   Items with * indicate a potential emergency and should be followed up as soon as possible or go to the Emergency Department if any problems should occur.  Please show the CHEMOTHERAPY ALERT CARD or IMMUNOTHERAPY ALERT CARD at check-in to the Emergency  Department and triage nurse.  Should you have questions after your visit or need to cancel or reschedule your appointment, please contact [facility] [phone]  and follow the prompts.  Office hours are 8:00 a.m. to 4:30 p.m. Monday - Friday. Please note that voicemails left after 4:00 p.m. may not be returned until the following business day.  We are closed weekends and major holidays. You have access to a nurse at all times for urgent questions. Please call the main number to the clinic [phone] and follow the prompts.  For any non-urgent questions, you may also contact your provider using MyChart. We now offer e-Visits for anyone 89 and older to request care online for non-urgent symptoms. For details visit [email or web address].   Also download the MyChart app! Go to the app store, search "MyChart", open the app, select [facility], and log in with your MyChart username and password.  Masks are optional in the cancer centers. If you would like for your care team to wear a mask while they are taking care of you, please let them know. For doctor visits, patients may have with them one support person who is at least 71 years old. At this time, visitors are not allowed in the infusion area.

## 2022-05-05 ENCOUNTER — Other Ambulatory Visit: Payer: Self-pay

## 2022-05-20 ENCOUNTER — Ambulatory Visit (HOSPITAL_COMMUNITY): Payer: PPO | Admitting: Hematology

## 2022-05-20 ENCOUNTER — Ambulatory Visit (HOSPITAL_COMMUNITY): Payer: PPO

## 2022-05-20 ENCOUNTER — Other Ambulatory Visit (HOSPITAL_COMMUNITY): Payer: PPO

## 2022-05-21 ENCOUNTER — Other Ambulatory Visit: Payer: Self-pay

## 2022-05-25 ENCOUNTER — Inpatient Hospital Stay: Payer: PPO | Attending: Hematology | Admitting: Hematology

## 2022-05-25 ENCOUNTER — Inpatient Hospital Stay: Payer: PPO

## 2022-05-25 VITALS — BP 108/72 | HR 69 | Temp 97.9°F | Resp 18 | Ht 68.0 in | Wt 239.2 lb

## 2022-05-25 VITALS — BP 113/78 | HR 63 | Temp 97.3°F | Resp 20

## 2022-05-25 DIAGNOSIS — G4700 Insomnia, unspecified: Secondary | ICD-10-CM | POA: Diagnosis not present

## 2022-05-25 DIAGNOSIS — C3411 Malignant neoplasm of upper lobe, right bronchus or lung: Secondary | ICD-10-CM

## 2022-05-25 DIAGNOSIS — E876 Hypokalemia: Secondary | ICD-10-CM | POA: Insufficient documentation

## 2022-05-25 DIAGNOSIS — Z8051 Family history of malignant neoplasm of kidney: Secondary | ICD-10-CM | POA: Diagnosis not present

## 2022-05-25 DIAGNOSIS — Z79899 Other long term (current) drug therapy: Secondary | ICD-10-CM | POA: Diagnosis not present

## 2022-05-25 DIAGNOSIS — Z5112 Encounter for antineoplastic immunotherapy: Secondary | ICD-10-CM | POA: Insufficient documentation

## 2022-05-25 DIAGNOSIS — F17210 Nicotine dependence, cigarettes, uncomplicated: Secondary | ICD-10-CM | POA: Diagnosis not present

## 2022-05-25 DIAGNOSIS — I10 Essential (primary) hypertension: Secondary | ICD-10-CM | POA: Diagnosis not present

## 2022-05-25 LAB — COMPREHENSIVE METABOLIC PANEL WITH GFR
ALT: 11 U/L (ref 0–44)
AST: 11 U/L — ABNORMAL LOW (ref 15–41)
Albumin: 3.4 g/dL — ABNORMAL LOW (ref 3.5–5.0)
Alkaline Phosphatase: 73 U/L (ref 38–126)
Anion gap: 6 (ref 5–15)
BUN: 9 mg/dL (ref 8–23)
CO2: 32 mmol/L (ref 22–32)
Calcium: 8.1 mg/dL — ABNORMAL LOW (ref 8.9–10.3)
Chloride: 99 mmol/L (ref 98–111)
Creatinine, Ser: 0.54 mg/dL — ABNORMAL LOW (ref 0.61–1.24)
GFR, Estimated: >60 mL/min
Glucose, Bld: 99 mg/dL (ref 70–99)
Potassium: 3.5 mmol/L (ref 3.5–5.1)
Sodium: 137 mmol/L (ref 135–145)
Total Bilirubin: 0.7 mg/dL (ref 0.3–1.2)
Total Protein: 6.4 g/dL — ABNORMAL LOW (ref 6.5–8.1)

## 2022-05-25 LAB — CBC WITH DIFFERENTIAL/PLATELET
Abs Immature Granulocytes: 0.09 10*3/uL — ABNORMAL HIGH (ref 0.00–0.07)
Basophils Absolute: 0 10*3/uL (ref 0.0–0.1)
Basophils Relative: 1 %
Eosinophils Absolute: 0 10*3/uL (ref 0.0–0.5)
Eosinophils Relative: 1 %
HCT: 35.5 % — ABNORMAL LOW (ref 39.0–52.0)
Hemoglobin: 11.7 g/dL — ABNORMAL LOW (ref 13.0–17.0)
Immature Granulocytes: 1 %
Lymphocytes Relative: 11 %
Lymphs Abs: 0.7 10*3/uL (ref 0.7–4.0)
MCH: 33.6 pg (ref 26.0–34.0)
MCHC: 33 g/dL (ref 30.0–36.0)
MCV: 102 fL — ABNORMAL HIGH (ref 80.0–100.0)
Monocytes Absolute: 0.9 10*3/uL (ref 0.1–1.0)
Monocytes Relative: 14 %
Neutro Abs: 4.6 10*3/uL (ref 1.7–7.7)
Neutrophils Relative %: 72 %
Platelets: 304 10*3/uL (ref 150–400)
RBC: 3.48 MIL/uL — ABNORMAL LOW (ref 4.22–5.81)
RDW: 19.6 % — ABNORMAL HIGH (ref 11.5–15.5)
WBC: 6.3 10*3/uL (ref 4.0–10.5)
nRBC: 0 % (ref 0.0–0.2)

## 2022-05-25 LAB — MAGNESIUM: Magnesium: 1.7 mg/dL (ref 1.7–2.4)

## 2022-05-25 LAB — TSH: TSH: 1.059 u[IU]/mL (ref 0.350–4.500)

## 2022-05-25 MED ORDER — HEPARIN SOD (PORK) LOCK FLUSH 100 UNIT/ML IV SOLN
500.0000 [IU] | Freq: Once | INTRAVENOUS | Status: AC | PRN
  Administered 2022-05-25: 500 [IU]

## 2022-05-25 MED ORDER — ZOLPIDEM TARTRATE 10 MG PO TABS: 10.0000 mg | ORAL_TABLET | Freq: Every evening | ORAL | 0 refills | Status: DC | PRN

## 2022-05-25 MED ORDER — SODIUM CHLORIDE 0.9 % IV SOLN: Freq: Once | INTRAVENOUS | Status: AC

## 2022-05-25 MED ORDER — POTASSIUM CHLORIDE CRYS ER 10 MEQ PO TBCR: 10.0000 meq | EXTENDED_RELEASE_TABLET | Freq: Two times a day (BID) | ORAL | 3 refills | Status: DC

## 2022-05-25 MED ORDER — SODIUM CHLORIDE 0.9 % IV SOLN
1200.0000 mg | Freq: Once | INTRAVENOUS | Status: AC
  Administered 2022-05-25: 1200 mg via INTRAVENOUS
  Filled 2022-05-25: qty 20

## 2022-05-25 MED ORDER — SODIUM CHLORIDE 0.9% FLUSH
10.0000 mL | INTRAVENOUS | Status: DC | PRN
  Administered 2022-05-25: 10 mL

## 2022-05-25 NOTE — Progress Notes (Signed)
Patients port flushed without difficulty.  Good blood return noted with no bruising or swelling noted at site.  Patient remains accessed for chemotherapy treatment.  

## 2022-05-25 NOTE — Patient Instructions (Signed)
[facility]  Discharge Instructions: Thank you for choosing [facility] to provide your oncology and hematology care.  If you have a lab appointment with the [facility], please come in thru the Main Entrance and check in at the main information desk.  Wear comfortable clothing and clothing appropriate for easy access to any Portacath or PICC line.   We strive to give you quality time with your provider. You may need to reschedule your appointment if you arrive late (15 or more minutes).  Arriving late affects you and other patients whose appointments are after yours.  Also, if you miss three or more appointments without notifying the office, you may be dismissed from the clinic at the provider's discretion.      For prescription refill requests, have your pharmacy contact our office and allow 72 hours for refills to be completed.    Today you received the following chemotherapy and/or immunotherapy agents tecentriq      To help prevent nausea and vomiting after your treatment, we encourage you to take your nausea medication as directed.  BELOW ARE SYMPTOMS THAT SHOULD BE REPORTED IMMEDIATELY: *FEVER GREATER THAN 100.4 F (38 C) OR HIGHER *CHILLS OR SWEATING *NAUSEA AND VOMITING THAT IS NOT CONTROLLED WITH YOUR NAUSEA MEDICATION *UNUSUAL SHORTNESS OF BREATH *UNUSUAL BRUISING OR BLEEDING *URINARY PROBLEMS (pain or burning when urinating, or frequent urination) *BOWEL PROBLEMS (unusual diarrhea, constipation, pain near the anus) TENDERNESS IN MOUTH AND THROAT WITH OR WITHOUT PRESENCE OF ULCERS (sore throat, sores in mouth, or a toothache) UNUSUAL RASH, SWELLING OR PAIN  UNUSUAL VAGINAL DISCHARGE OR ITCHING   Items with * indicate a potential emergency and should be followed up as soon as possible or go to the Emergency Department if any problems should occur.  Please show the CHEMOTHERAPY ALERT CARD or IMMUNOTHERAPY ALERT CARD at check-in to the  Emergency Department and triage nurse.  Should you have questions after your visit or need to cancel or reschedule your appointment, please contact [facility] [phone]  and follow the prompts.  Office hours are 8:00 a.m. to 4:30 p.m. Monday - Friday. Please note that voicemails left after 4:00 p.m. may not be returned until the following business day.  We are closed weekends and major holidays. You have access to a nurse at all times for urgent questions. Please call the main number to the clinic [phone] and follow the prompts.  For any non-urgent questions, you may also contact your provider using MyChart. We now offer e-Visits for anyone 14 and older to request care online for non-urgent symptoms. For details visit [email or web address].   Also download the MyChart app! Go to the app store, search "MyChart", open the app, select [facility], and log in with your MyChart username and password.  Masks are optional in the cancer centers. If you would like for your care team to wear a mask while they are taking care of you, please let them know. You may have one support person who is at least 71 years old accompany you for your appointments.

## 2022-05-25 NOTE — Patient Instructions (Signed)
[facility] Discharge Instructions   You were seen and examined today by Dr. Katragadda.  He reviewed the results of your lab work which are normal/stable.   We will proceed with your treatment today.  Return as scheduled.    Thank you for choosing  Cancer Center at [facility] to provide your oncology and hematology care.  To afford each patient quality time with our provider, please arrive at least 15 minutes before your scheduled appointment time.   If you have a lab appointment with the Cancer Center please come in thru the Main Entrance and check in at the main information desk.  You need to re-schedule your appointment should you arrive 10 or more minutes late.  We strive to give you quality time with our providers, and arriving late affects you and other patients whose appointments are after yours.  Also, if you no show three or more times for appointments you may be dismissed from the clinic at the providers discretion.     Again, thank you for choosing [facility].  Our hope is that these requests will decrease the amount of time that you wait before being seen by our physicians.       _____________________________________________________________  Should you have questions after your visit to [facility], please contact our office at [phone] and follow the prompts.  Our office hours are 8:00 a.m. and 4:30 p.m. Monday - Friday.  Please note that voicemails left after 4:00 p.m. may not be returned until the following business day.  We are closed weekends and major holidays.  You do have access to a nurse 24-7, just call the main number to the clinic [phone] and do not press any options, hold on the line and a nurse will answer the phone.    For prescription refill requests, have your pharmacy contact our office and allow 72 hours.    Due to Covid, you will need to wear a mask upon entering  the hospital. If you do not have a mask, a mask will be given to you at the Main Entrance upon arrival. For doctor visits, patients may have 1 support person age 18 or older with them. For treatment visits, patients can not have anyone with them due to social distancing guidelines and our immunocompromised population.      

## 2022-05-25 NOTE — Progress Notes (Signed)
[place], [place] [address]   CLINIC:  Medical Oncology/Hematology  PCP:  [facility], [place] / [place] [address] [phone]   REASON FOR VISIT:  Follow-up for right lung mass and mediastinal lymphadenopathy  PRIOR THERAPY: none  NGS Results: not done  CURRENT THERAPY: Carboplatin + Etoposide + Atezolizumab Induction q21d / Atezolizumab   BRIEF ONCOLOGIC HISTORY:  Oncology History  Small cell lung cancer, right upper lobe ([facility])  01/22/2022 Initial Diagnosis   Small cell lung cancer, right upper lobe ([facility])   01/22/2022 Cancer Staging   Staging form: Lung, AJCC 8th Edition - Clinical stage from 01/22/2022: Stage IVB (cT4, cN2, pM1c) - Signed by [name], MD on 02/16/2022 Histopathologic type: Small cell carcinoma, NOS Stage prefix: Initial diagnosis   02/23/2022 -  Chemotherapy   Patient is on Treatment Plan : LUNG SCLC Carboplatin + Etoposide + Atezolizumab Induction q21d / Atezolizumab Maintenance q21d       CANCER STAGING:  Cancer Staging  Small cell lung cancer, right upper lobe ([facility]) Staging form: Lung, AJCC 8th Edition - Clinical stage from 01/22/2022: Stage IVB (cT4, cN2, pM1c) - Signed by [name], MD on 02/16/2022   INTERVAL HISTORY:  [name], a 71 y.o. male, returns for follow-up plan toxicity assessment and neck cycle of treatment.  He has completed 4 cycles of chemoimmunotherapy.  Energy levels are 60%.  Chronic cough is stable.  Reports difficulty staying asleep.  He is already taking Xanax which is not helping.   REVIEW OF SYSTEMS:  Review of Systems  Constitutional:  Negative for appetite change and fatigue.  HENT:   Negative for trouble swallowing.   Respiratory:  Positive for cough.   Cardiovascular:  Negative for palpitations.  Gastrointestinal:  Negative for diarrhea.  Psychiatric/Behavioral:  Positive for sleep disturbance.   All other systems  reviewed and are negative.   PAST MEDICAL/SURGICAL HISTORY:  Past Medical History:  Diagnosis Date   Dyspnea    Dysrhythmia    hx a-fib   Hypertension    Insomnia    Pre-diabetes    Sleep apnea    Past Surgical History:  Procedure Laterality Date   ATRIAL FIBRILLATION ABLATION     approx 2020   CARDIAC CATHETERIZATION     HERNIA REPAIR     IR IMAGING GUIDED PORT INSERTION  02/17/2022   IR VENO/JUGULAR RIGHT  02/17/2022   VIDEO BRONCHOSCOPY WITH ENDOBRONCHIAL ULTRASOUND Bilateral 01/29/2022   Procedure: VIDEO BRONCHOSCOPY WITH ENDOBRONCHIAL ULTRASOUND;  Surgeon: Garner Nash, DO;  Location: [facility];  Service: Cardiopulmonary;  Laterality: Bilateral;  w/ Guardant [id number]    SOCIAL HISTORY:  Social History   Socioeconomic History   Marital status: Married    Spouse name: Not on file   Number of children: Not on file   Years of education: Not on file   Highest education level: Not on file  Occupational History   Occupation: Retired  Tobacco Use   Smoking status: Every Day    Types: Cigarettes    Passive exposure: Current   Smokeless tobacco: Not on file   Tobacco comments:    Pt smokes 1 ppd. 01/26/22  Vaping Use   Vaping Use: Never used  Substance and Sexual Activity   Alcohol use: Not Currently    Comment: 1 bottle wine a day    Drug use: No   Sexual activity: Not on file  Other Topics Concern   Not  on file  Social History Narrative   Not on file   Social Determinants of Health   Financial Resource Strain: Not on file  Food Insecurity: Not on file  Transportation Needs: Not on file  Physical Activity: Not on file  Stress: Not on file  Social Connections: Not on file  Intimate Partner Violence: Not on file    FAMILY HISTORY:  Family History  Problem Relation Age of Onset   Coronary artery disease Father    Diabetes Father     CURRENT MEDICATIONS:  Current Outpatient Medications  Medication Sig Dispense Refill   ALPRAZolam (XANAX) 1 MG tablet Take  1 tablet (1 mg total) by mouth at bedtime as needed for anxiety. 30 tablet 3   Atezolizumab (TECENTRIQ IV) Inject into the vein every 21 ( twenty-one) days.     baclofen (LIORESAL) 10 MG tablet Take 1-2 tablets (10-20 mg total) by mouth 3 (three) times daily. 180 each 0   furosemide (LASIX) 80 MG tablet Take 80 mg by mouth daily.     lidocaine (XYLOCAINE) 2 % solution Use as directed 15 mLs in the mouth or throat as needed for mouth pain (prior to meals/supplements to decrease pain with swallowing). Mix 1:1 with Maalox 100 mL 6   lidocaine-prilocaine (EMLA) cream Apply topically as directed.     metolazone (ZAROXOLYN) 2.5 MG tablet Take 2.5 mg by mouth daily.     nystatin (MYCOSTATIN) 100000 UNIT/ML suspension Take 5 mLs (500,000 Units total) by mouth 4 (four) times daily. 120 mL 3   ondansetron (ZOFRAN) 8 MG tablet Take 1 tablet (8 mg total) by mouth every 8 (eight) hours as needed for refractory nausea / vomiting. Start on day 3 after carboplatin chemo. 30 tablet 1   potassium chloride (KLOR-CON M) 10 MEQ tablet Take 1 tablet (10 mEq total) by mouth 2 (two) times daily. 180 tablet 3   prochlorperazine (COMPAZINE) 10 MG tablet Take 1 tablet (10 mg total) by mouth every 6 (six) hours as needed (Nausea or vomiting). 60 tablet 3   sotalol (BETAPACE) 80 MG tablet SMARTSIG:1 Tablet(s) By Mouth Every 12 Hours     sucralfate (CARAFATE) 1 GM/10ML suspension TAKE 10MLS BY MOUTH 4 TIMES DAILY AS NEEDED. 480 mL 6   Vitamin D, Ergocalciferol, (DRISDOL) 1.25 MG (50000 UNIT) CAPS capsule Take 50,000 Units by mouth daily.     zolpidem (AMBIEN) 10 MG tablet Take 1 tablet (10 mg total) by mouth at bedtime as needed for sleep. 30 tablet 0   No current facility-administered medications for this visit.   Facility-Administered Medications Ordered in Other Visits  Medication Dose Route Frequency Provider Last Rate Last Admin   sodium chloride flush (NS) 0.9 % injection 10 mL  10 mL Intracatheter PRN [name], MD   10 mL at 05/25/22 1304    ALLERGIES:  No Known Allergies  PHYSICAL EXAM:  Performance status (ECOG): 1 - Symptomatic but completely ambulatory  Vitals:   05/25/22 1042  BP: 108/72  Pulse: 69  Resp: 18  Temp: 97.9 F (36.6 C)  SpO2: 94%   Wt Readings from Last 3 Encounters:  05/25/22 239 lb 3.2 oz (108.5 kg)  04/29/22 236 lb (107 kg)  04/08/22 235 lb 3.2 oz (106.7 kg)   Physical Exam Vitals reviewed.  Constitutional:      Appearance: Normal appearance.  Cardiovascular:     Rate and Rhythm: Normal rate and regular rhythm.     Pulses: Normal pulses.  Heart sounds: Normal heart sounds.  Pulmonary:     Effort: Pulmonary effort is normal.     Breath sounds: Normal breath sounds.  Neurological:     General: No focal deficit present.     Mental Status: He is alert and oriented to person, place, and time.  Psychiatric:        Mood and Affect: Mood normal.        Behavior: Behavior normal.     LABORATORY DATA:  I have reviewed the labs as listed.     Latest Ref Rng & Units 05/25/2022    9:51 AM 04/29/2022    9:49 AM 04/08/2022    8:17 AM  CBC  WBC 4.0 - 10.5 K/uL 6.3  10.2  12.7   Hemoglobin 13.0 - 17.0 g/dL 11.7  11.6  11.8   Hematocrit 39.0 - 52.0 % 35.5  35.2  35.5   Platelets 150 - 400 K/uL 304  278  415       Latest Ref Rng & Units 05/25/2022    9:51 AM 04/29/2022    9:49 AM 04/08/2022    8:17 AM  CMP  Glucose 70 - 99 mg/dL 99  103  94   BUN 8 - 23 mg/dL 9  7  16    Creatinine 0.61 - 1.24 mg/dL 0.54  0.72  0.87   Sodium 135 - 145 mmol/L 137  138  138   Potassium 3.5 - 5.1 mmol/L 3.5  2.9  2.8   Chloride 98 - 111 mmol/L 99  97  99   CO2 22 - 32 mmol/L 32  32  29   Calcium 8.9 - 10.3 mg/dL 8.1  8.1  7.7   Total Protein 6.5 - 8.1 g/dL 6.4  6.4  6.1   Total Bilirubin 0.3 - 1.2 mg/dL 0.7  0.7  0.2   Alkaline Phos 38 - 126 U/L 73  79  69   AST 15 - 41 U/L 11  13  12    ALT 0 - 44 U/L 11  11  9      DIAGNOSTIC IMAGING:  I have independently  reviewed the scans and discussed with the patient. CT CHEST ABDOMEN PELVIS W CONTRAST  Result Date: 04/28/2022 CLINICAL DATA:  Restaging small cell lung cancer. * Tracking Code: BO * EXAM: CT CHEST, ABDOMEN, AND PELVIS WITH CONTRAST TECHNIQUE: Multidetector CT imaging of the chest, abdomen and pelvis was performed following the standard protocol during bolus administration of intravenous contrast. RADIATION DOSE REDUCTION: This exam was performed according to the departmental dose-optimization program which includes automated exposure control, adjustment of the mA and/or kV according to patient size and/or use of iterative reconstruction technique. CONTRAST:  113mL OMNIPAQUE IOHEXOL 300 MG/ML  SOLN COMPARISON:  PET-CT 02/05/2022 and prior chest CT 01/21/2022 FINDINGS: CT CHEST FINDINGS Cardiovascular: The heart is normal in size. Small stable pericardial effusion. The aorta is within normal limits in caliber. Stable atherosclerotic calcifications. No dissection. Stable significant three-vessel coronary artery calcifications. Mediastinum/Nodes: Much improved CT appearance of the mediastinal and hilar lymphadenopathy. Right paratracheal node the prior study measures 17 mm and now measures 8 mm lower. The lower right paratracheal node previously measured 17 mm and now measures 4 mm. Residual right hilar node measures 17.5 mm on image number 26/2. This was previously a large masslike area measuring 6.5 x 5.2 cm. The subcarinal adenopathy is significantly improved. There is a small residual node measuring 9 mm which previously measured 24 mm. The esophagus is grossly.  Lungs/Pleura: Mild residual right lower lobe peribronchial thickening but no persistent mass. The more peripheral hypermetabolic lesion in the right lower lobe seen on the prior CT and PET-CT is also much improved. This previously measured approximately 4.7 x 3.2 cm and now measures 2.9 x 0.9 cm. No new pulmonary nodules to suggest pulmonary metastatic  disease. Stable calcified granuloma in the right upper lobe. Stable underlying emphysematous changes and pulmonary scarring. No pleural effusions or pleural nodules. Musculoskeletal: No chest wall mass, supraclavicular or axillary adenopathy. The left-sided power port is in good position. CT ABDOMEN PELVIS FINDINGS Hepatobiliary: Stable small low-attenuation liver lesions consistent with benign cysts. No worrisome hepatic lesions to suggest metastatic disease. No intrahepatic biliary dilatation. The gallbladder is unremarkable. No common bile duct dilatation. Pancreas: No mass, inflammation or ductal dilatation. Spleen: Normal size.  No focal lesions. Adrenals/Urinary Tract: The hypermetabolic focus projecting off the lateral limb of the left adrenal gland has resolved. Stable underlying small adenomas. No significant renal or bladder abnormalities. Stomach/Bowel: The stomach, duodenum, small bowel and colon are unremarkable. Terminal ileum and appendix are normal. Stable colonic diverticulosis but no findings for acute diverticulitis. Vascular/Lymphatic: Stable advanced atherosclerotic calcification involving the aorta and branch vessels but no aneurysm or dissection. The major venous structures are patent. No mesenteric or retroperitoneal mass or adenopathy. Reproductive: The prostate gland and seminal vesicles are unremarkable. Other: No pelvic mass or adenopathy. No free pelvic fluid collections. No inguinal mass or adenopathy. No abdominal wall hernia or subcutaneous lesions. Musculoskeletal: No findings suspicious for osseous metastatic disease. Stable small sclerotic bone lesion in S1 is likely a benign bone island. IMPRESSION: 1. CT findings suggest an excellent response to treatment as detailed above. The mediastinal and hilar lymphadenopathy has significantly improved. The right lower lobe lung lesion is also much improved. No new pulmonary nodules. 2. Resolution of the left adrenal gland lesion. 3. Stable  emphysematous changes and pulmonary scarring. 4. Stable advanced atherosclerotic calcification involving the thoracic and abdominal aorta and branch vessels including the coronary arteries. 5. Stable small pericardial effusion. Aortic Atherosclerosis (ICD10-I70.0) and Emphysema (ICD10-J43.9). Electronically Signed   By: Marijo Sanes M.D.   On: 04/28/2022 16:54     ASSESSMENT:  Extensive stage small cell lung cancer: - Productive cough for 3 weeks with hemoptysis.  Hemoptysis improved after Eliquis stopped 2 weeks ago.  Still has some purulent sputum.  Currently on Levaquin. - Abnormal chest x-ray on 01/19/2022 for productive cough - CT chest with contrast (01/21/2022): Right hilar mass measuring 6.5 x 5.2 cm surrounding and compressing the right upper lobe bronchus, bronchus intermedius, right middle lobe bronchus and right lower lobe bronchus centrally.  Compression of the right lower lobe pulmonary artery.  Nodular soft tissue lesion in the right lower lobe continuous with right hilar mass and could represent endobronchial spread.  Enlarged mediastinal lymph nodes but no findings of pulmonary metastatic disease.  Indeterminate bilateral adrenal gland nodules and low attenuation liver lesions, likely benign cyst. - No weight loss.  No chest pains. - PET scan: Right hilar mass involving upper lobe, middle lobe and lower lobe with satellite nodularity in the right lower lobe, hypermetabolic subcarinal and right paratracheal lymphadenopathy.  Pleural-based nodule along the right posterior costophrenic angle.  Hypermetabolic left adrenal lesion.  Small hypermetabolic lesions in the humerus bilaterally and a subcutaneous focus in the right posterior lower back. - Right lung hilar mass biopsy (01/29/2022): Small cell carcinoma - Cycle 1 of carboplatin, etoposide and Atezolizumab on 02/23/2022  Social/family history: - He lives at home with his wife Larry Moore.  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   Retired 8 years ago.  Smoked 1 pack/day for 50 years. - Father had renal cell carcinoma.   PLAN:  Extensive stage small cell lung cancer: - CT CAP (04/28/2022): Response to treatment with significant improvement in mediastinal and hilar adenopathy.  Right lower lobe lung lesion also much improved.  No new lesions.  Resolution of the left adrenal lesion. - I have reviewed labs today which showed normal LFTs.  CBC was grossly normal.  TSH was 1.05. - Proceed with maintenance Atezolizumab today and in 3 weeks.  RTC 6 weeks for follow-up.  He is not keen on PCI due to side effects.  We will plan to do imaging including brain MRI in 9 weeks.  2.  Difficulty sleeping: - Xanax is not helping.  We will start him on Ambien 10 mg at bedtime.  3.  Hypokalemia: - Potassium today is normal.  Continue potassium supplements and increase them when he takes Lasix and metolazone.   Orders placed this encounter:  No orders of the defined types were placed in this encounter.    [name], MD [facility] [phone]

## 2022-05-25 NOTE — Progress Notes (Signed)
Patient has been examined by Dr. Katragadda, and vital signs and labs have been reviewed. ANC, Creatinine, LFTs, hemoglobin, and platelets are within treatment parameters per M.D. - pt may proceed with treatment.  Primary RN and pharmacy notified.  

## 2022-05-25 NOTE — Progress Notes (Signed)
Patient presents today for Tecentriq infusion per providers order.  Vital signs and labs within parameters for treatment.  Patient has no new complaints at this time.  Message received from Anastasio Champion RN/Dr.Katragadda patient okay for treatment.  Tecentriq given today per MD orders.  Stable during infusion without adverse affects.  Vital signs stable.  No complaints at this time.  Discharge from clinic ambulatory in stable condition.  Alert and oriented X 3.  Follow up with [facility] as scheduled.

## 2022-05-27 ENCOUNTER — Other Ambulatory Visit: Payer: Self-pay

## 2022-05-31 ENCOUNTER — Other Ambulatory Visit: Payer: Self-pay

## 2022-06-05 ENCOUNTER — Other Ambulatory Visit: Payer: Self-pay

## 2022-06-07 ENCOUNTER — Other Ambulatory Visit: Payer: Self-pay | Admitting: Hematology

## 2022-06-07 DIAGNOSIS — C3411 Malignant neoplasm of upper lobe, right bronchus or lung: Secondary | ICD-10-CM

## 2022-06-15 ENCOUNTER — Inpatient Hospital Stay: Payer: PPO

## 2022-06-15 ENCOUNTER — Inpatient Hospital Stay: Payer: PPO | Attending: Hematology

## 2022-06-15 VITALS — BP 94/57 | HR 70 | Temp 97.4°F | Resp 18

## 2022-06-15 DIAGNOSIS — C3411 Malignant neoplasm of upper lobe, right bronchus or lung: Secondary | ICD-10-CM | POA: Insufficient documentation

## 2022-06-15 DIAGNOSIS — E876 Hypokalemia: Secondary | ICD-10-CM | POA: Insufficient documentation

## 2022-06-15 DIAGNOSIS — Z5112 Encounter for antineoplastic immunotherapy: Secondary | ICD-10-CM | POA: Insufficient documentation

## 2022-06-15 DIAGNOSIS — Z79899 Other long term (current) drug therapy: Secondary | ICD-10-CM | POA: Insufficient documentation

## 2022-06-15 LAB — CBC WITH DIFFERENTIAL/PLATELET
Abs Immature Granulocytes: 0.02 10*3/uL (ref 0.00–0.07)
Basophils Absolute: 0 10*3/uL (ref 0.0–0.1)
Basophils Relative: 0 %
Eosinophils Absolute: 0.5 10*3/uL (ref 0.0–0.5)
Eosinophils Relative: 6 %
HCT: 43.7 % (ref 39.0–52.0)
Hemoglobin: 14.7 g/dL (ref 13.0–17.0)
Immature Granulocytes: 0 %
Lymphocytes Relative: 11 %
Lymphs Abs: 0.8 10*3/uL (ref 0.7–4.0)
MCH: 32.8 pg (ref 26.0–34.0)
MCHC: 33.6 g/dL (ref 30.0–36.0)
MCV: 97.5 fL (ref 80.0–100.0)
Monocytes Absolute: 0.5 10*3/uL (ref 0.1–1.0)
Monocytes Relative: 6 %
Neutro Abs: 6 10*3/uL (ref 1.7–7.7)
Neutrophils Relative %: 77 %
Platelets: 179 10*3/uL (ref 150–400)
RBC: 4.48 MIL/uL (ref 4.22–5.81)
RDW: 14.5 % (ref 11.5–15.5)
WBC: 7.8 10*3/uL (ref 4.0–10.5)
nRBC: 0 % (ref 0.0–0.2)

## 2022-06-15 LAB — COMPREHENSIVE METABOLIC PANEL
ALT: 11 U/L (ref 0–44)
AST: 16 U/L (ref 15–41)
Albumin: 4.1 g/dL (ref 3.5–5.0)
Alkaline Phosphatase: 80 U/L (ref 38–126)
Anion gap: 11 (ref 5–15)
BUN: 15 mg/dL (ref 8–23)
CO2: 36 mmol/L — ABNORMAL HIGH (ref 22–32)
Calcium: 8.8 mg/dL — ABNORMAL LOW (ref 8.9–10.3)
Chloride: 91 mmol/L — ABNORMAL LOW (ref 98–111)
Creatinine, Ser: 0.78 mg/dL (ref 0.61–1.24)
GFR, Estimated: >60 mL/min (ref >60)
Glucose, Bld: 160 mg/dL — ABNORMAL HIGH (ref 70–99)
Potassium: 2.8 mmol/L — ABNORMAL LOW (ref 3.5–5.1)
Sodium: 138 mmol/L (ref 135–145)
Total Bilirubin: 0.9 mg/dL (ref 0.3–1.2)
Total Protein: 7.6 g/dL (ref 6.5–8.1)

## 2022-06-15 LAB — POTASSIUM: Potassium: 2.5 mmol/L — CL (ref 3.5–5.1)

## 2022-06-15 LAB — TSH: TSH: 0.808 u[IU]/mL (ref 0.350–4.500)

## 2022-06-15 LAB — MAGNESIUM: Magnesium: 1.9 mg/dL (ref 1.7–2.4)

## 2022-06-15 MED ORDER — SODIUM CHLORIDE 0.9% FLUSH
10.0000 mL | INTRAVENOUS | Status: DC | PRN
  Administered 2022-06-15: 10 mL

## 2022-06-15 MED ORDER — SODIUM CHLORIDE 0.9 % IV SOLN: Freq: Once | INTRAVENOUS | Status: AC

## 2022-06-15 MED ORDER — SODIUM CHLORIDE 0.9 % IV SOLN
1200.0000 mg | Freq: Once | INTRAVENOUS | Status: AC
  Administered 2022-06-15: 1200 mg via INTRAVENOUS
  Filled 2022-06-15: qty 20

## 2022-06-15 MED ORDER — HEPARIN SOD (PORK) LOCK FLUSH 100 UNIT/ML IV SOLN
500.0000 [IU] | Freq: Once | INTRAVENOUS | Status: AC | PRN
  Administered 2022-06-15: 500 [IU]

## 2022-06-15 MED ORDER — POTASSIUM CHLORIDE CRYS ER 20 MEQ PO TBCR
40.0000 meq | EXTENDED_RELEASE_TABLET | Freq: Once | ORAL | Status: AC
  Administered 2022-06-15: 40 meq via ORAL
  Filled 2022-06-15: qty 2

## 2022-06-15 NOTE — Patient Instructions (Signed)
[facility]  Discharge Instructions: Thank you for choosing [facility] to provide your oncology and hematology care.  If you have a lab appointment with the [facility], please come in thru the Main Entrance and check in at the main information desk.  Wear comfortable clothing and clothing appropriate for easy access to any Portacath or PICC line.   We strive to give you quality time with your provider. You may need to reschedule your appointment if you arrive late (15 or more minutes).  Arriving late affects you and other patients whose appointments are after yours.  Also, if you miss three or more appointments without notifying the office, you may be dismissed from the clinic at the provider's discretion.      For prescription refill requests, have your pharmacy contact our office and allow 72 hours for refills to be completed.    Today you received the following chemotherapy and/or immunotherapy agents Tecentriq and 40 mEq potassium chloride p.o x 1 dose   To help prevent nausea and vomiting after your treatment, we encourage you to take your nausea medication as directed.  BELOW ARE SYMPTOMS THAT SHOULD BE REPORTED IMMEDIATELY: *FEVER GREATER THAN 100.4 F (38 C) OR HIGHER *CHILLS OR SWEATING *NAUSEA AND VOMITING THAT IS NOT CONTROLLED WITH YOUR NAUSEA MEDICATION *UNUSUAL SHORTNESS OF BREATH *UNUSUAL BRUISING OR BLEEDING *URINARY PROBLEMS (pain or burning when urinating, or frequent urination) *BOWEL PROBLEMS (unusual diarrhea, constipation, pain near the anus) TENDERNESS IN MOUTH AND THROAT WITH OR WITHOUT PRESENCE OF ULCERS (sore throat, sores in mouth, or a toothache) UNUSUAL RASH, SWELLING OR PAIN  UNUSUAL VAGINAL DISCHARGE OR ITCHING   Items with * indicate a potential emergency and should be followed up as soon as possible or go to the Emergency Department if any problems should occur.  Please show the CHEMOTHERAPY ALERT CARD or  IMMUNOTHERAPY ALERT CARD at check-in to the Emergency Department and triage nurse.  Should you have questions after your visit or need to cancel or reschedule your appointment, please contact [facility] [phone]  and follow the prompts.  Office hours are 8:00 a.m. to 4:30 p.m. Monday - Friday. Please note that voicemails left after 4:00 p.m. may not be returned until the following business day.  We are closed weekends and major holidays. You have access to a nurse at all times for urgent questions. Please call the main number to the clinic [phone] and follow the prompts.  For any non-urgent questions, you may also contact your provider using MyChart. We now offer e-Visits for anyone 5 and older to request care online for non-urgent symptoms. For details visit [email or web address].   Also download the MyChart app! Go to the app store, search "MyChart", open the app, select [facility], and log in with your MyChart username and password.  Masks are optional in the cancer centers. If you would like for your care team to wear a mask while they are taking care of you, please let them know. You may have one support person who is at least 71 years old accompany you for your appointments.

## 2022-06-15 NOTE — Progress Notes (Signed)
Pt presents today for Tecentriq per provider's order. Vital signs and other labs treatment. Pt's potassium is 2.8 today. Provider made aware and stated to re-check potassium in 30 minutes. Okay to proceed with treatment today per I.Thayil-PA.   Re-checked potassium was 2.5. I.Thayil made aware and stated to give 40 mEq potassium chloride p.o x 1 dose and pt to come back tomorrow for potassium chloride IV 40 mEq. Pt refused to return tomorrow for IV potassium chloride, pt stated he would take potassium chloride p.o at home. IJaclynn Major made aware. Pt did receive 40 mEq potassium chloride in the clinic today.   Tecentriq and 40 mEq potassium chloride p.o x 1 dose given today per MD orders. Tolerated infusion without adverse affects. Vital signs stable. No complaints at this time. Discharged from clinic ambulatory in stable condition. Alert and oriented x 3. F/U with [facility] as scheduled.

## 2022-06-15 NOTE — Progress Notes (Signed)
CRITICAL VALUE STICKER  CRITICAL VALUE: K+ 2.5  RECEIVER (on-site recipient of call): Renda Rolls, RN  DATE & TIME NOTIFIED: 06/15/22 @ 2:50 PM  MESSENGER (representative from lab): Ulice Dash  MD NOTIFIED:  Louann Sjogren - PA  TIME OF NOTIFICATION: 2:51 PM per Acquanetta Chain, RN  RESPONSE:  Orders taken by Milagros Loll from [facility]

## 2022-06-16 LAB — T4: T4, Total: 8.2 ug/dL (ref 4.5–12.0)

## 2022-06-18 ENCOUNTER — Other Ambulatory Visit: Payer: Self-pay

## 2022-06-22 ENCOUNTER — Other Ambulatory Visit: Payer: Self-pay

## 2022-06-30 DIAGNOSIS — M79674 Pain in right toe(s): Secondary | ICD-10-CM | POA: Diagnosis not present

## 2022-06-30 DIAGNOSIS — B351 Tinea unguium: Secondary | ICD-10-CM | POA: Diagnosis not present

## 2022-07-06 ENCOUNTER — Inpatient Hospital Stay: Payer: PPO | Attending: Hematology | Admitting: Hematology

## 2022-07-06 ENCOUNTER — Other Ambulatory Visit: Payer: Self-pay

## 2022-07-06 ENCOUNTER — Inpatient Hospital Stay: Payer: PPO

## 2022-07-06 VITALS — BP 119/75 | HR 57 | Temp 97.7°F | Resp 16

## 2022-07-06 DIAGNOSIS — I10 Essential (primary) hypertension: Secondary | ICD-10-CM | POA: Diagnosis not present

## 2022-07-06 DIAGNOSIS — Z5112 Encounter for antineoplastic immunotherapy: Secondary | ICD-10-CM | POA: Insufficient documentation

## 2022-07-06 DIAGNOSIS — F17210 Nicotine dependence, cigarettes, uncomplicated: Secondary | ICD-10-CM | POA: Diagnosis not present

## 2022-07-06 DIAGNOSIS — C3411 Malignant neoplasm of upper lobe, right bronchus or lung: Secondary | ICD-10-CM | POA: Insufficient documentation

## 2022-07-06 DIAGNOSIS — E876 Hypokalemia: Secondary | ICD-10-CM | POA: Insufficient documentation

## 2022-07-06 LAB — COMPREHENSIVE METABOLIC PANEL
ALT: 12 U/L (ref 0–44)
AST: 16 U/L (ref 15–41)
Albumin: 3.6 g/dL (ref 3.5–5.0)
Alkaline Phosphatase: 74 U/L (ref 38–126)
Anion gap: 5 (ref 5–15)
BUN: 11 mg/dL (ref 8–23)
CO2: 30 mmol/L (ref 22–32)
Calcium: 8.2 mg/dL — ABNORMAL LOW (ref 8.9–10.3)
Chloride: 100 mmol/L (ref 98–111)
Creatinine, Ser: 0.72 mg/dL (ref 0.61–1.24)
GFR, Estimated: >60 mL/min (ref >60)
Glucose, Bld: 92 mg/dL (ref 70–99)
Potassium: 3.8 mmol/L (ref 3.5–5.1)
Sodium: 135 mmol/L (ref 135–145)
Total Bilirubin: 0.6 mg/dL (ref 0.3–1.2)
Total Protein: 6.7 g/dL (ref 6.5–8.1)

## 2022-07-06 LAB — CBC WITH DIFFERENTIAL/PLATELET
Abs Immature Granulocytes: 0.02 10*3/uL (ref 0.00–0.07)
Basophils Absolute: 0 10*3/uL (ref 0.0–0.1)
Basophils Relative: 0 %
Eosinophils Absolute: 0.2 10*3/uL (ref 0.0–0.5)
Eosinophils Relative: 3 %
HCT: 43.4 % (ref 39.0–52.0)
Hemoglobin: 14.2 g/dL (ref 13.0–17.0)
Immature Granulocytes: 0 %
Lymphocytes Relative: 12 %
Lymphs Abs: 0.8 10*3/uL (ref 0.7–4.0)
MCH: 32.1 pg (ref 26.0–34.0)
MCHC: 32.7 g/dL (ref 30.0–36.0)
MCV: 98 fL (ref 80.0–100.0)
Monocytes Absolute: 0.5 10*3/uL (ref 0.1–1.0)
Monocytes Relative: 8 %
Neutro Abs: 4.9 10*3/uL (ref 1.7–7.7)
Neutrophils Relative %: 77 %
Platelets: 177 10*3/uL (ref 150–400)
RBC: 4.43 MIL/uL (ref 4.22–5.81)
RDW: 13.9 % (ref 11.5–15.5)
WBC: 6.5 10*3/uL (ref 4.0–10.5)
nRBC: 0 % (ref 0.0–0.2)

## 2022-07-06 LAB — MAGNESIUM: Magnesium: 1.8 mg/dL (ref 1.7–2.4)

## 2022-07-06 MED ORDER — SODIUM CHLORIDE 0.9 % IV SOLN: Freq: Once | INTRAVENOUS | Status: AC

## 2022-07-06 MED ORDER — SODIUM CHLORIDE 0.9% FLUSH
10.0000 mL | INTRAVENOUS | Status: DC | PRN
  Administered 2022-07-06: 10 mL

## 2022-07-06 MED ORDER — SODIUM CHLORIDE 0.9 % IV SOLN
1200.0000 mg | Freq: Once | INTRAVENOUS | Status: AC
  Administered 2022-07-06: 1200 mg via INTRAVENOUS
  Filled 2022-07-06: qty 20

## 2022-07-06 MED ORDER — TRAZODONE HCL 100 MG PO TABS: 100.0000 mg | ORAL_TABLET | Freq: Every day | ORAL | 0 refills | Status: DC

## 2022-07-06 MED ORDER — HEPARIN SOD (PORK) LOCK FLUSH 100 UNIT/ML IV SOLN
500.0000 [IU] | Freq: Once | INTRAVENOUS | Status: AC | PRN
  Administered 2022-07-06: 500 [IU]

## 2022-07-06 NOTE — Progress Notes (Signed)
Larry Moore, [place] [address]   CLINIC:  Medical Oncology/Hematology  PCP:  [name], [place] / [place] [address] [phone]   REASON FOR VISIT:  Follow-up for right lung mass and mediastinal lymphadenopathy  PRIOR THERAPY: 1.  4 cycles of carboplatin, etoposide and Atezolizumab completed on 04/29/2022  NGS Results: not done  CURRENT THERAPY: Maintenance Atezolizumab every 3 weeks  BRIEF ONCOLOGIC HISTORY:  Oncology History  Small cell lung cancer, right upper lobe ([facility])  01/22/2022 Initial Diagnosis   Small cell lung cancer, right upper lobe ([facility])   01/22/2022 Cancer Staging   Staging form: Lung, AJCC 8th Edition - Clinical stage from 01/22/2022: Stage IVB (cT4, cN2, pM1c) - Signed by [name], MD on 02/16/2022 Histopathologic type: Small cell carcinoma, NOS Stage prefix: Initial diagnosis   02/23/2022 - 05/25/2022 Chemotherapy   Patient is on Treatment Plan : LUNG SCLC Carboplatin + Etoposide + Atezolizumab Induction q21d / Atezolizumab Maintenance q21d     02/23/2022 -  Chemotherapy   Patient is on Treatment Plan : LUNG SCLC Carboplatin + Etoposide + Atezolizumab Induction q21d x 4 cycles / Atezolizumab Maintenance q21d       CANCER STAGING:  Cancer Staging  Small cell lung cancer, right upper lobe ([facility]) Staging form: Lung, AJCC 8th Edition - Clinical stage from 01/22/2022: Stage IVB (cT4, cN2, pM1c) - Signed by [name], MD on 02/16/2022   INTERVAL HISTORY:  [name], a 71 y.o. male, seen for follow-up of small cell lung cancer.  Reports energy levels have been down for the past few days.  His wife reports that his energy levels go down 4 to 5 days before each treatment.  He reports that he is not able to sleep well.  He has tried Ambien which is not helping.  Chronic cough is stable.  Occasional lightheadedness in the mornings when he wakes up.   REVIEW OF SYSTEMS:   Review of Systems  Constitutional:  Negative for appetite change and fatigue.  HENT:   Negative for trouble swallowing.   Respiratory:  Positive for cough.   Cardiovascular:  Negative for palpitations.  Gastrointestinal:  Negative for diarrhea.  Neurological:  Positive for dizziness.  Psychiatric/Behavioral:  Positive for sleep disturbance.   All other systems reviewed and are negative.   PAST MEDICAL/SURGICAL HISTORY:  Past Medical History:  Diagnosis Date   Dyspnea    Dysrhythmia    hx a-fib   Hypertension    Insomnia    Pre-diabetes    Sleep apnea    Past Surgical History:  Procedure Laterality Date   ATRIAL FIBRILLATION ABLATION     approx 2020   CARDIAC CATHETERIZATION     HERNIA REPAIR     IR IMAGING GUIDED PORT INSERTION  02/17/2022   IR VENO/JUGULAR RIGHT  02/17/2022   VIDEO BRONCHOSCOPY WITH ENDOBRONCHIAL ULTRASOUND Bilateral 01/29/2022   Procedure: VIDEO BRONCHOSCOPY WITH ENDOBRONCHIAL ULTRASOUND;  Surgeon: Garner Nash, DO;  Location: [facility];  Service: Cardiopulmonary;  Laterality: Bilateral;  w/ Guardant [id number]    SOCIAL HISTORY:  Social History   Socioeconomic History   Marital status: Married    Spouse name: Not on file   Number of children: Not on file   Years of education: Not on file   Highest education level: Not on file  Occupational History   Occupation: Retired  Tobacco Use   Smoking status: Every Day    Types: Cigarettes  Passive exposure: Current   Smokeless tobacco: Not on file   Tobacco comments:    Pt smokes 1 ppd. 01/26/22  Vaping Use   Vaping Use: Never used  Substance and Sexual Activity   Alcohol use: Not Currently    Comment: 1 bottle wine a day    Drug use: No   Sexual activity: Not on file  Other Topics Concern   Not on file  Social History Narrative   Not on file   Social Determinants of Health   Financial Resource Strain: Not on file  Food Insecurity: Not on file  Transportation Needs: Not on file  Physical  Activity: Not on file  Stress: Not on file  Social Connections: Not on file  Intimate Partner Violence: Not on file    FAMILY HISTORY:  Family History  Problem Relation Age of Onset   Coronary artery disease Father    Diabetes Father     CURRENT MEDICATIONS:  Current Outpatient Medications  Medication Sig Dispense Refill   ALPRAZolam (XANAX) 1 MG tablet Take 1 tablet (1 mg total) by mouth at bedtime as needed for anxiety. 30 tablet 3   Atezolizumab (TECENTRIQ IV) Inject into the vein every 21 ( twenty-one) days.     baclofen (LIORESAL) 10 MG tablet Take 1-2 tablets (10-20 mg total) by mouth 3 (three) times daily. 180 each 0   furosemide (LASIX) 80 MG tablet Take 80 mg by mouth daily.     lidocaine (XYLOCAINE) 2 % solution Use as directed 15 mLs in the mouth or throat as needed for mouth pain (prior to meals/supplements to decrease pain with swallowing). Mix 1:1 with Maalox 100 mL 6   lidocaine-prilocaine (EMLA) cream Apply topically as directed.     metolazone (ZAROXOLYN) 2.5 MG tablet Take 2.5 mg by mouth daily.     nystatin (MYCOSTATIN) 100000 UNIT/ML suspension Take 5 mLs (500,000 Units total) by mouth 4 (four) times daily. 120 mL 3   potassium chloride (KLOR-CON M) 10 MEQ tablet Take 1 tablet (10 mEq total) by mouth 2 (two) times daily. 180 tablet 3   sotalol (BETAPACE) 80 MG tablet SMARTSIG:1 Tablet(s) By Mouth Every 12 Hours     sucralfate (CARAFATE) 1 GM/10ML suspension TAKE 10MLS BY MOUTH 4 TIMES DAILY AS NEEDED. 480 mL 6   Vitamin D, Ergocalciferol, (DRISDOL) 1.25 MG (50000 UNIT) CAPS capsule Take 50,000 Units by mouth daily.     zolpidem (AMBIEN) 10 MG tablet Take 1 tablet (10 mg total) by mouth at bedtime as needed for sleep. 30 tablet 0   No current facility-administered medications for this visit.    ALLERGIES:  No Known Allergies  PHYSICAL EXAM:  Performance status (ECOG): 1 - Symptomatic but completely ambulatory  There were no vitals filed for this visit.  Wt  Readings from Last 3 Encounters:  06/15/22 230 lb 9.6 oz (104.6 kg)  05/25/22 239 lb 3.2 oz (108.5 kg)  04/29/22 236 lb (107 kg)   Physical Exam Vitals reviewed.  Constitutional:      Appearance: Normal appearance.  Cardiovascular:     Rate and Rhythm: Normal rate and regular rhythm.     Pulses: Normal pulses.     Heart sounds: Normal heart sounds.  Pulmonary:     Effort: Pulmonary effort is normal.     Breath sounds: Normal breath sounds.  Neurological:     General: No focal deficit present.     Mental Status: He is alert and oriented to person, place, and time.  Psychiatric:        Mood and Affect: Mood normal.        Behavior: Behavior normal.    LABORATORY DATA:  I have reviewed the labs as listed.     Latest Ref Rng & Units 06/15/2022   12:10 PM 05/25/2022    9:51 AM 04/29/2022    9:49 AM  CBC  WBC 4.0 - 10.5 K/uL 7.8  6.3  10.2   Hemoglobin 13.0 - 17.0 g/dL 14.7  11.7  11.6   Hematocrit 39.0 - 52.0 % 43.7  35.5  35.2   Platelets 150 - 400 K/uL 179  304  278       Latest Ref Rng & Units 06/15/2022    1:48 PM 06/15/2022   12:10 PM 05/25/2022    9:51 AM  CMP  Glucose 70 - 99 mg/dL  160  99   BUN 8 - 23 mg/dL  15  9   Creatinine 0.61 - 1.24 mg/dL  0.78  0.54   Sodium 135 - 145 mmol/L  138  137   Potassium 3.5 - 5.1 mmol/L 2.5  2.8  3.5   Chloride 98 - 111 mmol/L  91  99   CO2 22 - 32 mmol/L  36  32   Calcium 8.9 - 10.3 mg/dL  8.8  8.1   Total Protein 6.5 - 8.1 g/dL  7.6  6.4   Total Bilirubin 0.3 - 1.2 mg/dL  0.9  0.7   Alkaline Phos 38 - 126 U/L  80  73   AST 15 - 41 U/L  16  11   ALT 0 - 44 U/L  11  11     DIAGNOSTIC IMAGING:  I have independently reviewed the scans and discussed with the patient. No results found.   ASSESSMENT:  Extensive stage small cell lung cancer: - Productive cough for 3 weeks with hemoptysis.  Hemoptysis improved after Eliquis stopped 2 weeks ago.  Still has some purulent sputum.  Currently on Levaquin. - Abnormal chest x-ray on  01/19/2022 for productive cough - CT chest with contrast (01/21/2022): Right hilar mass measuring 6.5 x 5.2 cm surrounding and compressing the right upper lobe bronchus, bronchus intermedius, right middle lobe bronchus and right lower lobe bronchus centrally.  Compression of the right lower lobe pulmonary artery.  Nodular soft tissue lesion in the right lower lobe continuous with right hilar mass and could represent endobronchial spread.  Enlarged mediastinal lymph nodes but no findings of pulmonary metastatic disease.  Indeterminate bilateral adrenal gland nodules and low attenuation liver lesions, likely benign cyst. - No weight loss.  No chest pains. - PET scan: Right hilar mass involving upper lobe, middle lobe and lower lobe with satellite nodularity in the right lower lobe, hypermetabolic subcarinal and right paratracheal lymphadenopathy.  Pleural-based nodule along the right posterior costophrenic angle.  Hypermetabolic left adrenal lesion.  Small hypermetabolic lesions in the humerus bilaterally and a subcutaneous focus in the right posterior lower back. - Right lung hilar mass biopsy (01/29/2022): Small cell carcinoma -4 cycles of carboplatin, etoposide and atezolizumab from 02/23/2022 through 04/29/2022, maintenance Atezolizumab ongoing    Social/family history: - He lives at home with his wife Larry Moore.  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  Retired 8 years ago.  Smoked 1 pack/day for 50 years. - Father had renal cell carcinoma.   PLAN:  Extensive stage small cell lung cancer: -CT CAP on 04/28/2022: Treatment response with significant improvement in mediastinal and hilar  adenopathy.  Right lower lobe lung lesion is also much improved.  Resolution of the left adrenal lesion. -I have reviewed labs today which showed normal LFTs and creatinine.  CBC was grossly normal.  Last TSH was 0.8. -Proceed with maintenance Atezolizumab today and in 3 weeks.  RTC 6 weeks for follow-up.  Plan to  repeat CT CAP and brain MRI prior to next visit.  2.  Difficulty sleeping: -Ambien and Xanax did not help.  We will start him on trazodone 100 mg at bedtime as needed and titrate up.  3.  Hypokalemia: -Continue potassium supplements daily.  Potassium today is normal.   Orders placed this encounter:  No orders of the defined types were placed in this encounter.    [name], MD [facility] [phone]

## 2022-07-06 NOTE — Progress Notes (Signed)
Pt presents today for Tecentriq per provider's order. Vital signs and labs WNL for treatment. Okay to proceed with treatment today per Dr.K.  Tecentriq given today per MD orders. Tolerated infusion without adverse affects. Vital signs stable. No complaints at this time. Discharged from clinic ambulatory in stable condition. Alert and oriented x 3. F/U with [facility] as scheduled.

## 2022-07-06 NOTE — Patient Instructions (Signed)
[facility] at [facility] Discharge Instructions   You were seen and examined today by Dr. Delton Coombes.  He reviewed the results of your lab work which are normal/stable.   We will proceed with your treatment today.  We sent a prescription to your pharmacy for trazodone to help with insomnia.   Return as scheduled.      Thank you for choosing [facility] at [facility] to provide your oncology and hematology care.  To afford each patient quality time with our provider, please arrive at least 15 minutes before your scheduled appointment time.   If you have a lab appointment with the [facility] please come in thru the Main Entrance and check in at the main information desk.  You need to re-schedule your appointment should you arrive 10 or more minutes late.  We strive to give you quality time with our providers, and arriving late affects you and other patients whose appointments are after yours.  Also, if you no show three or more times for appointments you may be dismissed from the clinic at the providers discretion.     Again, thank you for choosing [facility].  Our hope is that these requests will decrease the amount of time that you wait before being seen by our physicians.       _____________________________________________________________  Should you have questions after your visit to [facility], please contact our office at [phone] and follow the prompts.  Our office hours are 8:00 a.m. and 4:30 p.m. Monday - Friday.  Please note that voicemails left after 4:00 p.m. may not be returned until the following business day.  We are closed weekends and major holidays.  You do have access to a nurse 24-7, just call the main number to the clinic [phone] and do not press any options, hold on the line and a nurse will answer the phone.    For prescription refill requests, have your pharmacy contact our office  and allow 72 hours.    Due to Covid, you will need to wear a mask upon entering the hospital. If you do not have a mask, a mask will be given to you at the Main Entrance upon arrival. For doctor visits, patients may have 1 support person age 61 or older with them. For treatment visits, patients can not have anyone with them due to social distancing guidelines and our immunocompromised population.

## 2022-07-06 NOTE — Progress Notes (Signed)
Patient has been examined by Dr. Katragadda, and vital signs and labs have been reviewed. ANC, Creatinine, LFTs, hemoglobin, and platelets are within treatment parameters per M.D. - pt may proceed with treatment.  Primary RN and pharmacy notified.  

## 2022-07-06 NOTE — Patient Instructions (Signed)
[facility]  Discharge Instructions: Thank you for choosing [facility] to provide your oncology and hematology care.  If you have a lab appointment with the [facility], please come in thru the Main Entrance and check in at the main information desk.  Wear comfortable clothing and clothing appropriate for easy access to any Portacath or PICC line.   We strive to give you quality time with your provider. You may need to reschedule your appointment if you arrive late (15 or more minutes).  Arriving late affects you and other patients whose appointments are after yours.  Also, if you miss three or more appointments without notifying the office, you may be dismissed from the clinic at the provider's discretion.      For prescription refill requests, have your pharmacy contact our office and allow 72 hours for refills to be completed.    Today you received the following chemotherapy and/or immunotherapy agents Tecentriq   To help prevent nausea and vomiting after your treatment, we encourage you to take your nausea medication as directed.  Atezolizumab Injection What is this medication? ATEZOLIZUMAB (a te zoe LIZ ue mab) treats some types of cancer. It works by helping your immune system slow or stop the spread of cancer cells. It is a monoclonal antibody. This medicine may be used for other purposes; ask your health care provider or pharmacist if you have questions. COMMON BRAND NAME(S): Tecentriq What should I tell my care team before I take this medication? They need to know if you have any of these conditions: Allogeneic stem cell transplant (uses someone else's stem cells) Autoimmune diseases, such as Crohn disease, ulcerative colitis, lupus History of chest radiation Nervous system problems, such as Guillain-Barre syndrome, myasthenia gravis Organ transplant An unusual or allergic reaction to atezolizumab, other medications, foods, dyes, or  preservatives Pregnant or trying to get pregnant Breast-feeding How should I use this medication? This medication is injected into a vein. It is given by your care team in a hospital or clinic setting. A special MedGuide will be given to you before each treatment. Be sure to read this information carefully each time. Talk to your care team about the use of this medication in children. While it may be prescribed for children as young as 2 years for selected conditions, precautions do apply. Overdosage: If you think you have taken too much of this medicine contact a poison control center or emergency room at once. NOTE: This medicine is only for you. Do not share this medicine with others. What if I miss a dose? Keep appointments for follow-up doses. It is important not to miss your dose. Call your care team if you are unable to keep an appointment. What may interact with this medication? Interactions have not been studied. This list may not describe all possible interactions. Give your health care provider a list of all the medicines, herbs, non-prescription drugs, or dietary supplements you use. Also tell them if you smoke, drink alcohol, or use illegal drugs. Some items may interact with your medicine. What should I watch for while using this medication? Your condition will be monitored carefully while you are receiving this medication. You may need blood work while taking this medication. This medication may cause serious skin reactions. They can happen weeks to months after starting the medication. Contact your care team right away if you notice fevers or flu-like symptoms with a rash. The rash may be red or purple and then turn  into blisters or peeling of the skin. You may also notice a red rash with swelling of the face, lips, or lymph nodes in your neck or under your arms. Tell your care team right away if you have any change in your eyesight. Talk to your care team if you may be pregnant.  Serious birth defects can occur if you take this medication during pregnancy and for 5 months after the last dose. You will need a negative pregnancy test before starting this medication. Contraception is recommended while taking this medication and for 5 months after the last dose. Your care team can help you find the option that works for you. Do not breastfeed while taking this medication and for at least 5 months after the last dose. What side effects may I notice from receiving this medication? Side effects that you should report to your doctor or health care professional as soon as possible: Allergic reactions--skin rash, itching, hives, swelling of the face, lips, tongue, or throat Dry cough, shortness of breath or trouble breathing Eye pain, redness, irritation, or discharge with blurry or decreased vision Heart muscle inflammation--unusual weakness or fatigue, shortness of breath, chest pain, fast or irregular heartbeat, dizziness, swelling of the ankles, feet, or hands Hormone gland problems--headache, sensitivity to light, unusual weakness or fatigue, dizziness, fast or irregular heartbeat, increased sensitivity to cold or heat, excessive sweating, constipation, hair loss, increased thirst or amount of urine, tremors or shaking, irritability Infusion reactions--chest pain, shortness of breath or trouble breathing, feeling faint or lightheaded Kidney injury (glomerulonephritis)--decrease in the amount of urine, red or dark brown urine, foamy or bubbly urine, swelling of the ankles, hands, or feet Liver injury--right upper belly pain, loss of appetite, nausea, light-colored stool, dark yellow or brown urine, yellowing skin or eyes, unusual weakness or fatigue Pain, tingling, or numbness in the hands or feet, muscle weakness, change in vision, confusion or trouble speaking, loss of balance or coordination, trouble walking, seizures Rash, fever, and swollen lymph nodes Redness, blistering,  peeling, or loosening of the skin, including inside the mouth Sudden or severe stomach pain, bloody diarrhea, fever, nausea, vomiting Side effects that usually do not require medical attention (report to your doctor or health care professional if they continue or are bothersome): Bone, joint, or muscle pain Diarrhea Fatigue Loss of appetite Nausea Skin rash This list may not describe all possible side effects. Call your doctor for medical advice about side effects. You may report side effects to FDA at 1-800-FDA-1088. Where should I keep my medication? This medication is given in a hospital or clinic. It will not be stored at home. NOTE: This sheet is a summary. It may not cover all possible information. If you have questions about this medicine, talk to your doctor, pharmacist, or health care provider.  2023 Elsevier/Gold Standard (2022-02-03 00:00:00)   BELOW ARE SYMPTOMS THAT SHOULD BE REPORTED IMMEDIATELY: *FEVER GREATER THAN 100.4 F (38 C) OR HIGHER *CHILLS OR SWEATING *NAUSEA AND VOMITING THAT IS NOT CONTROLLED WITH YOUR NAUSEA MEDICATION *UNUSUAL SHORTNESS OF BREATH *UNUSUAL BRUISING OR BLEEDING *URINARY PROBLEMS (pain or burning when urinating, or frequent urination) *BOWEL PROBLEMS (unusual diarrhea, constipation, pain near the anus) TENDERNESS IN MOUTH AND THROAT WITH OR WITHOUT PRESENCE OF ULCERS (sore throat, sores in mouth, or a toothache) UNUSUAL RASH, SWELLING OR PAIN  UNUSUAL VAGINAL DISCHARGE OR ITCHING   Items with * indicate a potential emergency and should be followed up as soon as possible or go to the Emergency Department if  any problems should occur.  Please show the CHEMOTHERAPY ALERT CARD or IMMUNOTHERAPY ALERT CARD at check-in to the Emergency Department and triage nurse.  Should you have questions after your visit or need to cancel or reschedule your appointment, please contact [facility] [phone]  and follow the prompts.  Office  hours are 8:00 a.m. to 4:30 p.m. Monday - Friday. Please note that voicemails left after 4:00 p.m. may not be returned until the following business day.  We are closed weekends and major holidays. You have access to a nurse at all times for urgent questions. Please call the main number to the clinic [phone] and follow the prompts.  For any non-urgent questions, you may also contact your provider using MyChart. We now offer e-Visits for anyone 61 and older to request care online for non-urgent symptoms. For details visit [email or web address].   Also download the MyChart app! Go to the app store, search "MyChart", open the app, select [facility], and log in with your MyChart username and password.  Masks are optional in the cancer centers. If you would like for your care team to wear a mask while they are taking care of you, please let them know. You may have one support person who is at least 71 years old accompany you for your appointments.

## 2022-07-07 ENCOUNTER — Other Ambulatory Visit: Payer: Self-pay

## 2022-07-08 ENCOUNTER — Other Ambulatory Visit: Payer: Self-pay

## 2022-07-10 ENCOUNTER — Other Ambulatory Visit: Payer: Self-pay

## 2022-07-11 ENCOUNTER — Other Ambulatory Visit: Payer: Self-pay

## 2022-07-27 ENCOUNTER — Inpatient Hospital Stay: Payer: PPO

## 2022-07-27 VITALS — BP 110/71 | HR 76 | Temp 97.1°F | Resp 20 | Wt 241.2 lb

## 2022-07-27 DIAGNOSIS — Z5112 Encounter for antineoplastic immunotherapy: Secondary | ICD-10-CM | POA: Diagnosis not present

## 2022-07-27 DIAGNOSIS — C3411 Malignant neoplasm of upper lobe, right bronchus or lung: Secondary | ICD-10-CM

## 2022-07-27 LAB — COMPREHENSIVE METABOLIC PANEL
ALT: 11 U/L (ref 0–44)
AST: 15 U/L (ref 15–41)
Albumin: 3.7 g/dL (ref 3.5–5.0)
Alkaline Phosphatase: 71 U/L (ref 38–126)
Anion gap: 9 (ref 5–15)
BUN: 13 mg/dL (ref 8–23)
CO2: 31 mmol/L (ref 22–32)
Calcium: 8.4 mg/dL — ABNORMAL LOW (ref 8.9–10.3)
Chloride: 99 mmol/L (ref 98–111)
Creatinine, Ser: 0.71 mg/dL (ref 0.61–1.24)
GFR, Estimated: >60 mL/min (ref >60)
Glucose, Bld: 101 mg/dL — ABNORMAL HIGH (ref 70–99)
Potassium: 3.6 mmol/L (ref 3.5–5.1)
Sodium: 139 mmol/L (ref 135–145)
Total Bilirubin: 0.8 mg/dL (ref 0.3–1.2)
Total Protein: 6.8 g/dL (ref 6.5–8.1)

## 2022-07-27 LAB — CBC WITH DIFFERENTIAL/PLATELET
Abs Immature Granulocytes: 0.02 10*3/uL (ref 0.00–0.07)
Basophils Absolute: 0 10*3/uL (ref 0.0–0.1)
Basophils Relative: 0 %
Eosinophils Absolute: 0.2 10*3/uL (ref 0.0–0.5)
Eosinophils Relative: 4 %
HCT: 44.1 % (ref 39.0–52.0)
Hemoglobin: 14.7 g/dL (ref 13.0–17.0)
Immature Granulocytes: 0 %
Lymphocytes Relative: 14 %
Lymphs Abs: 0.8 10*3/uL (ref 0.7–4.0)
MCH: 31.5 pg (ref 26.0–34.0)
MCHC: 33.3 g/dL (ref 30.0–36.0)
MCV: 94.6 fL (ref 80.0–100.0)
Monocytes Absolute: 0.5 10*3/uL (ref 0.1–1.0)
Monocytes Relative: 8 %
Neutro Abs: 4.2 10*3/uL (ref 1.7–7.7)
Neutrophils Relative %: 74 %
Platelets: 184 10*3/uL (ref 150–400)
RBC: 4.66 MIL/uL (ref 4.22–5.81)
RDW: 14.2 % (ref 11.5–15.5)
WBC: 5.8 10*3/uL (ref 4.0–10.5)
nRBC: 0 % (ref 0.0–0.2)

## 2022-07-27 LAB — MAGNESIUM: Magnesium: 1.9 mg/dL (ref 1.7–2.4)

## 2022-07-27 MED ORDER — HEPARIN SOD (PORK) LOCK FLUSH 100 UNIT/ML IV SOLN
500.0000 [IU] | Freq: Once | INTRAVENOUS | Status: AC | PRN
  Administered 2022-07-27: 500 [IU]

## 2022-07-27 MED ORDER — SODIUM CHLORIDE 0.9 % IV SOLN: Freq: Once | INTRAVENOUS | Status: AC

## 2022-07-27 MED ORDER — SODIUM CHLORIDE 0.9 % IV SOLN
1200.0000 mg | Freq: Once | INTRAVENOUS | Status: AC
  Administered 2022-07-27: 1200 mg via INTRAVENOUS
  Filled 2022-07-27: qty 20

## 2022-07-27 MED ORDER — SODIUM CHLORIDE 0.9% FLUSH
10.0000 mL | INTRAVENOUS | Status: DC | PRN
  Administered 2022-07-27: 10 mL

## 2022-07-27 NOTE — Progress Notes (Signed)
Port flush with good blood return. Patient stable through access and blood draw. Patient to remain accessed through treatment.

## 2022-07-27 NOTE — Progress Notes (Signed)
Patient tolerated therapy with no complaints voiced.  Side effects with management reviewed with understanding verbalized.  Port site clean and dry with no bruising or swelling noted at site.  Good blood return noted before and after administration of therapy.  Band aid applied.  Patient left in satisfactory condition with VSS and no s/s of distress noted.

## 2022-07-27 NOTE — Patient Instructions (Signed)
[facility]  Discharge Instructions: Thank you for choosing [facility] to provide your oncology and hematology care.  If you have a lab appointment with the [facility], please come in thru the Main Entrance and check in at the main information desk.  Wear comfortable clothing and clothing appropriate for easy access to any Portacath or PICC line.   We strive to give you quality time with your provider. You may need to reschedule your appointment if you arrive late (15 or more minutes).  Arriving late affects you and other patients whose appointments are after yours.  Also, if you miss three or more appointments without notifying the office, you may be dismissed from the clinic at the provider's discretion.      For prescription refill requests, have your pharmacy contact our office and allow 72 hours for refills to be completed.    Today you received the following chemotherapy and/or immunotherapy agents tecentriq.  Atezolizumab Injection What is this medication? ATEZOLIZUMAB (a te zoe LIZ ue mab) treats some types of cancer. It works by helping your immune system slow or stop the spread of cancer cells. It is a monoclonal antibody. This medicine may be used for other purposes; ask your health care provider or pharmacist if you have questions. COMMON BRAND NAME(S): Tecentriq What should I tell my care team before I take this medication? They need to know if you have any of these conditions: Allogeneic stem cell transplant (uses someone else's stem cells) Autoimmune diseases, such as Crohn disease, ulcerative colitis, lupus History of chest radiation Nervous system problems, such as Guillain-Barre syndrome, myasthenia gravis Organ transplant An unusual or allergic reaction to atezolizumab, other medications, foods, dyes, or preservatives Pregnant or trying to get pregnant Breast-feeding How should I use this medication? This medication is injected  into a vein. It is given by your care team in a hospital or clinic setting. A special MedGuide will be given to you before each treatment. Be sure to read this information carefully each time. Talk to your care team about the use of this medication in children. While it may be prescribed for children as young as 2 years for selected conditions, precautions do apply. Overdosage: If you think you have taken too much of this medicine contact a poison control center or emergency room at once. NOTE: This medicine is only for you. Do not share this medicine with others. What if I miss a dose? Keep appointments for follow-up doses. It is important not to miss your dose. Call your care team if you are unable to keep an appointment. What may interact with this medication? Interactions have not been studied. This list may not describe all possible interactions. Give your health care provider a list of all the medicines, herbs, non-prescription drugs, or dietary supplements you use. Also tell them if you smoke, drink alcohol, or use illegal drugs. Some items may interact with your medicine. What should I watch for while using this medication? Your condition will be monitored carefully while you are receiving this medication. You may need blood work while taking this medication. This medication may cause serious skin reactions. They can happen weeks to months after starting the medication. Contact your care team right away if you notice fevers or flu-like symptoms with a rash. The rash may be red or purple and then turn into blisters or peeling of the skin. You may also notice a red rash with swelling of the face, lips, or  lymph nodes in your neck or under your arms. Tell your care team right away if you have any change in your eyesight. Talk to your care team if you may be pregnant. Serious birth defects can occur if you take this medication during pregnancy and for 5 months after the last dose. You will need a  negative pregnancy test before starting this medication. Contraception is recommended while taking this medication and for 5 months after the last dose. Your care team can help you find the option that works for you. Do not breastfeed while taking this medication and for at least 5 months after the last dose. What side effects may I notice from receiving this medication? Side effects that you should report to your doctor or health care professional as soon as possible: Allergic reactions--skin rash, itching, hives, swelling of the face, lips, tongue, or throat Dry cough, shortness of breath or trouble breathing Eye pain, redness, irritation, or discharge with blurry or decreased vision Heart muscle inflammation--unusual weakness or fatigue, shortness of breath, chest pain, fast or irregular heartbeat, dizziness, swelling of the ankles, feet, or hands Hormone gland problems--headache, sensitivity to light, unusual weakness or fatigue, dizziness, fast or irregular heartbeat, increased sensitivity to cold or heat, excessive sweating, constipation, hair loss, increased thirst or amount of urine, tremors or shaking, irritability Infusion reactions--chest pain, shortness of breath or trouble breathing, feeling faint or lightheaded Kidney injury (glomerulonephritis)--decrease in the amount of urine, red or dark brown urine, foamy or bubbly urine, swelling of the ankles, hands, or feet Liver injury--right upper belly pain, loss of appetite, nausea, light-colored stool, dark yellow or brown urine, yellowing skin or eyes, unusual weakness or fatigue Pain, tingling, or numbness in the hands or feet, muscle weakness, change in vision, confusion or trouble speaking, loss of balance or coordination, trouble walking, seizures Rash, fever, and swollen lymph nodes Redness, blistering, peeling, or loosening of the skin, including inside the mouth Sudden or severe stomach pain, bloody diarrhea, fever, nausea,  vomiting Side effects that usually do not require medical attention (report to your doctor or health care professional if they continue or are bothersome): Bone, joint, or muscle pain Diarrhea Fatigue Loss of appetite Nausea Skin rash This list may not describe all possible side effects. Call your doctor for medical advice about side effects. You may report side effects to FDA at 1-800-FDA-1088. Where should I keep my medication? This medication is given in a hospital or clinic. It will not be stored at home. NOTE: This sheet is a summary. It may not cover all possible information. If you have questions about this medicine, talk to your doctor, pharmacist, or health care provider.  2023 Elsevier/Gold Standard (2022-02-03 00:00:00)       To help prevent nausea and vomiting after your treatment, we encourage you to take your nausea medication as directed.  BELOW ARE SYMPTOMS THAT SHOULD BE REPORTED IMMEDIATELY: *FEVER GREATER THAN 100.4 F (38 C) OR HIGHER *CHILLS OR SWEATING *NAUSEA AND VOMITING THAT IS NOT CONTROLLED WITH YOUR NAUSEA MEDICATION *UNUSUAL SHORTNESS OF BREATH *UNUSUAL BRUISING OR BLEEDING *URINARY PROBLEMS (pain or burning when urinating, or frequent urination) *BOWEL PROBLEMS (unusual diarrhea, constipation, pain near the anus) TENDERNESS IN MOUTH AND THROAT WITH OR WITHOUT PRESENCE OF ULCERS (sore throat, sores in mouth, or a toothache) UNUSUAL RASH, SWELLING OR PAIN  UNUSUAL VAGINAL DISCHARGE OR ITCHING   Items with * indicate a potential emergency and should be followed up as soon as possible or go to the  Emergency Department if any problems should occur.  Please show the CHEMOTHERAPY ALERT CARD or IMMUNOTHERAPY ALERT CARD at check-in to the Emergency Department and triage nurse.  Should you have questions after your visit or need to cancel or reschedule your appointment, please contact [facility] [phone]  and follow the prompts.  Office  hours are 8:00 a.m. to 4:30 p.m. Monday - Friday. Please note that voicemails left after 4:00 p.m. may not be returned until the following business day.  We are closed weekends and major holidays. You have access to a nurse at all times for urgent questions. Please call the main number to the clinic [phone] and follow the prompts.  For any non-urgent questions, you may also contact your provider using MyChart. We now offer e-Visits for anyone 49 and older to request care online for non-urgent symptoms. For details visit [email or web address].   Also download the MyChart app! Go to the app store, search "MyChart", open the app, select [facility], and log in with your MyChart username and password.  Masks are optional in the cancer centers. If you would like for your care team to wear a mask while they are taking care of you, please let them know. You may have one support person who is at least 71 years old accompany you for your appointments.

## 2022-07-28 ENCOUNTER — Ambulatory Visit: Payer: PPO | Admitting: Hematology

## 2022-07-28 ENCOUNTER — Other Ambulatory Visit: Payer: PPO

## 2022-07-28 ENCOUNTER — Ambulatory Visit: Payer: PPO

## 2022-07-31 ENCOUNTER — Other Ambulatory Visit: Payer: Self-pay

## 2022-08-01 ENCOUNTER — Other Ambulatory Visit: Payer: Self-pay

## 2022-08-10 ENCOUNTER — Ambulatory Visit (HOSPITAL_COMMUNITY)
Admission: RE | Admit: 2022-08-10 | Discharge: 2022-08-10 | Disposition: A | Discharge status: Home / Self Care | Payer: PPO | Source: Ambulatory Visit | Attending: Hematology | Admitting: Hematology

## 2022-08-10 DIAGNOSIS — C3411 Malignant neoplasm of upper lobe, right bronchus or lung: Secondary | ICD-10-CM | POA: Insufficient documentation

## 2022-08-10 DIAGNOSIS — I639 Cerebral infarction, unspecified: Secondary | ICD-10-CM | POA: Diagnosis not present

## 2022-08-10 DIAGNOSIS — K769 Liver disease, unspecified: Secondary | ICD-10-CM | POA: Diagnosis not present

## 2022-08-10 DIAGNOSIS — K5790 Diverticulosis of intestine, part unspecified, without perforation or abscess without bleeding: Secondary | ICD-10-CM | POA: Diagnosis not present

## 2022-08-10 DIAGNOSIS — J3489 Other specified disorders of nose and nasal sinuses: Secondary | ICD-10-CM | POA: Diagnosis not present

## 2022-08-10 DIAGNOSIS — J439 Emphysema, unspecified: Secondary | ICD-10-CM | POA: Diagnosis not present

## 2022-08-10 DIAGNOSIS — C3490 Malignant neoplasm of unspecified part of unspecified bronchus or lung: Secondary | ICD-10-CM | POA: Diagnosis not present

## 2022-08-10 DIAGNOSIS — I3139 Other pericardial effusion (noninflammatory): Secondary | ICD-10-CM | POA: Diagnosis not present

## 2022-08-10 DIAGNOSIS — K4020 Bilateral inguinal hernia, without obstruction or gangrene, not specified as recurrent: Secondary | ICD-10-CM | POA: Diagnosis not present

## 2022-08-10 DIAGNOSIS — I700 Atherosclerosis of aorta: Secondary | ICD-10-CM | POA: Diagnosis not present

## 2022-08-10 MED ORDER — IOHEXOL 300 MG/ML  SOLN
100.0000 mL | Freq: Once | INTRAMUSCULAR | Status: AC | PRN
  Administered 2022-08-10: 100 mL via INTRAVENOUS

## 2022-08-10 MED ORDER — GADOBUTROL 1 MMOL/ML IV SOLN
10.0000 mL | Freq: Once | INTRAVENOUS | Status: AC | PRN
  Administered 2022-08-10: 10 mL via INTRAVENOUS

## 2022-08-18 ENCOUNTER — Inpatient Hospital Stay: Payer: PPO

## 2022-08-18 ENCOUNTER — Inpatient Hospital Stay: Payer: PPO | Attending: Hematology | Admitting: Hematology

## 2022-08-18 VITALS — BP 130/82 | Temp 97.8°F

## 2022-08-18 DIAGNOSIS — I10 Essential (primary) hypertension: Secondary | ICD-10-CM | POA: Insufficient documentation

## 2022-08-18 DIAGNOSIS — F17210 Nicotine dependence, cigarettes, uncomplicated: Secondary | ICD-10-CM | POA: Insufficient documentation

## 2022-08-18 DIAGNOSIS — E876 Hypokalemia: Secondary | ICD-10-CM | POA: Diagnosis not present

## 2022-08-18 DIAGNOSIS — C3411 Malignant neoplasm of upper lobe, right bronchus or lung: Secondary | ICD-10-CM

## 2022-08-18 DIAGNOSIS — Z5112 Encounter for antineoplastic immunotherapy: Secondary | ICD-10-CM | POA: Insufficient documentation

## 2022-08-18 LAB — CBC WITH DIFFERENTIAL/PLATELET
Abs Immature Granulocytes: 0.03 10*3/uL (ref 0.00–0.07)
Basophils Absolute: 0 10*3/uL (ref 0.0–0.1)
Basophils Relative: 0 %
Eosinophils Absolute: 0.2 10*3/uL (ref 0.0–0.5)
Eosinophils Relative: 4 %
HCT: 44.7 % (ref 39.0–52.0)
Hemoglobin: 15 g/dL (ref 13.0–17.0)
Immature Granulocytes: 1 %
Lymphocytes Relative: 15 %
Lymphs Abs: 0.9 10*3/uL (ref 0.7–4.0)
MCH: 30.9 pg (ref 26.0–34.0)
MCHC: 33.6 g/dL (ref 30.0–36.0)
MCV: 92.2 fL (ref 80.0–100.0)
Monocytes Absolute: 0.4 10*3/uL (ref 0.1–1.0)
Monocytes Relative: 8 %
Neutro Abs: 4.1 10*3/uL (ref 1.7–7.7)
Neutrophils Relative %: 72 %
Platelets: 167 10*3/uL (ref 150–400)
RBC: 4.85 MIL/uL (ref 4.22–5.81)
RDW: 14 % (ref 11.5–15.5)
WBC: 5.6 10*3/uL (ref 4.0–10.5)
nRBC: 0 % (ref 0.0–0.2)

## 2022-08-18 LAB — COMPREHENSIVE METABOLIC PANEL
ALT: 10 U/L (ref 0–44)
AST: 12 U/L — ABNORMAL LOW (ref 15–41)
Albumin: 3.6 g/dL (ref 3.5–5.0)
Alkaline Phosphatase: 72 U/L (ref 38–126)
Anion gap: 5 (ref 5–15)
BUN: 10 mg/dL (ref 8–23)
CO2: 29 mmol/L (ref 22–32)
Calcium: 8.3 mg/dL — ABNORMAL LOW (ref 8.9–10.3)
Chloride: 104 mmol/L (ref 98–111)
Creatinine, Ser: 0.66 mg/dL (ref 0.61–1.24)
GFR, Estimated: >60 mL/min (ref >60)
Glucose, Bld: 94 mg/dL (ref 70–99)
Potassium: 3.7 mmol/L (ref 3.5–5.1)
Sodium: 138 mmol/L (ref 135–145)
Total Bilirubin: 1 mg/dL (ref 0.3–1.2)
Total Protein: 6.9 g/dL (ref 6.5–8.1)

## 2022-08-18 LAB — MAGNESIUM: Magnesium: 1.7 mg/dL (ref 1.7–2.4)

## 2022-08-18 MED ORDER — SODIUM CHLORIDE 0.9% FLUSH
10.0000 mL | INTRAVENOUS | Status: AC
  Administered 2022-08-18: 10 mL

## 2022-08-18 MED ORDER — SODIUM CHLORIDE 0.9 % IV SOLN
1200.0000 mg | Freq: Once | INTRAVENOUS | Status: AC
  Administered 2022-08-18: 1200 mg via INTRAVENOUS
  Filled 2022-08-18: qty 20

## 2022-08-18 MED ORDER — SODIUM CHLORIDE 0.9 % IV SOLN: Freq: Once | INTRAVENOUS | Status: AC

## 2022-08-18 MED ORDER — SODIUM CHLORIDE 0.9% FLUSH
10.0000 mL | INTRAVENOUS | Status: DC | PRN
  Administered 2022-08-18: 10 mL

## 2022-08-18 MED ORDER — HEPARIN SOD (PORK) LOCK FLUSH 100 UNIT/ML IV SOLN
500.0000 [IU] | Freq: Once | INTRAVENOUS | Status: AC | PRN
  Administered 2022-08-18: 500 [IU]

## 2022-08-18 NOTE — Progress Notes (Signed)
Patient has been examined by Dr. Katragadda, and vital signs and labs have been reviewed. ANC, Creatinine, LFTs, hemoglobin, and platelets are within treatment parameters per M.D. - pt may proceed with treatment.  Primary RN and pharmacy notified.  

## 2022-08-18 NOTE — Progress Notes (Signed)
Labs reviewed today with MD. Madaline Brilliant to treat per MD.   Treatment given per orders. Patient tolerated it well without problems. Vitals stable and discharged home from clinic ambulatory. Follow up as scheduled.

## 2022-08-18 NOTE — Patient Instructions (Signed)
[place] at [facility] Discharge Instructions   You were seen and examined today by Dr. Delton Coombes.  He reviewed the results of your CT scan which is stable.   He reviewed the results of your lab work which are normal/stable.   We will proceed with your treatment today.   Return as scheduled.    Thank you for choosing [facility] at [facility] to provide your oncology and hematology care.  To afford each patient quality time with our provider, please arrive at least 15 minutes before your scheduled appointment time.   If you have a lab appointment with the [facility] please come in thru the Main Entrance and check in at the main information desk.  You need to re-schedule your appointment should you arrive 10 or more minutes late.  We strive to give you quality time with our providers, and arriving late affects you and other patients whose appointments are after yours.  Also, if you no show three or more times for appointments you may be dismissed from the clinic at the providers discretion.     Again, thank you for choosing [facility].  Our hope is that these requests will decrease the amount of time that you wait before being seen by our physicians.       _____________________________________________________________  Should you have questions after your visit to [facility], please contact our office at [phone] and follow the prompts.  Our office hours are 8:00 a.m. and 4:30 p.m. Monday - Friday.  Please note that voicemails left after 4:00 p.m. may not be returned until the following business day.  We are closed weekends and major holidays.  You do have access to a nurse 24-7, just call the main number to the clinic [phone] and do not press any options, hold on the line and a nurse will answer the phone.    For prescription refill requests, have your pharmacy contact our office and allow 72 hours.     Due to Covid, you will need to wear a mask upon entering the hospital. If you do not have a mask, a mask will be given to you at the Main Entrance upon arrival. For doctor visits, patients may have 1 support person age 8 or older with them. For treatment visits, patients can not have anyone with them due to social distancing guidelines and our immunocompromised population.

## 2022-08-18 NOTE — Progress Notes (Signed)
[address] [place], [place] [address]   CLINIC:  Medical Oncology/Hematology  PCP:  [name], [place] / [place] [address] [phone]   REASON FOR VISIT:  Follow-up for right lung mass and mediastinal lymphadenopathy  PRIOR THERAPY: 1.  4 cycles of carboplatin, etoposide and Atezolizumab completed on 04/29/2022  NGS Results: not done  CURRENT THERAPY: Maintenance Atezolizumab every 3 weeks  BRIEF ONCOLOGIC HISTORY:  Oncology History  Small cell lung cancer, right upper lobe ([place])  01/22/2022 Initial Diagnosis   Small cell lung cancer, right upper lobe ([place])   01/22/2022 Cancer Staging   Staging form: Lung, AJCC 8th Edition - Clinical stage from 01/22/2022: Stage IVB (cT4, cN2, pM1c) - Signed by [name], MD on 02/16/2022 Histopathologic type: Small cell carcinoma, NOS Stage prefix: Initial diagnosis   02/23/2022 - 05/25/2022 Chemotherapy   Patient is on Treatment Plan : LUNG SCLC Carboplatin + Etoposide + Atezolizumab Induction q21d / Atezolizumab Maintenance q21d     02/23/2022 -  Chemotherapy   Patient is on Treatment Plan : LUNG SCLC Carboplatin + Etoposide + Atezolizumab Induction q21d x 4 cycles / Atezolizumab Maintenance q21d       CANCER STAGING:  Cancer Staging  Small cell lung cancer, right upper lobe ([place]) Staging form: Lung, AJCC 8th Edition - Clinical stage from 01/22/2022: Stage IVB (cT4, cN2, pM1c) - Signed by [name], MD on 02/16/2022   INTERVAL HISTORY:  Mr. BRYN PERKIN, a 71 y.o. male, seen for follow-up of small cell lung cancer.  He is tolerating Atezolizumab very well.  Chronic cough and shortness of breath has been stable.  Energy levels are 50%.  He is not requiring any medication for sleep.   REVIEW OF SYSTEMS:  Review of Systems  Constitutional:  Negative for appetite change and fatigue.  HENT:   Negative for trouble swallowing.   Respiratory:  Positive for cough  and shortness of breath.   Cardiovascular:  Negative for palpitations.  Gastrointestinal:  Negative for diarrhea.  All other systems reviewed and are negative.   PAST MEDICAL/SURGICAL HISTORY:  Past Medical History:  Diagnosis Date   Dyspnea    Dysrhythmia    hx a-fib   Hypertension    Insomnia    Pre-diabetes    Sleep apnea    Past Surgical History:  Procedure Laterality Date   ATRIAL FIBRILLATION ABLATION     approx 2020   CARDIAC CATHETERIZATION     HERNIA REPAIR     IR IMAGING GUIDED PORT INSERTION  02/17/2022   IR VENO/JUGULAR RIGHT  02/17/2022   VIDEO BRONCHOSCOPY WITH ENDOBRONCHIAL ULTRASOUND Bilateral 01/29/2022   Procedure: VIDEO BRONCHOSCOPY WITH ENDOBRONCHIAL ULTRASOUND;  Surgeon: Garner Nash, DO;  Location: [facility];  Service: Cardiopulmonary;  Laterality: Bilateral;  w/ Guardant [id number]    SOCIAL HISTORY:  Social History   Socioeconomic History   Marital status: Married    Spouse name: Not on file   Number of children: Not on file   Years of education: Not on file   Highest education level: Not on file  Occupational History   Occupation: Retired  Tobacco Use   Smoking status: Every Day    Types: Cigarettes    Passive exposure: Current   Smokeless tobacco: Not on file   Tobacco comments:    Pt smokes 1 ppd. 01/26/22  Vaping Use   Vaping Use: Never used  Substance and Sexual Activity   Alcohol use: Not  Currently    Comment: 1 bottle wine a day    Drug use: No   Sexual activity: Not on file  Other Topics Concern   Not on file  Social History Narrative   Not on file   Social Determinants of Health   Financial Resource Strain: Not on file  Food Insecurity: Not on file  Transportation Needs: Not on file  Physical Activity: Not on file  Stress: Not on file  Social Connections: Not on file  Intimate Partner Violence: Not on file    FAMILY HISTORY:  Family History  Problem Relation Age of Onset   Coronary artery disease Father    Diabetes  Father     CURRENT MEDICATIONS:  Current Outpatient Medications  Medication Sig Dispense Refill   allopurinol (ZYLOPRIM) 300 MG tablet Take 600 mg by mouth daily.     ALPRAZolam (XANAX) 1 MG tablet Take 1 tablet (1 mg total) by mouth at bedtime as needed for anxiety. 30 tablet 3   Atezolizumab (TECENTRIQ IV) Inject into the vein every 21 ( twenty-one) days.     baclofen (LIORESAL) 10 MG tablet Take 1-2 tablets (10-20 mg total) by mouth 3 (three) times daily. 180 each 0   furosemide (LASIX) 80 MG tablet Take 80 mg by mouth daily.     lidocaine (XYLOCAINE) 2 % solution Use as directed 15 mLs in the mouth or throat as needed for mouth pain (prior to meals/supplements to decrease pain with swallowing). Mix 1:1 with Maalox 100 mL 6   lidocaine-prilocaine (EMLA) cream Apply topically as directed.     metolazone (ZAROXOLYN) 2.5 MG tablet Take 2.5 mg by mouth daily.     nystatin (MYCOSTATIN) 100000 UNIT/ML suspension Take 5 mLs (500,000 Units total) by mouth 4 (four) times daily. 120 mL 3   potassium chloride (KLOR-CON M) 10 MEQ tablet Take 1 tablet (10 mEq total) by mouth 2 (two) times daily. 180 tablet 3   sotalol (BETAPACE) 80 MG tablet SMARTSIG:1 Tablet(s) By Mouth Every 12 Hours     sucralfate (CARAFATE) 1 GM/10ML suspension TAKE 10MLS BY MOUTH 4 TIMES DAILY AS NEEDED. 480 mL 6   traZODone (DESYREL) 100 MG tablet Take 1 tablet (100 mg total) by mouth at bedtime. 30 tablet 0   Vitamin D, Ergocalciferol, (DRISDOL) 1.25 MG (50000 UNIT) CAPS capsule Take 50,000 Units by mouth daily.     zolpidem (AMBIEN) 10 MG tablet Take 1 tablet (10 mg total) by mouth at bedtime as needed for sleep. 30 tablet 0   No current facility-administered medications for this visit.   Facility-Administered Medications Ordered in Other Visits  Medication Dose Route Frequency Provider Last Rate Last Admin   atezolizumab (TECENTRIQ) 1,200 mg in sodium chloride 0.9 % 250 mL chemo infusion  1,200 mg Intravenous Once  [name], MD 540 mL/hr at 08/18/22 1440 1,200 mg at 08/18/22 1440   heparin lock flush 100 unit/mL  500 Units Intracatheter Once PRN [name], MD       sodium chloride flush (NS) 0.9 % injection 10 mL  10 mL Intracatheter PRN [name], MD        ALLERGIES:  No Known Allergies  PHYSICAL EXAM:  Performance status (ECOG): 1 - Symptomatic but completely ambulatory  There were no vitals filed for this visit.  Wt Readings from Last 3 Encounters:  08/18/22 238 lb (108 kg)  07/27/22 241 lb 3.2 oz (109.4 kg)  07/06/22 241 lb 3.2 oz (109.4 kg)   Physical Exam Vitals  reviewed.  Constitutional:      Appearance: Normal appearance.  Cardiovascular:     Rate and Rhythm: Normal rate and regular rhythm.     Pulses: Normal pulses.     Heart sounds: Normal heart sounds.  Pulmonary:     Effort: Pulmonary effort is normal.     Breath sounds: Normal breath sounds.  Neurological:     General: No focal deficit present.     Mental Status: He is alert and oriented to person, place, and time.  Psychiatric:        Mood and Affect: Mood normal.        Behavior: Behavior normal.     LABORATORY DATA:  I have reviewed the labs as listed.     Latest Ref Rng & Units 08/18/2022   12:13 PM 07/27/2022    9:42 AM 07/06/2022   11:44 AM  CBC  WBC 4.0 - 10.5 K/uL 5.6  5.8  6.5   Hemoglobin 13.0 - 17.0 g/dL 15.0  14.7  14.2   Hematocrit 39.0 - 52.0 % 44.7  44.1  43.4   Platelets 150 - 400 K/uL 167  184  177       Latest Ref Rng & Units 08/18/2022   12:13 PM 07/27/2022    9:42 AM 07/06/2022   11:44 AM  CMP  Glucose 70 - 99 mg/dL 94  101  92   BUN 8 - 23 mg/dL 10  13  11    Creatinine 0.61 - 1.24 mg/dL 0.66  0.71  0.72   Sodium 135 - 145 mmol/L 138  139  135   Potassium 3.5 - 5.1 mmol/L 3.7  3.6  3.8   Chloride 98 - 111 mmol/L 104  99  100   CO2 22 - 32 mmol/L 29  31  30    Calcium 8.9 - 10.3 mg/dL 8.3  8.4  8.2   Total Protein 6.5 - 8.1 g/dL 6.9  6.8  6.7    Total Bilirubin 0.3 - 1.2 mg/dL 1.0  0.8  0.6   Alkaline Phos 38 - 126 U/L 72  71  74   AST 15 - 41 U/L 12  15  16    ALT 0 - 44 U/L 10  11  12      DIAGNOSTIC IMAGING:  I have independently reviewed the scans and discussed with the patient. CT CHEST ABDOMEN PELVIS W CONTRAST  Result Date: 08/12/2022 CLINICAL DATA:  Small cell lung cancer; * Tracking Code: BO * EXAM: CT CHEST, ABDOMEN, AND PELVIS WITH CONTRAST TECHNIQUE: Multidetector CT imaging of the chest, abdomen and pelvis was performed following the standard protocol during bolus administration of intravenous contrast. RADIATION DOSE REDUCTION: This exam was performed according to the departmental dose-optimization program which includes automated exposure control, adjustment of the mA and/or kV according to patient size and/or use of iterative reconstruction technique. CONTRAST:  171mL OMNIPAQUE IOHEXOL 300 MG/ML  SOLN COMPARISON:  CT chest, abdomen and pelvis dated April 28, 2022 FINDINGS: CT CHEST FINDINGS Cardiovascular: Normal heart size. Small pericardial effusion, slightly increased in size when compared prior exam. Normal caliber thoracic aorta with moderate atherosclerotic disease. Severe left main and three-vessel coronary artery calcifications. Mediastinum/Nodes: Esophagus and thyroid are unremarkable. Mildly enlarged right hilar lymph node measuring 1.7 cm in short axis on series 2, image 28, unchanged when compared with prior exam. Stable right subcarinal lymph node measuring 9 mm in short axis on series 2, image 30. Unchanged right perihilar soft tissue thickening. Lungs/Pleura: Central  airways are patent. New peripheral linear opacities of the posterior right upper lobe on series 4 image 68, likely posttreatment change. No pleural effusion or pneumothorax. 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 No  suspicious bone lesions identified. CT ABDOMEN PELVIS FINDINGS Hepatobiliary: Scattered low-attenuation liver lesions are unchanged when compared with prior exam, too small to completely characterize but likely simple cysts. No suspicious liver lesions. Gallbladder is unremarkable. No biliary ductal dilation. Pancreas: Unremarkable. No pancreatic ductal dilatation or surrounding inflammatory changes. Spleen: Normal in size without focal abnormality. Adrenals/Urinary Tract: Unchanged right-greater-than-left adrenal gland thickening. Maybe a little bit more. Kidneys are normal, without renal calculi, focal lesion, or hydronephrosis. Bladder is unremarkable. Stomach/Bowel: Stomach is within normal limits. Appendix appears normal. Diverticulosis. No evidence of bowel wall thickening, distention, or inflammatory changes. Vascular/Lymphatic: Aortic atherosclerosis. No enlarged abdominal or pelvic lymph nodes. Reproductive: Prostate is unremarkable. Other: Small bilateral fat containing inguinal hernias. Musculoskeletal: No acute or significant osseous findings. IMPRESSION: 1. Unchanged right perihilar soft tissue thickening and mildly enlarged right hilar lymph node. No evidence of new or progressive metastatic disease in the chest, abdomen or pelvis. 2. New peripheral opacities of the superior portion of the right lower lobe with a somewhat linear configuration, likely evolving posttreatment change. Recommend attention on follow-up. 3. Small pericardial effusion, slightly increased in size when compared with prior exam. 4. 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 5. Aortic Atherosclerosis (ICD10-I70.0) and Emphysema (ICD10-J43.9). Electronically Signed   By: Yetta Glassman M.D.   On: 08/12/2022 10:33   MR Brain W Wo Contrast  Result Date: 08/12/2022 CLINICAL DATA:  Small-cell lung cancer, right upper lobe. Metastatic  disease evaluation. Extensive stage small cell lung cancer. EXAM: MRI HEAD WITHOUT AND WITH CONTRAST TECHNIQUE: Multiplanar, multiecho pulse sequences of the brain and surrounding structures were obtained without and with intravenous contrast. CONTRAST:  86mL GADAVIST GADOBUTROL 1 MMOL/ML IV SOLN COMPARISON:  MRI of the brain January 24, 2022. FINDINGS: Brain: No acute infarction, hemorrhage, hydrocephalus, extra-axial collection or mass lesion. 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 Minimal patchy T2 hyperintensity of the periventricular white matter, nonspecific, most likely related to early chronic microangiopathy. Mild parenchymal volume loss. No focus of abnormal contrast enhancement identified. Vascular: Normal flow voids. Skull and upper cervical spine: Normal marrow signal. Sinuses/Orbits: Scattered mucosal thickening throughout the paranasal sinuses. Bilateral lens surgery. Other: None. IMPRESSION: 1. No evidence of intracranial metastatic disease. 2. 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 Electronically Signed   By: Pedro Earls M.D.   On: 08/12/2022 09:16     ASSESSMENT:  Extensive stage small cell lung cancer: - Productive cough for 3 weeks with hemoptysis.  Hemoptysis improved after Eliquis stopped 2 weeks ago.  Still has some purulent sputum.  Currently on Levaquin. - Abnormal chest x-ray on 01/19/2022 for productive cough - CT chest with contrast (01/21/2022): Right hilar mass measuring 6.5 x 5.2 cm surrounding and compressing the right upper lobe bronchus, bronchus intermedius, right middle lobe bronchus and right lower lobe bronchus centrally.  Compression of the right lower lobe pulmonary artery.  Nodular soft tissue lesion in the right lower lobe continuous with right hilar mass and could represent endobronchial spread.  Enlarged mediastinal lymph nodes but no findings  of pulmonary metastatic  disease.  Indeterminate bilateral adrenal gland nodules and low attenuation liver lesions, likely benign cyst. - No weight loss.  No chest pains. - PET scan: Right hilar mass involving upper lobe, middle lobe and lower lobe with satellite nodularity in the right lower lobe, hypermetabolic subcarinal and right paratracheal lymphadenopathy.  Pleural-based nodule along the right posterior costophrenic angle.  Hypermetabolic left adrenal lesion.  Small hypermetabolic lesions in the humerus bilaterally and a subcutaneous focus in the right posterior lower back. - Right lung hilar mass biopsy (01/29/2022): Small cell carcinoma -4 cycles of carboplatin, etoposide and atezolizumab from 02/23/2022 through 04/29/2022, maintenance Atezolizumab ongoing    Social/family history: - He lives at home with his wife Jana Half.  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  Retired 8 years ago.  Smoked 1 pack/day for 50 years. - Father had renal cell carcinoma.   PLAN:  Extensive stage small cell lung cancer: -CT CAP (08/10/2022): Unchanged right perihilar soft tissue thickening and mildly enlarged right hilar lymph node.  No new evidence of progression.  Treatment changes in the right lower lobe.  Small pericardial effusion, slightly increased in size when compared to prior scan. - Labs from today shows normal LFTs and CBC.  Creatinine and electrolytes are normal. - Proceed with Atezolizumab today and in 3 weeks.  RTC 6 weeks for follow-up with repeat labs.  2.  Difficulty sleeping: - Ambien and Xanax did not help.  He is no longer requiring trazodone and is able to sleep well.  3.  Hypokalemia: - Continue potassium supplements daily.  Potassium is normal.   Orders placed this encounter:  No orders of the defined types were placed in this encounter.    [name], MD [place] [phone]

## 2022-09-08 ENCOUNTER — Ambulatory Visit: Payer: PPO | Admitting: Hematology

## 2022-09-08 ENCOUNTER — Inpatient Hospital Stay: Payer: PPO | Attending: Hematology

## 2022-09-08 ENCOUNTER — Inpatient Hospital Stay: Payer: PPO

## 2022-09-08 VITALS — BP 111/60 | HR 74 | Temp 98.1°F | Resp 18

## 2022-09-08 DIAGNOSIS — C3411 Malignant neoplasm of upper lobe, right bronchus or lung: Secondary | ICD-10-CM | POA: Diagnosis not present

## 2022-09-08 DIAGNOSIS — F17210 Nicotine dependence, cigarettes, uncomplicated: Secondary | ICD-10-CM | POA: Diagnosis not present

## 2022-09-08 DIAGNOSIS — Z5112 Encounter for antineoplastic immunotherapy: Secondary | ICD-10-CM | POA: Diagnosis not present

## 2022-09-08 DIAGNOSIS — Z79899 Other long term (current) drug therapy: Secondary | ICD-10-CM | POA: Diagnosis not present

## 2022-09-08 LAB — COMPREHENSIVE METABOLIC PANEL
ALT: 10 U/L (ref 0–44)
AST: 11 U/L — ABNORMAL LOW (ref 15–41)
Albumin: 3.9 g/dL (ref 3.5–5.0)
Alkaline Phosphatase: 76 U/L (ref 38–126)
Anion gap: 11 (ref 5–15)
BUN: 14 mg/dL (ref 8–23)
CO2: 29 mmol/L (ref 22–32)
Calcium: 8.5 mg/dL — ABNORMAL LOW (ref 8.9–10.3)
Chloride: 97 mmol/L — ABNORMAL LOW (ref 98–111)
Creatinine, Ser: 0.81 mg/dL (ref 0.61–1.24)
GFR, Estimated: >60 mL/min (ref >60)
Glucose, Bld: 99 mg/dL (ref 70–99)
Potassium: 2.7 mmol/L — CL (ref 3.5–5.1)
Sodium: 137 mmol/L (ref 135–145)
Total Bilirubin: 1.1 mg/dL (ref 0.3–1.2)
Total Protein: 7.1 g/dL (ref 6.5–8.1)

## 2022-09-08 LAB — MAGNESIUM: Magnesium: 1.7 mg/dL (ref 1.7–2.4)

## 2022-09-08 LAB — CBC WITH DIFFERENTIAL/PLATELET
Abs Immature Granulocytes: 0.04 10*3/uL (ref 0.00–0.07)
Basophils Absolute: 0.1 10*3/uL (ref 0.0–0.1)
Basophils Relative: 1 %
Eosinophils Absolute: 0.3 10*3/uL (ref 0.0–0.5)
Eosinophils Relative: 4 %
HCT: 46.4 % (ref 39.0–52.0)
Hemoglobin: 15.3 g/dL (ref 13.0–17.0)
Immature Granulocytes: 1 %
Lymphocytes Relative: 12 %
Lymphs Abs: 1 10*3/uL (ref 0.7–4.0)
MCH: 30.1 pg (ref 26.0–34.0)
MCHC: 33 g/dL (ref 30.0–36.0)
MCV: 91.3 fL (ref 80.0–100.0)
Monocytes Absolute: 0.7 10*3/uL (ref 0.1–1.0)
Monocytes Relative: 8 %
Neutro Abs: 6.1 10*3/uL (ref 1.7–7.7)
Neutrophils Relative %: 74 %
Platelets: 224 10*3/uL (ref 150–400)
RBC: 5.08 MIL/uL (ref 4.22–5.81)
RDW: 14.8 % (ref 11.5–15.5)
WBC: 8.1 10*3/uL (ref 4.0–10.5)
nRBC: 0 % (ref 0.0–0.2)

## 2022-09-08 LAB — TSH: TSH: 0.758 u[IU]/mL (ref 0.350–4.500)

## 2022-09-08 MED ORDER — HEPARIN SOD (PORK) LOCK FLUSH 100 UNIT/ML IV SOLN
500.0000 [IU] | Freq: Once | INTRAVENOUS | Status: AC | PRN
  Administered 2022-09-08: 500 [IU]

## 2022-09-08 MED ORDER — POTASSIUM CHLORIDE CRYS ER 20 MEQ PO TBCR
40.0000 meq | EXTENDED_RELEASE_TABLET | ORAL | Status: AC
  Administered 2022-09-08 (×2): 40 meq via ORAL
  Filled 2022-09-08 (×2): qty 2

## 2022-09-08 MED ORDER — SODIUM CHLORIDE 0.9 % IV SOLN
1200.0000 mg | Freq: Once | INTRAVENOUS | Status: AC
  Administered 2022-09-08: 1200 mg via INTRAVENOUS
  Filled 2022-09-08: qty 20

## 2022-09-08 MED ORDER — SODIUM CHLORIDE 0.9 % IV SOLN: Freq: Once | INTRAVENOUS | Status: AC

## 2022-09-08 MED ORDER — SODIUM CHLORIDE 0.9% FLUSH
10.0000 mL | INTRAVENOUS | Status: DC | PRN
  Administered 2022-09-08: 10 mL

## 2022-09-08 MED ORDER — SODIUM CHLORIDE 0.9% FLUSH
10.0000 mL | INTRAVENOUS | Status: AC
  Administered 2022-09-08: 10 mL

## 2022-09-08 NOTE — Progress Notes (Signed)
CRITICAL VALUE ALERT Critical value received:  potassium 2.7 Date of notification:  07-09-22 Time of notification: 2458 Critical value read back:  Yes.   Nurse who received alert:  C. Deaun Rocha RN MD notified time and response:  0998, give potassium per orders

## 2022-09-08 NOTE — Patient Instructions (Signed)
[facility]  Discharge Instructions: Thank you for choosing [facility] to provide your oncology and hematology care.  If you have a lab appointment with the [facility], please come in thru the Main Entrance and check in at the main information desk.  Wear comfortable clothing and clothing appropriate for easy access to any Portacath or PICC line.   We strive to give you quality time with your provider. You may need to reschedule your appointment if you arrive late (15 or more minutes).  Arriving late affects you and other patients whose appointments are after yours.  Also, if you miss three or more appointments without notifying the office, you may be dismissed from the clinic at the provider's discretion.      For prescription refill requests, have your pharmacy contact our office and allow 72 hours for refills to be completed.    Today you received the following chemotherapy and/or immunotherapy agents Tecentriq.  Atezolizumab Injection What is this medication? ATEZOLIZUMAB (a te zoe LIZ ue mab) treats some types of cancer. It works by helping your immune system slow or stop the spread of cancer cells. It is a monoclonal antibody. This medicine may be used for other purposes; ask your health care provider or pharmacist if you have questions. COMMON BRAND NAME(S): Tecentriq What should I tell my care team before I take this medication? They need to know if you have any of these conditions: Allogeneic stem cell transplant (uses someone else's stem cells) Autoimmune diseases, such as Crohn disease, ulcerative colitis, lupus History of chest radiation Nervous system problems, such as Guillain-Barre syndrome, myasthenia gravis Organ transplant An unusual or allergic reaction to atezolizumab, other medications, foods, dyes, or preservatives Pregnant or trying to get pregnant Breast-feeding How should I use this medication? This medication is injected  into a vein. It is given by your care team in a hospital or clinic setting. A special MedGuide will be given to you before each treatment. Be sure to read this information carefully each time. Talk to your care team about the use of this medication in children. While it may be prescribed for children as young as 2 years for selected conditions, precautions do apply. Overdosage: If you think you have taken too much of this medicine contact a poison control center or emergency room at once. NOTE: This medicine is only for you. Do not share this medicine with others. What if I miss a dose? Keep appointments for follow-up doses. It is important not to miss your dose. Call your care team if you are unable to keep an appointment. What may interact with this medication? Interactions have not been studied. This list may not describe all possible interactions. Give your health care provider a list of all the medicines, herbs, non-prescription drugs, or dietary supplements you use. Also tell them if you smoke, drink alcohol, or use illegal drugs. Some items may interact with your medicine. What should I watch for while using this medication? Your condition will be monitored carefully while you are receiving this medication. You may need blood work while taking this medication. This medication may cause serious skin reactions. They can happen weeks to months after starting the medication. Contact your care team right away if you notice fevers or flu-like symptoms with a rash. The rash may be red or purple and then turn into blisters or peeling of the skin. You may also notice a red rash with swelling of the face, lips, or  lymph nodes in your neck or under your arms. Tell your care team right away if you have any change in your eyesight. Talk to your care team if you may be pregnant. Serious birth defects can occur if you take this medication during pregnancy and for 5 months after the last dose. You will need a  negative pregnancy test before starting this medication. Contraception is recommended while taking this medication and for 5 months after the last dose. Your care team can help you find the option that works for you. Do not breastfeed while taking this medication and for at least 5 months after the last dose. What side effects may I notice from receiving this medication? Side effects that you should report to your doctor or health care professional as soon as possible: Allergic reactions--skin rash, itching, hives, swelling of the face, lips, tongue, or throat Dry cough, shortness of breath or trouble breathing Eye pain, redness, irritation, or discharge with blurry or decreased vision Heart muscle inflammation--unusual weakness or fatigue, shortness of breath, chest pain, fast or irregular heartbeat, dizziness, swelling of the ankles, feet, or hands Hormone gland problems--headache, sensitivity to light, unusual weakness or fatigue, dizziness, fast or irregular heartbeat, increased sensitivity to cold or heat, excessive sweating, constipation, hair loss, increased thirst or amount of urine, tremors or shaking, irritability Infusion reactions--chest pain, shortness of breath or trouble breathing, feeling faint or lightheaded Kidney injury (glomerulonephritis)--decrease in the amount of urine, red or dark Jantzen Pilger urine, foamy or bubbly urine, swelling of the ankles, hands, or feet Liver injury--right upper belly pain, loss of appetite, nausea, light-colored stool, dark yellow or Adrina Armijo urine, yellowing skin or eyes, unusual weakness or fatigue Pain, tingling, or numbness in the hands or feet, muscle weakness, change in vision, confusion or trouble speaking, loss of balance or coordination, trouble walking, seizures Rash, fever, and swollen lymph nodes Redness, blistering, peeling, or loosening of the skin, including inside the mouth Sudden or severe stomach pain, bloody diarrhea, fever, nausea,  vomiting Side effects that usually do not require medical attention (report to your doctor or health care professional if they continue or are bothersome): Bone, joint, or muscle pain Diarrhea Fatigue Loss of appetite Nausea Skin rash This list may not describe all possible side effects. Call your doctor for medical advice about side effects. You may report side effects to FDA at 1-800-FDA-1088. Where should I keep my medication? This medication is given in a hospital or clinic. It will not be stored at home. NOTE: This sheet is a summary. It may not cover all possible information. If you have questions about this medicine, talk to your doctor, pharmacist, or health care provider.  2023 Elsevier/Gold Standard (2022-02-03 00:00:00)        To help prevent nausea and vomiting after your treatment, we encourage you to take your nausea medication as directed.  BELOW ARE SYMPTOMS THAT SHOULD BE REPORTED IMMEDIATELY: *FEVER GREATER THAN 100.4 F (38 C) OR HIGHER *CHILLS OR SWEATING *NAUSEA AND VOMITING THAT IS NOT CONTROLLED WITH YOUR NAUSEA MEDICATION *UNUSUAL SHORTNESS OF BREATH *UNUSUAL BRUISING OR BLEEDING *URINARY PROBLEMS (pain or burning when urinating, or frequent urination) *BOWEL PROBLEMS (unusual diarrhea, constipation, pain near the anus) TENDERNESS IN MOUTH AND THROAT WITH OR WITHOUT PRESENCE OF ULCERS (sore throat, sores in mouth, or a toothache) UNUSUAL RASH, SWELLING OR PAIN  UNUSUAL VAGINAL DISCHARGE OR ITCHING   Items with * indicate a potential emergency and should be followed up as soon as possible or go to  the Emergency Department if any problems should occur.  Please show the CHEMOTHERAPY ALERT CARD or IMMUNOTHERAPY ALERT CARD at check-in to the Emergency Department and triage nurse.  Should you have questions after your visit or need to cancel or reschedule your appointment, please contact [facility] [phone]  and follow the prompts.   Office hours are 8:00 a.m. to 4:30 p.m. Monday - Friday. Please note that voicemails left after 4:00 p.m. may not be returned until the following business day.  We are closed weekends and major holidays. You have access to a nurse at all times for urgent questions. Please call the main number to the clinic [phone] and follow the prompts.  For any non-urgent questions, you may also contact your provider using MyChart. We now offer e-Visits for anyone 52 and older to request care online for non-urgent symptoms. For details visit [email or web address].   Also download the MyChart app! Go to the app store, search "MyChart", open the app, select [facility], and log in with your MyChart username and password.  Masks are optional in the cancer centers. If you would like for your care team to wear a mask while they are taking care of you, please let them know. You may have one support person who is at least 71 years old accompany you for your appointments.

## 2022-09-08 NOTE — Progress Notes (Signed)
Patient presents today for chemotherapy infusion.  Patient is in satisfactory condition with no complaints voiced.  Vital signs are stable.  Labs reviewed.  All labs are within treatment parameters.  Potassium today is 2.7.  Patient is refusing IV potassium and states he will only take PO potassium.    We will give Klor Con 40 mEq pre and post treatment today per Dr. Delton Coombes.    Patient tolerated treatment well with no complaints voiced.  Patient left ambulatory in stable condition.  Vital signs stable at discharge.  Follow up as scheduled.

## 2022-09-10 DIAGNOSIS — B351 Tinea unguium: Secondary | ICD-10-CM | POA: Diagnosis not present

## 2022-09-10 DIAGNOSIS — M79674 Pain in right toe(s): Secondary | ICD-10-CM | POA: Diagnosis not present

## 2022-09-10 LAB — T4: T4, Total: 9.5 ug/dL (ref 4.5–12.0)

## 2022-09-29 ENCOUNTER — Inpatient Hospital Stay: Payer: PPO

## 2022-09-29 ENCOUNTER — Inpatient Hospital Stay (HOSPITAL_BASED_OUTPATIENT_CLINIC_OR_DEPARTMENT_OTHER): Payer: PPO | Admitting: Hematology

## 2022-09-29 VITALS — BP 121/76 | HR 70 | Temp 98.8°F | Resp 20 | Ht 68.0 in | Wt 243.2 lb

## 2022-09-29 VITALS — BP 120/76 | HR 76 | Temp 98.8°F | Resp 18

## 2022-09-29 DIAGNOSIS — Z95828 Presence of other vascular implants and grafts: Secondary | ICD-10-CM

## 2022-09-29 DIAGNOSIS — C3411 Malignant neoplasm of upper lobe, right bronchus or lung: Secondary | ICD-10-CM

## 2022-09-29 DIAGNOSIS — Z5112 Encounter for antineoplastic immunotherapy: Secondary | ICD-10-CM | POA: Diagnosis not present

## 2022-09-29 LAB — CBC WITH DIFFERENTIAL/PLATELET
Abs Immature Granulocytes: 0.03 10*3/uL (ref 0.00–0.07)
Basophils Absolute: 0 10*3/uL (ref 0.0–0.1)
Basophils Relative: 0 %
Eosinophils Absolute: 0.2 10*3/uL (ref 0.0–0.5)
Eosinophils Relative: 3 %
HCT: 45.1 % (ref 39.0–52.0)
Hemoglobin: 14.8 g/dL (ref 13.0–17.0)
Immature Granulocytes: 0 %
Lymphocytes Relative: 10 %
Lymphs Abs: 0.8 10*3/uL (ref 0.7–4.0)
MCH: 30.4 pg (ref 26.0–34.0)
MCHC: 32.8 g/dL (ref 30.0–36.0)
MCV: 92.6 fL (ref 80.0–100.0)
Monocytes Absolute: 0.5 10*3/uL (ref 0.1–1.0)
Monocytes Relative: 7 %
Neutro Abs: 5.8 10*3/uL (ref 1.7–7.7)
Neutrophils Relative %: 80 %
Platelets: 173 10*3/uL (ref 150–400)
RBC: 4.87 MIL/uL (ref 4.22–5.81)
RDW: 15 % (ref 11.5–15.5)
WBC: 7.4 10*3/uL (ref 4.0–10.5)
nRBC: 0 % (ref 0.0–0.2)

## 2022-09-29 LAB — COMPREHENSIVE METABOLIC PANEL
ALT: 10 U/L (ref 0–44)
AST: 13 U/L — ABNORMAL LOW (ref 15–41)
Albumin: 3.6 g/dL (ref 3.5–5.0)
Alkaline Phosphatase: 69 U/L (ref 38–126)
Anion gap: 6 (ref 5–15)
BUN: 10 mg/dL (ref 8–23)
CO2: 29 mmol/L (ref 22–32)
Calcium: 8 mg/dL — ABNORMAL LOW (ref 8.9–10.3)
Chloride: 102 mmol/L (ref 98–111)
Creatinine, Ser: 0.73 mg/dL (ref 0.61–1.24)
GFR, Estimated: >60 mL/min (ref >60)
Glucose, Bld: 108 mg/dL — ABNORMAL HIGH (ref 70–99)
Potassium: 3.8 mmol/L (ref 3.5–5.1)
Sodium: 137 mmol/L (ref 135–145)
Total Bilirubin: 1.3 mg/dL — ABNORMAL HIGH (ref 0.3–1.2)
Total Protein: 6.7 g/dL (ref 6.5–8.1)

## 2022-09-29 LAB — TSH: TSH: 0.638 u[IU]/mL (ref 0.350–4.500)

## 2022-09-29 LAB — MAGNESIUM: Magnesium: 1.8 mg/dL (ref 1.7–2.4)

## 2022-09-29 MED ORDER — SODIUM CHLORIDE 0.9% FLUSH
10.0000 mL | Freq: Once | INTRAVENOUS | Status: AC
  Administered 2022-09-29: 10 mL via INTRAVENOUS

## 2022-09-29 MED ORDER — SODIUM CHLORIDE 0.9 % IV SOLN
1200.0000 mg | Freq: Once | INTRAVENOUS | Status: AC
  Administered 2022-09-29: 1200 mg via INTRAVENOUS
  Filled 2022-09-29: qty 20

## 2022-09-29 MED ORDER — HEPARIN SOD (PORK) LOCK FLUSH 100 UNIT/ML IV SOLN
500.0000 [IU] | Freq: Once | INTRAVENOUS | Status: AC | PRN
  Administered 2022-09-29: 500 [IU]

## 2022-09-29 MED ORDER — ALTEPLASE 2 MG IJ SOLR
2.0000 mg | Freq: Once | INTRAMUSCULAR | Status: AC | PRN
  Administered 2022-09-29: 2 mg
  Filled 2022-09-29: qty 2

## 2022-09-29 MED ORDER — SODIUM CHLORIDE 0.9 % IV SOLN: Freq: Once | INTRAVENOUS | Status: AC

## 2022-09-29 MED ORDER — SODIUM CHLORIDE 0.9% FLUSH
10.0000 mL | INTRAVENOUS | Status: DC | PRN
  Administered 2022-09-29: 10 mL

## 2022-09-29 NOTE — Patient Instructions (Signed)
[facility]  Discharge Instructions: Thank you for choosing [facility] to provide your oncology and hematology care.  If you have a lab appointment with the [facility], please come in thru the Main Entrance and check in at the main information desk.  Wear comfortable clothing and clothing appropriate for easy access to any Portacath or PICC line.   We strive to give you quality time with your provider. You may need to reschedule your appointment if you arrive late (15 or more minutes).  Arriving late affects you and other patients whose appointments are after yours.  Also, if you miss three or more appointments without notifying the office, you may be dismissed from the clinic at the provider's discretion.      For prescription refill requests, have your pharmacy contact our office and allow 72 hours for refills to be completed.    Today you received the following chemotherapy and/or immunotherapy agents Tecentriq.   Atezolizumab Injection What is this medication? ATEZOLIZUMAB (a te zoe LIZ ue mab) treats some types of cancer. It works by helping your immune system slow or stop the spread of cancer cells. It is a monoclonal antibody. This medicine may be used for other purposes; ask your health care provider or pharmacist if you have questions. COMMON BRAND NAME(S): Tecentriq What should I tell my care team before I take this medication? They need to know if you have any of these conditions: Allogeneic stem cell transplant (uses someone else's stem cells) Autoimmune diseases, such as Crohn disease, ulcerative colitis, lupus History of chest radiation Nervous system problems, such as Guillain-Barre syndrome, myasthenia gravis Organ transplant An unusual or allergic reaction to atezolizumab, other medications, foods, dyes, or preservatives Pregnant or trying to get pregnant Breast-feeding How should I use this medication? This medication is injected  into a vein. It is given by your care team in a hospital or clinic setting. A special MedGuide will be given to you before each treatment. Be sure to read this information carefully each time. Talk to your care team about the use of this medication in children. While it may be prescribed for children as young as 2 years for selected conditions, precautions do apply. Overdosage: If you think you have taken too much of this medicine contact a poison control center or emergency room at once. NOTE: This medicine is only for you. Do not share this medicine with others. What if I miss a dose? Keep appointments for follow-up doses. It is important not to miss your dose. Call your care team if you are unable to keep an appointment. What may interact with this medication? Interactions have not been studied. This list may not describe all possible interactions. Give your health care provider a list of all the medicines, herbs, non-prescription drugs, or dietary supplements you use. Also tell them if you smoke, drink alcohol, or use illegal drugs. Some items may interact with your medicine. What should I watch for while using this medication? Your condition will be monitored carefully while you are receiving this medication. You may need blood work while taking this medication. This medication may cause serious skin reactions. They can happen weeks to months after starting the medication. Contact your care team right away if you notice fevers or flu-like symptoms with a rash. The rash may be red or purple and then turn into blisters or peeling of the skin. You may also notice a red rash with swelling of the face, lips,  or lymph nodes in your neck or under your arms. Tell your care team right away if you have any change in your eyesight. Talk to your care team if you may be pregnant. Serious birth defects can occur if you take this medication during pregnancy and for 5 months after the last dose. You will need a  negative pregnancy test before starting this medication. Contraception is recommended while taking this medication and for 5 months after the last dose. Your care team can help you find the option that works for you. Do not breastfeed while taking this medication and for at least 5 months after the last dose. What side effects may I notice from receiving this medication? Side effects that you should report to your doctor or health care professional as soon as possible: Allergic reactions--skin rash, itching, hives, swelling of the face, lips, tongue, or throat Dry cough, shortness of breath or trouble breathing Eye pain, redness, irritation, or discharge with blurry or decreased vision Heart muscle inflammation--unusual weakness or fatigue, shortness of breath, chest pain, fast or irregular heartbeat, dizziness, swelling of the ankles, feet, or hands Hormone gland problems--headache, sensitivity to light, unusual weakness or fatigue, dizziness, fast or irregular heartbeat, increased sensitivity to cold or heat, excessive sweating, constipation, hair loss, increased thirst or amount of urine, tremors or shaking, irritability Infusion reactions--chest pain, shortness of breath or trouble breathing, feeling faint or lightheaded Kidney injury (glomerulonephritis)--decrease in the amount of urine, red or dark Tyrann Donaho urine, foamy or bubbly urine, swelling of the ankles, hands, or feet Liver injury--right upper belly pain, loss of appetite, nausea, light-colored stool, dark yellow or Ioan Landini urine, yellowing skin or eyes, unusual weakness or fatigue Pain, tingling, or numbness in the hands or feet, muscle weakness, change in vision, confusion or trouble speaking, loss of balance or coordination, trouble walking, seizures Rash, fever, and swollen lymph nodes Redness, blistering, peeling, or loosening of the skin, including inside the mouth Sudden or severe stomach pain, bloody diarrhea, fever, nausea,  vomiting Side effects that usually do not require medical attention (report to your doctor or health care professional if they continue or are bothersome): Bone, joint, or muscle pain Diarrhea Fatigue Loss of appetite Nausea Skin rash This list may not describe all possible side effects. Call your doctor for medical advice about side effects. You may report side effects to FDA at 1-800-FDA-1088. Where should I keep my medication? This medication is given in a hospital or clinic. It will not be stored at home. NOTE: This sheet is a summary. It may not cover all possible information. If you have questions about this medicine, talk to your doctor, pharmacist, or health care provider.  2023 Elsevier/Gold Standard (2022-02-03 00:00:00)        To help prevent nausea and vomiting after your treatment, we encourage you to take your nausea medication as directed.  BELOW ARE SYMPTOMS THAT SHOULD BE REPORTED IMMEDIATELY: *FEVER GREATER THAN 100.4 F (38 C) OR HIGHER *CHILLS OR SWEATING *NAUSEA AND VOMITING THAT IS NOT CONTROLLED WITH YOUR NAUSEA MEDICATION *UNUSUAL SHORTNESS OF BREATH *UNUSUAL BRUISING OR BLEEDING *URINARY PROBLEMS (pain or burning when urinating, or frequent urination) *BOWEL PROBLEMS (unusual diarrhea, constipation, pain near the anus) TENDERNESS IN MOUTH AND THROAT WITH OR WITHOUT PRESENCE OF ULCERS (sore throat, sores in mouth, or a toothache) UNUSUAL RASH, SWELLING OR PAIN  UNUSUAL VAGINAL DISCHARGE OR ITCHING   Items with * indicate a potential emergency and should be followed up as soon as possible or go  to the Emergency Department if any problems should occur.  Please show the CHEMOTHERAPY ALERT CARD or IMMUNOTHERAPY ALERT CARD at check-in to the Emergency Department and triage nurse.  Should you have questions after your visit or need to cancel or reschedule your appointment, please contact [facility] [phone]  and follow the prompts.   Office hours are 8:00 a.m. to 4:30 p.m. Monday - Friday. Please note that voicemails left after 4:00 p.m. may not be returned until the following business day.  We are closed weekends and major holidays. You have access to a nurse at all times for urgent questions. Please call the main number to the clinic [phone] and follow the prompts.  For any non-urgent questions, you may also contact your provider using MyChart. We now offer e-Visits for anyone 57 and older to request care online for non-urgent symptoms. For details visit [email or web address].   Also download the MyChart app! Go to the app store, search "MyChart", open the app, select [facility], and log in with your MyChart username and password.  Masks are optional in the cancer centers. If you would like for your care team to wear a mask while they are taking care of you, please let them know. You may have one support person who is at least 71 years old accompany you for your appointments.

## 2022-09-29 NOTE — Progress Notes (Signed)
Patient has been assessed, vital signs and labs have been reviewed by Dr. Katragadda. ANC, Creatinine, LFTs, and Platelets are within treatment parameters per Dr. Katragadda. The patient is good to proceed with treatment at this time. Primary RN and pharmacy aware.  

## 2022-09-29 NOTE — Progress Notes (Signed)
[place], [place] [address]   CLINIC:  Medical Oncology/Hematology  PCP:  Larry Moore, [place] / [place] [address] [phone]   REASON FOR VISIT:  Follow-up for right lung mass and mediastinal lymphadenopathy  PRIOR THERAPY: 1.  4 cycles of carboplatin, etoposide and Atezolizumab completed on 04/29/2022  NGS Results: not done  CURRENT THERAPY: Maintenance Atezolizumab every 3 weeks  BRIEF ONCOLOGIC HISTORY:  Oncology History  Small cell lung cancer, right upper lobe ([place])  01/22/2022 Initial Diagnosis   Small cell lung cancer, right upper lobe ([place])   01/22/2022 Cancer Staging   Staging form: Lung, AJCC 8th Edition - Clinical stage from 01/22/2022: Stage IVB (cT4, cN2, pM1c) - Signed by Larry Jack, MD on 02/16/2022 Histopathologic type: Small cell carcinoma, NOS Stage prefix: Initial diagnosis   02/23/2022 - 05/25/2022 Chemotherapy   Patient is on Treatment Plan : LUNG SCLC Carboplatin + Etoposide + Atezolizumab Induction q21d / Atezolizumab Maintenance q21d     02/23/2022 -  Chemotherapy   Patient is on Treatment Plan : LUNG SCLC Carboplatin + Etoposide + Atezolizumab Induction q21d x 4 cycles / Atezolizumab Maintenance q21d       CANCER STAGING:  Cancer Staging  Small cell lung cancer, right upper lobe ([place]) Staging form: Lung, AJCC 8th Edition - Clinical stage from 01/22/2022: Stage IVB (cT4, cN2, pM1c) - Signed by Larry Jack, MD on 02/16/2022   INTERVAL HISTORY:  [name], a 71 y.o. male, seen for follow-up of small cell lung cancer.  He is tolerating Atezolizumab very well.  Denies any immunotherapy related side effects.  Chronic cough and shortness of breath have been stable.  Energy levels are 70%.   REVIEW OF SYSTEMS:  Review of Systems  Constitutional:  Negative for appetite change and fatigue.  HENT:   Negative for trouble swallowing.   Cardiovascular:  Negative for  palpitations.  Gastrointestinal:  Negative for diarrhea.  All other systems reviewed and are negative.   PAST MEDICAL/SURGICAL HISTORY:  Past Medical History:  Diagnosis Date   Dyspnea    Dysrhythmia    hx a-fib   Hypertension    Insomnia    Pre-diabetes    Sleep apnea    Past Surgical History:  Procedure Laterality Date   ATRIAL FIBRILLATION ABLATION     approx 2020   CARDIAC CATHETERIZATION     HERNIA REPAIR     IR IMAGING GUIDED PORT INSERTION  02/17/2022   IR VENO/JUGULAR RIGHT  02/17/2022   VIDEO BRONCHOSCOPY WITH ENDOBRONCHIAL ULTRASOUND Bilateral 01/29/2022   Procedure: VIDEO BRONCHOSCOPY WITH ENDOBRONCHIAL ULTRASOUND;  Surgeon: Garner Nash, DO;  Location: [facility];  Service: Cardiopulmonary;  Laterality: Bilateral;  w/ Guardant [id number]    SOCIAL HISTORY:  Social History   Socioeconomic History   Marital status: Married    Spouse name: Not on file   Number of children: Not on file   Years of education: Not on file   Highest education level: Not on file  Occupational History   Occupation: Retired  Tobacco Use   Smoking status: Every Day    Types: Cigarettes    Passive exposure: Current   Smokeless tobacco: Not on file   Tobacco comments:    Pt smokes 1 ppd. 01/26/22  Vaping Use   Vaping Use: Never used  Substance and Sexual Activity   Alcohol use: Not Currently    Comment: 1 bottle wine a day  Drug use: No   Sexual activity: Not on file  Other Topics Concern   Not on file  Social History Narrative   Not on file   Social Determinants of Health   Financial Resource Strain: Not on file  Food Insecurity: Not on file  Transportation Needs: Not on file  Physical Activity: Not on file  Stress: Not on file  Social Connections: Not on file  Intimate Partner Violence: Not on file    FAMILY HISTORY:  Family History  Problem Relation Age of Onset   Coronary artery disease Father    Diabetes Father     CURRENT MEDICATIONS:  Current Outpatient  Medications  Medication Sig Dispense Refill   allopurinol (ZYLOPRIM) 300 MG tablet Take 600 mg by mouth daily.     ALPRAZolam (XANAX) 1 MG tablet Take 1 tablet (1 mg total) by mouth at bedtime as needed for anxiety. 30 tablet 3   Atezolizumab (TECENTRIQ IV) Inject into the vein every 21 ( twenty-one) days.     furosemide (LASIX) 80 MG tablet Take 80 mg by mouth daily.     lidocaine-prilocaine (EMLA) cream Apply topically as directed.     metolazone (ZAROXOLYN) 2.5 MG tablet Take 2.5 mg by mouth daily.     potassium chloride (KLOR-CON M) 10 MEQ tablet Take 1 tablet (10 mEq total) by mouth 2 (two) times daily. 180 tablet 3   sotalol (BETAPACE) 80 MG tablet SMARTSIG:1 Tablet(s) By Mouth Every 12 Hours     Vitamin D, Ergocalciferol, (DRISDOL) 1.25 MG (50000 UNIT) CAPS capsule Take 50,000 Units by mouth daily.     No current facility-administered medications for this visit.    ALLERGIES:  No Known Allergies  PHYSICAL EXAM:  Performance status (ECOG): 1 - Symptomatic but completely ambulatory  There were no vitals filed for this visit.  Wt Readings from Last 3 Encounters:  09/29/22 243 lb 3.2 oz (110.3 kg)  09/08/22 236 lb 5.3 oz (107.2 kg)  08/18/22 238 lb (108 kg)   Physical Exam Vitals reviewed.  Constitutional:      Appearance: Normal appearance.  Cardiovascular:     Rate and Rhythm: Normal rate and regular rhythm.     Pulses: Normal pulses.     Heart sounds: Normal heart sounds.  Pulmonary:     Effort: Pulmonary effort is normal.     Breath sounds: Normal breath sounds.  Neurological:     General: No focal deficit present.     Mental Status: He is alert and oriented to person, place, and time.  Psychiatric:        Mood and Affect: Mood normal.        Behavior: Behavior normal.     LABORATORY DATA:  I have reviewed the labs as listed.     Latest Ref Rng & Units 09/29/2022    9:58 AM 09/08/2022   12:10 PM 08/18/2022   12:13 PM  CBC  WBC 4.0 - 10.5 K/uL 7.4  8.1   5.6   Hemoglobin 13.0 - 17.0 g/dL 14.8  15.3  15.0   Hematocrit 39.0 - 52.0 % 45.1  46.4  44.7   Platelets 150 - 400 K/uL 173  224  167       Latest Ref Rng & Units 09/29/2022    9:58 AM 09/08/2022   12:10 PM 08/18/2022   12:13 PM  CMP  Glucose 70 - 99 mg/dL 108  99  94   BUN 8 - 23 mg/dL 10  14  10   Creatinine 0.61 - 1.24 mg/dL 0.73  0.81  0.66   Sodium 135 - 145 mmol/L 137  137  138   Potassium 3.5 - 5.1 mmol/L 3.8  2.7  3.7   Chloride 98 - 111 mmol/L 102  97  104   CO2 22 - 32 mmol/L 29  29  29    Calcium 8.9 - 10.3 mg/dL 8.0  8.5  8.3   Total Protein 6.5 - 8.1 g/dL 6.7  7.1  6.9   Total Bilirubin 0.3 - 1.2 mg/dL 1.3  1.1  1.0   Alkaline Phos 38 - 126 U/L 69  76  72   AST 15 - 41 U/L 13  11  12    ALT 0 - 44 U/L 10  10  10      DIAGNOSTIC IMAGING:  I have independently reviewed the scans and discussed with the patient. No results found.   ASSESSMENT:  Extensive stage small cell lung cancer: - Productive cough for 3 weeks with hemoptysis.  Hemoptysis improved after Eliquis stopped 2 weeks ago.  Still has some purulent sputum.  Currently on Levaquin. - Abnormal chest x-ray on 01/19/2022 for productive cough - CT chest with contrast (01/21/2022): Right hilar mass measuring 6.5 x 5.2 cm surrounding and compressing the right upper lobe bronchus, bronchus intermedius, right middle lobe bronchus and right lower lobe bronchus centrally.  Compression of the right lower lobe pulmonary artery.  Nodular soft tissue lesion in the right lower lobe continuous with right hilar mass and could represent endobronchial spread.  Enlarged mediastinal lymph nodes but no findings of pulmonary metastatic disease.  Indeterminate bilateral adrenal gland nodules and low attenuation liver lesions, likely benign cyst. - No weight loss.  No chest pains. - PET scan: Right hilar mass involving upper lobe, middle lobe and lower lobe with satellite nodularity in the right lower lobe, hypermetabolic subcarinal and  right paratracheal lymphadenopathy.  Pleural-based nodule along the right posterior costophrenic angle.  Hypermetabolic left adrenal lesion.  Small hypermetabolic lesions in the humerus bilaterally and a subcutaneous focus in the right posterior lower back. - Right lung hilar mass biopsy (01/29/2022): Small cell carcinoma -4 cycles of carboplatin, etoposide and atezolizumab from 02/23/2022 through 04/29/2022, maintenance Atezolizumab ongoing    Social/family history: - He lives at home with his wife Larry Moore.  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  Retired 8 years ago.  Smoked 1 pack/day for 50 years. - Father had renal cell carcinoma.   PLAN:  Extensive stage small cell lung cancer: - CT CAP on 08/10/2022 showed unchanged right perihilar soft tissue thickening and mildly enlarged right hilar lymph node with no evidence of progression.  Treatment changes in the right lower lobe.  Small pericardial effusion slightly increased in size from prior scan. - Reviewed labs today which showed normal LFTs and creatinine.  CBC was grossly normal.  TSH was 0.6. - Proceed with Atezolizumab maintenance today and in 3 weeks.  RTC 6 weeks with CT CAP and MRI of the brain.  2.  Difficulty sleeping: - He is able to sleep well without any prescription medications.  He is reportedly taking some THC supplements which are helping.  3.  Hypokalemia: - Continue potassium supplements daily.  Potassium is normal today.   Orders placed this encounter:  Orders Placed This Encounter  Procedures   CT CHEST ABDOMEN PELVIS W CONTRAST   MR Brain W Wo Contrast   CBC with Differential   Comprehensive metabolic panel  CBC with Differential   Comprehensive metabolic panel   CBC with Differential   Comprehensive metabolic panel   CBC with Differential   Comprehensive metabolic panel   CBC with Differential   Comprehensive metabolic panel      Larry Jack, MD [facility] [phone]

## 2022-09-29 NOTE — Patient Instructions (Signed)
[facility]  Discharge Instructions  You were seen and examined today by Dr. Delton Coombes.  Proceed with treatment today and again in 3 weeks. Dr. Delton Coombes will see you again in 6 weeks, you will have a CT scan and a brain MRI prior to that appointment.  Thank you for choosing [facility] to provide your oncology and hematology care.   To afford each patient quality time with our provider, please arrive at least 15 minutes before your scheduled appointment time. You may need to reschedule your appointment if you arrive late (10 or more minutes). Arriving late affects you and other patients whose appointments are after yours.  Also, if you miss three or more appointments without notifying the office, you may be dismissed from the clinic at the provider's discretion.    Again, thank you for choosing [facility].  Our hope is that these requests will decrease the amount of time that you wait before being seen by our physicians.   If you have a lab appointment with the [facility] please come in thru the Main Entrance and check in at the main information desk.           _____________________________________________________________  Should you have questions after your visit to [facility], please contact our office at [phone] and follow the prompts.  Our office hours are 8:00 a.m. to 4:30 p.m. Monday - Thursday and 8:00 a.m. to 2:30 p.m. Friday.  Please note that voicemails left after 4:00 p.m. may not be returned until the following business day.  We are closed weekends and all major holidays.  You do have access to a nurse 24-7, just call the main number to the clinic [phone] and do not press any options, hold on the line and a nurse will answer the phone.    For prescription refill requests, have your pharmacy contact our office and allow 72 hours.    Masks are optional in the cancer centers. If you  would like for your care team to wear a mask while they are taking care of you, please let them know. You may have one support person who is at least 71 years old accompany you for your appointments.

## 2022-09-29 NOTE — Progress Notes (Signed)
Patient presents today for chemotherapy treatment.  Patient is in satisfactory condition with no new complaints voiced.  Vital signs are stable.  Labs reviewed by Dr. Delton Coombes during his office visit.  All labs are within treatment parameters.  No blood return noted at port flush.  Labs drawn peripherally.  Alteplase 2 mg administered at 1015.  10 mL of blood return discarded at 1053.  Port flushed without difficulty and good blood return noted.  We will proceed with treatment per MD orders.   Patient tolerated treatment well with no complaints voiced.  Patient left ambulatory in stable condition.  Vital signs stable at discharge.  Follow up as scheduled.

## 2022-09-30 ENCOUNTER — Other Ambulatory Visit: Payer: Self-pay

## 2022-09-30 LAB — T4: T4, Total: 7.3 ug/dL (ref 4.5–12.0)

## 2022-10-03 ENCOUNTER — Other Ambulatory Visit: Payer: Self-pay

## 2022-10-11 ENCOUNTER — Other Ambulatory Visit: Payer: Self-pay

## 2022-10-20 ENCOUNTER — Other Ambulatory Visit (HOSPITAL_COMMUNITY): Payer: Self-pay | Admitting: Hematology

## 2022-10-20 ENCOUNTER — Other Ambulatory Visit: Payer: Self-pay | Admitting: Hematology

## 2022-10-20 ENCOUNTER — Inpatient Hospital Stay: Payer: PPO

## 2022-10-20 ENCOUNTER — Inpatient Hospital Stay: Payer: PPO | Admitting: Hematology

## 2022-10-20 ENCOUNTER — Inpatient Hospital Stay: Payer: PPO | Attending: Hematology

## 2022-10-20 ENCOUNTER — Other Ambulatory Visit: Payer: Self-pay | Admitting: *Deleted

## 2022-10-20 VITALS — BP 107/73 | HR 68 | Temp 97.1°F | Resp 18

## 2022-10-20 DIAGNOSIS — Z79899 Other long term (current) drug therapy: Secondary | ICD-10-CM | POA: Insufficient documentation

## 2022-10-20 DIAGNOSIS — C3411 Malignant neoplasm of upper lobe, right bronchus or lung: Secondary | ICD-10-CM

## 2022-10-20 DIAGNOSIS — Z5112 Encounter for antineoplastic immunotherapy: Secondary | ICD-10-CM | POA: Diagnosis not present

## 2022-10-20 DIAGNOSIS — F17210 Nicotine dependence, cigarettes, uncomplicated: Secondary | ICD-10-CM | POA: Insufficient documentation

## 2022-10-20 LAB — CBC WITH DIFFERENTIAL/PLATELET
Abs Immature Granulocytes: 0.03 10*3/uL (ref 0.00–0.07)
Basophils Absolute: 0 10*3/uL (ref 0.0–0.1)
Basophils Relative: 1 %
Eosinophils Absolute: 0.2 10*3/uL (ref 0.0–0.5)
Eosinophils Relative: 3 %
HCT: 47 % (ref 39.0–52.0)
Hemoglobin: 15.5 g/dL (ref 13.0–17.0)
Immature Granulocytes: 1 %
Lymphocytes Relative: 13 %
Lymphs Abs: 0.8 10*3/uL (ref 0.7–4.0)
MCH: 30.4 pg (ref 26.0–34.0)
MCHC: 33 g/dL (ref 30.0–36.0)
MCV: 92.2 fL (ref 80.0–100.0)
Monocytes Absolute: 0.6 10*3/uL (ref 0.1–1.0)
Monocytes Relative: 9 %
Neutro Abs: 4.7 10*3/uL (ref 1.7–7.7)
Neutrophils Relative %: 73 %
Platelets: 195 10*3/uL (ref 150–400)
RBC: 5.1 MIL/uL (ref 4.22–5.81)
RDW: 14.6 % (ref 11.5–15.5)
WBC: 6.4 10*3/uL (ref 4.0–10.5)
nRBC: 0 % (ref 0.0–0.2)

## 2022-10-20 LAB — COMPREHENSIVE METABOLIC PANEL
ALT: 9 U/L (ref 0–44)
AST: 14 U/L — ABNORMAL LOW (ref 15–41)
Albumin: 3.7 g/dL (ref 3.5–5.0)
Alkaline Phosphatase: 73 U/L (ref 38–126)
Anion gap: 9 (ref 5–15)
BUN: 15 mg/dL (ref 8–23)
CO2: 30 mmol/L (ref 22–32)
Calcium: 8.2 mg/dL — ABNORMAL LOW (ref 8.9–10.3)
Chloride: 100 mmol/L (ref 98–111)
Creatinine, Ser: 0.84 mg/dL (ref 0.61–1.24)
GFR, Estimated: >60 mL/min (ref >60)
Glucose, Bld: 108 mg/dL — ABNORMAL HIGH (ref 70–99)
Potassium: 3.5 mmol/L (ref 3.5–5.1)
Sodium: 139 mmol/L (ref 135–145)
Total Bilirubin: 1 mg/dL (ref 0.3–1.2)
Total Protein: 6.7 g/dL (ref 6.5–8.1)

## 2022-10-20 LAB — MAGNESIUM: Magnesium: 2 mg/dL (ref 1.7–2.4)

## 2022-10-20 LAB — TSH: TSH: 0.738 u[IU]/mL (ref 0.350–4.500)

## 2022-10-20 MED ORDER — SODIUM CHLORIDE 0.9 % IV SOLN: Freq: Once | INTRAVENOUS | Status: AC

## 2022-10-20 MED ORDER — SODIUM CHLORIDE 0.9% FLUSH
10.0000 mL | INTRAVENOUS | Status: DC | PRN
  Administered 2022-10-20: 10 mL

## 2022-10-20 MED ORDER — SODIUM CHLORIDE 0.9% FLUSH
10.0000 mL | Freq: Once | INTRAVENOUS | Status: AC
  Administered 2022-10-20: 10 mL via INTRAVENOUS

## 2022-10-20 MED ORDER — HEPARIN SOD (PORK) LOCK FLUSH 100 UNIT/ML IV SOLN
500.0000 [IU] | Freq: Once | INTRAVENOUS | Status: AC | PRN
  Administered 2022-10-20: 500 [IU]

## 2022-10-20 MED ORDER — SODIUM CHLORIDE 0.9 % IV SOLN
1200.0000 mg | Freq: Once | INTRAVENOUS | Status: AC
  Administered 2022-10-20: 1200 mg via INTRAVENOUS
  Filled 2022-10-20: qty 20

## 2022-10-20 MED ORDER — ALPRAZOLAM 2 MG PO TABS: 2.0000 mg | ORAL_TABLET | Freq: Every evening | ORAL | 3 refills | Status: DC | PRN

## 2022-10-20 NOTE — Progress Notes (Signed)
Patients port flushed without difficulty.  Good blood return noted with no bruising or swelling noted at site.  Stable during access and blood draw.  Patient to remain accessed for treatment. 

## 2022-10-20 NOTE — Patient Instructions (Signed)
[facility]  Discharge Instructions: Thank you for choosing [facility] to provide your oncology and hematology care.  If you have a lab appointment with the Cancer Center, please come in thru the Main Entrance and check in at the main information desk.  Wear comfortable clothing and clothing appropriate for easy access to any Portacath or PICC line.   We strive to give you quality time with your provider. You may need to reschedule your appointment if you arrive late (15 or more minutes).  Arriving late affects you and other patients whose appointments are after yours.  Also, if you miss three or more appointments without notifying the office, you may be dismissed from the clinic at the provider's discretion.      For prescription refill requests, have your pharmacy contact our office and allow 72 hours for refills to be completed.    Today you received the following chemotherapy and/or immunotherapy agents Tecentriq    To help prevent nausea and vomiting after your treatment, we encourage you to take your nausea medication as directed.  Atezolizumab Injection What is this medication? ATEZOLIZUMAB (a te zoe LIZ ue mab) treats some types of cancer. It works by helping your immune system slow or stop the spread of cancer cells. It is a monoclonal antibody. This medicine may be used for other purposes; ask your health care provider or pharmacist if you have questions. COMMON BRAND NAME(S): Tecentriq What should I tell my care team before I take this medication? They need to know if you have any of these conditions: Allogeneic stem cell transplant (uses someone else's stem cells) Autoimmune diseases, such as Crohn disease, ulcerative colitis, lupus History of chest radiation Nervous system problems, such as Guillain-Barre syndrome, myasthenia gravis Organ transplant An unusual or allergic reaction to atezolizumab, other medications, foods, dyes, or  preservatives Pregnant or trying to get pregnant Breast-feeding How should I use this medication? This medication is injected into a vein. It is given by your care team in a hospital or clinic setting. A special MedGuide will be given to you before each treatment. Be sure to read this information carefully each time. Talk to your care team about the use of this medication in children. While it may be prescribed for children as young as 2 years for selected conditions, precautions do apply. Overdosage: If you think you have taken too much of this medicine contact a poison control center or emergency room at once. NOTE: This medicine is only for you. Do not share this medicine with others. What if I miss a dose? Keep appointments for follow-up doses. It is important not to miss your dose. Call your care team if you are unable to keep an appointment. What may interact with this medication? Interactions have not been studied. This list may not describe all possible interactions. Give your health care provider a list of all the medicines, herbs, non-prescription drugs, or dietary supplements you use. Also tell them if you smoke, drink alcohol, or use illegal drugs. Some items may interact with your medicine. What should I watch for while using this medication? Your condition will be monitored carefully while you are receiving this medication. You may need blood work while taking this medication. This medication may cause serious skin reactions. They can happen weeks to months after starting the medication. Contact your care team right away if you notice fevers or flu-like symptoms with a rash. The rash may be red or purple and then  turn into blisters or peeling of the skin. You may also notice a red rash with swelling of the face, lips, or lymph nodes in your neck or under your arms. Tell your care team right away if you have any change in your eyesight. Talk to your care team if you may be pregnant.  Serious birth defects can occur if you take this medication during pregnancy and for 5 months after the last dose. You will need a negative pregnancy test before starting this medication. Contraception is recommended while taking this medication and for 5 months after the last dose. Your care team can help you find the option that works for you. Do not breastfeed while taking this medication and for at least 5 months after the last dose. What side effects may I notice from receiving this medication? Side effects that you should report to your doctor or health care professional as soon as possible: Allergic reactions--skin rash, itching, hives, swelling of the face, lips, tongue, or throat Dry cough, shortness of breath or trouble breathing Eye pain, redness, irritation, or discharge with blurry or decreased vision Heart muscle inflammation--unusual weakness or fatigue, shortness of breath, chest pain, fast or irregular heartbeat, dizziness, swelling of the ankles, feet, or hands Hormone gland problems--headache, sensitivity to light, unusual weakness or fatigue, dizziness, fast or irregular heartbeat, increased sensitivity to cold or heat, excessive sweating, constipation, hair loss, increased thirst or amount of urine, tremors or shaking, irritability Infusion reactions--chest pain, shortness of breath or trouble breathing, feeling faint or lightheaded Kidney injury (glomerulonephritis)--decrease in the amount of urine, red or dark brown urine, foamy or bubbly urine, swelling of the ankles, hands, or feet Liver injury--right upper belly pain, loss of appetite, nausea, light-colored stool, dark yellow or brown urine, yellowing skin or eyes, unusual weakness or fatigue Pain, tingling, or numbness in the hands or feet, muscle weakness, change in vision, confusion or trouble speaking, loss of balance or coordination, trouble walking, seizures Rash, fever, and swollen lymph nodes Redness, blistering,  peeling, or loosening of the skin, including inside the mouth Sudden or severe stomach pain, bloody diarrhea, fever, nausea, vomiting Side effects that usually do not require medical attention (report to your doctor or health care professional if they continue or are bothersome): Bone, joint, or muscle pain Diarrhea Fatigue Loss of appetite Nausea Skin rash This list may not describe all possible side effects. Call your doctor for medical advice about side effects. You may report side effects to FDA at 1-800-FDA-1088. Where should I keep my medication? This medication is given in a hospital or clinic. It will not be stored at home. NOTE: This sheet is a summary. It may not cover all possible information. If you have questions about this medicine, talk to your doctor, pharmacist, or health care provider.  2023 Elsevier/Gold Standard (2022-02-03 00:00:00)   BELOW ARE SYMPTOMS THAT SHOULD BE REPORTED IMMEDIATELY: *FEVER GREATER THAN 100.4 F (38 C) OR HIGHER *CHILLS OR SWEATING *NAUSEA AND VOMITING THAT IS NOT CONTROLLED WITH YOUR NAUSEA MEDICATION *UNUSUAL SHORTNESS OF BREATH *UNUSUAL BRUISING OR BLEEDING *URINARY PROBLEMS (pain or burning when urinating, or frequent urination) *BOWEL PROBLEMS (unusual diarrhea, constipation, pain near the anus) TENDERNESS IN MOUTH AND THROAT WITH OR WITHOUT PRESENCE OF ULCERS (sore throat, sores in mouth, or a toothache) UNUSUAL RASH, SWELLING OR PAIN  UNUSUAL VAGINAL DISCHARGE OR ITCHING   Items with * indicate a potential emergency and should be followed up as soon as possible or go to the Emergency Department  if any problems should occur.  Please show the CHEMOTHERAPY ALERT CARD or IMMUNOTHERAPY ALERT CARD at check-in to the Emergency Department and triage nurse.  Should you have questions after your visit or need to cancel or reschedule your appointment, please contact [facility] [phone]  and follow the prompts.  Office  hours are 8:00 a.m. to 4:30 p.m. Monday - Friday. Please note that voicemails left after 4:00 p.m. may not be returned until the following business day.  We are closed weekends and major holidays. You have access to a nurse at all times for urgent questions. Please call the main number to the clinic [phone] and follow the prompts.  For any non-urgent questions, you may also contact your provider using MyChart. We now offer e-Visits for anyone 24 and older to request care online for non-urgent symptoms. For details visit [email or web address].   Also download the MyChart app! Go to the app store, search "MyChart", open the app, select [facility], and log in with your MyChart username and password.

## 2022-10-20 NOTE — Progress Notes (Signed)
Pt presents today for Tecentriq per provider's order. Vital signs and labs WNL for treatment. Okay to proceed with treatment today.  Tecentriq given today per MD orders. Tolerated infusion without adverse affects. Vital signs stable. No complaints at this time. Discharged from clinic ambulatory in stable condition. Alert and oriented x 3. F/U with [facility] as scheduled.

## 2022-10-21 LAB — T4: T4, Total: 7.4 ug/dL (ref 4.5–12.0)

## 2022-10-25 ENCOUNTER — Other Ambulatory Visit: Payer: Self-pay

## 2022-11-02 ENCOUNTER — Encounter (HOSPITAL_COMMUNITY): Payer: Self-pay | Admitting: Radiology

## 2022-11-02 ENCOUNTER — Ambulatory Visit (HOSPITAL_COMMUNITY)
Admission: RE | Admit: 2022-11-02 | Discharge: 2022-11-02 | Disposition: A | Discharge status: Home / Self Care | Payer: PPO | Source: Ambulatory Visit | Attending: Hematology | Admitting: Hematology

## 2022-11-02 DIAGNOSIS — K8689 Other specified diseases of pancreas: Secondary | ICD-10-CM | POA: Diagnosis not present

## 2022-11-02 DIAGNOSIS — K4090 Unilateral inguinal hernia, without obstruction or gangrene, not specified as recurrent: Secondary | ICD-10-CM | POA: Diagnosis not present

## 2022-11-02 DIAGNOSIS — J329 Chronic sinusitis, unspecified: Secondary | ICD-10-CM | POA: Diagnosis not present

## 2022-11-02 DIAGNOSIS — K5730 Diverticulosis of large intestine without perforation or abscess without bleeding: Secondary | ICD-10-CM | POA: Diagnosis not present

## 2022-11-02 DIAGNOSIS — C3411 Malignant neoplasm of upper lobe, right bronchus or lung: Secondary | ICD-10-CM | POA: Insufficient documentation

## 2022-11-02 DIAGNOSIS — J90 Pleural effusion, not elsewhere classified: Secondary | ICD-10-CM | POA: Diagnosis not present

## 2022-11-02 DIAGNOSIS — C3490 Malignant neoplasm of unspecified part of unspecified bronchus or lung: Secondary | ICD-10-CM | POA: Diagnosis not present

## 2022-11-02 DIAGNOSIS — J9811 Atelectasis: Secondary | ICD-10-CM | POA: Diagnosis not present

## 2022-11-02 DIAGNOSIS — I639 Cerebral infarction, unspecified: Secondary | ICD-10-CM | POA: Diagnosis not present

## 2022-11-02 DIAGNOSIS — K7689 Other specified diseases of liver: Secondary | ICD-10-CM | POA: Diagnosis not present

## 2022-11-02 DIAGNOSIS — J929 Pleural plaque without asbestos: Secondary | ICD-10-CM | POA: Diagnosis not present

## 2022-11-02 MED ORDER — HEPARIN SOD (PORK) LOCK FLUSH 100 UNIT/ML IV SOLN
INTRAVENOUS | Status: AC
  Administered 2022-11-02: 500 [IU] via INTRAVENOUS
  Filled 2022-11-02: qty 5

## 2022-11-02 MED ORDER — GADOBUTROL 1 MMOL/ML IV SOLN
10.0000 mL | Freq: Once | INTRAVENOUS | Status: AC | PRN
  Administered 2022-11-02: 10 mL via INTRAVENOUS

## 2022-11-02 MED ORDER — IOHEXOL 300 MG/ML  SOLN
100.0000 mL | Freq: Once | INTRAMUSCULAR | Status: AC | PRN
  Administered 2022-11-02: 100 mL via INTRAVENOUS

## 2022-11-03 ENCOUNTER — Other Ambulatory Visit: Payer: Self-pay

## 2022-11-09 DIAGNOSIS — G4733 Obstructive sleep apnea (adult) (pediatric): Secondary | ICD-10-CM | POA: Diagnosis not present

## 2022-11-10 ENCOUNTER — Inpatient Hospital Stay (HOSPITAL_BASED_OUTPATIENT_CLINIC_OR_DEPARTMENT_OTHER): Payer: PPO | Admitting: Hematology

## 2022-11-10 ENCOUNTER — Inpatient Hospital Stay: Payer: PPO | Attending: Hematology

## 2022-11-10 ENCOUNTER — Encounter: Payer: Self-pay | Admitting: Hematology

## 2022-11-10 ENCOUNTER — Inpatient Hospital Stay: Payer: PPO

## 2022-11-10 VITALS — BP 104/66 | HR 76 | Temp 97.2°F | Resp 18 | Wt 239.0 lb

## 2022-11-10 DIAGNOSIS — F17210 Nicotine dependence, cigarettes, uncomplicated: Secondary | ICD-10-CM | POA: Diagnosis not present

## 2022-11-10 DIAGNOSIS — Z79899 Other long term (current) drug therapy: Secondary | ICD-10-CM | POA: Insufficient documentation

## 2022-11-10 DIAGNOSIS — C3411 Malignant neoplasm of upper lobe, right bronchus or lung: Secondary | ICD-10-CM

## 2022-11-10 DIAGNOSIS — Z5112 Encounter for antineoplastic immunotherapy: Secondary | ICD-10-CM | POA: Diagnosis not present

## 2022-11-10 DIAGNOSIS — I10 Essential (primary) hypertension: Secondary | ICD-10-CM | POA: Diagnosis not present

## 2022-11-10 LAB — COMPREHENSIVE METABOLIC PANEL
ALT: 10 U/L (ref 0–44)
AST: 12 U/L — ABNORMAL LOW (ref 15–41)
Albumin: 3.8 g/dL (ref 3.5–5.0)
Alkaline Phosphatase: 76 U/L (ref 38–126)
Anion gap: 9 (ref 5–15)
BUN: 17 mg/dL (ref 8–23)
CO2: 29 mmol/L (ref 22–32)
Calcium: 7.9 mg/dL — ABNORMAL LOW (ref 8.9–10.3)
Chloride: 100 mmol/L (ref 98–111)
Creatinine, Ser: 0.97 mg/dL (ref 0.61–1.24)
GFR, Estimated: >60 mL/min (ref >60)
Glucose, Bld: 100 mg/dL — ABNORMAL HIGH (ref 70–99)
Potassium: 3.8 mmol/L (ref 3.5–5.1)
Sodium: 138 mmol/L (ref 135–145)
Total Bilirubin: 0.9 mg/dL (ref 0.3–1.2)
Total Protein: 6.7 g/dL (ref 6.5–8.1)

## 2022-11-10 LAB — CBC WITH DIFFERENTIAL/PLATELET
Abs Immature Granulocytes: 0.04 10*3/uL (ref 0.00–0.07)
Basophils Absolute: 0 10*3/uL (ref 0.0–0.1)
Basophils Relative: 0 %
Eosinophils Absolute: 0.2 10*3/uL (ref 0.0–0.5)
Eosinophils Relative: 4 %
HCT: 46.5 % (ref 39.0–52.0)
Hemoglobin: 15.4 g/dL (ref 13.0–17.0)
Immature Granulocytes: 1 %
Lymphocytes Relative: 16 %
Lymphs Abs: 0.9 10*3/uL (ref 0.7–4.0)
MCH: 30.9 pg (ref 26.0–34.0)
MCHC: 33.1 g/dL (ref 30.0–36.0)
MCV: 93.2 fL (ref 80.0–100.0)
Monocytes Absolute: 0.5 10*3/uL (ref 0.1–1.0)
Monocytes Relative: 9 %
Neutro Abs: 4 10*3/uL (ref 1.7–7.7)
Neutrophils Relative %: 70 %
Platelets: 179 10*3/uL (ref 150–400)
RBC: 4.99 MIL/uL (ref 4.22–5.81)
RDW: 14.7 % (ref 11.5–15.5)
WBC: 5.7 10*3/uL (ref 4.0–10.5)
nRBC: 0 % (ref 0.0–0.2)

## 2022-11-10 LAB — MAGNESIUM: Magnesium: 2 mg/dL (ref 1.7–2.4)

## 2022-11-10 LAB — TSH: TSH: 2.693 u[IU]/mL (ref 0.350–4.500)

## 2022-11-10 MED ORDER — SODIUM CHLORIDE 0.9 % IV SOLN: Freq: Once | INTRAVENOUS | Status: AC

## 2022-11-10 MED ORDER — SODIUM CHLORIDE 0.9 % IV SOLN
1200.0000 mg | Freq: Once | INTRAVENOUS | Status: AC
  Administered 2022-11-10: 1200 mg via INTRAVENOUS
  Filled 2022-11-10: qty 20

## 2022-11-10 MED ORDER — SODIUM CHLORIDE 0.9% FLUSH
10.0000 mL | INTRAVENOUS | Status: DC | PRN
  Administered 2022-11-10: 10 mL

## 2022-11-10 MED ORDER — HEPARIN SOD (PORK) LOCK FLUSH 100 UNIT/ML IV SOLN
500.0000 [IU] | Freq: Once | INTRAVENOUS | Status: AC | PRN
  Administered 2022-11-10: 500 [IU]

## 2022-11-10 MED ORDER — SODIUM CHLORIDE 0.9% FLUSH
10.0000 mL | Freq: Once | INTRAVENOUS | Status: AC
  Administered 2022-11-10: 10 mL via INTRAVENOUS

## 2022-11-10 NOTE — Patient Instructions (Signed)
[facility]  Discharge Instructions: Thank you for choosing [facility] to provide your oncology and hematology care.  If you have a lab appointment with the [facility], please come in thru the Main Entrance and check in at the main information desk.  Wear comfortable clothing and clothing appropriate for easy access to any Portacath or PICC line.   We strive to give you quality time with your provider. You may need to reschedule your appointment if you arrive late (15 or more minutes).  Arriving late affects you and other patients whose appointments are after yours.  Also, if you miss three or more appointments without notifying the office, you may be dismissed from the clinic at the provider's discretion.      For prescription refill requests, have your pharmacy contact our office and allow 72 hours for refills to be completed.    Today you received the following chemotherapy and/or immunotherapy agents Tecentriq.  Atezolizumab Injection What is this medication? ATEZOLIZUMAB (a te zoe LIZ ue mab) treats some types of cancer. It works by helping your immune system slow or stop the spread of cancer cells. It is a monoclonal antibody. This medicine may be used for other purposes; ask your health care provider or pharmacist if you have questions. COMMON BRAND NAME(S): Tecentriq What should I tell my care team before I take this medication? They need to know if you have any of these conditions: Allogeneic stem cell transplant (uses someone else's stem cells) Autoimmune diseases, such as Crohn disease, ulcerative colitis, lupus History of chest radiation Nervous system problems, such as Guillain-Barre syndrome, myasthenia gravis Organ transplant An unusual or allergic reaction to atezolizumab, other medications, foods, dyes, or preservatives Pregnant or trying to get pregnant Breast-feeding How should I use this medication? This medication is injected  into a vein. It is given by your care team in a hospital or clinic setting. A special MedGuide will be given to you before each treatment. Be sure to read this information carefully each time. Talk to your care team about the use of this medication in children. While it may be prescribed for children as young as 2 years for selected conditions, precautions do apply. Overdosage: If you think you have taken too much of this medicine contact a poison control center or emergency room at once. NOTE: This medicine is only for you. Do not share this medicine with others. What if I miss a dose? Keep appointments for follow-up doses. It is important not to miss your dose. Call your care team if you are unable to keep an appointment. What may interact with this medication? Interactions have not been studied. This list may not describe all possible interactions. Give your health care provider a list of all the medicines, herbs, non-prescription drugs, or dietary supplements you use. Also tell them if you smoke, drink alcohol, or use illegal drugs. Some items may interact with your medicine. What should I watch for while using this medication? Your condition will be monitored carefully while you are receiving this medication. You may need blood work while taking this medication. This medication may cause serious skin reactions. They can happen weeks to months after starting the medication. Contact your care team right away if you notice fevers or flu-like symptoms with a rash. The rash may be red or purple and then turn into blisters or peeling of the skin. You may also notice a red rash with swelling of the face, lips, or  lymph nodes in your neck or under your arms. Tell your care team right away if you have any change in your eyesight. Talk to your care team if you may be pregnant. Serious birth defects can occur if you take this medication during pregnancy and for 5 months after the last dose. You will need a  negative pregnancy test before starting this medication. Contraception is recommended while taking this medication and for 5 months after the last dose. Your care team can help you find the option that works for you. Do not breastfeed while taking this medication and for at least 5 months after the last dose. What side effects may I notice from receiving this medication? Side effects that you should report to your doctor or health care professional as soon as possible: Allergic reactions--skin rash, itching, hives, swelling of the face, lips, tongue, or throat Dry cough, shortness of breath or trouble breathing Eye pain, redness, irritation, or discharge with blurry or decreased vision Heart muscle inflammation--unusual weakness or fatigue, shortness of breath, chest pain, fast or irregular heartbeat, dizziness, swelling of the ankles, feet, or hands Hormone gland problems--headache, sensitivity to light, unusual weakness or fatigue, dizziness, fast or irregular heartbeat, increased sensitivity to cold or heat, excessive sweating, constipation, hair loss, increased thirst or amount of urine, tremors or shaking, irritability Infusion reactions--chest pain, shortness of breath or trouble breathing, feeling faint or lightheaded Kidney injury (glomerulonephritis)--decrease in the amount of urine, red or dark Bernarda Erck urine, foamy or bubbly urine, swelling of the ankles, hands, or feet Liver injury--right upper belly pain, loss of appetite, nausea, light-colored stool, dark yellow or Jakeb Lamping urine, yellowing skin or eyes, unusual weakness or fatigue Pain, tingling, or numbness in the hands or feet, muscle weakness, change in vision, confusion or trouble speaking, loss of balance or coordination, trouble walking, seizures Rash, fever, and swollen lymph nodes Redness, blistering, peeling, or loosening of the skin, including inside the mouth Sudden or severe stomach pain, bloody diarrhea, fever, nausea,  vomiting Side effects that usually do not require medical attention (report to your doctor or health care professional if they continue or are bothersome): Bone, joint, or muscle pain Diarrhea Fatigue Loss of appetite Nausea Skin rash This list may not describe all possible side effects. Call your doctor for medical advice about side effects. You may report side effects to FDA at 1-800-FDA-1088. Where should I keep my medication? This medication is given in a hospital or clinic. It will not be stored at home. NOTE: This sheet is a summary. It may not cover all possible information. If you have questions about this medicine, talk to your doctor, pharmacist, or health care provider.  2023 Elsevier/Gold Standard (2022-02-03 00:00:00)        To help prevent nausea and vomiting after your treatment, we encourage you to take your nausea medication as directed.  BELOW ARE SYMPTOMS THAT SHOULD BE REPORTED IMMEDIATELY: *FEVER GREATER THAN 100.4 F (38 C) OR HIGHER *CHILLS OR SWEATING *NAUSEA AND VOMITING THAT IS NOT CONTROLLED WITH YOUR NAUSEA MEDICATION *UNUSUAL SHORTNESS OF BREATH *UNUSUAL BRUISING OR BLEEDING *URINARY PROBLEMS (pain or burning when urinating, or frequent urination) *BOWEL PROBLEMS (unusual diarrhea, constipation, pain near the anus) TENDERNESS IN MOUTH AND THROAT WITH OR WITHOUT PRESENCE OF ULCERS (sore throat, sores in mouth, or a toothache) UNUSUAL RASH, SWELLING OR PAIN  UNUSUAL VAGINAL DISCHARGE OR ITCHING   Items with * indicate a potential emergency and should be followed up as soon as possible or go to  the Emergency Department if any problems should occur.  Please show the CHEMOTHERAPY ALERT CARD or IMMUNOTHERAPY ALERT CARD at check-in to the Emergency Department and triage nurse.  Should you have questions after your visit or need to cancel or reschedule your appointment, please contact [facility] [phone]  and follow the prompts.   Office hours are 8:00 a.m. to 4:30 p.m. Monday - Friday. Please note that voicemails left after 4:00 p.m. may not be returned until the following business day.  We are closed weekends and major holidays. You have access to a nurse at all times for urgent questions. Please call the main number to the clinic [phone] and follow the prompts.  For any non-urgent questions, you may also contact your provider using MyChart. We now offer e-Visits for anyone 18 and older to request care online for non-urgent symptoms. For details visit [email or web address].   Also download the MyChart app! Go to the app store, search "MyChart", open the app, select [facility], and log in with your MyChart username and password.

## 2022-11-10 NOTE — Patient Instructions (Signed)
[facility] at [facility] Discharge Instructions   You were seen and examined today by Dr. Delton Coombes.  He reviewed the results of you brain MRI which was normal.   He reviewed the results of your CT scan which was stable.   Your lab work from today is pending.   We will proceed with your treatment today pending the lab results.   Return as scheduled.    Thank you for choosing [facility] to provide your oncology and hematology care.  To afford each patient quality time with our provider, please arrive at least 15 minutes before your scheduled appointment time.   If you have a lab appointment with the [facility] please come in thru the Main Entrance and check in at the main information desk.  You need to re-schedule your appointment should you arrive 10 or more minutes late.  We strive to give you quality time with our providers, and arriving late affects you and other patients whose appointments are after yours.  Also, if you no show three or more times for appointments you may be dismissed from the clinic at the providers discretion.     Again, thank you for choosing [facility].  Our hope is that these requests will decrease the amount of time that you wait before being seen by our physicians.       _____________________________________________________________  Should you have questions after your visit to [facility], please contact our office at [phone] and follow the prompts.  Our office hours are 8:00 a.m. and 4:30 p.m. Monday - Friday.  Please note that voicemails left after 4:00 p.m. may not be returned until the following business day.  We are closed weekends and major holidays.  You do have access to a nurse 24-7, just call the main number to the clinic [phone] and do not press any options, hold on the line and a nurse will answer the phone.    For prescription refill requests,  have your pharmacy contact our office and allow 72 hours.    Due to Covid, you will need to wear a mask upon entering the hospital. If you do not have a mask, a mask will be given to you at the Main Entrance upon arrival. For doctor visits, patients may have 1 support person age 60 or older with them. For treatment visits, patients can not have anyone with them due to social distancing guidelines and our immunocompromised population.

## 2022-11-10 NOTE — Progress Notes (Signed)
Patient presents today for chemotherapy infusion.  Patient is in satisfactory condition with no complaints voiced.  Vital signs are stable.  Labs reviewed by Dr. Delton Coombes during his office visit.  All labs are within treatment parameters.  We will proceed with treatment per MD orders.   Patient tolerated treatment well with no complaints voiced.  Patient left ambulatory in stable condition.  Vital signs stable at discharge.  Follow up as scheduled.

## 2022-11-11 ENCOUNTER — Encounter (HOSPITAL_COMMUNITY): Payer: Self-pay | Admitting: Hematology

## 2022-11-11 ENCOUNTER — Encounter: Payer: Self-pay | Admitting: Hematology

## 2022-11-11 ENCOUNTER — Other Ambulatory Visit: Payer: Self-pay

## 2022-11-11 NOTE — Progress Notes (Signed)
Larry Moore, Larry Moore [id number]   CLINIC:  Medical Oncology/Hematology  PCP:  Larry Moore, [place] / [place] [id number] [phone]   REASON FOR VISIT:  Follow-up for right lung mass and mediastinal lymphadenopathy  PRIOR THERAPY: 1.  4 cycles of carboplatin, etoposide and Atezolizumab completed on 04/29/2022  NGS Results: not done  CURRENT THERAPY: Maintenance Atezolizumab every 3 weeks  BRIEF ONCOLOGIC HISTORY:  Oncology History  Small cell lung cancer, right upper lobe ([place])  01/22/2022 Initial Diagnosis   Small cell lung cancer, right upper lobe ([place])   01/22/2022 Cancer Staging   Staging form: Lung, AJCC 8th Edition - Clinical stage from 01/22/2022: Stage IVB (cT4, cN2, pM1c) - Signed by Larry Jack, MD on 02/16/2022 Histopathologic type: Small cell carcinoma, NOS Stage prefix: Initial diagnosis   02/23/2022 - 05/25/2022 Chemotherapy   Patient is on Treatment Plan : LUNG SCLC Carboplatin + Etoposide + Atezolizumab Induction q21d / Atezolizumab Maintenance q21d     02/23/2022 -  Chemotherapy   Patient is on Treatment Plan : LUNG SCLC Carboplatin + Etoposide + Atezolizumab Induction q21d x 4 cycles / Atezolizumab Maintenance q21d       CANCER STAGING:  Cancer Staging  Small cell lung cancer, right upper lobe ([place]) Staging form: Lung, AJCC 8th Edition - Clinical stage from 01/22/2022: Stage IVB (cT4, cN2, pM1c) - Signed by Larry Jack, MD on 02/16/2022   INTERVAL HISTORY:  [name], a 72 y.o. male, seen for follow-up of small cell lung cancer and toxicity assessment prior to next cycle of Atezolizumab.  Denied any diarrhea, skin rashes, dry cough or shortness of breath.  Energy levels are reported as 75%.  Appetite has been good.   REVIEW OF SYSTEMS:  Review of Systems  Constitutional:  Negative for appetite change and fatigue.  HENT:   Negative for trouble swallowing.    Cardiovascular:  Negative for palpitations.  Gastrointestinal:  Negative for diarrhea.  All other systems reviewed and are negative.   PAST MEDICAL/SURGICAL HISTORY:  Past Medical History:  Diagnosis Date   Dyspnea    Dysrhythmia    hx a-fib   Hypertension    Insomnia    Pre-diabetes    Sleep apnea    Past Surgical History:  Procedure Laterality Date   ATRIAL FIBRILLATION ABLATION     approx 2020   CARDIAC CATHETERIZATION     HERNIA REPAIR     IR IMAGING GUIDED PORT INSERTION  02/17/2022   IR VENO/JUGULAR RIGHT  02/17/2022   VIDEO BRONCHOSCOPY WITH ENDOBRONCHIAL ULTRASOUND Bilateral 01/29/2022   Procedure: VIDEO BRONCHOSCOPY WITH ENDOBRONCHIAL ULTRASOUND;  Surgeon: Garner Nash, DO;  Location: [facility];  Service: Cardiopulmonary;  Laterality: Bilateral;  w/ Guardant [id number]    SOCIAL HISTORY:  Social History   Socioeconomic History   Marital status: Married    Spouse name: Not on file   Number of children: Not on file   Years of education: Not on file   Highest education level: Not on file  Occupational History   Occupation: Retired  Tobacco Use   Smoking status: Every Day    Types: Cigarettes    Passive exposure: Current   Smokeless tobacco: Not on file   Tobacco comments:    Pt smokes 1 ppd. 01/26/22  Vaping Use   Vaping Use: Never used  Substance and Sexual Activity   Alcohol use: Not Currently    Comment: 1 bottle wine a  day    Drug use: No   Sexual activity: Not on file  Other Topics Concern   Not on file  Social History Narrative   Not on file   Social Determinants of Health   Financial Resource Strain: Not on file  Food Insecurity: Not on file  Transportation Needs: Not on file  Physical Activity: Not on file  Stress: Not on file  Social Connections: Not on file  Intimate Partner Violence: Not on file    FAMILY HISTORY:  Family History  Problem Relation Age of Onset   Coronary artery disease Father    Diabetes Father     CURRENT  MEDICATIONS:  Current Outpatient Medications  Medication Sig Dispense Refill   allopurinol (ZYLOPRIM) 300 MG tablet Take 600 mg by mouth daily.     ALPRAZolam (XANAX) 2 MG tablet Take 1 tablet (2 mg total) by mouth at bedtime as needed for anxiety. 30 tablet 3   amoxicillin (AMOXIL) 500 MG capsule Take 500 mg by mouth every 8 (eight) hours.     Atezolizumab (TECENTRIQ IV) Inject into the vein every 21 ( twenty-one) days.     furosemide (LASIX) 80 MG tablet Take 80 mg by mouth daily.     lidocaine-prilocaine (EMLA) cream Apply topically as directed.     metolazone (ZAROXOLYN) 2.5 MG tablet Take 2.5 mg by mouth daily.     potassium chloride (KLOR-CON M) 10 MEQ tablet Take 1 tablet (10 mEq total) by mouth 2 (two) times daily. 180 tablet 3   sotalol (BETAPACE) 80 MG tablet SMARTSIG:1 Tablet(s) By Mouth Every 12 Hours     Vitamin D, Ergocalciferol, (DRISDOL) 1.25 MG (50000 UNIT) CAPS capsule Take 50,000 Units by mouth daily.     No current facility-administered medications for this visit.    ALLERGIES:  No Known Allergies  PHYSICAL EXAM:  Performance status (ECOG): 1 - Symptomatic but completely ambulatory  There were no vitals filed for this visit.  Wt Readings from Last 3 Encounters:  11/10/22 239 lb (108.4 kg)  09/29/22 243 lb 3.2 oz (110.3 kg)  09/08/22 236 lb 5.3 oz (107.2 kg)   Physical Exam Vitals reviewed.  Constitutional:      Appearance: Normal appearance.  Cardiovascular:     Rate and Rhythm: Normal rate and regular rhythm.     Pulses: Normal pulses.     Heart sounds: Normal heart sounds.  Pulmonary:     Effort: Pulmonary effort is normal.     Breath sounds: Normal breath sounds.  Neurological:     General: No focal deficit present.     Mental Status: He is alert and oriented to person, place, and time.  Psychiatric:        Mood and Affect: Mood normal.        Behavior: Behavior normal.     LABORATORY DATA:  I have reviewed the labs as listed.     Latest Ref  Rng & Units 11/10/2022   11:35 AM 10/20/2022   12:13 PM 09/29/2022    9:58 AM  CBC  WBC 4.0 - 10.5 K/uL 5.7  6.4  7.4   Hemoglobin 13.0 - 17.0 g/dL 15.4  15.5  14.8   Hematocrit 39.0 - 52.0 % 46.5  47.0  45.1   Platelets 150 - 400 K/uL 179  195  173       Latest Ref Rng & Units 11/10/2022   11:35 AM 10/20/2022   12:13 PM 09/29/2022    9:58 AM  CMP  Glucose 70 - 99 mg/dL 100  108  108   BUN 8 - 23 mg/dL 17  15  10    Creatinine 0.61 - 1.24 mg/dL 0.97  0.84  0.73   Sodium 135 - 145 mmol/L 138  139  137   Potassium 3.5 - 5.1 mmol/L 3.8  3.5  3.8   Chloride 98 - 111 mmol/L 100  100  102   CO2 22 - 32 mmol/L 29  30  29    Calcium 8.9 - 10.3 mg/dL 7.9  8.2  8.0   Total Protein 6.5 - 8.1 g/dL 6.7  6.7  6.7   Total Bilirubin 0.3 - 1.2 mg/dL 0.9  1.0  1.3   Alkaline Phos 38 - 126 U/L 76  73  69   AST 15 - 41 U/L 12  14  13    ALT 0 - 44 U/L 10  9  10      DIAGNOSTIC IMAGING:  I have independently reviewed the scans and discussed with the patient. MR Brain W Wo Contrast  Result Date: 11/03/2022 CLINICAL DATA:  Small cell lung cancer. EXAM: MRI HEAD WITHOUT AND WITH CONTRAST TECHNIQUE: Multiplanar, multiecho pulse sequences of the brain and surrounding structures were obtained without and with intravenous contrast. CONTRAST:  86mL GADAVIST GADOBUTROL 1 MMOL/ML IV SOLN COMPARISON:  Head without and with contrast 08/10/2022 FINDINGS: Brain: No acute infarct, hemorrhage, or mass lesion is present. No significant white matter lesions are present. Brain volume is normal for age. Remote lacunar infarct is again noted within the posterior right lentiform nucleus. Basal ganglia are otherwise within normal limits. The internal auditory canals are within normal limits. Remote lacunar infarct of the right pons is stable. Brainstem and cerebellum are otherwise normal. Postcontrast images demonstrate no pathologic enhancement. Midline structures are within normal limits. Vascular: Flow is present in the major  intracranial arteries. Dural sinuses demonstrate normal enhancement. Skull and upper cervical spine: The craniocervical junction is normal. Upper cervical spine is within normal limits. Marrow signal is unremarkable. Sinuses/Orbits: Scattered opacification of anterior ethmoid air cells is similar the prior study. Mild circumferential mucosal thickening present in the maxillary sinuses and frontal sinuses bilaterally. The sphenoid sinuses are clear. Small mastoid effusions are present. No obstructing nasopharyngeal lesion is present. Bilateral lens replacements are noted. Globes and orbits are otherwise unremarkable. IMPRESSION: 1. No evidence for metastatic disease to the brain. 2. 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 3. Chronic sinus disease is stable. Electronically Signed   By: San Morelle M.D.   On: 11/03/2022 08:50   CT CHEST ABDOMEN PELVIS W CONTRAST  Result Date: 11/02/2022 CLINICAL DATA:  Monitoring right lung mass and mediastinal lymphadenopathy, small cell lung cancer. * Tracking Code: BO * EXAM: CT CHEST, ABDOMEN, AND PELVIS WITH CONTRAST TECHNIQUE: Multidetector CT imaging of the chest, abdomen and pelvis was performed following the standard protocol during bolus administration of intravenous contrast. RADIATION DOSE REDUCTION: This exam was performed according to the departmental dose-optimization program which includes automated exposure control, adjustment of the mA and/or kV according to patient size and/or use of iterative reconstruction technique. CONTRAST:  175mL OMNIPAQUE IOHEXOL 300 MG/ML  SOLN COMPARISON:  08/10/2022 and older FINDINGS: CT CHEST FINDINGS Cardiovascular: Left upper chest port which is accessed. Heart is nonenlarged. There is small to moderate pericardial effusion which is similar to the prior examination. Prominent coronary artery calcifications. The thoracic aorta has a normal course and caliber with scattered  atherosclerotic  calcified plaque. Mediastinum/Nodes: No specific abnormal lymph node enlargement identified in the axillary region, left hilum. There are some small nodes in the mediastinum which are not pathologic by size criteria, more numerous than usually seen but unchanged from previous. Prominent right hilar node which measured 17 mm in short axis on the previous exam, today appears smaller measuring 12 by 12 mm when measured in the same fashion as the prior. Cephalocaudal length on coronal on the previous would have measured 10 mm and today on series 4, image 104 8 mm. The subcarinal node which previously was measured at 9 mm in short axis, today when measured in the same fashion on series 2, image 30 measures 9 mm in short axis and longitudinal 15 mm. Normal caliber thoracic esophagus. Lungs/Pleura: Left lung is without consolidation, pneumothorax or effusion. There are some dependent atelectasis at the left lung base. Calcified nodule medial left upper lobe on image 48 of series 3. The right lung has a small right pleural effusion which is increased from previous. There is some adjacent mild bandlike opacity in the right lower lobe. Mild thickening along the posterior aspect right upper lobe abutting the major fissure stable when adjusting for technique such as images 62 through 66 of series 3. Improving superior segment right lower lobe opacity seen on the prior. Musculoskeletal: Scattered degenerative changes along the spine. The nodular areas soft tissue paraspinal along the lower thoracic spine are stable when adjusted for technique. 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 Recommend continued attention on follow-up. CT ABDOMEN PELVIS FINDINGS Hepatobiliary: There are some scattered tiny benign-appearing hepatic cysts which are unchanged from prior. No separate new space-occupying liver lesion identified. Patent portal vein. The gallbladder is nondilated. Pancreas:  Preserved pancreatic parenchyma without enhancing mass. Only mild global atrophy. Spleen: Spleen is nonenlarged with homogeneous enhancement. Adrenals/Urinary Tract: Bilateral areas of adrenal nodularity are again seen. Left on series 2, image 60 measures 14 by 10 mm and right on image 57 of series 2 measures 11 by 10 mm. Both of these are stable. Going back to previous examinations noncontrast such as the PET-CT scan of 02/05/2022 these are likely adenomas. Preserved renal parenchyma without enhancing mass or collecting system dilatation. Few tiny left-sided subcentimeter cystic foci, likely Bosniak 1 lesions. No specific imaging follow-up. The ureters have normal course and caliber down to the bladder. Preserved contours of the underdistended urinary bladder. Of note there is some contrast in the renal collecting systems. Please correlate with any previous contrast administration. Patient did have an MRI of the head as well from the same day. Stomach/Bowel: Large bowel has a normal course and caliber on this non oral contrast exam. Scattered stool. Left-sided colonic diverticula scattered. Normal retrocecal appendix. The stomach and small bowel are nondilated. Vascular/Lymphatic: Normal caliber aorta and IVC with scattered vascular calcifications. No specific abnormal lymph node enlargement identified in the abdomen and pelvis. Reproductive: Prostate is unremarkable. Other: Small fat containing inguinal hernias.  No ascites. Musculoskeletal: Curvature of the lumbar spine. Scattered degenerative changes seen of the spine and pelvis. This includes along the sacroiliac joints. There is some air along the margin of the right sacroiliac joint, likely degenerative. IMPRESSION: Persistent right-sided perihilar soft tissue thickening and prominent adjacent lymph node. Hilar node appears slightly smaller today. No new mass lesion, lymph node enlargement. New very small right-sided pleural effusion. Recommend follow-up and  correlation with any specific symptoms. There is subtle opacity along the superior segment of the right lower lobe  on the prior is improved. Stable pericardial effusion. Adrenal adenomas. Colonic diverticulosis. Stable paraspinal soft tissue seen again along the lower thoracic spine. Recommend continued surveillance. Aortic Atherosclerosis (ICD10-I70.0). Electronically Signed   By: Jill Side M.D.   On: 11/02/2022 14:41     ASSESSMENT:  Extensive stage small cell lung cancer: - Productive cough for 3 weeks with hemoptysis.  Hemoptysis improved after Eliquis stopped 2 weeks ago.  Still has some purulent sputum.  Currently on Levaquin. - Abnormal chest x-ray on 01/19/2022 for productive cough - CT chest with contrast (01/21/2022): Right hilar mass measuring 6.5 x 5.2 cm surrounding and compressing the right upper lobe bronchus, bronchus intermedius, right middle lobe bronchus and right lower lobe bronchus centrally.  Compression of the right lower lobe pulmonary artery.  Nodular soft tissue lesion in the right lower lobe continuous with right hilar mass and could represent endobronchial spread.  Enlarged mediastinal lymph nodes but no findings of pulmonary metastatic disease.  Indeterminate bilateral adrenal gland nodules and low attenuation liver lesions, likely benign cyst. - No weight loss.  No chest pains. - PET scan: Right hilar mass involving upper lobe, middle lobe and lower lobe with satellite nodularity in the right lower lobe, hypermetabolic subcarinal and right paratracheal lymphadenopathy.  Pleural-based nodule along the right posterior costophrenic angle.  Hypermetabolic left adrenal lesion.  Small hypermetabolic lesions in the humerus bilaterally and a subcutaneous focus in the right posterior lower back. - Right lung hilar mass biopsy (01/29/2022): Small cell carcinoma -4 cycles of carboplatin, etoposide and atezolizumab from 02/23/2022 through 04/29/2022, maintenance Atezolizumab ongoing     Social/family history: - He lives at home with his wife Larry Moore.  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  Retired 8 years ago.  Smoked 1 pack/day for 50 years. - Father had renal cell carcinoma.   PLAN:  Extensive stage small cell lung cancer: - He is tolerating Atezolizumab without any immunotherapy related side effects. - CT CAP (11/02/2022): No evidence of recurrence or metastatic disease.  New very small right-sided pleural effusion.  Stable pericardial effusion. - Reviewed labs today which showed normal LFTs and CBC.  TSH is 2.6. - Continue treatment today and in 3 weeks.  RTC 6 weeks for follow-up.  Will closely monitor right pleural effusion.  2.  Difficulty sleeping: - He is reportedly taking some THC supplements which are helping.  3.  Hypokalemia: - Continue potassium supplements daily.  Potassium is normal today.   Orders placed this encounter:  No orders of the defined types were placed in this encounter.     Larry Jack, MD [place] [phone]

## 2022-11-14 ENCOUNTER — Other Ambulatory Visit: Payer: Self-pay

## 2022-11-15 ENCOUNTER — Other Ambulatory Visit: Payer: Self-pay

## 2022-12-01 ENCOUNTER — Other Ambulatory Visit: Payer: PPO

## 2022-12-01 ENCOUNTER — Inpatient Hospital Stay: Payer: PPO

## 2022-12-01 ENCOUNTER — Ambulatory Visit: Payer: PPO | Admitting: Hematology

## 2022-12-01 VITALS — BP 106/71 | HR 75 | Temp 98.7°F | Resp 18

## 2022-12-01 VITALS — BP 116/73 | HR 75 | Temp 97.7°F | Resp 18 | Wt 232.7 lb

## 2022-12-01 DIAGNOSIS — Z95828 Presence of other vascular implants and grafts: Secondary | ICD-10-CM

## 2022-12-01 DIAGNOSIS — C3411 Malignant neoplasm of upper lobe, right bronchus or lung: Secondary | ICD-10-CM

## 2022-12-01 DIAGNOSIS — Z5112 Encounter for antineoplastic immunotherapy: Secondary | ICD-10-CM | POA: Diagnosis not present

## 2022-12-01 LAB — COMPREHENSIVE METABOLIC PANEL
ALT: 9 U/L (ref 0–44)
AST: 13 U/L — ABNORMAL LOW (ref 15–41)
Albumin: 3.8 g/dL (ref 3.5–5.0)
Alkaline Phosphatase: 83 U/L (ref 38–126)
Anion gap: 9 (ref 5–15)
BUN: 16 mg/dL (ref 8–23)
CO2: 28 mmol/L (ref 22–32)
Calcium: 8.1 mg/dL — ABNORMAL LOW (ref 8.9–10.3)
Chloride: 95 mmol/L — ABNORMAL LOW (ref 98–111)
Creatinine, Ser: 0.78 mg/dL (ref 0.61–1.24)
GFR, Estimated: >60 mL/min (ref >60)
Glucose, Bld: 110 mg/dL — ABNORMAL HIGH (ref 70–99)
Potassium: 3.5 mmol/L (ref 3.5–5.1)
Sodium: 132 mmol/L — ABNORMAL LOW (ref 135–145)
Total Bilirubin: 1 mg/dL (ref 0.3–1.2)
Total Protein: 7.3 g/dL (ref 6.5–8.1)

## 2022-12-01 LAB — CBC WITH DIFFERENTIAL/PLATELET
Abs Immature Granulocytes: 0.04 10*3/uL (ref 0.00–0.07)
Basophils Absolute: 0 10*3/uL (ref 0.0–0.1)
Basophils Relative: 0 %
Eosinophils Absolute: 0.2 10*3/uL (ref 0.0–0.5)
Eosinophils Relative: 3 %
HCT: 47.2 % (ref 39.0–52.0)
Hemoglobin: 16 g/dL (ref 13.0–17.0)
Immature Granulocytes: 1 %
Lymphocytes Relative: 12 %
Lymphs Abs: 0.9 10*3/uL (ref 0.7–4.0)
MCH: 30.8 pg (ref 26.0–34.0)
MCHC: 33.9 g/dL (ref 30.0–36.0)
MCV: 90.9 fL (ref 80.0–100.0)
Monocytes Absolute: 0.5 10*3/uL (ref 0.1–1.0)
Monocytes Relative: 7 %
Neutro Abs: 5.4 10*3/uL (ref 1.7–7.7)
Neutrophils Relative %: 77 %
Platelets: 232 10*3/uL (ref 150–400)
RBC: 5.19 MIL/uL (ref 4.22–5.81)
RDW: 14 % (ref 11.5–15.5)
WBC: 7.1 10*3/uL (ref 4.0–10.5)
nRBC: 0 % (ref 0.0–0.2)

## 2022-12-01 LAB — MAGNESIUM: Magnesium: 1.9 mg/dL (ref 1.7–2.4)

## 2022-12-01 LAB — TSH: TSH: 0.626 u[IU]/mL (ref 0.350–4.500)

## 2022-12-01 MED ORDER — HEPARIN SOD (PORK) LOCK FLUSH 100 UNIT/ML IV SOLN
500.0000 [IU] | Freq: Once | INTRAVENOUS | Status: AC | PRN
  Administered 2022-12-01: 500 [IU]

## 2022-12-01 MED ORDER — SODIUM CHLORIDE 0.9 % IV SOLN
1200.0000 mg | Freq: Once | INTRAVENOUS | Status: AC
  Administered 2022-12-01: 1200 mg via INTRAVENOUS
  Filled 2022-12-01: qty 20

## 2022-12-01 MED ORDER — SODIUM CHLORIDE 0.9% FLUSH
10.0000 mL | Freq: Once | INTRAVENOUS | Status: AC
  Administered 2022-12-01: 10 mL via INTRAVENOUS

## 2022-12-01 MED ORDER — SODIUM CHLORIDE 0.9% FLUSH
10.0000 mL | INTRAVENOUS | Status: DC | PRN
  Administered 2022-12-01: 10 mL

## 2022-12-01 MED ORDER — SODIUM CHLORIDE 0.9 % IV SOLN: Freq: Once | INTRAVENOUS | Status: AC

## 2022-12-01 NOTE — Patient Instructions (Signed)
[facility]  Discharge Instructions: Thank you for choosing [facility] to provide your oncology and hematology care.  If you have a lab appointment with the [facility], please come in thru the Main Entrance and check in at the main information desk.  Wear comfortable clothing and clothing appropriate for easy access to any Portacath or PICC line.   We strive to give you quality time with your provider. You may need to reschedule your appointment if you arrive late (15 or more minutes).  Arriving late affects you and other patients whose appointments are after yours.  Also, if you miss three or more appointments without notifying the office, you may be dismissed from the clinic at the provider's discretion.      For prescription refill requests, have your pharmacy contact our office and allow 72 hours for refills to be completed.    Today you received the following chemotherapy and/or immunotherapy agents Tecentriq   To help prevent nausea and vomiting after your treatment, we encourage you to take your nausea medication as directed.  Atezolizumab Injection What is this medication? ATEZOLIZUMAB (a te zoe LIZ ue mab) treats some types of cancer. It works by helping your immune system slow or stop the spread of cancer cells. It is a monoclonal antibody. This medicine may be used for other purposes; ask your health care provider or pharmacist if you have questions. COMMON BRAND NAME(S): Tecentriq What should I tell my care team before I take this medication? They need to know if you have any of these conditions: Allogeneic stem cell transplant (uses someone else's stem cells) Autoimmune diseases, such as Crohn disease, ulcerative colitis, lupus History of chest radiation Nervous system problems, such as Guillain-Barre syndrome, myasthenia gravis Organ transplant An unusual or allergic reaction to atezolizumab, other medications, foods, dyes, or  preservatives Pregnant or trying to get pregnant Breast-feeding How should I use this medication? This medication is injected into a vein. It is given by your care team in a hospital or clinic setting. A special MedGuide will be given to you before each treatment. Be sure to read this information carefully each time. Talk to your care team about the use of this medication in children. While it may be prescribed for children as young as 2 years for selected conditions, precautions do apply. Overdosage: If you think you have taken too much of this medicine contact a poison control center or emergency room at once. NOTE: This medicine is only for you. Do not share this medicine with others. What if I miss a dose? Keep appointments for follow-up doses. It is important not to miss your dose. Call your care team if you are unable to keep an appointment. What may interact with this medication? Interactions have not been studied. This list may not describe all possible interactions. Give your health care provider a list of all the medicines, herbs, non-prescription drugs, or dietary supplements you use. Also tell them if you smoke, drink alcohol, or use illegal drugs. Some items may interact with your medicine. What should I watch for while using this medication? Your condition will be monitored carefully while you are receiving this medication. You may need blood work while taking this medication. This medication may cause serious skin reactions. They can happen weeks to months after starting the medication. Contact your care team right away if you notice fevers or flu-like symptoms with a rash. The rash may be red or purple and then turn  into blisters or peeling of the skin. You may also notice a red rash with swelling of the face, lips, or lymph nodes in your neck or under your arms. Tell your care team right away if you have any change in your eyesight. Talk to your care team if you may be pregnant.  Serious birth defects can occur if you take this medication during pregnancy and for 5 months after the last dose. You will need a negative pregnancy test before starting this medication. Contraception is recommended while taking this medication and for 5 months after the last dose. Your care team can help you find the option that works for you. Do not breastfeed while taking this medication and for at least 5 months after the last dose. What side effects may I notice from receiving this medication? Side effects that you should report to your doctor or health care professional as soon as possible: Allergic reactions--skin rash, itching, hives, swelling of the face, lips, tongue, or throat Dry cough, shortness of breath or trouble breathing Eye pain, redness, irritation, or discharge with blurry or decreased vision Heart muscle inflammation--unusual weakness or fatigue, shortness of breath, chest pain, fast or irregular heartbeat, dizziness, swelling of the ankles, feet, or hands Hormone gland problems--headache, sensitivity to light, unusual weakness or fatigue, dizziness, fast or irregular heartbeat, increased sensitivity to cold or heat, excessive sweating, constipation, hair loss, increased thirst or amount of urine, tremors or shaking, irritability Infusion reactions--chest pain, shortness of breath or trouble breathing, feeling faint or lightheaded Kidney injury (glomerulonephritis)--decrease in the amount of urine, red or dark brown urine, foamy or bubbly urine, swelling of the ankles, hands, or feet Liver injury--right upper belly pain, loss of appetite, nausea, light-colored stool, dark yellow or brown urine, yellowing skin or eyes, unusual weakness or fatigue Pain, tingling, or numbness in the hands or feet, muscle weakness, change in vision, confusion or trouble speaking, loss of balance or coordination, trouble walking, seizures Rash, fever, and swollen lymph nodes Redness, blistering,  peeling, or loosening of the skin, including inside the mouth Sudden or severe stomach pain, bloody diarrhea, fever, nausea, vomiting Side effects that usually do not require medical attention (report to your doctor or health care professional if they continue or are bothersome): Bone, joint, or muscle pain Diarrhea Fatigue Loss of appetite Nausea Skin rash This list may not describe all possible side effects. Call your doctor for medical advice about side effects. You may report side effects to FDA at 1-800-FDA-1088. Where should I keep my medication? This medication is given in a hospital or clinic. It will not be stored at home. NOTE: This sheet is a summary. It may not cover all possible information. If you have questions about this medicine, talk to your doctor, pharmacist, or health care provider.  2023 Elsevier/Gold Standard (2022-01-12 00:00:00)   BELOW ARE SYMPTOMS THAT SHOULD BE REPORTED IMMEDIATELY: *FEVER GREATER THAN 100.4 F (38 C) OR HIGHER *CHILLS OR SWEATING *NAUSEA AND VOMITING THAT IS NOT CONTROLLED WITH YOUR NAUSEA MEDICATION *UNUSUAL SHORTNESS OF BREATH *UNUSUAL BRUISING OR BLEEDING *URINARY PROBLEMS (pain or burning when urinating, or frequent urination) *BOWEL PROBLEMS (unusual diarrhea, constipation, pain near the anus) TENDERNESS IN MOUTH AND THROAT WITH OR WITHOUT PRESENCE OF ULCERS (sore throat, sores in mouth, or a toothache) UNUSUAL RASH, SWELLING OR PAIN  UNUSUAL VAGINAL DISCHARGE OR ITCHING   Items with * indicate a potential emergency and should be followed up as soon as possible or go to the Emergency Department if  any problems should occur.  Please show the CHEMOTHERAPY ALERT CARD or IMMUNOTHERAPY ALERT CARD at check-in to the Emergency Department and triage nurse.  Should you have questions after your visit or need to cancel or reschedule your appointment, please contact [facility] [phone]  and follow the prompts.  Office  hours are 8:00 a.m. to 4:30 p.m. Monday - Friday. Please note that voicemails left after 4:00 p.m. may not be returned until the following business day.  We are closed weekends and major holidays. You have access to a nurse at all times for urgent questions. Please call the main number to the clinic [phone] and follow the prompts.  For any non-urgent questions, you may also contact your provider using MyChart. We now offer e-Visits for anyone 21 and older to request care online for non-urgent symptoms. For details visit [email or web address].   Also download the MyChart app! Go to the app store, search "MyChart", open the app, select [facility], and log in with your MyChart username and password.

## 2022-12-01 NOTE — Progress Notes (Signed)
Pt presents today for Tecentriq per provider's order. Vital signs and labs WNL for treatment. Okay to proceed with treatment today.  Tecentriq given today per MD orders. Tolerated infusion without adverse affects. Vital signs stable. No complaints at this time. Discharged from clinic ambulatory in stable condition. Alert and oriented x 3. F/U with  [facility] as scheduled.   

## 2022-12-04 ENCOUNTER — Other Ambulatory Visit: Payer: Self-pay

## 2022-12-14 ENCOUNTER — Telehealth: Payer: Self-pay | Admitting: *Deleted

## 2022-12-14 NOTE — Progress Notes (Signed)
  Care Coordination   Note   12/14/2022 Name: TRIPP GOINS MRN: [id number] DOB: Jul 20, 1951  AZARION HOVE is a 72 y.o. year old male who sees [name], MD for primary care. I reached out to Leonides Grills by phone today to offer care coordination services.  Mr. Ausborn was given information about Care Coordination services today including:   The Care Coordination services include support from the care team which includes your Nurse Coordinator, Clinical Social Worker, or Pharmacist.  The Care Coordination team is here to help remove barriers to the health concerns and goals most important to you. Care Coordination services are voluntary, and the patient may decline or stop services at any time by request to their care team member.   Care Coordination Consent Status: Patient agreed to services and verbal consent obtained.   Follow up plan:  Telephone appointment with care coordination team member scheduled for:  12/16/2022  Encounter Outcome:  Pt. Scheduled  Julian Hy, [place] Direct Dial: [phone]

## 2022-12-16 ENCOUNTER — Ambulatory Visit: Payer: Self-pay | Admitting: *Deleted

## 2022-12-16 ENCOUNTER — Encounter: Payer: Self-pay | Admitting: *Deleted

## 2022-12-16 NOTE — Patient Outreach (Signed)
  Care Coordination   Initial Visit Note   12/16/2022 Name: Larry Moore MRN: [id number] DOB: 01-23-1951  Larry Moore is a 72 y.o. year old male who sees Redmond School, MD for primary care. I spoke with  Leonides Grills by phone today.  What matters to the patients health and wellness today?  Ongoing self management of chronic medical conditions    Goals Addressed             This Visit's Progress    COMPLETED: Care Coordination Services (no follow-up needed)       Care Coordination Goals: Patient will follow-up with PCP and/or specialist(s) as recommended Patient will take medications as prescribed Patient will reach out to [facility] at [phone] with any care coordination or resource needs  Patient will utilize the [facility] 24 Hour Nurse/Concierge Line as needed [phone]         SDOH assessments and interventions completed:  Yes  SDOH Interventions Today    Flowsheet Row Most Recent Value  SDOH Interventions   Food Insecurity Interventions Intervention Not Indicated  Housing Interventions Intervention Not Indicated  Transportation Interventions Intervention Not Indicated  Utilities Interventions Intervention Not Indicated  Financial Strain Interventions Intervention Not Indicated        Care Coordination Interventions:  Yes, provided  Interventions Today    Flowsheet Row Most Recent Value  Chronic Disease   Chronic disease during today's visit Other, Hypertension (HTN), Congestive Heart Failure (CHF)  [Small Cell Lung Cancer]  General Interventions   General Interventions Discussed/Reviewed General Interventions Discussed, Doctor Visits  [Provided information on [facility] Care Management services. 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 He appreciated the outreach but doesn't feel services are needed at this time.]  Doctor Visits Discussed/Reviewed Doctor Visits Reviewed, PCP, Specialist  PCP/Specialist  Visits Compliance with follow-up visit  [Dr Delton Coombes for management of small cell lung cancer and Dr Gerarda Fraction for PCP services]       Follow up plan: No further intervention required.   Encounter Outcome:  Pt. Visit Completed   Chong Sicilian, BSN, RN-BC RN Care Coordinator [facility] Direct Dial: [phone] Main #: [phone]

## 2022-12-17 DIAGNOSIS — B351 Tinea unguium: Secondary | ICD-10-CM | POA: Diagnosis not present

## 2022-12-22 ENCOUNTER — Other Ambulatory Visit: Payer: PPO

## 2022-12-22 ENCOUNTER — Ambulatory Visit: Payer: PPO | Admitting: Hematology

## 2022-12-22 ENCOUNTER — Ambulatory Visit: Payer: PPO

## 2022-12-24 ENCOUNTER — Inpatient Hospital Stay (HOSPITAL_BASED_OUTPATIENT_CLINIC_OR_DEPARTMENT_OTHER): Payer: PPO | Admitting: Hematology

## 2022-12-24 ENCOUNTER — Inpatient Hospital Stay: Payer: PPO

## 2022-12-24 ENCOUNTER — Inpatient Hospital Stay: Payer: PPO | Attending: Hematology

## 2022-12-24 VITALS — BP 110/56 | HR 73 | Temp 97.8°F | Resp 18

## 2022-12-24 DIAGNOSIS — C3411 Malignant neoplasm of upper lobe, right bronchus or lung: Secondary | ICD-10-CM

## 2022-12-24 DIAGNOSIS — I10 Essential (primary) hypertension: Secondary | ICD-10-CM | POA: Diagnosis not present

## 2022-12-24 DIAGNOSIS — E279 Disorder of adrenal gland, unspecified: Secondary | ICD-10-CM | POA: Diagnosis not present

## 2022-12-24 DIAGNOSIS — F17210 Nicotine dependence, cigarettes, uncomplicated: Secondary | ICD-10-CM | POA: Insufficient documentation

## 2022-12-24 DIAGNOSIS — E876 Hypokalemia: Secondary | ICD-10-CM | POA: Insufficient documentation

## 2022-12-24 DIAGNOSIS — Z5112 Encounter for antineoplastic immunotherapy: Secondary | ICD-10-CM | POA: Insufficient documentation

## 2022-12-24 DIAGNOSIS — Z79899 Other long term (current) drug therapy: Secondary | ICD-10-CM | POA: Insufficient documentation

## 2022-12-24 LAB — COMPREHENSIVE METABOLIC PANEL
ALT: 10 U/L (ref 0–44)
AST: 12 U/L — ABNORMAL LOW (ref 15–41)
Albumin: 3.6 g/dL (ref 3.5–5.0)
Alkaline Phosphatase: 71 U/L (ref 38–126)
Anion gap: 9 (ref 5–15)
BUN: 12 mg/dL (ref 8–23)
CO2: 30 mmol/L (ref 22–32)
Calcium: 7.9 mg/dL — ABNORMAL LOW (ref 8.9–10.3)
Chloride: 97 mmol/L — ABNORMAL LOW (ref 98–111)
Creatinine, Ser: 0.81 mg/dL (ref 0.61–1.24)
GFR, Estimated: >60 mL/min (ref >60)
Glucose, Bld: 115 mg/dL — ABNORMAL HIGH (ref 70–99)
Potassium: 3.2 mmol/L — ABNORMAL LOW (ref 3.5–5.1)
Sodium: 136 mmol/L (ref 135–145)
Total Bilirubin: 1.2 mg/dL (ref 0.3–1.2)
Total Protein: 6.6 g/dL (ref 6.5–8.1)

## 2022-12-24 LAB — CBC WITH DIFFERENTIAL/PLATELET
Abs Immature Granulocytes: 0.03 10*3/uL (ref 0.00–0.07)
Basophils Absolute: 0 10*3/uL (ref 0.0–0.1)
Basophils Relative: 0 %
Eosinophils Absolute: 0.2 10*3/uL (ref 0.0–0.5)
Eosinophils Relative: 3 %
HCT: 44.2 % (ref 39.0–52.0)
Hemoglobin: 15.1 g/dL (ref 13.0–17.0)
Immature Granulocytes: 1 %
Lymphocytes Relative: 12 %
Lymphs Abs: 0.8 10*3/uL (ref 0.7–4.0)
MCH: 31 pg (ref 26.0–34.0)
MCHC: 34.2 g/dL (ref 30.0–36.0)
MCV: 90.8 fL (ref 80.0–100.0)
Monocytes Absolute: 0.5 10*3/uL (ref 0.1–1.0)
Monocytes Relative: 8 %
Neutro Abs: 5 10*3/uL (ref 1.7–7.7)
Neutrophils Relative %: 76 %
Platelets: 208 10*3/uL (ref 150–400)
RBC: 4.87 MIL/uL (ref 4.22–5.81)
RDW: 14.4 % (ref 11.5–15.5)
WBC: 6.5 10*3/uL (ref 4.0–10.5)
nRBC: 0 % (ref 0.0–0.2)

## 2022-12-24 LAB — TSH: TSH: 0.653 u[IU]/mL (ref 0.350–4.500)

## 2022-12-24 LAB — MAGNESIUM: Magnesium: 1.9 mg/dL (ref 1.7–2.4)

## 2022-12-24 MED ORDER — HEPARIN SOD (PORK) LOCK FLUSH 100 UNIT/ML IV SOLN
500.0000 [IU] | Freq: Once | INTRAVENOUS | Status: AC | PRN
  Administered 2022-12-24: 500 [IU]

## 2022-12-24 MED ORDER — SODIUM CHLORIDE 0.9% FLUSH
10.0000 mL | INTRAVENOUS | Status: DC | PRN
  Administered 2022-12-24: 10 mL

## 2022-12-24 MED ORDER — SODIUM CHLORIDE 0.9% FLUSH
10.0000 mL | INTRAVENOUS | Status: DC | PRN
  Administered 2022-12-24: 10 mL via INTRAVENOUS

## 2022-12-24 MED ORDER — SODIUM CHLORIDE 0.9 % IV SOLN: Freq: Once | INTRAVENOUS | Status: AC

## 2022-12-24 MED ORDER — SODIUM CHLORIDE 0.9 % IV SOLN
1200.0000 mg | Freq: Once | INTRAVENOUS | Status: AC
  Administered 2022-12-24: 1200 mg via INTRAVENOUS
  Filled 2022-12-24: qty 20

## 2022-12-24 NOTE — Patient Instructions (Signed)
[facility]  Discharge Instructions: Thank you for choosing [facility] to provide your oncology and hematology care.  If you have a lab appointment with the [facility], please come in thru the Main Entrance and check in at the main information desk.  Wear comfortable clothing and clothing appropriate for easy access to any Portacath or PICC line.   We strive to give you quality time with your provider. You may need to reschedule your appointment if you arrive late (15 or more minutes).  Arriving late affects you and other patients whose appointments are after yours.  Also, if you miss three or more appointments without notifying the office, you may be dismissed from the clinic at the provider's discretion.      For prescription refill requests, have your pharmacy contact our office and allow 72 hours for refills to be completed.    Today you received the following chemotherapy and/or immunotherapy agents Tecentriq.  Atezolizumab Injection What is this medication? ATEZOLIZUMAB (a te zoe LIZ ue mab) treats some types of cancer. It works by helping your immune system slow or stop the spread of cancer cells. It is a monoclonal antibody. This medicine may be used for other purposes; ask your health care provider or pharmacist if you have questions. COMMON BRAND NAME(S): Tecentriq What should I tell my care team before I take this medication? They need to know if you have any of these conditions: Allogeneic stem cell transplant (uses someone else's stem cells) Autoimmune diseases, such as Crohn disease, ulcerative colitis, lupus History of chest radiation Nervous system problems, such as Guillain-Barre syndrome, myasthenia gravis Organ transplant An unusual or allergic reaction to atezolizumab, other medications, foods, dyes, or preservatives Pregnant or trying to get pregnant Breast-feeding How should I use this medication? This medication is injected  into a vein. It is given by your care team in a hospital or clinic setting. A special MedGuide will be given to you before each treatment. Be sure to read this information carefully each time. Talk to your care team about the use of this medication in children. While it may be prescribed for children as young as 2 years for selected conditions, precautions do apply. Overdosage: If you think you have taken too much of this medicine contact a poison control center or emergency room at once. NOTE: This medicine is only for you. Do not share this medicine with others. What if I miss a dose? Keep appointments for follow-up doses. It is important not to miss your dose. Call your care team if you are unable to keep an appointment. What may interact with this medication? Interactions have not been studied. This list may not describe all possible interactions. Give your health care provider a list of all the medicines, herbs, non-prescription drugs, or dietary supplements you use. Also tell them if you smoke, drink alcohol, or use illegal drugs. Some items may interact with your medicine. What should I watch for while using this medication? Your condition will be monitored carefully while you are receiving this medication. You may need blood work while taking this medication. This medication may cause serious skin reactions. They can happen weeks to months after starting the medication. Contact your care team right away if you notice fevers or flu-like symptoms with a rash. The rash may be red or purple and then turn into blisters or peeling of the skin. You may also notice a red rash with swelling of the face, lips, or  lymph nodes in your neck or under your arms. Tell your care team right away if you have any change in your eyesight. Talk to your care team if you may be pregnant. Serious birth defects can occur if you take this medication during pregnancy and for 5 months after the last dose. You will need a  negative pregnancy test before starting this medication. Contraception is recommended while taking this medication and for 5 months after the last dose. Your care team can help you find the option that works for you. Do not breastfeed while taking this medication and for at least 5 months after the last dose. What side effects may I notice from receiving this medication? Side effects that you should report to your doctor or health care professional as soon as possible: Allergic reactions--skin rash, itching, hives, swelling of the face, lips, tongue, or throat Dry cough, shortness of breath or trouble breathing Eye pain, redness, irritation, or discharge with blurry or decreased vision Heart muscle inflammation--unusual weakness or fatigue, shortness of breath, chest pain, fast or irregular heartbeat, dizziness, swelling of the ankles, feet, or hands Hormone gland problems--headache, sensitivity to light, unusual weakness or fatigue, dizziness, fast or irregular heartbeat, increased sensitivity to cold or heat, excessive sweating, constipation, hair loss, increased thirst or amount of urine, tremors or shaking, irritability Infusion reactions--chest pain, shortness of breath or trouble breathing, feeling faint or lightheaded Kidney injury (glomerulonephritis)--decrease in the amount of urine, red or dark brown urine, foamy or bubbly urine, swelling of the ankles, hands, or feet Liver injury--right upper belly pain, loss of appetite, nausea, light-colored stool, dark yellow or brown urine, yellowing skin or eyes, unusual weakness or fatigue Pain, tingling, or numbness in the hands or feet, muscle weakness, change in vision, confusion or trouble speaking, loss of balance or coordination, trouble walking, seizures Rash, fever, and swollen lymph nodes Redness, blistering, peeling, or loosening of the skin, including inside the mouth Sudden or severe stomach pain, bloody diarrhea, fever, nausea,  vomiting Side effects that usually do not require medical attention (report to your doctor or health care professional if they continue or are bothersome): Bone, joint, or muscle pain Diarrhea Fatigue Loss of appetite Nausea Skin rash This list may not describe all possible side effects. Call your doctor for medical advice about side effects. You may report side effects to FDA at 1-800-FDA-1088. Where should I keep my medication? This medication is given in a hospital or clinic. It will not be stored at home. NOTE: This sheet is a summary. It may not cover all possible information. If you have questions about this medicine, talk to your doctor, pharmacist, or health care provider.  2023 Elsevier/Gold Standard (2022-01-12 00:00:00)       To help prevent nausea and vomiting after your treatment, we encourage you to take your nausea medication as directed.  BELOW ARE SYMPTOMS THAT SHOULD BE REPORTED IMMEDIATELY: *FEVER GREATER THAN 100.4 F (38 C) OR HIGHER *CHILLS OR SWEATING *NAUSEA AND VOMITING THAT IS NOT CONTROLLED WITH YOUR NAUSEA MEDICATION *UNUSUAL SHORTNESS OF BREATH *UNUSUAL BRUISING OR BLEEDING *URINARY PROBLEMS (pain or burning when urinating, or frequent urination) *BOWEL PROBLEMS (unusual diarrhea, constipation, pain near the anus) TENDERNESS IN MOUTH AND THROAT WITH OR WITHOUT PRESENCE OF ULCERS (sore throat, sores in mouth, or a toothache) UNUSUAL RASH, SWELLING OR PAIN  UNUSUAL VAGINAL DISCHARGE OR ITCHING   Items with * indicate a potential emergency and should be followed up as soon as possible or go to the  Emergency Department if any problems should occur.  Please show the CHEMOTHERAPY ALERT CARD or IMMUNOTHERAPY ALERT CARD at check-in to the Emergency Department and triage nurse.  Should you have questions after your visit or need to cancel or reschedule your appointment, please contact [facility] [phone]  and follow the prompts.  Office  hours are 8:00 a.m. to 4:30 p.m. Monday - Friday. Please note that voicemails left after 4:00 p.m. may not be returned until the following business day.  We are closed weekends and major holidays. You have access to a nurse at all times for urgent questions. Please call the main number to the clinic [phone] and follow the prompts.  For any non-urgent questions, you may also contact your provider using MyChart. We now offer e-Visits for anyone 37 and older to request care online for non-urgent symptoms. For details visit [email or web address].   Also download the MyChart app! Go to the app store, search "MyChart", open the app, select [facility], and log in with your MyChart username and password.

## 2022-12-24 NOTE — Progress Notes (Signed)
Patient presents today for chemotherapy infusion. Patient is in satisfactory condition with no new complaints voiced.  Vital signs are stable.  Labs reviewed by Dr. Delton Coombes during the office visit and all labs are within treatment parameters.  We will proceed with treatment per MD orders.   Patient tolerated treatment well with no complaints voiced.  Patient left ambulatory in stable condition.  Vital signs stable at discharge.  Follow up as scheduled.

## 2022-12-24 NOTE — Progress Notes (Signed)
Patient has been examined by Dr. Katragadda. Vital signs and labs have been reviewed by MD - ANC, Creatinine, LFTs, hemoglobin, and platelets are within treatment parameters per M.D. - pt may proceed with treatment.  Primary RN and pharmacy notified.  

## 2022-12-24 NOTE — Progress Notes (Signed)
[facility] [address] [place] [address]    Clinic Day:  12/24/2022  Referring physician: [name], MD  Patient Care Team: [employer]mond [employer], MD as PCP - General (Internal Medicine) [name], MD as Medical Oncologist (Medical Oncology) Brien Mates, RN as Oncology Nurse Navigator (Medical Oncology)   ASSESSMENT & PLAN:   Assessment: Extensive stage small cell lung cancer: - Productive cough for 3 weeks with hemoptysis.  Hemoptysis improved after Eliquis stopped 2 weeks ago.  Still has some purulent sputum.  Currently on Levaquin. - Abnormal chest x-ray on 01/19/2022 for productive cough - CT chest with contrast (01/21/2022): Right hilar mass measuring 6.5 x 5.2 cm surrounding and compressing the right upper lobe bronchus, bronchus intermedius, right middle lobe bronchus and right lower lobe bronchus centrally.  Compression of the right lower lobe pulmonary artery.  Nodular soft tissue lesion in the right lower lobe continuous with right hilar mass and could represent endobronchial spread.  Enlarged mediastinal lymph nodes but no findings of pulmonary metastatic disease.  Indeterminate bilateral adrenal gland nodules and low attenuation liver lesions, likely benign cyst. - No weight loss.  No chest pains. - PET scan: Right hilar mass involving upper lobe, middle lobe and lower lobe with satellite nodularity in the right lower lobe, hypermetabolic subcarinal and right paratracheal lymphadenopathy.  Pleural-based nodule along the right posterior costophrenic angle.  Hypermetabolic left adrenal lesion.  Small hypermetabolic lesions in the humerus bilaterally and a subcutaneous focus in the right posterior lower back. - Right lung hilar mass biopsy (01/29/2022): Small cell carcinoma -4 cycles of carboplatin, etoposide and atezolizumab from 02/23/2022 through 04/29/2022, maintenance Atezolizumab ongoing    Social/family history: - He lives at home  with his wife Larry Moore.  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  Retired 8 years ago.  Smoked 1 pack/day for 50 years. - Father had renal cell carcinoma.  Plan: Extensive stage small cell lung cancer: - CT CAP on 11/02/2022: No evidence of recurrence or metastatic disease.  New very small right pleural effusion.  Stable pericardial effusion. - He does not have any immunotherapy related side effects. - Complains of some fatigue.  TSH today 0.6. - Labs today shows LFTs are normal.  Creatinine is normal.  CBC was normal. - Recommend proceeding with treatment today and in 3 weeks.  RTC 6 weeks for follow-up with repeat CT CAP.  Will recommend a brain imaging every 6 months.  2.  Difficulty sleeping: - He is taking some THC supplements which are helping.  3.  Hypokalemia: - Continue potassium supplements.  Potassium is 3.2 today.  Orders Placed This Encounter  Procedures   CT CHEST ABDOMEN PELVIS W CONTRAST    Standing Status:   Future    Standing Expiration Date:   12/24/2023    Order Specific Question:   If indicated for the ordered procedure, I authorize the administration of contrast media per Radiology protocol    Answer:   Yes    Order Specific Question:   Does the patient have a contrast media/X-ray dye allergy?    Answer:   No    Order Specific Question:   Preferred imaging location?    Answer:   [facility]    Order Specific Question:   Release to patient    Answer:   Immediate    Order Specific Question:   Is Oral Contrast requested for this exam?    Answer:   Yes, Per Radiology  protocol   CBC with Differential    Standing Status:   Future    Standing Expiration Date:   02/03/2024   Comprehensive metabolic panel    Standing Status:   Future    Standing Expiration Date:   02/03/2024   CBC with Differential    Standing Status:   Future    Standing Expiration Date:   02/24/2024   Comprehensive metabolic panel    Standing Status:   Future    Standing Expiration  Date:   02/24/2024   CBC with Differential    Standing Status:   Future    Standing Expiration Date:   03/16/2024   Comprehensive metabolic panel    Standing Status:   Future    Standing Expiration Date:   03/16/2024   CBC with Differential    Standing Status:   Future    Standing Expiration Date:   04/06/2024   Comprehensive metabolic panel    Standing Status:   Future    Standing Expiration Date:   04/06/2024      I,Alexis Herring,acting as a scribe for [name], MD.,have documented all relevant documentation on the behalf of [name], MD,as directed by  [name], MD while in the presence of [name], MD.   I, [name] MD, have reviewed the above documentation for accuracy and completeness, and I agree with the above.   [name], MD   3/21/20245:49 PM  CHIEF COMPLAINT:   Diagnosis: right lung mass and mediastinal lymphadenopathy    Cancer Staging  Small cell lung cancer, right upper lobe [facility]) Staging form: Lung, AJCC 8th Edition - Clinical stage from 01/22/2022: Stage IVB (cT4, cN2, pM1c) - Signed by [name], MD on 02/16/2022    Prior Therapy: 4 cycles of carboplatin, etoposide and Atezolizumab completed on 04/29/2022   Current Therapy:  Maintenance Atezolizumab every 3 weeks    HISTORY OF PRESENT ILLNESS:   Oncology History  Small cell lung cancer, right upper lobe ([place])  01/22/2022 Initial Diagnosis   Small cell lung cancer, right upper lobe ([place])   01/22/2022 Cancer Staging   Staging form: Lung, AJCC 8th Edition - Clinical stage from 01/22/2022: Stage IVB (cT4, cN2, pM1c) - Signed by [name], MD on 02/16/2022 Histopathologic type: Small cell carcinoma, NOS Stage prefix: Initial diagnosis   02/23/2022 - 05/25/2022 Chemotherapy   Patient is on Treatment Plan : LUNG SCLC Carboplatin + Etoposide + Atezolizumab Induction q21d / Atezolizumab Maintenance q21d     02/23/2022 -   Chemotherapy   Patient is on Treatment Plan : LUNG SCLC Carboplatin + Etoposide + Atezolizumab Induction q21d x 4 cycles / Atezolizumab Maintenance q21d        INTERVAL HISTORY:   Larry Moore is a 72 y.o. male presenting to clinic today for follow up of right lung mass and mediastinal lymphadenopathy. He was last seen by me on 11/10/22.  Today, he states that he is doing well overall. His appetite level is at 70%. His energy level is at 25%. He states that he eats once a day usually as he is not typically very hungry. He denies any pain.  PAST MEDICAL HISTORY:   Past Medical History: Past Medical History:  Diagnosis Date   Dyspnea    Dysrhythmia    hx a-fib   Hypertension    Insomnia    Pre-diabetes    Sleep apnea     Surgical History: Past Surgical History:  Procedure Laterality Date   ATRIAL FIBRILLATION ABLATION  approx 2020   CARDIAC CATHETERIZATION     HERNIA REPAIR     IR IMAGING GUIDED PORT INSERTION  02/17/2022   IR VENO/JUGULAR RIGHT  02/17/2022   VIDEO BRONCHOSCOPY WITH ENDOBRONCHIAL ULTRASOUND Bilateral 01/29/2022   Procedure: VIDEO BRONCHOSCOPY WITH ENDOBRONCHIAL ULTRASOUND;  Surgeon: Garner Nash, DO;  Location: [facility];  Service: Cardiopulmonary;  Laterality: Bilateral;  w/ Guardant [id number]    Social History: Social History   Socioeconomic History   Marital status: Married    Spouse name: Not on file   Number of children: Not on file   Years of education: Not on file   Highest education level: Not on file  Occupational History   Occupation: Retired  Tobacco Use   Smoking status: Every Day    Types: Cigarettes    Passive exposure: Current   Smokeless tobacco: Not on file   Tobacco comments:    Pt smokes 1 ppd. 01/26/22  Vaping Use   Vaping Use: Never used  Substance and Sexual Activity   Alcohol use: Not Currently    Comment: 1 bottle wine a day    Drug use: No   Sexual activity: Not on file  Other Topics Concern   Not on file  Social History  Narrative   Not on file   Social Determinants of Health   Financial Resource Strain: Low Risk  (12/16/2022)   Overall Financial Resource Strain (CARDIA)    Difficulty of Paying Living Expenses: Not hard at all  Food Insecurity: No Food Insecurity (12/16/2022)   Hunger Vital Sign    Worried About Running Out of Food in the Last Year: Never true    [place] in the Last Year: Never true  Transportation Needs: No Transportation Needs (12/16/2022)   PRAPARE - Hydrologist (Medical): No    Lack of Transportation (Non-Medical): No  Physical Activity: Not on file  Stress: Not on file  Social Connections: Not on file  Intimate Partner Violence: Not on file    Family History: Family History  Problem Relation Age of Onset   Coronary artery disease Father    Diabetes Father     Current Medications:  Current Outpatient Medications:    allopurinol (ZYLOPRIM) 300 MG tablet, Take 600 mg by mouth daily., Disp: , Rfl:    ALPRAZolam (XANAX) 2 MG tablet, Take 1 tablet (2 mg total) by mouth at bedtime as needed for anxiety., Disp: 30 tablet, Rfl: 3   amoxicillin (AMOXIL) 500 MG capsule, Take 500 mg by mouth every 8 (eight) hours., Disp: , Rfl:    Atezolizumab (TECENTRIQ IV), Inject into the vein every 21 ( twenty-one) days., Disp: , Rfl:    furosemide (LASIX) 80 MG tablet, Take 80 mg by mouth daily., Disp: , Rfl:    lidocaine-prilocaine (EMLA) cream, Apply topically as directed., Disp: , Rfl:    metolazone (ZAROXOLYN) 2.5 MG tablet, Take 2.5 mg by mouth daily., Disp: , Rfl:    potassium chloride (KLOR-CON M) 10 MEQ tablet, Take 1 tablet (10 mEq total) by mouth 2 (two) times daily., Disp: 180 tablet, Rfl: 3   sotalol (BETAPACE) 80 MG tablet, SMARTSIG:1 Tablet(s) By Mouth Every 12 Hours, Disp: , Rfl:    Vitamin D, Ergocalciferol, (DRISDOL) 1.25 MG (50000 UNIT) CAPS capsule, Take 50,000 Units by mouth daily., Disp: , Rfl:  No current facility-administered medications  for this visit.  Facility-Administered Medications Ordered in Other Visits:    sodium chloride flush (NS) 0.9 %  injection 10 mL, 10 mL, Intracatheter, PRN, [name], MD, 10 mL at 12/24/22 1351   Allergies: No Known Allergies  REVIEW OF SYSTEMS:   Review of Systems  Constitutional:  Positive for appetite change and fatigue. Negative for chills and fever.  HENT:   Negative for lump/mass, mouth sores, nosebleeds, sore throat and trouble swallowing.   Eyes:  Negative for eye problems.  Respiratory:  Negative for cough and shortness of breath.   Cardiovascular:  Negative for chest pain, leg swelling and palpitations.  Gastrointestinal:  Negative for abdominal pain, constipation, diarrhea, nausea and vomiting.  Genitourinary:  Negative for bladder incontinence, difficulty urinating, dysuria, frequency, hematuria and nocturia.   Musculoskeletal:  Negative for arthralgias, back pain, flank pain, myalgias and neck pain.  Skin:  Negative for itching and rash.  Neurological:  Negative for dizziness, headaches and numbness.  Hematological:  Does not bruise/bleed easily.  Psychiatric/Behavioral:  Positive for depression. Negative for sleep disturbance and suicidal ideas. The patient is nervous/anxious.   All other systems reviewed and are negative.    VITALS:   There were no vitals taken for this visit.  Wt Readings from Last 3 Encounters:  12/24/22 234 lb 6.4 oz (106.3 kg)  12/01/22 232 lb 11.2 oz (105.6 kg)  11/10/22 239 lb (108.4 kg)    There is no height or weight on file to calculate BMI.  Performance status (ECOG): 1 - Symptomatic but completely ambulatory  PHYSICAL EXAM:   Physical Exam Vitals and nursing note reviewed. Exam conducted with a chaperone present.  Constitutional:      Appearance: Normal appearance.  Cardiovascular:     Rate and Rhythm: Normal rate and regular rhythm.     Pulses: Normal pulses.     Heart sounds: Normal heart sounds.  Pulmonary:      Effort: Pulmonary effort is normal.     Breath sounds: Normal breath sounds.  Abdominal:     Palpations: Abdomen is soft. There is no hepatomegaly, splenomegaly or mass.     Tenderness: There is no abdominal tenderness.  Musculoskeletal:     Right lower leg: No edema.     Left lower leg: No edema.  Lymphadenopathy:     Cervical: No cervical adenopathy.     Right cervical: No superficial, deep or posterior cervical adenopathy.    Left cervical: No superficial, deep or posterior cervical adenopathy.     Upper Body:     Right upper body: No supraclavicular or axillary adenopathy.     Left upper body: No supraclavicular or axillary adenopathy.  Neurological:     General: No focal deficit present.     Mental Status: He is alert and oriented to person, place, and time.  Psychiatric:        Mood and Affect: Mood normal.        Behavior: Behavior normal.     LABS:      Latest Ref Rng & Units 12/24/2022   11:03 AM 12/01/2022   12:34 PM 11/10/2022   11:35 AM  CBC  WBC 4.0 - 10.5 K/uL 6.5  7.1  5.7   Hemoglobin 13.0 - 17.0 g/dL 15.1  16.0  15.4   Hematocrit 39.0 - 52.0 % 44.2  47.2  46.5   Platelets 150 - 400 K/uL 208  232  179       Latest Ref Rng & Units 12/24/2022   11:03 AM 12/01/2022   12:34 PM 11/10/2022   11:35 AM  CMP  Glucose 70 -  99 mg/dL 115  110  100   BUN 8 - 23 mg/dL 12  16  17    Creatinine 0.61 - 1.24 mg/dL 0.81  0.78  0.97   Sodium 135 - 145 mmol/L 136  132  138   Potassium 3.5 - 5.1 mmol/L 3.2  3.5  3.8   Chloride 98 - 111 mmol/L 97  95  100   CO2 22 - 32 mmol/L 30  28  29    Calcium 8.9 - 10.3 mg/dL 7.9  8.1  7.9   Total Protein 6.5 - 8.1 g/dL 6.6  7.3  6.7   Total Bilirubin 0.3 - 1.2 mg/dL 1.2  1.0  0.9   Alkaline Phos 38 - 126 U/L 71  83  76   AST 15 - 41 U/L 12  13  12    ALT 0 - 44 U/L 10  9  10       No results found for: "CEA1", "CEA" / No results found for: "CEA1", "CEA" No results found for: "PSA1" No results found for: "[id number]" No results found for:  "CAN125"  No results found for: "TOTALPROTELP", "ALBUMINELP", "A1GS", "A2GS", "BETS", "BETA2SER", "GAMS", "MSPIKE", "SPEI" No results found for: "TIBC", "FERRITIN", "IRONPCTSAT" No results found for: "LDH"   STUDIES:   No results found.

## 2022-12-24 NOTE — Patient Instructions (Signed)
[facility] Discharge Instructions   You were seen and examined today by Dr. Katragadda.  He reviewed the results of your lab work which are normal/stable.   We will proceed with your treatment today.  Return as scheduled.    Thank you for choosing [facility] at [facility] to provide your oncology and hematology care.  To afford each patient quality time with our provider, please arrive at least 15 minutes before your scheduled appointment time.   If you have a lab appointment with the Cancer Center please come in thru the Main Entrance and check in at the main information desk.  You need to re-schedule your appointment should you arrive 10 or more minutes late.  We strive to give you quality time with our providers, and arriving late affects you and other patients whose appointments are after yours.  Also, if you no show three or more times for appointments you may be dismissed from the clinic at the providers discretion.     Again, thank you for choosing [facility].  Our hope is that these requests will decrease the amount of time that you wait before being seen by our physicians.       _____________________________________________________________  Should you have questions after your visit to [facility], please contact our office at [phone] and follow the prompts.  Our office hours are 8:00 a.m. and 4:30 p.m. Monday - Friday.  Please note that voicemails left after 4:00 p.m. may not be returned until the following business day.  We are closed weekends and major holidays.  You do have access to a nurse 24-7, just call the main number to the clinic [phone] and do not press any options, hold on the line and a nurse will answer the phone.    For prescription refill requests, have your pharmacy contact our office and allow 72 hours.    Due to Covid, you will need to wear a mask upon entering  the hospital. If you do not have a mask, a mask will be given to you at the Main Entrance upon arrival. For doctor visits, patients may have 1 support person age 18 or older with them. For treatment visits, patients can not have anyone with them due to social distancing guidelines and our immunocompromised population.      

## 2022-12-31 IMAGING — CT NM PET TUM IMG INITIAL (PI) SKULL BASE T - THIGH
7 series · 24 of 25 positions shown · non-contrast
Comparison: Chest CT 01/21/2022

CLINICAL DATA: Initial treatment strategy for right hilar mass.

EXAM:
NUCLEAR MEDICINE PET SKULL BASE TO THIGH
TECHNIQUE: 12.9 mCi F-18 FDG was injected intravenously. Full-ring PET imaging
was performed from the skull base to thigh after the radiotracer. CT
data was obtained and used for attenuation correction and anatomic
localization.
Fasting blood glucose: 93 mg/dl

[Series 3: ctac · axial · 3.0mm · 0.98mm/px · z∈[-1000,-34]mm · 4 of 323 slices shown]
[im 1/323]
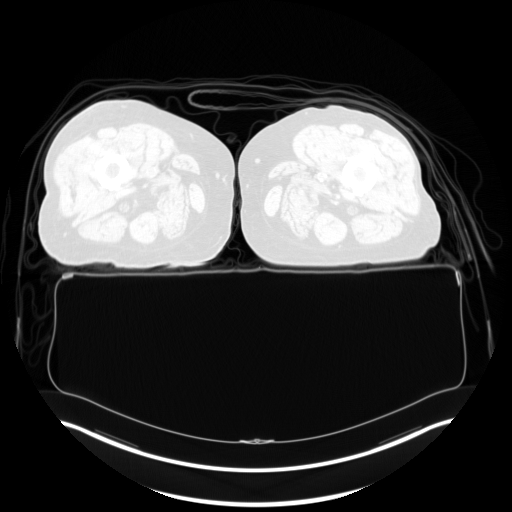
[im 108/323]
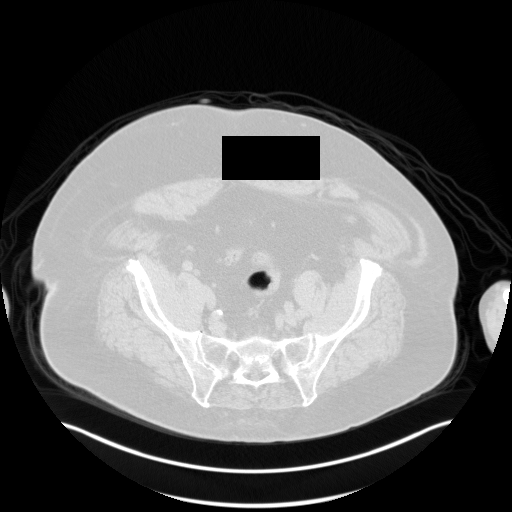
[im 215/323]
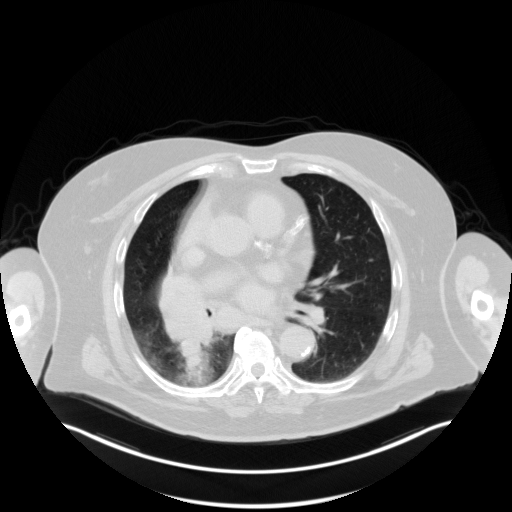
[im 323/323  brain]
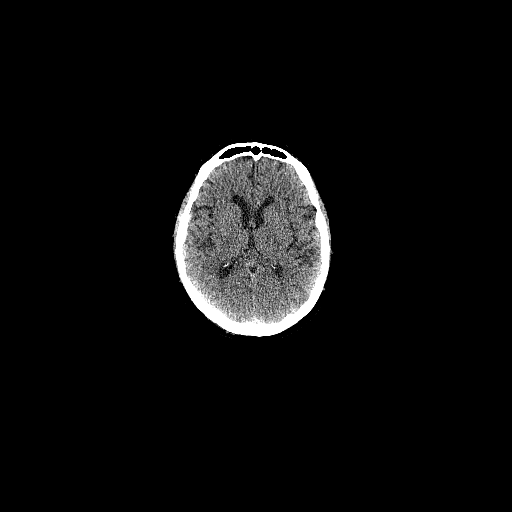

[Series 4: pet ac · axial · 3.0mm · 4.11mm/px · z∈[-1000,-34]mm · 5 of 323 slices shown]
[im 1/323]
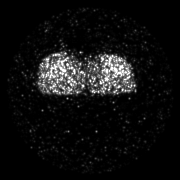
[im 81/323]
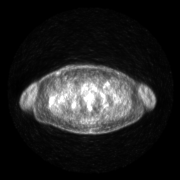
[im 162/323]
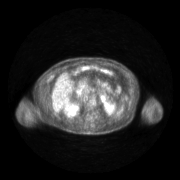
[im 242/323]
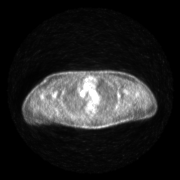
[im 323/323]
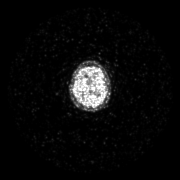

[Series 5: pet nac · axial · 3.0mm · 4.11mm/px · z∈[-1000,-34]mm · 4 of 323 slices shown]
[im 1/323]
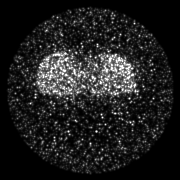
[im 81/323]
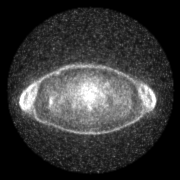
[im 162/323]
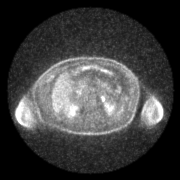
[im 323/323]
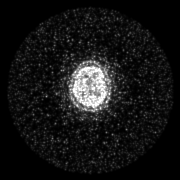

[Series 7: ct lung · axial · 3.0mm · 0.98mm/px · 1 of 96 slices shown]
[im 1/96]
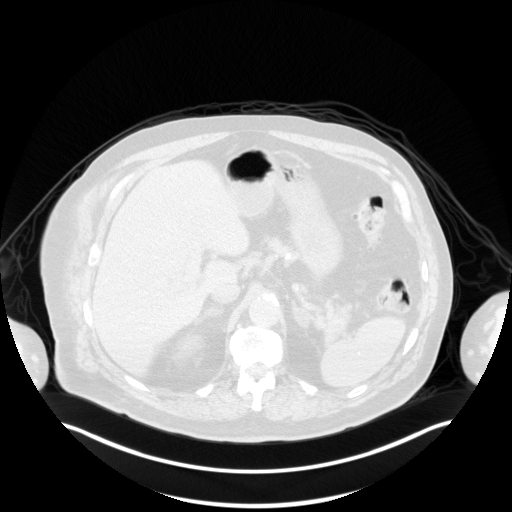

[Series 606: fused tra · 7 of 483 slices shown]
[im 1/483]
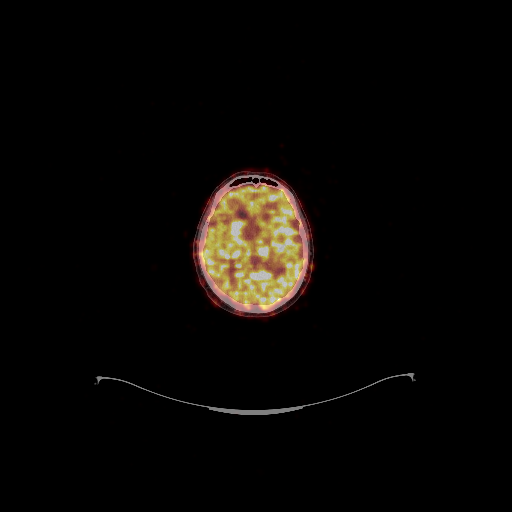
[im 81/483]
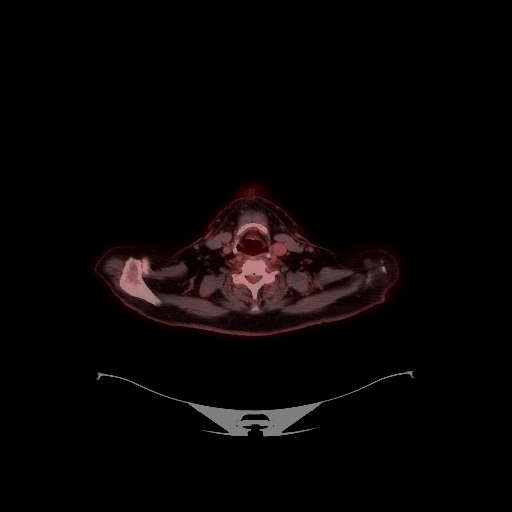
[im 161/483]
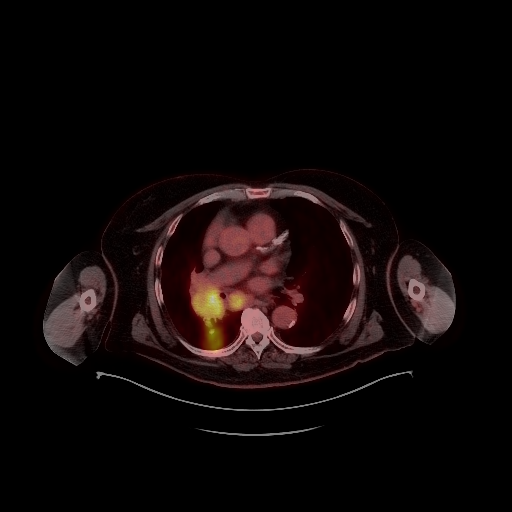
[im 242/483]
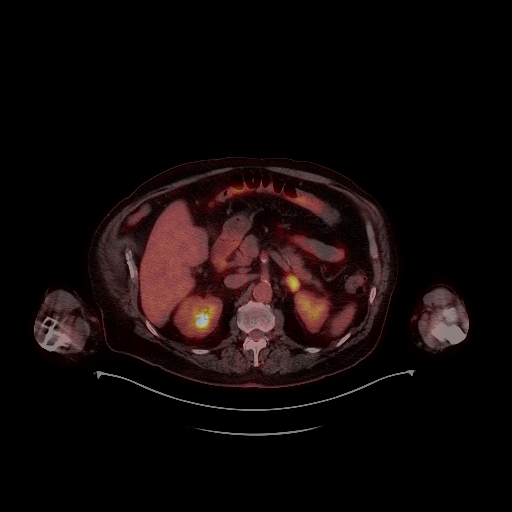
[im 322/483]
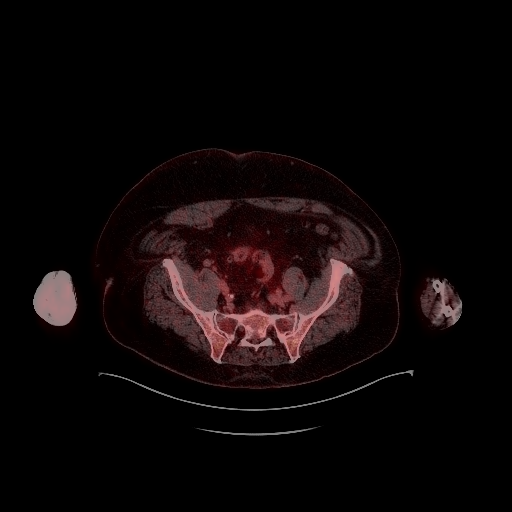
[im 402/483]
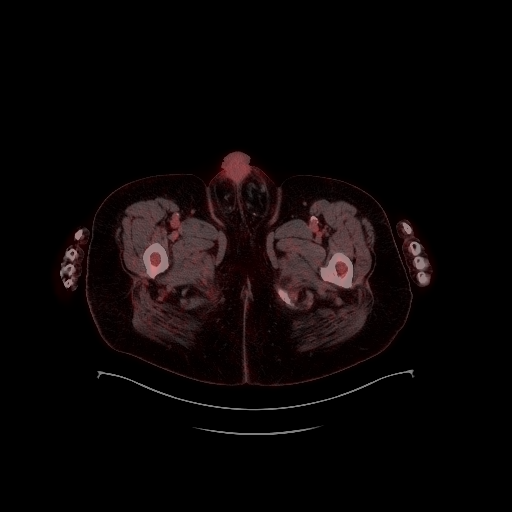
[im 483/483]
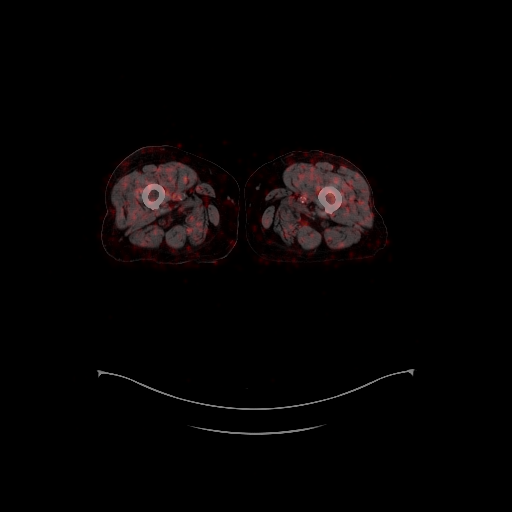

[Series 608: fused cor · 2 of 163 slices shown]
[im 1/163]
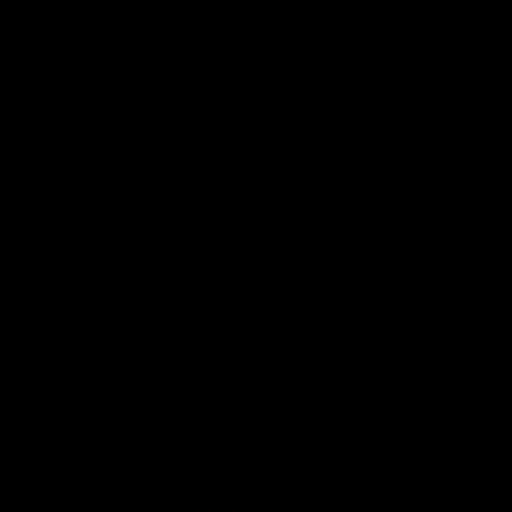
[im 163/163]
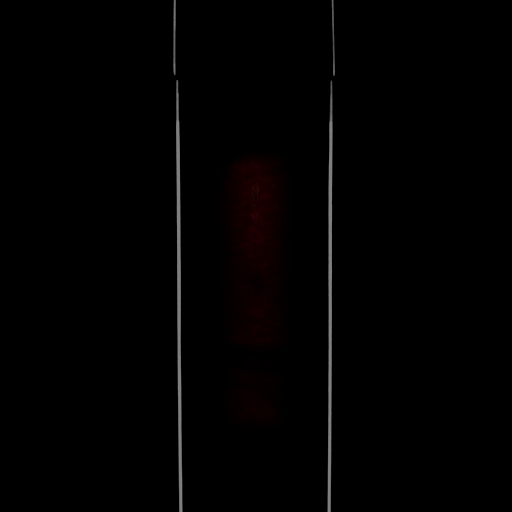

[Series 609: mip cine · coronal · 2.00mm/px · 1 of 48 slices shown]
[im 1/48]
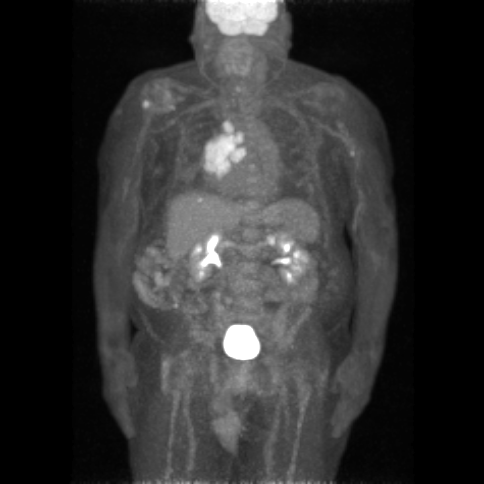

[24 of 25 positions shown; findings below may reference images not displayed]

FINDINGS: Mediastinal blood pool activity: SUV max

Liver activity: SUV max NA

NECK: No significant abnormal hypermetabolic activity in this
region.

Incidental CT findings: Chronic bilateral maxillary, bilateral
ethmoid, and left frontal sinusitis. Left common carotid
atherosclerotic calcification.

CHEST: Hypermetabolic right hilar mass spanning across the right
upper lobe, right lower lobe, and right middle lobe with associated
occlusion or near occlusion of the right middle lobe and right upper
lobe bronchi, maximum SUV 11.6. Posterior satellite nodules
including a dominant 3.3 by 2.8 cm superior segment right lower lobe
satellite nodule with maximum SUV 9.7.

Subcarinal node 2.8 cm in short axis on image 49 series 7, maximum
SUV 9.2. 1.8 cm right paratracheal lymph node on image 28 of series
7 has maximum SUV of 7.6. The posterior right paratracheal lymph
node on image 36 series 7 measures 1.4 cm in diameter with maximum
SUV 8.9.

A 1.3 by 0.5 cm sessile pleural soft tissue density posteriorly
along the right costophrenic angle on image 77 of series 7 is
hypermetabolic with maximum SUV 6.0 compatible with a small pleural
metastatic lesion.

Low-density paraspinal masses along the lower thoracic spine at
about the T10 level have low-grade metabolic activity with the
right-sided 2.8 by 1.9 cm mass with maximum SUV of 2.8 and the left
2.9 by 2.3 cm mass maximum SUV of 3.2. Location and low-density with
potential interspersed fatty elements favor extramedullary
hematopoiesis.

Incidental CT findings: Right middle lobe atelectasis. Coronary,
aortic arch, and branch vessel atherosclerotic vascular disease.
Mild cardiomegaly. Small pericardial effusion.

ABDOMEN/PELVIS: 2.3 by 1.5 cm mass of the lateral limb left adrenal
gland on image 162 series 3 with internal density of 45 Hounsfield
units and maximum SUV 8.9, compatible with adrenal metastatic
lesion. There is also a 1.1 by 1.7 cm mass of the medial limb of the
left adrenal gland with internal density of -8 Hounsfield units,
compatible with adenoma. Subtle nodularity of the right adrenal
gland noted without hypermetabolic activity or true mass.

In the right posterior subcutaneous tissues about at the level of
the iliac crest, a 1.3 by 1.0 cm soft tissue density nodule on image
204 of series 3 has maximum SUV of 5.0.

Incidental CT findings: The hypodense hepatic lesions on the
noncontrast CT dated demonstrate no hypermetabolic activity and are
likely benign lesions such as cysts. Atherosclerosis is present,
including aortoiliac atherosclerotic disease. Descending and sigmoid
colon diverticulosis.

SKELETON: A 1.3 cm lesion proximally in the right humerus on image
69 of series 3 is hypermetabolic with maximum SUV 5.9, favoring
metastatic lesion. In the left mid humerus, a small focus of
accentuated metabolic activity and accentuated density is present
about at the level of image 103 of series 3, maximum SUV 3.4. No
other hypermetabolic skeletal foci are identified.

Incidental CT findings: Old healed left anterior rib fractures.
Spondylosis noted.
IMPRESSION: 1. Right hilar mass spanning across the right upper lobe, right
lower lobe, and right middle lobe, hypermetabolic with maximum SUV
of 11.6.
2. Hypermetabolic satellite nodularity in the right lower lobe with
hypermetabolic subcarinal and right paratracheal adenopathy in the
chest.
3. Sessile hypermetabolic pleural-based nodule along the right
posterior costophrenic angle compatible with pleural metastatic
disease.
4. Hypermetabolic lesion in the left adrenal gland compatible with
adrenal metastatic lesion.
5. Small subcutaneous hypermetabolic foci in the right posterior
subcutaneous tissues about at the level of the iliac crests.
Suspicious for potential metastatic lesion.
6. Single small hypermetabolic lesions in the humerus bilaterally,
concerning for oligometastatic disease to the skeleton.
7. Fairly low level activity in the low-density paraspinal masses at
the T10 vertebral level, appearance and location favors
extramedullary hematopoiesis.
8. Other imaging findings of potential clinical significance:
Chronic paranasal sinusitis. Aortic Atherosclerosis ([id number]).
Coronary atherosclerosis. Small pericardial effusion indeterminate
for malignant involvement. Mild cardiomegaly. Hepatic hypodense
lesions are not hypermetabolic and are likely benign. Descending and
sigmoid colon diverticulosis. Spondylosis.

## 2023-01-05 ENCOUNTER — Other Ambulatory Visit: Payer: Self-pay

## 2023-01-14 ENCOUNTER — Inpatient Hospital Stay: Payer: PPO

## 2023-01-14 ENCOUNTER — Inpatient Hospital Stay: Payer: PPO | Attending: Hematology

## 2023-01-14 ENCOUNTER — Ambulatory Visit: Payer: PPO

## 2023-01-14 ENCOUNTER — Other Ambulatory Visit: Payer: PPO

## 2023-01-14 ENCOUNTER — Ambulatory Visit: Payer: PPO | Admitting: Hematology

## 2023-01-14 VITALS — BP 128/74 | HR 75 | Temp 98.5°F | Resp 18 | Wt 236.8 lb

## 2023-01-14 DIAGNOSIS — Z5112 Encounter for antineoplastic immunotherapy: Secondary | ICD-10-CM | POA: Diagnosis not present

## 2023-01-14 DIAGNOSIS — C3411 Malignant neoplasm of upper lobe, right bronchus or lung: Secondary | ICD-10-CM | POA: Diagnosis not present

## 2023-01-14 DIAGNOSIS — Z95828 Presence of other vascular implants and grafts: Secondary | ICD-10-CM

## 2023-01-14 DIAGNOSIS — Z79620 Long term (current) use of immunosuppressive biologic: Secondary | ICD-10-CM | POA: Insufficient documentation

## 2023-01-14 LAB — COMPREHENSIVE METABOLIC PANEL
ALT: 9 U/L (ref 0–44)
AST: 9 U/L — ABNORMAL LOW (ref 15–41)
Albumin: 3.8 g/dL (ref 3.5–5.0)
Alkaline Phosphatase: 73 U/L (ref 38–126)
Anion gap: 8 (ref 5–15)
BUN: 14 mg/dL (ref 8–23)
CO2: 30 mmol/L (ref 22–32)
Calcium: 8.3 mg/dL — ABNORMAL LOW (ref 8.9–10.3)
Chloride: 100 mmol/L (ref 98–111)
Creatinine, Ser: 0.82 mg/dL (ref 0.61–1.24)
GFR, Estimated: >60 mL/min (ref >60)
Glucose, Bld: 101 mg/dL — ABNORMAL HIGH (ref 70–99)
Potassium: 3.7 mmol/L (ref 3.5–5.1)
Sodium: 138 mmol/L (ref 135–145)
Total Bilirubin: 0.9 mg/dL (ref 0.3–1.2)
Total Protein: 7 g/dL (ref 6.5–8.1)

## 2023-01-14 LAB — CBC WITH DIFFERENTIAL/PLATELET
Abs Immature Granulocytes: 0.05 10*3/uL (ref 0.00–0.07)
Basophils Absolute: 0 10*3/uL (ref 0.0–0.1)
Basophils Relative: 0 %
Eosinophils Absolute: 0.3 10*3/uL (ref 0.0–0.5)
Eosinophils Relative: 4 %
HCT: 45.4 % (ref 39.0–52.0)
Hemoglobin: 15.1 g/dL (ref 13.0–17.0)
Immature Granulocytes: 1 %
Lymphocytes Relative: 14 %
Lymphs Abs: 0.9 10*3/uL (ref 0.7–4.0)
MCH: 30.9 pg (ref 26.0–34.0)
MCHC: 33.3 g/dL (ref 30.0–36.0)
MCV: 92.8 fL (ref 80.0–100.0)
Monocytes Absolute: 0.6 10*3/uL (ref 0.1–1.0)
Monocytes Relative: 9 %
Neutro Abs: 4.9 10*3/uL (ref 1.7–7.7)
Neutrophils Relative %: 72 %
Platelets: 191 10*3/uL (ref 150–400)
RBC: 4.89 MIL/uL (ref 4.22–5.81)
RDW: 14.6 % (ref 11.5–15.5)
WBC: 6.8 10*3/uL (ref 4.0–10.5)
nRBC: 0 % (ref 0.0–0.2)

## 2023-01-14 LAB — TSH: TSH: 1.005 u[IU]/mL (ref 0.350–4.500)

## 2023-01-14 LAB — MAGNESIUM: Magnesium: 2 mg/dL (ref 1.7–2.4)

## 2023-01-14 MED ORDER — SODIUM CHLORIDE 0.9% FLUSH
10.0000 mL | Freq: Once | INTRAVENOUS | Status: AC
  Administered 2023-01-14: 10 mL via INTRAVENOUS

## 2023-01-14 MED ORDER — SODIUM CHLORIDE 0.9 % IV SOLN
1200.0000 mg | Freq: Once | INTRAVENOUS | Status: AC
  Administered 2023-01-14: 1200 mg via INTRAVENOUS
  Filled 2023-01-14: qty 20

## 2023-01-14 MED ORDER — ALTEPLASE 2 MG IJ SOLR
2.0000 mg | Freq: Once | INTRAMUSCULAR | Status: AC
  Administered 2023-01-14: 2 mg
  Filled 2023-01-14: qty 2

## 2023-01-14 MED ORDER — HEPARIN SOD (PORK) LOCK FLUSH 100 UNIT/ML IV SOLN
500.0000 [IU] | Freq: Once | INTRAVENOUS | Status: AC | PRN
  Administered 2023-01-14: 500 [IU]

## 2023-01-14 MED ORDER — SODIUM CHLORIDE 0.9 % IV SOLN: Freq: Once | INTRAVENOUS | Status: AC

## 2023-01-14 MED ORDER — SODIUM CHLORIDE 0.9% FLUSH
10.0000 mL | INTRAVENOUS | Status: DC | PRN
  Administered 2023-01-14: 10 mL

## 2023-01-14 NOTE — Progress Notes (Signed)
Port flushed with no blood return noted. No bruising or swelling at site. Alteplase placed in port at 1339.

## 2023-01-14 NOTE — Patient Instructions (Signed)
[facility]  Discharge Instructions: Thank you for choosing [facility] to provide your oncology and hematology care.  If you have a lab appointment with the Cancer Center - please note that after April 8th, 2024, all labs will be drawn in the cancer center.  You do not have to check in or register with the main entrance as you have in the past but will complete your check-in in the cancer center.  Wear comfortable clothing and clothing appropriate for easy access to any Portacath or PICC line.   We strive to give you quality time with your provider. You may need to reschedule your appointment if you arrive late (15 or more minutes).  Arriving late affects you and other patients whose appointments are after yours.  Also, if you miss three or more appointments without notifying the office, you may be dismissed from the clinic at the provider's discretion.      For prescription refill requests, have your pharmacy contact our office and allow 72 hours for refills to be completed.    Today you received the following port flushed, labs drawn from arm. Alteplase placed in port.   To help prevent nausea and vomiting after your treatment, we encourage you to take your nausea medication as directed.  BELOW ARE SYMPTOMS THAT SHOULD BE REPORTED IMMEDIATELY: *FEVER GREATER THAN 100.4 F (38 C) OR HIGHER *CHILLS OR SWEATING *NAUSEA AND VOMITING THAT IS NOT CONTROLLED WITH YOUR NAUSEA MEDICATION *UNUSUAL SHORTNESS OF BREATH *UNUSUAL BRUISING OR BLEEDING *URINARY PROBLEMS (pain or burning when urinating, or frequent urination) *BOWEL PROBLEMS (unusual diarrhea, constipation, pain near the anus) TENDERNESS IN MOUTH AND THROAT WITH OR WITHOUT PRESENCE OF ULCERS (sore throat, sores in mouth, or a toothache) UNUSUAL RASH, SWELLING OR PAIN  UNUSUAL VAGINAL DISCHARGE OR ITCHING   Items with * indicate a potential emergency and should be followed up as soon as possible or go  to the Emergency Department if any problems should occur.  Please show the CHEMOTHERAPY ALERT CARD or IMMUNOTHERAPY ALERT CARD at check-in to the Emergency Department and triage nurse.  Should you have questions after your visit or need to cancel or reschedule your appointment, please contact [facility] [phone]  and follow the prompts.  Office hours are 8:00 a.m. to 4:30 p.m. Monday - Friday. Please note that voicemails left after 4:00 p.m. may not be returned until the following business day.  We are closed weekends and major holidays. You have access to a nurse at all times for urgent questions. Please call the main number to the clinic [phone] and follow the prompts.  For any non-urgent questions, you may also contact your provider using MyChart. We now offer e-Visits for anyone 49 and older to request care online for non-urgent symptoms. For details visit [email or web address].   Also download the MyChart app! Go to the app store, search "MyChart", open the app, select [place], and log in with your MyChart username and password.

## 2023-01-14 NOTE — Progress Notes (Signed)
Patient presents today for Tecentriq infusion per providers order.  Vital signs and labs within parameters for treatment.  Port initially did not give blood and alteplasewas given.  After 45 minutes checked for blood return and pulled all alteplase out and 10 cc of blood.

## 2023-01-14 NOTE — Patient Instructions (Signed)
[facility]  Discharge Instructions: Thank you for choosing [facility] to provide your oncology and hematology care.  If you have a lab appointment with the Cancer Center - please note that after April 8th, 2024, all labs will be drawn in the cancer center.  You do not have to check in or register with the main entrance as you have in the past but will complete your check-in in the cancer center.  Wear comfortable clothing and clothing appropriate for easy access to any Portacath or PICC line.   We strive to give you quality time with your provider. You may need to reschedule your appointment if you arrive late (15 or more minutes).  Arriving late affects you and other patients whose appointments are after yours.  Also, if you miss three or more appointments without notifying the office, you may be dismissed from the clinic at the provider's discretion.      For prescription refill requests, have your pharmacy contact our office and allow 72 hours for refills to be completed.    Today you received the following chemotherapy and/or immunotherapy agents Tecentriq      To help prevent nausea and vomiting after your treatment, we encourage you to take your nausea medication as directed.  BELOW ARE SYMPTOMS THAT SHOULD BE REPORTED IMMEDIATELY: *FEVER GREATER THAN 100.4 F (38 C) OR HIGHER *CHILLS OR SWEATING *NAUSEA AND VOMITING THAT IS NOT CONTROLLED WITH YOUR NAUSEA MEDICATION *UNUSUAL SHORTNESS OF BREATH *UNUSUAL BRUISING OR BLEEDING *URINARY PROBLEMS (pain or burning when urinating, or frequent urination) *BOWEL PROBLEMS (unusual diarrhea, constipation, pain near the anus) TENDERNESS IN MOUTH AND THROAT WITH OR WITHOUT PRESENCE OF ULCERS (sore throat, sores in mouth, or a toothache) UNUSUAL RASH, SWELLING OR PAIN  UNUSUAL VAGINAL DISCHARGE OR ITCHING   Items with * indicate a potential emergency and should be followed up as soon as possible or go to the  Emergency Department if any problems should occur.  Please show the CHEMOTHERAPY ALERT CARD or IMMUNOTHERAPY ALERT CARD at check-in to the Emergency Department and triage nurse.  Should you have questions after your visit or need to cancel or reschedule your appointment, please contact [facility] [phone]  and follow the prompts.  Office hours are 8:00 a.m. to 4:30 p.m. Monday - Friday. Please note that voicemails left after 4:00 p.m. may not be returned until the following business day.  We are closed weekends and major holidays. You have access to a nurse at all times for urgent questions. Please call the main number to the clinic [phone] and follow the prompts.  For any non-urgent questions, you may also contact your provider using MyChart. We now offer e-Visits for anyone 68 and older to request care online for non-urgent symptoms. For details visit [email or web address].   Also download the MyChart app! Go to the app store, search "MyChart", open the app, select [place], and log in with your MyChart username and password.

## 2023-01-17 ENCOUNTER — Other Ambulatory Visit: Payer: Self-pay

## 2023-01-28 ENCOUNTER — Encounter (HOSPITAL_COMMUNITY): Payer: Self-pay | Admitting: Hematology

## 2023-01-28 ENCOUNTER — Encounter: Payer: Self-pay | Admitting: Hematology

## 2023-02-01 ENCOUNTER — Ambulatory Visit (HOSPITAL_COMMUNITY)
Admission: RE | Admit: 2023-02-01 | Discharge: 2023-02-01 | Disposition: A | Discharge status: Home / Self Care | Payer: PPO | Source: Ambulatory Visit | Attending: Hematology | Admitting: Hematology

## 2023-02-01 DIAGNOSIS — K7689 Other specified diseases of liver: Secondary | ICD-10-CM | POA: Diagnosis not present

## 2023-02-01 DIAGNOSIS — J90 Pleural effusion, not elsewhere classified: Secondary | ICD-10-CM | POA: Diagnosis not present

## 2023-02-01 DIAGNOSIS — C3411 Malignant neoplasm of upper lobe, right bronchus or lung: Secondary | ICD-10-CM

## 2023-02-01 DIAGNOSIS — R918 Other nonspecific abnormal finding of lung field: Secondary | ICD-10-CM | POA: Diagnosis not present

## 2023-02-01 DIAGNOSIS — K5730 Diverticulosis of large intestine without perforation or abscess without bleeding: Secondary | ICD-10-CM | POA: Diagnosis not present

## 2023-02-01 MED ORDER — IOHEXOL 350 MG/ML SOLN
80.0000 mL | Freq: Once | INTRAVENOUS | Status: AC | PRN
  Administered 2023-02-01: 80 mL via INTRAVENOUS

## 2023-02-01 MED ORDER — HEPARIN SOD (PORK) LOCK FLUSH 100 UNIT/ML IV SOLN
INTRAVENOUS | Status: AC
  Filled 2023-02-01: qty 5

## 2023-02-01 MED ORDER — HEPARIN SOD (PORK) LOCK FLUSH 100 UNIT/ML IV SOLN
500.0000 [IU] | Freq: Once | INTRAVENOUS | Status: AC
  Administered 2023-02-01: 500 [IU] via INTRAVENOUS

## 2023-02-03 ENCOUNTER — Other Ambulatory Visit: Payer: Self-pay

## 2023-02-03 NOTE — Progress Notes (Signed)
[facility] [address], [place] [address]    Clinic Day:  02/04/2023  Referring physician: Elfredia Nevins, MD  Patient Care Team: Elfredia Nevins, MD as PCP - General (Internal Medicine) Doreatha Massed, MD as Medical Oncologist (Medical Oncology) Therese Sarah, RN as Oncology Nurse Navigator (Medical Oncology)   ASSESSMENT & PLAN:   Assessment: 1. Extensive stage small cell lung cancer: - Productive cough for 3 weeks with hemoptysis.  Hemoptysis improved after Eliquis stopped 2 weeks ago.  Still has some purulent sputum.  Currently on Levaquin. - Abnormal chest x-ray on 01/19/2022 for productive cough - CT chest with contrast (01/21/2022): Right hilar mass measuring 6.5 x 5.2 cm surrounding and compressing the right upper lobe bronchus, bronchus intermedius, right middle lobe bronchus and right lower lobe bronchus centrally.  Compression of the right lower lobe pulmonary artery.  Nodular soft tissue lesion in the right lower lobe continuous with right hilar mass and could represent endobronchial spread.  Enlarged mediastinal lymph nodes but no findings of pulmonary metastatic disease.  Indeterminate bilateral adrenal gland nodules and low attenuation liver lesions, likely benign cyst. - No weight loss.  No chest pains. - PET scan: Right hilar mass involving upper lobe, middle lobe and lower lobe with satellite nodularity in the right lower lobe, hypermetabolic subcarinal and right paratracheal lymphadenopathy.  Pleural-based nodule along the right posterior costophrenic angle.  Hypermetabolic left adrenal lesion.  Small hypermetabolic lesions in the humerus bilaterally and a subcutaneous focus in the right posterior lower back. - Right lung hilar mass biopsy (01/29/2022): Small cell carcinoma -4 cycles of carboplatin, etoposide and atezolizumab from 02/23/2022 through 04/29/2022, maintenance Atezolizumab ongoing   2. Social/family history: - He lives at home  with his wife Larry Moore.  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  Smoked 1 pack/day for 50 years. - Father had renal cell carcinoma.    Plan: Extensive stage small cell lung cancer: - He denies any recent respiratory infections.  He had taken antibiotic after tooth pulled out. - No immunotherapy related side effects. - CT CAP (02/01/2023): Interval small nodule measuring 1.4 x 0.9 cm.  This is on the right upper lobe posterior aspect.  No other suspicious areas. - Recommend proceeding with Atezolizumab every 3 weeks.  RTC 6 weeks for follow-up.  Will likely scan his chest after 3-4 cycles.  2.  Difficulty sleeping: - He is taking some THC supplements which are helping.  3.  Hypokalemia: - Continue potassium supplements.  Potassium is normal today.    Orders Placed This Encounter  Procedures   CBC with Differential    Standing Status:   Future    Standing Expiration Date:   04/27/2024   Comprehensive metabolic panel    Standing Status:   Future    Standing Expiration Date:   04/27/2024   CBC with Differential    Standing Status:   Future    Standing Expiration Date:   05/18/2024   Comprehensive metabolic panel    Standing Status:   Future    Standing Expiration Date:   05/18/2024      I,Katie Daubenspeck,acting as a scribe for Doreatha Massed, MD.,have documented all relevant documentation on the behalf of Doreatha Massed, MD,as directed by  Doreatha Massed, MD while in the presence of Doreatha Massed, MD.   I, Doreatha Massed MD, have reviewed the above documentation for accuracy and completeness, and I agree with the above.   [name]  Ellin Saba, MD   5/2/20244:26 PM  CHIEF COMPLAINT:   Diagnosis: extensive stage small cell lung cancer   Cancer Staging  Small cell lung cancer, right upper lobe [facility]) Staging form: Lung, AJCC 8th Edition - Clinical stage from 01/22/2022: Stage IVB (cT4, cN2, pM1c) - Signed by Doreatha Massed, MD on 02/16/2022    Prior Therapy: 4 cycles of carboplatin, etoposide and Atezolizumab completed on 04/29/2022   Current Therapy:  Maintenance Atezolizumab every 3 weeks    HISTORY OF PRESENT ILLNESS:   Oncology History  Small cell lung cancer, right upper lobe (HCC)  01/22/2022 Initial Diagnosis   Small cell lung cancer, right upper lobe (HCC)   01/22/2022 Cancer Staging   Staging form: Lung, AJCC 8th Edition - Clinical stage from 01/22/2022: Stage IVB (cT4, cN2, pM1c) - Signed by Doreatha Massed, MD on 02/16/2022 Histopathologic type: Small cell carcinoma, NOS Stage prefix: Initial diagnosis   02/23/2022 - 05/25/2022 Chemotherapy   Patient is on Treatment Plan : LUNG SCLC Carboplatin + Etoposide + Atezolizumab Induction q21d / Atezolizumab Maintenance q21d     02/23/2022 -  Chemotherapy   Patient is on Treatment Plan : LUNG SCLC Carboplatin + Etoposide + Atezolizumab Induction q21d x 4 cycles / Atezolizumab Maintenance q21d        INTERVAL HISTORY:   Larry Moore is a 72 y.o. male presenting to clinic today for follow up of extensive stage small cell lung cancer. He was last seen by me on 12/24/22.  Since his last visit, he underwent restaging CT C/A/P on 02/01/23 showing: interval development of clustered nodularity in posterior aspect of RUL, largest measuring 1.4 cm; decreased size in right pleural effusion, now trace; stable small adrenal nodules, previously treated adrenal metastasis no longer visualized; no signs of new metastatic disease.  Today, he states that he is doing well overall. His appetite level is at 70%. His energy level is at 65%.  PAST MEDICAL HISTORY:   Past Medical History: Past Medical History:  Diagnosis Date   Dyspnea    Dysrhythmia    hx a-fib   Hypertension    Insomnia    Pre-diabetes    Sleep apnea     Surgical History: Past Surgical History:  Procedure Laterality Date   ATRIAL FIBRILLATION ABLATION     approx 2020   CARDIAC  CATHETERIZATION     HERNIA REPAIR     IR IMAGING GUIDED PORT INSERTION  02/17/2022   IR VENO/JUGULAR RIGHT  02/17/2022   VIDEO BRONCHOSCOPY WITH ENDOBRONCHIAL ULTRASOUND Bilateral 01/29/2022   Procedure: VIDEO BRONCHOSCOPY WITH ENDOBRONCHIAL ULTRASOUND;  Surgeon: Josephine Igo, DO;  Location: [facility] OR;  Service: Cardiopulmonary;  Laterality: Bilateral;  w/ Guardant [id number]    Social History: Social History   Socioeconomic History   Marital status: Married    Spouse name: Not on file   Number of children: Not on file   Years of education: Not on file   Highest education level: Not on file  Occupational History   Occupation: Retired  Tobacco Use   Smoking status: Every Day    Types: Cigarettes    Passive exposure: Current   Smokeless tobacco: Not on file   Tobacco comments:    Pt smokes 1 ppd. 01/26/22  Vaping Use   Vaping Use: Never used  Substance and Sexual Activity   Alcohol use: Not Currently    Comment: 1 bottle wine a day    Drug use: No   Sexual activity: Not on file  Other Topics  Concern   Not on file  Social History Narrative   Not on file   Social Determinants of Health   Financial Resource Strain: Low Risk  (12/16/2022)   Overall Financial Resource Strain (CARDIA)    Difficulty of Paying Living Expenses: Not hard at all  Food Insecurity: No Food Insecurity (12/16/2022)   Hunger Vital Sign    Worried About Running Out of Food in the Last Year: Never true    Ran Out of Food in the Last Year: Never true  Transportation Needs: No Transportation Needs (12/16/2022)   PRAPARE - Administrator, Civil Service (Medical): No    Lack of Transportation (Non-Medical): No  Physical Activity: Not on file  Stress: Not on file  Social Connections: Not on file  Intimate Partner Violence: Not on file    Family History: Family History  Problem Relation Age of Onset   Coronary artery disease Father    Diabetes Father     Current Medications:  Current  Outpatient Medications:    allopurinol (ZYLOPRIM) 300 MG tablet, Take 600 mg by mouth daily., Disp: , Rfl:    ALPRAZolam (XANAX) 2 MG tablet, Take 1 tablet (2 mg total) by mouth at bedtime as needed for anxiety., Disp: 30 tablet, Rfl: 3   amoxicillin (AMOXIL) 500 MG capsule, Take 500 mg by mouth every 8 (eight) hours., Disp: , Rfl:    Atezolizumab (TECENTRIQ IV), Inject into the vein every 21 ( twenty-one) days., Disp: , Rfl:    furosemide (LASIX) 80 MG tablet, Take 80 mg by mouth daily., Disp: , Rfl:    lidocaine-prilocaine (EMLA) cream, Apply topically as directed., Disp: , Rfl:    metolazone (ZAROXOLYN) 2.5 MG tablet, Take 2.5 mg by mouth daily., Disp: , Rfl:    potassium chloride (KLOR-CON M) 10 MEQ tablet, Take 1 tablet (10 mEq total) by mouth 2 (two) times daily., Disp: 180 tablet, Rfl: 3   sotalol (BETAPACE) 80 MG tablet, SMARTSIG:1 Tablet(s) By Mouth Every 12 Hours, Disp: , Rfl:    Vitamin D, Ergocalciferol, (DRISDOL) 1.25 MG (50000 UNIT) CAPS capsule, Take 50,000 Units by mouth daily., Disp: , Rfl:  No current facility-administered medications for this visit.  Facility-Administered Medications Ordered in Other Visits:    sodium chloride flush (NS) 0.9 % injection 10 mL, 10 mL, Intracatheter, PRN, Doreatha Massed, MD, 10 mL at 02/04/23 1457   Allergies: No Known Allergies  REVIEW OF SYSTEMS:   Review of Systems  Constitutional:  Negative for chills, fatigue and fever.  HENT:   Negative for lump/mass, mouth sores, nosebleeds, sore throat and trouble swallowing.   Eyes:  Negative for eye problems.  Respiratory:  Negative for cough and shortness of breath.   Cardiovascular:  Negative for chest pain, leg swelling and palpitations.  Gastrointestinal:  Negative for abdominal pain, constipation, diarrhea, nausea and vomiting.  Genitourinary:  Negative for bladder incontinence, difficulty urinating, dysuria, frequency, hematuria and nocturia.   Musculoskeletal:  Positive for back  pain. Negative for arthralgias, flank pain, myalgias and neck pain.  Skin:  Negative for itching and rash.  Neurological:  Negative for dizziness, headaches and numbness.  Hematological:  Does not bruise/bleed easily.  Psychiatric/Behavioral:  Positive for depression. Negative for sleep disturbance and suicidal ideas. The patient is nervous/anxious.   All other systems reviewed and are negative.    VITALS:   There were no vitals taken for this visit.  Wt Readings from Last 3 Encounters:  02/04/23 232 lb 14.4 oz (105.6  kg)  01/14/23 236 lb 12.8 oz (107.4 kg)  12/24/22 234 lb 6.4 oz (106.3 kg)    There is no height or weight on file to calculate BMI.  Performance status (ECOG): 1 - Symptomatic but completely ambulatory  PHYSICAL EXAM:   Physical Exam Vitals and nursing note reviewed. Exam conducted with a chaperone present.  Constitutional:      Appearance: Normal appearance.  Cardiovascular:     Rate and Rhythm: Normal rate and regular rhythm.     Pulses: Normal pulses.     Heart sounds: Normal heart sounds.  Pulmonary:     Effort: Pulmonary effort is normal.     Breath sounds: Normal breath sounds.  Abdominal:     Palpations: Abdomen is soft. There is no hepatomegaly, splenomegaly or mass.     Tenderness: There is no abdominal tenderness.  Musculoskeletal:     Right lower leg: No edema.     Left lower leg: No edema.  Lymphadenopathy:     Cervical: No cervical adenopathy.     Right cervical: No superficial, deep or posterior cervical adenopathy.    Left cervical: No superficial, deep or posterior cervical adenopathy.     Upper Body:     Right upper body: No supraclavicular or axillary adenopathy.     Left upper body: No supraclavicular or axillary adenopathy.  Neurological:     General: No focal deficit present.     Mental Status: He is alert and oriented to person, place, and time.  Psychiatric:        Mood and Affect: Mood normal.        Behavior: Behavior normal.      LABS:      Latest Ref Rng & Units 02/04/2023   12:21 PM 01/14/2023    1:24 PM 12/24/2022   11:03 AM  CBC  WBC 4.0 - 10.5 K/uL 6.6  6.8  6.5   Hemoglobin 13.0 - 17.0 g/dL 29.5  28.4  13.2   Hematocrit 39.0 - 52.0 % 47.7  45.4  44.2   Platelets 150 - 400 K/uL 183  191  208       Latest Ref Rng & Units 02/04/2023   12:21 PM 01/14/2023    1:24 PM 12/24/2022   11:03 AM  CMP  Glucose 70 - 99 mg/dL 99  440  102   BUN 8 - 23 mg/dL 17  14  12    Creatinine 0.61 - 1.24 mg/dL 7.25  3.66  4.40   Sodium 135 - 145 mmol/L 137  138  136   Potassium 3.5 - 5.1 mmol/L 4.0  3.7  3.2   Chloride 98 - 111 mmol/L 100  100  97   CO2 22 - 32 mmol/L 28  30  30    Calcium 8.9 - 10.3 mg/dL 8.2  8.3  7.9   Total Protein 6.5 - 8.1 g/dL 7.0  7.0  6.6   Total Bilirubin 0.3 - 1.2 mg/dL 0.8  0.9  1.2   Alkaline Phos 38 - 126 U/L 74  73  71   AST 15 - 41 U/L 13  9  12    ALT 0 - 44 U/L 9  9  10       No results found for: "CEA1", "CEA" / No results found for: "CEA1", "CEA" No results found for: "PSA1" No results found for: "HKV425" No results found for: "CAN125"  No results found for: "TOTALPROTELP", "ALBUMINELP", "A1GS", "A2GS", "BETS", "BETA2SER", "GAMS", "MSPIKE", "SPEI" No results found  for: "TIBC", "FERRITIN", "IRONPCTSAT" No results found for: "LDH"   STUDIES:   CT CHEST ABDOMEN PELVIS W CONTRAST  Result Date: 02/02/2023 CLINICAL DATA:  72 year old male with history of non-small cell lung cancer with metastatic disease. * Tracking Code: BO * EXAM: CT CHEST, ABDOMEN, AND PELVIS WITH CONTRAST TECHNIQUE: Multidetector CT imaging of the chest, abdomen and pelvis was performed following the standard protocol during bolus administration of intravenous contrast. RADIATION DOSE REDUCTION: This exam was performed according to the departmental dose-optimization program which includes automated exposure control, adjustment of the mA and/or kV according to patient size and/or use of iterative reconstruction  technique. CONTRAST:  80mL OMNIPAQUE IOHEXOL 350 MG/ML SOLN COMPARISON:  CT of the chest, abdomen and pelvis 11/02/2022. FINDINGS: CT CHEST FINDINGS Cardiovascular: Heart size is normal. Small amount of pericardial fluid and/or thickening, stable compared to the prior study and unlikely to be of any hemodynamic significance at this time. No pericardial calcification. There is aortic atherosclerosis, as well as atherosclerosis of the great vessels of the mediastinum and the coronary arteries, including calcified atherosclerotic plaque in the left main, left anterior descending, left circumflex and right coronary arteries. Left internal jugular single-lumen Port-A-Cath with tip terminating at the superior cavoatrial junction. Mediastinum/Nodes: Soft tissue attenuation nodules in the paraspinal regions bilaterally (axial image 44 of series 3), similar to prior studies, measuring 2.2 x 1.7 cm on the right and 2.6 x 2.0 cm on the left, presumably extramedullary hematopoiesis. No pathologically enlarged mediastinal or hilar lymph nodes. Esophagus is unremarkable in appearance. No axillary lymphadenopathy. Lungs/Pleura: Clustered nodularity in the posterior aspect of the right upper lobe is noted on today's examination, new compared to the prior study, with the largest of these nodules measuring up to 1.4 x 0.9 cm (axial image 61 of series 4). Left lung is clear. Trace residual right pleural effusion, decreased in size. No left pleural effusion. Musculoskeletal: There are no aggressive appearing lytic or blastic lesions noted in the visualized portions of the skeleton. CT ABDOMEN PELVIS FINDINGS Hepatobiliary: Multiple subcentimeter low-attenuation lesions scattered throughout the hepatic parenchyma, too small to characterize, but similar to the prior study and statistically likely tiny cysts or biliary hamartomas (no imaging follow-up recommended). No other new aggressive appearing hepatic lesions are noted. No intra or  extrahepatic biliary ductal dilatation. Gallbladder is unremarkable in appearance. Pancreas: No pancreatic mass. No pancreatic ductal dilatation. No pancreatic or peripancreatic fluid collections or inflammatory changes. Spleen: Unremarkable. Adrenals/Urinary Tract: 1.4 x 1.1 cm left adrenal nodule (axial image 60 of series 3) and 1.3 x 0.9 cm right adrenal nodule (axial image 56 of series 3), similar to the prior examination. Subcentimeter low-attenuation lesions in the left kidney, too small to definitively characterize, but statistically likely tiny cysts. Right kidney is unremarkable in appearance. Urinary bladder is nearly completely decompressed, but otherwise unremarkable in appearance. Stomach/Bowel: The appearance of the stomach is normal. There is no pathologic dilatation of small bowel or colon. Numerous colonic diverticula are noted, without surrounding inflammatory changes to indicate an acute diverticulitis at this time. Normal appendix. Vascular/Lymphatic: Atherosclerotic calcifications are noted in the abdominal aorta and pelvic vasculature. No lymphadenopathy noted in the abdomen or pelvis. Reproductive: Prostate gland and seminal vesicles are unremarkable in appearance. Other: No significant volume of ascites.  No pneumoperitoneum. Musculoskeletal: There are no aggressive appearing lytic or blastic lesions noted in the visualized portions of the skeleton. IMPRESSION: 1. Interval development of some clustered nodularity in the posterior aspect of the right upper lobe, with  the largest of these new nodules measuring 1.4 x 0.9 cm. These could be infectious or inflammatory in etiology, however, close attention on follow-up studies is recommended to ensure resolution of these findings, as locally recurrent disease is not excluded. 2. Decreased size of what is now a trace right pleural effusion compared to the prior study. 3. Stable small adrenal nodules, similar to prior examinations. Previously treated  left adrenal metastasis is no longer visualized. No signs of new metastatic disease noted elsewhere in the abdomen or pelvis. 4. Stable appearance of soft tissue attenuation paraspinal nodules, similar to numerous prior examinations, previously not hypermetabolic on PET-CT, presumably extramedullary hematopoiesis. 5. Colonic diverticulosis without evidence of acute diverticulitis at this time. 6. Additional incidental findings, as above. Electronically Signed   By: Trudie Reed M.D.   On: 02/02/2023 10:27

## 2023-02-04 ENCOUNTER — Inpatient Hospital Stay: Payer: PPO

## 2023-02-04 ENCOUNTER — Inpatient Hospital Stay (HOSPITAL_BASED_OUTPATIENT_CLINIC_OR_DEPARTMENT_OTHER): Payer: PPO | Admitting: Hematology

## 2023-02-04 ENCOUNTER — Inpatient Hospital Stay: Payer: PPO | Attending: Hematology

## 2023-02-04 VITALS — BP 141/78 | HR 62 | Temp 97.2°F | Resp 18 | Wt 232.9 lb

## 2023-02-04 VITALS — BP 109/78 | HR 64 | Temp 97.7°F | Resp 20

## 2023-02-04 DIAGNOSIS — F17210 Nicotine dependence, cigarettes, uncomplicated: Secondary | ICD-10-CM | POA: Insufficient documentation

## 2023-02-04 DIAGNOSIS — C3411 Malignant neoplasm of upper lobe, right bronchus or lung: Secondary | ICD-10-CM | POA: Diagnosis not present

## 2023-02-04 DIAGNOSIS — Z8051 Family history of malignant neoplasm of kidney: Secondary | ICD-10-CM | POA: Insufficient documentation

## 2023-02-04 DIAGNOSIS — Z5112 Encounter for antineoplastic immunotherapy: Secondary | ICD-10-CM | POA: Diagnosis not present

## 2023-02-04 DIAGNOSIS — Z95828 Presence of other vascular implants and grafts: Secondary | ICD-10-CM

## 2023-02-04 DIAGNOSIS — Z79620 Long term (current) use of immunosuppressive biologic: Secondary | ICD-10-CM | POA: Insufficient documentation

## 2023-02-04 DIAGNOSIS — E876 Hypokalemia: Secondary | ICD-10-CM | POA: Insufficient documentation

## 2023-02-04 DIAGNOSIS — E279 Disorder of adrenal gland, unspecified: Secondary | ICD-10-CM | POA: Diagnosis not present

## 2023-02-04 DIAGNOSIS — G4700 Insomnia, unspecified: Secondary | ICD-10-CM | POA: Insufficient documentation

## 2023-02-04 LAB — COMPREHENSIVE METABOLIC PANEL
ALT: 9 U/L (ref 0–44)
AST: 13 U/L — ABNORMAL LOW (ref 15–41)
Albumin: 3.8 g/dL (ref 3.5–5.0)
Alkaline Phosphatase: 74 U/L (ref 38–126)
Anion gap: 9 (ref 5–15)
BUN: 17 mg/dL (ref 8–23)
CO2: 28 mmol/L (ref 22–32)
Calcium: 8.2 mg/dL — ABNORMAL LOW (ref 8.9–10.3)
Chloride: 100 mmol/L (ref 98–111)
Creatinine, Ser: 0.83 mg/dL (ref 0.61–1.24)
GFR, Estimated: >60 mL/min (ref >60)
Glucose, Bld: 99 mg/dL (ref 70–99)
Potassium: 4 mmol/L (ref 3.5–5.1)
Sodium: 137 mmol/L (ref 135–145)
Total Bilirubin: 0.8 mg/dL (ref 0.3–1.2)
Total Protein: 7 g/dL (ref 6.5–8.1)

## 2023-02-04 LAB — CBC WITH DIFFERENTIAL/PLATELET
Abs Immature Granulocytes: 0.03 10*3/uL (ref 0.00–0.07)
Basophils Absolute: 0 10*3/uL (ref 0.0–0.1)
Basophils Relative: 1 %
Eosinophils Absolute: 0.2 10*3/uL (ref 0.0–0.5)
Eosinophils Relative: 3 %
HCT: 47.7 % (ref 39.0–52.0)
Hemoglobin: 16.2 g/dL (ref 13.0–17.0)
Immature Granulocytes: 1 %
Lymphocytes Relative: 12 %
Lymphs Abs: 0.8 10*3/uL (ref 0.7–4.0)
MCH: 31.3 pg (ref 26.0–34.0)
MCHC: 34 g/dL (ref 30.0–36.0)
MCV: 92.3 fL (ref 80.0–100.0)
Monocytes Absolute: 0.5 10*3/uL (ref 0.1–1.0)
Monocytes Relative: 7 %
Neutro Abs: 5.1 10*3/uL (ref 1.7–7.7)
Neutrophils Relative %: 76 %
Platelets: 183 10*3/uL (ref 150–400)
RBC: 5.17 MIL/uL (ref 4.22–5.81)
RDW: 14.6 % (ref 11.5–15.5)
WBC: 6.6 10*3/uL (ref 4.0–10.5)
nRBC: 0 % (ref 0.0–0.2)

## 2023-02-04 LAB — MAGNESIUM: Magnesium: 2 mg/dL (ref 1.7–2.4)

## 2023-02-04 LAB — TSH: TSH: 1.894 u[IU]/mL (ref 0.350–4.500)

## 2023-02-04 MED ORDER — SODIUM CHLORIDE 0.9 % IV SOLN: Freq: Once | INTRAVENOUS | Status: AC

## 2023-02-04 MED ORDER — HEPARIN SOD (PORK) LOCK FLUSH 100 UNIT/ML IV SOLN
500.0000 [IU] | Freq: Once | INTRAVENOUS | Status: AC | PRN
  Administered 2023-02-04: 500 [IU]

## 2023-02-04 MED ORDER — SODIUM CHLORIDE 0.9 % IV SOLN
1200.0000 mg | Freq: Once | INTRAVENOUS | Status: AC
  Administered 2023-02-04: 1200 mg via INTRAVENOUS
  Filled 2023-02-04: qty 20

## 2023-02-04 MED ORDER — SODIUM CHLORIDE 0.9% FLUSH
10.0000 mL | Freq: Once | INTRAVENOUS | Status: AC
  Administered 2023-02-04: 10 mL via INTRAVENOUS

## 2023-02-04 MED ORDER — SODIUM CHLORIDE 0.9% FLUSH
10.0000 mL | INTRAVENOUS | Status: DC | PRN
  Administered 2023-02-04: 10 mL

## 2023-02-04 NOTE — Patient Instructions (Addendum)
[facility] at [facility] Discharge Instructions   You were seen and examined today by Dr. Ellin Saba.  He reviewed the results of your CT scan. The results are stable.   He reviewed the results of your lab work which are normal/stable.   We will proceed with your treatment today.   Return as scheduled.    Thank you for choosing [facility] at [facility] to provide your oncology and hematology care.  To afford each patient quality time with our provider, please arrive at least 15 minutes before your scheduled appointment time.   If you have a lab appointment with the Cancer Center please come in thru the Main Entrance and check in at the main information desk.  You need to re-schedule your appointment should you arrive 10 or more minutes late.  We strive to give you quality time with our providers, and arriving late affects you and other patients whose appointments are after yours.  Also, if you no show three or more times for appointments you may be dismissed from the clinic at the providers discretion.     Again, thank you for choosing [facility].  Our hope is that these requests will decrease the amount of time that you wait before being seen by our physicians.       _____________________________________________________________  Should you have questions after your visit to [facility], please contact our office at [phone] and follow the prompts.  Our office hours are 8:00 a.m. and 4:30 p.m. Monday - Friday.  Please note that voicemails left after 4:00 p.m. may not be returned until the following business day.  We are closed weekends and major holidays.  You do have access to a nurse 24-7, just call the main number to the clinic [phone] and do not press any options, hold on the line and a nurse will answer the phone.    For prescription refill requests, have your pharmacy contact our office and allow 72  hours.    Due to Covid, you will need to wear a mask upon entering the hospital. If you do not have a mask, a mask will be given to you at the Main Entrance upon arrival. For doctor visits, patients may have 1 support person age 34 or older with them. For treatment visits, patients can not have anyone with them due to social distancing guidelines and our immunocompromised population.

## 2023-02-04 NOTE — Patient Instructions (Signed)
[facility]  Discharge Instructions: Thank you for choosing [facility] to provide your oncology and hematology care.  If you have a lab appointment with the Cancer Center - please note that after April 8th, 2024, all labs will be drawn in the cancer center.  You do not have to check in or register with the main entrance as you have in the past but will complete your check-in in the cancer center.  Wear comfortable clothing and clothing appropriate for easy access to any Portacath or PICC line.   We strive to give you quality time with your provider. You may need to reschedule your appointment if you arrive late (15 or more minutes).  Arriving late affects you and other patients whose appointments are after yours.  Also, if you miss three or more appointments without notifying the office, you may be dismissed from the clinic at the provider's discretion.      For prescription refill requests, have your pharmacy contact our office and allow 72 hours for refills to be completed.    Today you received the following immunotherapy Tecentriq   To help prevent nausea and vomiting after your treatment, we encourage you to take your nausea medication as directed.  BELOW ARE SYMPTOMS THAT SHOULD BE REPORTED IMMEDIATELY: *FEVER GREATER THAN 100.4 F (38 C) OR HIGHER *CHILLS OR SWEATING *NAUSEA AND VOMITING THAT IS NOT CONTROLLED WITH YOUR NAUSEA MEDICATION *UNUSUAL SHORTNESS OF BREATH *UNUSUAL BRUISING OR BLEEDING *URINARY PROBLEMS (pain or burning when urinating, or frequent urination) *BOWEL PROBLEMS (unusual diarrhea, constipation, pain near the anus) TENDERNESS IN MOUTH AND THROAT WITH OR WITHOUT PRESENCE OF ULCERS (sore throat, sores in mouth, or a toothache) UNUSUAL RASH, SWELLING OR PAIN  UNUSUAL VAGINAL DISCHARGE OR ITCHING   Items with * indicate a potential emergency and should be followed up as soon as possible or go to the Emergency Department if any  problems should occur.  Please show the CHEMOTHERAPY ALERT CARD or IMMUNOTHERAPY ALERT CARD at check-in to the Emergency Department and triage nurse.  Should you have questions after your visit or need to cancel or reschedule your appointment, please contact [facility] [phone]  and follow the prompts.  Office hours are 8:00 a.m. to 4:30 p.m. Monday - Friday. Please note that voicemails left after 4:00 p.m. may not be returned until the following business day.  We are closed weekends and major holidays. You have access to a nurse at all times for urgent questions. Please call the main number to the clinic [phone] and follow the prompts.  For any non-urgent questions, you may also contact your provider using MyChart. We now offer e-Visits for anyone 51 and older to request care online for non-urgent symptoms. For details visit [email or web address].   Also download the MyChart app! Go to the app store, search "MyChart", open the app, select [facility], and log in with your MyChart username and password.

## 2023-02-04 NOTE — Progress Notes (Signed)
Labs reviewed at office visit today. Ok to treat per MD.   Treatment given per orders. Patient tolerated it well without problems. Vitals stable and discharged home from clinic via wheelchair. Follow up as scheduled.

## 2023-02-06 ENCOUNTER — Other Ambulatory Visit: Payer: Self-pay

## 2023-02-06 LAB — T4: T4, Total: 7.6 ug/dL (ref 4.5–12.0)

## 2023-02-14 ENCOUNTER — Other Ambulatory Visit: Payer: Self-pay

## 2023-02-22 ENCOUNTER — Other Ambulatory Visit: Payer: Self-pay

## 2023-02-22 DIAGNOSIS — C3411 Malignant neoplasm of upper lobe, right bronchus or lung: Secondary | ICD-10-CM

## 2023-02-23 ENCOUNTER — Inpatient Hospital Stay: Payer: PPO

## 2023-02-23 ENCOUNTER — Ambulatory Visit: Payer: PPO | Admitting: Hematology

## 2023-02-23 VITALS — BP 87/55 | HR 65 | Temp 97.4°F | Resp 18

## 2023-02-23 VITALS — BP 103/75 | HR 67 | Temp 97.5°F | Resp 18 | Ht 68.0 in | Wt 225.4 lb

## 2023-02-23 DIAGNOSIS — Z5112 Encounter for antineoplastic immunotherapy: Secondary | ICD-10-CM | POA: Diagnosis not present

## 2023-02-23 DIAGNOSIS — C3411 Malignant neoplasm of upper lobe, right bronchus or lung: Secondary | ICD-10-CM

## 2023-02-23 DIAGNOSIS — Z95828 Presence of other vascular implants and grafts: Secondary | ICD-10-CM

## 2023-02-23 LAB — COMPREHENSIVE METABOLIC PANEL
ALT: 10 U/L (ref 0–44)
AST: 11 U/L — ABNORMAL LOW (ref 15–41)
Albumin: 4 g/dL (ref 3.5–5.0)
Alkaline Phosphatase: 69 U/L (ref 38–126)
Anion gap: 9 (ref 5–15)
BUN: 21 mg/dL (ref 8–23)
CO2: 31 mmol/L (ref 22–32)
Calcium: 8.2 mg/dL — ABNORMAL LOW (ref 8.9–10.3)
Chloride: 96 mmol/L — ABNORMAL LOW (ref 98–111)
Creatinine, Ser: 0.94 mg/dL (ref 0.61–1.24)
GFR, Estimated: >60 mL/min (ref >60)
Glucose, Bld: 86 mg/dL (ref 70–99)
Potassium: 3.4 mmol/L — ABNORMAL LOW (ref 3.5–5.1)
Sodium: 136 mmol/L (ref 135–145)
Total Bilirubin: 1.1 mg/dL (ref 0.3–1.2)
Total Protein: 7 g/dL (ref 6.5–8.1)

## 2023-02-23 LAB — MAGNESIUM: Magnesium: 1.9 mg/dL (ref 1.7–2.4)

## 2023-02-23 LAB — CBC WITH DIFFERENTIAL/PLATELET
Abs Immature Granulocytes: 0.06 10*3/uL (ref 0.00–0.07)
Basophils Absolute: 0 10*3/uL (ref 0.0–0.1)
Basophils Relative: 1 %
Eosinophils Absolute: 0.3 10*3/uL (ref 0.0–0.5)
Eosinophils Relative: 3 %
HCT: 49.3 % (ref 39.0–52.0)
Hemoglobin: 16.6 g/dL (ref 13.0–17.0)
Immature Granulocytes: 1 %
Lymphocytes Relative: 10 %
Lymphs Abs: 0.8 10*3/uL (ref 0.7–4.0)
MCH: 31.2 pg (ref 26.0–34.0)
MCHC: 33.7 g/dL (ref 30.0–36.0)
MCV: 92.7 fL (ref 80.0–100.0)
Monocytes Absolute: 0.8 10*3/uL (ref 0.1–1.0)
Monocytes Relative: 9 %
Neutro Abs: 6.7 10*3/uL (ref 1.7–7.7)
Neutrophils Relative %: 76 %
Platelets: 178 10*3/uL (ref 150–400)
RBC: 5.32 MIL/uL (ref 4.22–5.81)
RDW: 14.6 % (ref 11.5–15.5)
WBC: 8.6 10*3/uL (ref 4.0–10.5)
nRBC: 0 % (ref 0.0–0.2)

## 2023-02-23 LAB — TSH: TSH: 0.678 u[IU]/mL (ref 0.350–4.500)

## 2023-02-23 MED ORDER — SODIUM CHLORIDE 0.9 % IV SOLN: Freq: Once | INTRAVENOUS | Status: AC

## 2023-02-23 MED ORDER — SODIUM CHLORIDE 0.9% FLUSH
10.0000 mL | Freq: Once | INTRAVENOUS | Status: AC
  Administered 2023-02-23: 10 mL via INTRAVENOUS

## 2023-02-23 MED ORDER — HEPARIN SOD (PORK) LOCK FLUSH 100 UNIT/ML IV SOLN
500.0000 [IU] | Freq: Once | INTRAVENOUS | Status: AC | PRN
  Administered 2023-02-23: 500 [IU]

## 2023-02-23 MED ORDER — SODIUM CHLORIDE 0.9% FLUSH
10.0000 mL | INTRAVENOUS | Status: DC | PRN
  Administered 2023-02-23: 10 mL

## 2023-02-23 MED ORDER — SODIUM CHLORIDE 0.9 % IV SOLN
1200.0000 mg | Freq: Once | INTRAVENOUS | Status: AC
  Administered 2023-02-23: 1200 mg via INTRAVENOUS
  Filled 2023-02-23: qty 20

## 2023-02-23 NOTE — Progress Notes (Signed)
Patient presents today for Tecentriq infusion. Vital signs within parameters for treatment. Labs within parameters for treatment. Patient denies any side effects related to his last treatment. No complaints noted today.

## 2023-02-23 NOTE — Progress Notes (Signed)
Tecentriq given today per MD orders. Tolerated infusion without adverse affects. Vital signs stable. No complaints at this time. Discharged from clinic ambulatory in stable condition. Alert and oriented x 3. F/U with [facility] as scheduled.

## 2023-02-23 NOTE — Patient Instructions (Signed)
[facility]  Discharge Instructions: Thank you for choosing [facility] to provide your oncology and hematology care.  If you have a lab appointment with the Cancer Center - please note that after April 8th, 2024, all labs will be drawn in the cancer center.  You do not have to check in or register with the main entrance as you have in the past but will complete your check-in in the cancer center.  Wear comfortable clothing and clothing appropriate for easy access to any Portacath or PICC line.   We strive to give you quality time with your provider. You may need to reschedule your appointment if you arrive late (15 or more minutes).  Arriving late affects you and other patients whose appointments are after yours.  Also, if you miss three or more appointments without notifying the office, you may be dismissed from the clinic at the provider's discretion.      For prescription refill requests, have your pharmacy contact our office and allow 72 hours for refills to be completed.    Today you received the following chemotherapy and/or immunotherapy agents tecentriq. Atezolizumab Injection What is this medication? ATEZOLIZUMAB (a te zoe LIZ ue mab) treats some types of cancer. It works by helping your immune system slow or stop the spread of cancer cells. It is a monoclonal antibody. This medicine may be used for other purposes; ask your health care provider or pharmacist if you have questions. COMMON BRAND NAME(S): Tecentriq What should I tell my care team before I take this medication? They need to know if you have any of these conditions: Allogeneic stem cell transplant (uses someone else's stem cells) Autoimmune diseases, such as Crohn disease, ulcerative colitis, lupus History of chest radiation Nervous system problems, such as Guillain-Barre syndrome, myasthenia gravis Organ transplant An unusual or allergic reaction to atezolizumab, other medications,  foods, dyes, or preservatives Pregnant or trying to get pregnant Breast-feeding How should I use this medication? This medication is injected into a vein. It is given by your care team in a hospital or clinic setting. A special MedGuide will be given to you before each treatment. Be sure to read this information carefully each time. Talk to your care team about the use of this medication in children. While it may be prescribed for children as young as 2 years for selected conditions, precautions do apply. Overdosage: If you think you have taken too much of this medicine contact a poison control center or emergency room at once. NOTE: This medicine is only for you. Do not share this medicine with others. What if I miss a dose? Keep appointments for follow-up doses. It is important not to miss your dose. Call your care team if you are unable to keep an appointment. What may interact with this medication? Interactions have not been studied. This list may not describe all possible interactions. Give your health care provider a list of all the medicines, herbs, non-prescription drugs, or dietary supplements you use. Also tell them if you smoke, drink alcohol, or use illegal drugs. Some items may interact with your medicine. What should I watch for while using this medication? Your condition will be monitored carefully while you are receiving this medication. You may need blood work while taking this medication. This medication may cause serious skin reactions. They can happen weeks to months after starting the medication. Contact your care team right away if you notice fevers or flu-like symptoms with a rash. The  rash may be red or purple and then turn into blisters or peeling of the skin. You may also notice a red rash with swelling of the face, lips, or lymph nodes in your neck or under your arms. Tell your care team right away if you have any change in your eyesight. Talk to your care team if you may  be pregnant. Serious birth defects can occur if you take this medication during pregnancy and for 5 months after the last dose. You will need a negative pregnancy test before starting this medication. Contraception is recommended while taking this medication and for 5 months after the last dose. Your care team can help you find the option that works for you. Do not breastfeed while taking this medication and for at least 5 months after the last dose. What side effects may I notice from receiving this medication? Side effects that you should report to your doctor or health care professional as soon as possible: Allergic reactions--skin rash, itching, hives, swelling of the face, lips, tongue, or throat Dry cough, shortness of breath or trouble breathing Eye pain, redness, irritation, or discharge with blurry or decreased vision Heart muscle inflammation--unusual weakness or fatigue, shortness of breath, chest pain, fast or irregular heartbeat, dizziness, swelling of the ankles, feet, or hands Hormone gland problems--headache, sensitivity to light, unusual weakness or fatigue, dizziness, fast or irregular heartbeat, increased sensitivity to cold or heat, excessive sweating, constipation, hair loss, increased thirst or amount of urine, tremors or shaking, irritability Infusion reactions--chest pain, shortness of breath or trouble breathing, feeling faint or lightheaded Kidney injury (glomerulonephritis)--decrease in the amount of urine, red or dark brown urine, foamy or bubbly urine, swelling of the ankles, hands, or feet Liver injury--right upper belly pain, loss of appetite, nausea, light-colored stool, dark yellow or brown urine, yellowing skin or eyes, unusual weakness or fatigue Pain, tingling, or numbness in the hands or feet, muscle weakness, change in vision, confusion or trouble speaking, loss of balance or coordination, trouble walking, seizures Rash, fever, and swollen lymph nodes Redness,  blistering, peeling, or loosening of the skin, including inside the mouth Sudden or severe stomach pain, bloody diarrhea, fever, nausea, vomiting Side effects that usually do not require medical attention (report to your doctor or health care professional if they continue or are bothersome): Bone, joint, or muscle pain Diarrhea Fatigue Loss of appetite Nausea Skin rash This list may not describe all possible side effects. Call your doctor for medical advice about side effects. You may report side effects to FDA at 1-800-FDA-1088. Where should I keep my medication? This medication is given in a hospital or clinic. It will not be stored at home. NOTE: This sheet is a summary. It may not cover all possible information. If you have questions about this medicine, talk to your doctor, pharmacist, or health care provider.  2023 Elsevier/Gold Standard (2022-01-12 00:00:00)       To help prevent nausea and vomiting after your treatment, we encourage you to take your nausea medication as directed.  BELOW ARE SYMPTOMS THAT SHOULD BE REPORTED IMMEDIATELY: *FEVER GREATER THAN 100.4 F (38 C) OR HIGHER *CHILLS OR SWEATING *NAUSEA AND VOMITING THAT IS NOT CONTROLLED WITH YOUR NAUSEA MEDICATION *UNUSUAL SHORTNESS OF BREATH *UNUSUAL BRUISING OR BLEEDING *URINARY PROBLEMS (pain or burning when urinating, or frequent urination) *BOWEL PROBLEMS (unusual diarrhea, constipation, pain near the anus) TENDERNESS IN MOUTH AND THROAT WITH OR WITHOUT PRESENCE OF ULCERS (sore throat, sores in mouth, or a toothache) UNUSUAL RASH, SWELLING  OR PAIN  UNUSUAL VAGINAL DISCHARGE OR ITCHING   Items with * indicate a potential emergency and should be followed up as soon as possible or go to the Emergency Department if any problems should occur.  Please show the CHEMOTHERAPY ALERT CARD or IMMUNOTHERAPY ALERT CARD at check-in to the Emergency Department and triage nurse.  Should you have questions after your visit or  need to cancel or reschedule your appointment, please contact [facility] [phone]  and follow the prompts.  Office hours are 8:00 a.m. to 4:30 p.m. Monday - Friday. Please note that voicemails left after 4:00 p.m. may not be returned until the following business day.  We are closed weekends and major holidays. You have access to a nurse at all times for urgent questions. Please call the main number to the clinic [phone] and follow the prompts.  For any non-urgent questions, you may also contact your provider using MyChart. We now offer e-Visits for anyone 59 and older to request care online for non-urgent symptoms. For details visit [email or web address].   Also download the MyChart app! Go to the app store, search "MyChart", open the app, select [place], and log in with your MyChart username and password.

## 2023-02-25 DIAGNOSIS — M79674 Pain in right toe(s): Secondary | ICD-10-CM | POA: Diagnosis not present

## 2023-02-25 DIAGNOSIS — B351 Tinea unguium: Secondary | ICD-10-CM | POA: Diagnosis not present

## 2023-02-27 ENCOUNTER — Other Ambulatory Visit: Payer: Self-pay

## 2023-03-15 ENCOUNTER — Other Ambulatory Visit: Payer: Self-pay

## 2023-03-15 DIAGNOSIS — C3411 Malignant neoplasm of upper lobe, right bronchus or lung: Secondary | ICD-10-CM

## 2023-03-16 NOTE — Progress Notes (Signed)
[facility] [address], [place] [address]    Clinic Day:  04/12/2023  Referring physician: Elfredia Nevins, MD  Patient Care Team: Elfredia Nevins, MD as PCP - General (Internal Medicine) Doreatha Massed, MD as Medical Oncologist (Medical Oncology) Therese Sarah, RN as Oncology Nurse Navigator (Medical Oncology)   ASSESSMENT & PLAN:   Assessment: 1. Extensive stage small cell lung cancer: - Productive cough for 3 weeks with hemoptysis.  Hemoptysis improved after Eliquis stopped 2 weeks ago.  Still has some purulent sputum.  Currently on Levaquin. - Abnormal chest x-ray on 01/19/2022 for productive cough - CT chest with contrast (01/21/2022): Right hilar mass measuring 6.5 x 5.2 cm surrounding and compressing the right upper lobe bronchus, bronchus intermedius, right middle lobe bronchus and right lower lobe bronchus centrally.  Compression of the right lower lobe pulmonary artery.  Nodular soft tissue lesion in the right lower lobe continuous with right hilar mass and could represent endobronchial spread.  Enlarged mediastinal lymph nodes but no findings of pulmonary metastatic disease.  Indeterminate bilateral adrenal gland nodules and low attenuation liver lesions, likely benign cyst. - No weight loss.  No chest pains. - PET scan: Right hilar mass involving upper lobe, middle lobe and lower lobe with satellite nodularity in the right lower lobe, hypermetabolic subcarinal and right paratracheal lymphadenopathy.  Pleural-based nodule along the right posterior costophrenic angle.  Hypermetabolic left adrenal lesion.  Small hypermetabolic lesions in the humerus bilaterally and a subcutaneous focus in the right posterior lower back. - Right lung hilar mass biopsy (01/29/2022): Small cell carcinoma -4 cycles of carboplatin, etoposide and atezolizumab from 02/23/2022 through 04/29/2022, maintenance Atezolizumab ongoing   2. Social/family history: - He lives at home  with his wife [name].  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  Smoked 1 pack/day for 50 years. - Father had renal cell carcinoma.    Plan: Extensive stage small cell lung cancer: - He denies any recent respiratory infections. - No immunotherapy related side effects.  Reports unsteadiness when he first gets up and has to use walker. - Reviewed labs today: Normal LFTs and creatinine.  CBC grossly normal.  TSH is 0.658. - Proceed with Atezolizumab today.  Recommend CT chest and brain MRI prior to next visit in 3 weeks.  2.  Difficulty sleeping: - He takes THC supplements which are helping.  3.  Hypokalemia: - Continue potassium supplements.  Potassium is normal today.    Orders Placed This Encounter  Procedures   CT Chest W Contrast    Standing Status:   Future    Number of Occurrences:   1    Standing Expiration Date:   03/16/2024    Order Specific Question:   If indicated for the ordered procedure, I authorize the administration of contrast media per Radiology protocol    Answer:   Yes    Order Specific Question:   Does the patient have a contrast media/X-ray dye allergy?    Answer:   No    Order Specific Question:   Preferred imaging location?    Answer:   [facility]   MR Brain W Wo Contrast    Standing Status:   Future    Number of Occurrences:   1    Standing Expiration Date:   03/16/2024    Order Specific Question:   If indicated for the ordered procedure, I authorize the administration of contrast media per Radiology protocol  Answer:   Yes    Order Specific Question:   What is the patient's sedation requirement?    Answer:   No Sedation    Order Specific Question:   Does the patient have a pacemaker or implanted devices?    Answer:   No    Order Specific Question:   Use SRS Protocol?    Answer:   No    Order Specific Question:   Preferred imaging location?    Answer:   [facility] (table limit - 550lbs)       I,Katie Daubenspeck,acting as a scribe for Doreatha Massed, MD.,have documented all relevant documentation on the behalf of Doreatha Massed, MD,as directed by  Doreatha Massed, MD while in the presence of Doreatha Massed, MD.   I, Doreatha Massed MD, have reviewed the above documentation for accuracy and completeness, and I agree with the above.   Doreatha Massed, MD   7/8/20244:46 PM  CHIEF COMPLAINT:   Diagnosis: extensive stage small cell lung cancer    Cancer Staging  Small cell lung cancer, right upper lobe [facility]) Staging form: Lung, AJCC 8th Edition - Clinical stage from 01/22/2022: Stage IVB (cT4, cN2, pM1c) - Signed by Doreatha Massed, MD on 02/16/2022    Prior Therapy: 4 cycles of carboplatin, etoposide and Atezolizumab completed on 04/29/2022   Current Therapy:  Maintenance Atezolizumab every 3 weeks    HISTORY OF PRESENT ILLNESS:   Oncology History  Small cell lung cancer, right upper lobe (HCC)  01/22/2022 Initial Diagnosis   Small cell lung cancer, right upper lobe (HCC)   01/22/2022 Cancer Staging   Staging form: Lung, AJCC 8th Edition - Clinical stage from 01/22/2022: Stage IVB (cT4, cN2, pM1c) - Signed by Doreatha Massed, MD on 02/16/2022 Histopathologic type: Small cell carcinoma, NOS Stage prefix: Initial diagnosis   02/23/2022 - 05/25/2022 Chemotherapy   Patient is on Treatment Plan : LUNG SCLC Carboplatin + Etoposide + Atezolizumab Induction q21d / Atezolizumab Maintenance q21d     02/23/2022 -  Chemotherapy   Patient is on Treatment Plan : LUNG SCLC Carboplatin + Etoposide + Atezolizumab Induction q21d x 4 cycles / Atezolizumab Maintenance q21d        INTERVAL HISTORY:   Jacory is a 72 y.o. male presenting to clinic today for follow up of extensive stage small cell lung cancer. He was last seen by me on 02/04/23.  Today, he states that he is doing well overall. His appetite level is at 40%. His energy level is at  30%.  PAST MEDICAL HISTORY:   Past Medical History: Past Medical History:  Diagnosis Date   Dyspnea    Dysrhythmia    hx a-fib   Hypertension    Insomnia    Pre-diabetes    Sleep apnea     Surgical History: Past Surgical History:  Procedure Laterality Date   ATRIAL FIBRILLATION ABLATION     approx 2020   CARDIAC CATHETERIZATION     HERNIA REPAIR     IR IMAGING GUIDED PORT INSERTION  02/17/2022   IR VENO/JUGULAR RIGHT  02/17/2022   VIDEO BRONCHOSCOPY WITH ENDOBRONCHIAL ULTRASOUND Bilateral 01/29/2022   Procedure: VIDEO BRONCHOSCOPY WITH ENDOBRONCHIAL ULTRASOUND;  Surgeon: Josephine Igo, DO;  Location: [facility] OR;  Service: Cardiopulmonary;  Laterality: Bilateral;  w/ Guardant [id number]    Social History: Social History   Socioeconomic History   Marital status: Married    Spouse name: Not on file   Number of children: Not on file   Years  of education: Not on file   Highest education level: Not on file  Occupational History   Occupation: Retired  Tobacco Use   Smoking status: Every Day    Types: Cigarettes    Passive exposure: Current   Smokeless tobacco: Not on file   Tobacco comments:    Pt smokes 1 ppd. 01/26/22  Vaping Use   Vaping Use: Never used  Substance and Sexual Activity   Alcohol use: Not Currently    Comment: 1 bottle wine a day    Drug use: No   Sexual activity: Not on file  Other Topics Concern   Not on file  Social History Narrative   Not on file   Social Determinants of Health   Financial Resource Strain: Low Risk  (12/16/2022)   Overall Financial Resource Strain (CARDIA)    Difficulty of Paying Living Expenses: Not hard at all  Food Insecurity: No Food Insecurity (12/16/2022)   Hunger Vital Sign    Worried About Running Out of Food in the Last Year: Never true    Ran Out of Food in the Last Year: Never true  Transportation Needs: No Transportation Needs (12/16/2022)   PRAPARE - Administrator, Civil Service (Medical): No    Lack of  Transportation (Non-Medical): No  Physical Activity: Not on file  Stress: Not on file  Social Connections: Not on file  Intimate Partner Violence: Not on file    Family History: Family History  Problem Relation Age of Onset   Coronary artery disease Father    Diabetes Father     Current Medications:  Current Outpatient Medications:    allopurinol (ZYLOPRIM) 300 MG tablet, Take 600 mg by mouth daily., Disp: , Rfl:    amoxicillin (AMOXIL) 500 MG capsule, Take 500 mg by mouth every 8 (eight) hours., Disp: , Rfl:    Atezolizumab (TECENTRIQ IV), Inject into the vein every 21 ( twenty-one) days., Disp: , Rfl:    furosemide (LASIX) 80 MG tablet, Take 80 mg by mouth daily., Disp: , Rfl:    metolazone (ZAROXOLYN) 2.5 MG tablet, Take 2.5 mg by mouth daily., Disp: , Rfl:    potassium chloride (KLOR-CON M) 10 MEQ tablet, Take 1 tablet (10 mEq total) by mouth 2 (two) times daily., Disp: 180 tablet, Rfl: 3   sotalol (BETAPACE) 80 MG tablet, SMARTSIG:1 Tablet(s) By Mouth Every 12 Hours, Disp: , Rfl:    Vitamin D, Ergocalciferol, (DRISDOL) 1.25 MG (50000 UNIT) CAPS capsule, Take 50,000 Units by mouth daily., Disp: , Rfl:    alprazolam (XANAX) 2 MG tablet, Take 1 tablet (2 mg total) by mouth at bedtime as needed for anxiety., Disp: 30 tablet, Rfl: 3   lidocaine-prilocaine (EMLA) cream, Apply topically as directed., Disp: , Rfl:    Allergies: No Known Allergies  REVIEW OF SYSTEMS:   Review of Systems  Constitutional:  Positive for fatigue. Negative for chills and fever.  HENT:   Negative for lump/mass, mouth sores, nosebleeds, sore throat and trouble swallowing.   Eyes:  Negative for eye problems.  Respiratory:  Negative for cough and shortness of breath.   Cardiovascular:  Negative for chest pain, leg swelling and palpitations.  Gastrointestinal:  Negative for abdominal pain, constipation, diarrhea, nausea and vomiting.  Genitourinary:  Negative for bladder incontinence, difficulty urinating,  dysuria, frequency, hematuria and nocturia.   Musculoskeletal:  Negative for arthralgias, back pain, flank pain, myalgias and neck pain.  Skin:  Negative for itching and rash.  Neurological:  Negative  for dizziness, headaches and numbness.  Hematological:  Does not bruise/bleed easily.  Psychiatric/Behavioral:  Negative for depression, sleep disturbance and suicidal ideas. The patient is not nervous/anxious.   All other systems reviewed and are negative.    VITALS:   Blood pressure 98/64, pulse 75, temperature 97.7 F (36.5 C), temperature source Oral, resp. rate 16, weight 229 lb 12.8 oz (104.2 kg), SpO2 93 %.  Wt Readings from Last 3 Encounters:  04/06/23 225 lb 6.4 oz (102.2 kg)  03/17/23 229 lb 12.8 oz (104.2 kg)  02/23/23 225 lb 6.4 oz (102.2 kg)    Body mass index is 34.94 kg/m.  Performance status (ECOG): 1 - Symptomatic but completely ambulatory  PHYSICAL EXAM:   Physical Exam Vitals and nursing note reviewed. Exam conducted with a chaperone present.  Constitutional:      Appearance: Normal appearance.  Cardiovascular:     Rate and Rhythm: Normal rate and regular rhythm.     Pulses: Normal pulses.     Heart sounds: Normal heart sounds.  Pulmonary:     Effort: Pulmonary effort is normal.     Breath sounds: Normal breath sounds.  Abdominal:     Palpations: Abdomen is soft. There is no hepatomegaly, splenomegaly or mass.     Tenderness: There is no abdominal tenderness.  Musculoskeletal:     Right lower leg: No edema.     Left lower leg: No edema.  Lymphadenopathy:     Cervical: No cervical adenopathy.     Right cervical: No superficial, deep or posterior cervical adenopathy.    Left cervical: No superficial, deep or posterior cervical adenopathy.     Upper Body:     Right upper body: No supraclavicular or axillary adenopathy.     Left upper body: No supraclavicular or axillary adenopathy.  Neurological:     General: No focal deficit present.     Mental Status:  He is alert and oriented to person, place, and time.  Psychiatric:        Mood and Affect: Mood normal.        Behavior: Behavior normal.     LABS:      Latest Ref Rng & Units 04/06/2023   12:05 PM 03/17/2023   10:55 AM 02/23/2023   11:00 AM  CBC  WBC 4.0 - 10.5 K/uL 6.5  7.6  8.6   Hemoglobin 13.0 - 17.0 g/dL 16.1  09.6  04.5   Hematocrit 39.0 - 52.0 % 47.0  45.7  49.3   Platelets 150 - 400 K/uL 177  218  178       Latest Ref Rng & Units 04/06/2023   12:05 PM 03/17/2023   10:55 AM 02/23/2023   11:00 AM  CMP  Glucose 70 - 99 mg/dL 409  811  86   BUN 8 - 23 mg/dL 13  10  21    Creatinine 0.61 - 1.24 mg/dL 9.14  7.82  9.56   Sodium 135 - 145 mmol/L 136  135  136   Potassium 3.5 - 5.1 mmol/L 3.5  3.9  3.4   Chloride 98 - 111 mmol/L 99  102  96   CO2 22 - 32 mmol/L 31  25  31    Calcium 8.9 - 10.3 mg/dL 8.1  8.1  8.2   Total Protein 6.5 - 8.1 g/dL 6.7  6.6  7.0   Total Bilirubin 0.3 - 1.2 mg/dL 0.7  1.1  1.1   Alkaline Phos 38 - 126 U/L 70  71  69   AST 15 - 41 U/L 10  14  11    ALT 0 - 44 U/L 9  10  10       No results found for: "CEA1", "CEA" / No results found for: "CEA1", "CEA" No results found for: "PSA1" No results found for: "WNU272" No results found for: "CAN125"  No results found for: "TOTALPROTELP", "ALBUMINELP", "A1GS", "A2GS", "BETS", "BETA2SER", "GAMS", "MSPIKE", "SPEI" No results found for: "TIBC", "FERRITIN", "IRONPCTSAT" No results found for: "LDH"   STUDIES:   MR Brain W Wo Contrast  Result Date: 04/10/2023 CLINICAL DATA:  Non-small cell lung cancer monitoring. EXAM: MRI HEAD WITHOUT AND WITH CONTRAST TECHNIQUE: Multiplanar, multiecho pulse sequences of the brain and surrounding structures were obtained without and with intravenous contrast. CONTRAST:  10mL GADAVIST GADOBUTROL 1 MMOL/ML IV SOLN COMPARISON:  11/02/2022 FINDINGS: Brain: No enhancement or swelling to suggest metastatic disease. Chronic lacune in the right thalamus. Generalized mild cerebral volume  loss. No acute or subacute infarct, hemorrhage, hydrocephalus, or collection Vascular: Tortuous intracranial vessels. Preserved flow voids and vascular enhancement Skull and upper cervical spine: Normal marrow signal Sinuses/Orbits: Mucosal thickening in the paranasal sinuses greatest at the frontal ethmoidal recesses and right maxillary. IMPRESSION: No evidence of metastatic disease. Electronically Signed   By: Tiburcio Pea M.D.   On: 04/10/2023 05:26   CT Chest W Contrast  Result Date: 04/05/2023 CLINICAL DATA:  Non-small-cell lung cancer.  * Tracking Code: BO * EXAM: CT CHEST WITH CONTRAST TECHNIQUE: Multidetector CT imaging of the chest was performed during intravenous contrast administration. RADIATION DOSE REDUCTION: This exam was performed according to the departmental dose-optimization program which includes automated exposure control, adjustment of the mA and/or kV according to patient size and/or use of iterative reconstruction technique. CONTRAST:  OMNIPAQUE IOHEXOL 300 MG/ML  SOLN COMPARISON:  02/01/2023 FINDINGS: Cardiovascular: Left Port-A-Cath tip at high right atrium. Aortic atherosclerosis. Tortuous descending thoracic aorta. Mild cardiomegaly with lipomatous hypertrophy of the interatrial septum. Three vessel coronary artery calcification. Minimal pericardial fluid, similar. Pulmonary artery enlargement, outflow tract 3.3 cm. No central pulmonary embolism, on this non-dedicated study. Mediastinum/Nodes: No supraclavicular adenopathy. No mediastinal or hilar adenopathy. Again identified is presumed extramedullary hematopoiesis in the lower thoracic paraspinous space bilaterally on 112/2. Lungs/Pleura: No pleural fluid.  Mild centrilobular emphysema. Areas of posterior right upper lobe nodularity are improved and less well-defined today. Example nodule on the right major fissure at 3 mm on 64/4 versus 5 mm on the prior exam (when remeasured). The more lateral posterior right upper lobe  nodularity is less well-defined today, now ground-glass in morphology including on 66/4. There is also similar mild subpleural predominant superior segment right lower lobe ground-glass. Slight increase in inferior posterior right upper lobe ground-glass including on 76/4. Upper Abdomen: Small bilateral hepatic cysts. Normal imaged portions of the spleen, pancreas, gallbladder, kidneys. Proximal gastric underdistention. Left-greater-than-right adrenal nodularity is unchanged. Including at maximally 1.3 cm on the left. Musculoskeletal: Mild bilateral gynecomastia. 9th posterolateral left rib remote fracture. IMPRESSION: 1. No specific findings to suggest metastatic disease. 2. The posterior right upper lobe nodularity is improved, less well-defined today. There is progressive right upper lobe ground-glass opacity which is nonspecific. Presuming no radiation therapy in this region, drug toxicity would be a consideration. 3. Aortic atherosclerosis (ICD10-I70.0), coronary artery atherosclerosis and emphysema (ICD10-J43.9). 4. Pulmonary artery enlargement suggests pulmonary arterial hypertension. 5. Similar bilateral adrenal nodularity 6. Similar small pericardial effusion. Electronically Signed   By: Jeronimo Greaves M.D.   On: 04/05/2023  08:36      

## 2023-03-17 ENCOUNTER — Inpatient Hospital Stay: Payer: PPO

## 2023-03-17 ENCOUNTER — Other Ambulatory Visit: Payer: Self-pay | Admitting: *Deleted

## 2023-03-17 ENCOUNTER — Inpatient Hospital Stay: Payer: PPO | Attending: Hematology

## 2023-03-17 ENCOUNTER — Inpatient Hospital Stay (HOSPITAL_BASED_OUTPATIENT_CLINIC_OR_DEPARTMENT_OTHER): Payer: PPO | Admitting: Hematology

## 2023-03-17 VITALS — BP 123/71 | HR 71 | Temp 97.4°F | Resp 16

## 2023-03-17 DIAGNOSIS — Z95828 Presence of other vascular implants and grafts: Secondary | ICD-10-CM

## 2023-03-17 DIAGNOSIS — C3411 Malignant neoplasm of upper lobe, right bronchus or lung: Secondary | ICD-10-CM | POA: Insufficient documentation

## 2023-03-17 DIAGNOSIS — Z5112 Encounter for antineoplastic immunotherapy: Secondary | ICD-10-CM | POA: Insufficient documentation

## 2023-03-17 DIAGNOSIS — F17210 Nicotine dependence, cigarettes, uncomplicated: Secondary | ICD-10-CM | POA: Diagnosis not present

## 2023-03-17 DIAGNOSIS — E876 Hypokalemia: Secondary | ICD-10-CM | POA: Diagnosis not present

## 2023-03-17 DIAGNOSIS — Z79620 Long term (current) use of immunosuppressive biologic: Secondary | ICD-10-CM | POA: Insufficient documentation

## 2023-03-17 LAB — CBC WITH DIFFERENTIAL/PLATELET
Abs Immature Granulocytes: 0.03 10*3/uL (ref 0.00–0.07)
Basophils Absolute: 0 10*3/uL (ref 0.0–0.1)
Basophils Relative: 0 %
Eosinophils Absolute: 0.1 10*3/uL (ref 0.0–0.5)
Eosinophils Relative: 2 %
HCT: 45.7 % (ref 39.0–52.0)
Hemoglobin: 15.5 g/dL (ref 13.0–17.0)
Immature Granulocytes: 0 %
Lymphocytes Relative: 10 %
Lymphs Abs: 0.7 10*3/uL (ref 0.7–4.0)
MCH: 31.1 pg (ref 26.0–34.0)
MCHC: 33.9 g/dL (ref 30.0–36.0)
MCV: 91.8 fL (ref 80.0–100.0)
Monocytes Absolute: 0.4 10*3/uL (ref 0.1–1.0)
Monocytes Relative: 6 %
Neutro Abs: 6.2 10*3/uL (ref 1.7–7.7)
Neutrophils Relative %: 82 %
Platelets: 218 10*3/uL (ref 150–400)
RBC: 4.98 MIL/uL (ref 4.22–5.81)
RDW: 14.3 % (ref 11.5–15.5)
WBC: 7.6 10*3/uL (ref 4.0–10.5)
nRBC: 0 % (ref 0.0–0.2)

## 2023-03-17 LAB — COMPREHENSIVE METABOLIC PANEL
ALT: 10 U/L (ref 0–44)
AST: 14 U/L — ABNORMAL LOW (ref 15–41)
Albumin: 3.6 g/dL (ref 3.5–5.0)
Alkaline Phosphatase: 71 U/L (ref 38–126)
Anion gap: 8 (ref 5–15)
BUN: 10 mg/dL (ref 8–23)
CO2: 25 mmol/L (ref 22–32)
Calcium: 8.1 mg/dL — ABNORMAL LOW (ref 8.9–10.3)
Chloride: 102 mmol/L (ref 98–111)
Creatinine, Ser: 0.8 mg/dL (ref 0.61–1.24)
GFR, Estimated: >60 mL/min (ref >60)
Glucose, Bld: 143 mg/dL — ABNORMAL HIGH (ref 70–99)
Potassium: 3.9 mmol/L (ref 3.5–5.1)
Sodium: 135 mmol/L (ref 135–145)
Total Bilirubin: 1.1 mg/dL (ref 0.3–1.2)
Total Protein: 6.6 g/dL (ref 6.5–8.1)

## 2023-03-17 LAB — MAGNESIUM: Magnesium: 1.7 mg/dL (ref 1.7–2.4)

## 2023-03-17 LAB — TSH: TSH: 0.658 u[IU]/mL (ref 0.350–4.500)

## 2023-03-17 MED ORDER — SODIUM CHLORIDE 0.9% FLUSH
10.0000 mL | INTRAVENOUS | Status: DC | PRN
  Administered 2023-03-17: 10 mL

## 2023-03-17 MED ORDER — SODIUM CHLORIDE 0.9 % IV SOLN
1200.0000 mg | Freq: Once | INTRAVENOUS | Status: AC
  Administered 2023-03-17: 1200 mg via INTRAVENOUS
  Filled 2023-03-17: qty 20

## 2023-03-17 MED ORDER — HEPARIN SOD (PORK) LOCK FLUSH 100 UNIT/ML IV SOLN
500.0000 [IU] | Freq: Once | INTRAVENOUS | Status: AC | PRN
  Administered 2023-03-17: 500 [IU]

## 2023-03-17 MED ORDER — ALPRAZOLAM 2 MG PO TABS: 2.0000 mg | ORAL_TABLET | Freq: Every evening | ORAL | 3 refills | Status: DC | PRN

## 2023-03-17 MED ORDER — SODIUM CHLORIDE 0.9 % IV SOLN: Freq: Once | INTRAVENOUS | Status: AC

## 2023-03-17 MED ORDER — SODIUM CHLORIDE 0.9% FLUSH
10.0000 mL | Freq: Once | INTRAVENOUS | Status: AC
  Administered 2023-03-17: 10 mL via INTRAVENOUS

## 2023-03-17 NOTE — Patient Instructions (Signed)
[facility] at [facility] Discharge Instructions   You were seen and examined today by Dr. Katragadda.  He reviewed the results of your lab work which are normal/stable.   We will proceed with your treatment today.  Return as scheduled.    Thank you for choosing [facility] at [facility] to provide your oncology and hematology care.  To afford each patient quality time with our provider, please arrive at least 15 minutes before your scheduled appointment time.   If you have a lab appointment with the Cancer Center please come in thru the Main Entrance and check in at the main information desk.  You need to re-schedule your appointment should you arrive 10 or more minutes late.  We strive to give you quality time with our providers, and arriving late affects you and other patients whose appointments are after yours.  Also, if you no show three or more times for appointments you may be dismissed from the clinic at the providers discretion.     Again, thank you for choosing [facility].  Our hope is that these requests will decrease the amount of time that you wait before being seen by our physicians.       _____________________________________________________________  Should you have questions after your visit to [facility], please contact our office at [phone] and follow the prompts.  Our office hours are 8:00 a.m. and 4:30 p.m. Monday - Friday.  Please note that voicemails left after 4:00 p.m. may not be returned until the following business day.  We are closed weekends and major holidays.  You do have access to a nurse 24-7, just call the main number to the clinic [phone] and do not press any options, hold on the line and a nurse will answer the phone.    For prescription refill requests, have your pharmacy contact our office and allow 72 hours.    Due to Covid, you will need to wear a mask upon entering  the hospital. If you do not have a mask, a mask will be given to you at the Main Entrance upon arrival. For doctor visits, patients may have 1 support person age 18 or older with them. For treatment visits, patients can not have anyone with them due to social distancing guidelines and our immunocompromised population.      

## 2023-03-17 NOTE — Progress Notes (Signed)
Patient has been examined by Dr. Katragadda. Vital signs and labs have been reviewed by MD - ANC, Creatinine, LFTs, hemoglobin, and platelets are within treatment parameters per M.D. - pt may proceed with treatment.  Primary RN and pharmacy notified.  

## 2023-03-17 NOTE — Progress Notes (Signed)
Patient presents today for chemotherapy infusion. Patient is in satisfactory condition with no new complaints voiced.  Vital signs are stable.  Labs reviewed by Dr. Katragadda during the office visit and all labs are within treatment parameters.  We will proceed with treatment per MD orders.   Patient tolerated treatment well with no complaints voiced.  Patient left ambulatory in stable condition.  Vital signs stable at discharge.  Follow up as scheduled.       

## 2023-03-17 NOTE — Patient Instructions (Signed)
[facility]  Discharge Instructions: Thank you for choosing [facility] to provide your oncology and hematology care.  If you have a lab appointment with the [facility] - please note that after April 8th, 2024, all labs will be drawn in the cancer center.  You do not have to check in or register with the main entrance as you have in the past but will complete your check-in in the cancer center.  Wear comfortable clothing and clothing appropriate for easy access to any Portacath or PICC line.   We strive to give you quality time with your provider. You may need to reschedule your appointment if you arrive late (15 or more minutes).  Arriving late affects you and other patients whose appointments are after yours.  Also, if you miss three or more appointments without notifying the office, you may be dismissed from the clinic at the provider's discretion.      For prescription refill requests, have your pharmacy contact our office and allow 72 hours for refills to be completed.    Today you received the following chemotherapy and/or immunotherapy agents Tecentriq.  Atezolizumab Injection What is this medication? ATEZOLIZUMAB (a te zoe LIZ ue mab) treats some types of cancer. It works by helping your immune system slow or stop the spread of cancer cells. It is a monoclonal antibody. This medicine may be used for other purposes; ask your health care provider or pharmacist if you have questions. COMMON BRAND NAME(S): Tecentriq What should I tell my care team before I take this medication? They need to know if you have any of these conditions: Allogeneic stem cell transplant (uses someone else's stem cells) Autoimmune diseases, such as Crohn disease, ulcerative colitis, lupus History of chest radiation Nervous system problems, such as Guillain-Barre syndrome, myasthenia gravis Organ transplant An unusual or allergic reaction to atezolizumab, other medications,  foods, dyes, or preservatives Pregnant or trying to get pregnant Breast-feeding How should I use this medication? This medication is injected into a vein. It is given by your care team in a hospital or clinic setting. A special MedGuide will be given to you before each treatment. Be sure to read this information carefully each time. Talk to your care team about the use of this medication in children. While it may be prescribed for children as young as 2 years for selected conditions, precautions do apply. Overdosage: If you think you have taken too much of this medicine contact a poison control center or emergency room at once. NOTE: This medicine is only for you. Do not share this medicine with others. What if I miss a dose? Keep appointments for follow-up doses. It is important not to miss your dose. Call your care team if you are unable to keep an appointment. What may interact with this medication? Interactions have not been studied. This list may not describe all possible interactions. Give your health care provider a list of all the medicines, herbs, non-prescription drugs, or dietary supplements you use. Also tell them if you smoke, drink alcohol, or use illegal drugs. Some items may interact with your medicine. What should I watch for while using this medication? Your condition will be monitored carefully while you are receiving this medication. You may need blood work while taking this medication. This medication may cause serious skin reactions. They can happen weeks to months after starting the medication. Contact your care team right away if you notice fevers or flu-like symptoms with a rash.  The rash may be red or purple and then turn into blisters or peeling of the skin. You may also notice a red rash with swelling of the face, lips, or lymph nodes in your neck or under your arms. Tell your care team right away if you have any change in your eyesight. Talk to your care team if you may  be pregnant. Serious birth defects can occur if you take this medication during pregnancy and for 5 months after the last dose. You will need a negative pregnancy test before starting this medication. Contraception is recommended while taking this medication and for 5 months after the last dose. Your care team can help you find the option that works for you. Do not breastfeed while taking this medication and for at least 5 months after the last dose. What side effects may I notice from receiving this medication? Side effects that you should report to your doctor or health care professional as soon as possible: Allergic reactions--skin rash, itching, hives, swelling of the face, lips, tongue, or throat Dry cough, shortness of breath or trouble breathing Eye pain, redness, irritation, or discharge with blurry or decreased vision Heart muscle inflammation--unusual weakness or fatigue, shortness of breath, chest pain, fast or irregular heartbeat, dizziness, swelling of the ankles, feet, or hands Hormone gland problems--headache, sensitivity to light, unusual weakness or fatigue, dizziness, fast or irregular heartbeat, increased sensitivity to cold or heat, excessive sweating, constipation, hair loss, increased thirst or amount of urine, tremors or shaking, irritability Infusion reactions--chest pain, shortness of breath or trouble breathing, feeling faint or lightheaded Kidney injury (glomerulonephritis)--decrease in the amount of urine, red or dark Amanda Steuart urine, foamy or bubbly urine, swelling of the ankles, hands, or feet Liver injury--right upper belly pain, loss of appetite, nausea, light-colored stool, dark yellow or Shenia Alan urine, yellowing skin or eyes, unusual weakness or fatigue Pain, tingling, or numbness in the hands or feet, muscle weakness, change in vision, confusion or trouble speaking, loss of balance or coordination, trouble walking, seizures Rash, fever, and swollen lymph nodes Redness,  blistering, peeling, or loosening of the skin, including inside the mouth Sudden or severe stomach pain, bloody diarrhea, fever, nausea, vomiting Side effects that usually do not require medical attention (report to your doctor or health care professional if they continue or are bothersome): Bone, joint, or muscle pain Diarrhea Fatigue Loss of appetite Nausea Skin rash This list may not describe all possible side effects. Call your doctor for medical advice about side effects. You may report side effects to FDA at 1-800-FDA-1088. Where should I keep my medication? This medication is given in a hospital or clinic. It will not be stored at home. NOTE: This sheet is a summary. It may not cover all possible information. If you have questions about this medicine, talk to your doctor, pharmacist, or health care provider.  2024 Elsevier/Gold Standard (2022-02-06 00:00:00)        To help prevent nausea and vomiting after your treatment, we encourage you to take your nausea medication as directed.  BELOW ARE SYMPTOMS THAT SHOULD BE REPORTED IMMEDIATELY: *FEVER GREATER THAN 100.4 F (38 C) OR HIGHER *CHILLS OR SWEATING *NAUSEA AND VOMITING THAT IS NOT CONTROLLED WITH YOUR NAUSEA MEDICATION *UNUSUAL SHORTNESS OF BREATH *UNUSUAL BRUISING OR BLEEDING *URINARY PROBLEMS (pain or burning when urinating, or frequent urination) *BOWEL PROBLEMS (unusual diarrhea, constipation, pain near the anus) TENDERNESS IN MOUTH AND THROAT WITH OR WITHOUT PRESENCE OF ULCERS (sore throat, sores in mouth, or a toothache) UNUSUAL  RASH, SWELLING OR PAIN  UNUSUAL VAGINAL DISCHARGE OR ITCHING   Items with * indicate a potential emergency and should be followed up as soon as possible or go to the Emergency Department if any problems should occur.  Please show the CHEMOTHERAPY ALERT CARD or IMMUNOTHERAPY ALERT CARD at check-in to the Emergency Department and triage nurse.  Should you have questions after your visit or  need to cancel or reschedule your appointment, please contact [facility] [phone]  and follow the prompts.  Office hours are 8:00 a.m. to 4:30 p.m. Monday - Friday. Please note that voicemails left after 4:00 p.m. may not be returned until the following business day.  We are closed weekends and major holidays. You have access to a nurse at all times for urgent questions. Please call the main number to the clinic [phone] and follow the prompts.  For any non-urgent questions, you may also contact your provider using MyChart. We now offer e-Visits for anyone 47 and older to request care online for non-urgent symptoms. For details visit [email or web address].   Also download the MyChart app! Go to the app store, search "MyChart", open the app, select [facility], and log in with your MyChart username and password.

## 2023-03-19 ENCOUNTER — Other Ambulatory Visit: Payer: Self-pay

## 2023-03-31 ENCOUNTER — Ambulatory Visit (HOSPITAL_COMMUNITY)
Admission: RE | Admit: 2023-03-31 | Discharge: 2023-03-31 | Disposition: A | Discharge status: Home / Self Care | Payer: PPO | Source: Ambulatory Visit | Attending: Hematology | Admitting: Hematology

## 2023-03-31 ENCOUNTER — Encounter (HOSPITAL_COMMUNITY): Payer: Self-pay | Admitting: Radiology

## 2023-03-31 DIAGNOSIS — I700 Atherosclerosis of aorta: Secondary | ICD-10-CM | POA: Diagnosis not present

## 2023-03-31 DIAGNOSIS — I3139 Other pericardial effusion (noninflammatory): Secondary | ICD-10-CM | POA: Diagnosis not present

## 2023-03-31 DIAGNOSIS — C3411 Malignant neoplasm of upper lobe, right bronchus or lung: Secondary | ICD-10-CM | POA: Insufficient documentation

## 2023-03-31 MED ORDER — IOHEXOL 300 MG/ML  SOLN
100.0000 mL | Freq: Once | INTRAMUSCULAR | Status: AC | PRN
  Administered 2023-03-31: 100 mL via INTRAVENOUS

## 2023-03-31 MED ORDER — HEPARIN SOD (PORK) LOCK FLUSH 100 UNIT/ML IV SOLN
500.0000 [IU] | Freq: Once | INTRAVENOUS | Status: AC
  Administered 2023-03-31: 500 [IU] via INTRAVENOUS

## 2023-03-31 MED ORDER — HEPARIN SOD (PORK) LOCK FLUSH 100 UNIT/ML IV SOLN
INTRAVENOUS | Status: AC
  Filled 2023-03-31: qty 5

## 2023-04-02 ENCOUNTER — Ambulatory Visit (HOSPITAL_COMMUNITY)
Admission: RE | Admit: 2023-04-02 | Discharge: 2023-04-02 | Disposition: A | Discharge status: Home / Self Care | Payer: PPO | Source: Ambulatory Visit | Attending: Hematology | Admitting: Hematology

## 2023-04-02 DIAGNOSIS — C3490 Malignant neoplasm of unspecified part of unspecified bronchus or lung: Secondary | ICD-10-CM | POA: Diagnosis not present

## 2023-04-02 DIAGNOSIS — C3411 Malignant neoplasm of upper lobe, right bronchus or lung: Secondary | ICD-10-CM | POA: Diagnosis not present

## 2023-04-02 DIAGNOSIS — R9089 Other abnormal findings on diagnostic imaging of central nervous system: Secondary | ICD-10-CM | POA: Diagnosis not present

## 2023-04-02 MED ORDER — GADOBUTROL 1 MMOL/ML IV SOLN
10.0000 mL | Freq: Once | INTRAVENOUS | Status: AC | PRN
  Administered 2023-04-02: 10 mL via INTRAVENOUS

## 2023-04-02 MED ORDER — HEPARIN SOD (PORK) LOCK FLUSH 100 UNIT/ML IV SOLN
500.0000 [IU] | INTRAVENOUS | Status: AC | PRN
  Administered 2023-04-02: 500 [IU]
  Filled 2023-04-02: qty 5

## 2023-04-05 ENCOUNTER — Other Ambulatory Visit: Payer: Self-pay

## 2023-04-05 DIAGNOSIS — C3411 Malignant neoplasm of upper lobe, right bronchus or lung: Secondary | ICD-10-CM

## 2023-04-06 ENCOUNTER — Inpatient Hospital Stay (HOSPITAL_BASED_OUTPATIENT_CLINIC_OR_DEPARTMENT_OTHER): Payer: PPO | Admitting: Hematology

## 2023-04-06 ENCOUNTER — Inpatient Hospital Stay: Payer: PPO

## 2023-04-06 ENCOUNTER — Inpatient Hospital Stay: Payer: PPO | Attending: Hematology

## 2023-04-06 ENCOUNTER — Encounter: Payer: Self-pay | Admitting: Hematology

## 2023-04-06 VITALS — BP 116/69 | HR 71 | Temp 97.9°F | Resp 18 | Wt 225.4 lb

## 2023-04-06 VITALS — BP 101/74 | HR 70 | Temp 97.5°F | Resp 18

## 2023-04-06 DIAGNOSIS — Z5112 Encounter for antineoplastic immunotherapy: Secondary | ICD-10-CM | POA: Insufficient documentation

## 2023-04-06 DIAGNOSIS — C3411 Malignant neoplasm of upper lobe, right bronchus or lung: Secondary | ICD-10-CM

## 2023-04-06 DIAGNOSIS — E876 Hypokalemia: Secondary | ICD-10-CM | POA: Diagnosis not present

## 2023-04-06 DIAGNOSIS — F17210 Nicotine dependence, cigarettes, uncomplicated: Secondary | ICD-10-CM | POA: Insufficient documentation

## 2023-04-06 DIAGNOSIS — Z79620 Long term (current) use of immunosuppressive biologic: Secondary | ICD-10-CM | POA: Diagnosis not present

## 2023-04-06 DIAGNOSIS — E279 Disorder of adrenal gland, unspecified: Secondary | ICD-10-CM | POA: Insufficient documentation

## 2023-04-06 LAB — COMPREHENSIVE METABOLIC PANEL
ALT: 9 U/L (ref 0–44)
AST: 10 U/L — ABNORMAL LOW (ref 15–41)
Albumin: 3.5 g/dL (ref 3.5–5.0)
Alkaline Phosphatase: 70 U/L (ref 38–126)
Anion gap: 6 (ref 5–15)
BUN: 13 mg/dL (ref 8–23)
CO2: 31 mmol/L (ref 22–32)
Calcium: 8.1 mg/dL — ABNORMAL LOW (ref 8.9–10.3)
Chloride: 99 mmol/L (ref 98–111)
Creatinine, Ser: 0.85 mg/dL (ref 0.61–1.24)
GFR, Estimated: >60 mL/min (ref >60)
Glucose, Bld: 116 mg/dL — ABNORMAL HIGH (ref 70–99)
Potassium: 3.5 mmol/L (ref 3.5–5.1)
Sodium: 136 mmol/L (ref 135–145)
Total Bilirubin: 0.7 mg/dL (ref 0.3–1.2)
Total Protein: 6.7 g/dL (ref 6.5–8.1)

## 2023-04-06 LAB — CBC WITH DIFFERENTIAL/PLATELET
Abs Immature Granulocytes: 0.04 10*3/uL (ref 0.00–0.07)
Basophils Absolute: 0 10*3/uL (ref 0.0–0.1)
Basophils Relative: 1 %
Eosinophils Absolute: 0.3 10*3/uL (ref 0.0–0.5)
Eosinophils Relative: 4 %
HCT: 47 % (ref 39.0–52.0)
Hemoglobin: 15.8 g/dL (ref 13.0–17.0)
Immature Granulocytes: 1 %
Lymphocytes Relative: 12 %
Lymphs Abs: 0.7 10*3/uL (ref 0.7–4.0)
MCH: 31.2 pg (ref 26.0–34.0)
MCHC: 33.6 g/dL (ref 30.0–36.0)
MCV: 92.7 fL (ref 80.0–100.0)
Monocytes Absolute: 0.5 10*3/uL (ref 0.1–1.0)
Monocytes Relative: 7 %
Neutro Abs: 4.9 10*3/uL (ref 1.7–7.7)
Neutrophils Relative %: 75 %
Platelets: 177 10*3/uL (ref 150–400)
RBC: 5.07 MIL/uL (ref 4.22–5.81)
RDW: 14.2 % (ref 11.5–15.5)
WBC: 6.5 10*3/uL (ref 4.0–10.5)
nRBC: 0 % (ref 0.0–0.2)

## 2023-04-06 LAB — TSH: TSH: 1.795 u[IU]/mL (ref 0.350–4.500)

## 2023-04-06 LAB — MAGNESIUM: Magnesium: 1.9 mg/dL (ref 1.7–2.4)

## 2023-04-06 MED ORDER — HEPARIN SOD (PORK) LOCK FLUSH 100 UNIT/ML IV SOLN
500.0000 [IU] | Freq: Once | INTRAVENOUS | Status: AC | PRN
  Administered 2023-04-06: 500 [IU]

## 2023-04-06 MED ORDER — SODIUM CHLORIDE 0.9 % IV SOLN: Freq: Once | INTRAVENOUS | Status: AC

## 2023-04-06 MED ORDER — SODIUM CHLORIDE 0.9 % IV SOLN
1200.0000 mg | Freq: Once | INTRAVENOUS | Status: AC
  Administered 2023-04-06: 1200 mg via INTRAVENOUS
  Filled 2023-04-06: qty 20

## 2023-04-06 MED ORDER — SODIUM CHLORIDE 0.9% FLUSH
10.0000 mL | INTRAVENOUS | Status: DC | PRN
  Administered 2023-04-06: 10 mL

## 2023-04-06 NOTE — Progress Notes (Signed)
Labs reviewed with MD at office visit today. Ok to treat per MD

## 2023-04-06 NOTE — Patient Instructions (Signed)
[facility]  Discharge Instructions: Thank you for choosing [facility] to provide your oncology and hematology care.  If you have a lab appointment with the Cancer Center - please note that after April 8th, 2024, all labs will be drawn in the cancer center.  You do not have to check in or register with the main entrance as you have in the past but will complete your check-in in the cancer center.  Wear comfortable clothing and clothing appropriate for easy access to any Portacath or PICC line.   We strive to give you quality time with your provider. You may need to reschedule your appointment if you arrive late (15 or more minutes).  Arriving late affects you and other patients whose appointments are after yours.  Also, if you miss three or more appointments without notifying the office, you may be dismissed from the clinic at the provider's discretion.      For prescription refill requests, have your pharmacy contact our office and allow 72 hours for refills to be completed.    Today you received the following chemotherapy and/or immunotherapy agents Tecentriq      To help prevent nausea and vomiting after your treatment, we encourage you to take your nausea medication as directed.  BELOW ARE SYMPTOMS THAT SHOULD BE REPORTED IMMEDIATELY: *FEVER GREATER THAN 100.4 F (38 C) OR HIGHER *CHILLS OR SWEATING *NAUSEA AND VOMITING THAT IS NOT CONTROLLED WITH YOUR NAUSEA MEDICATION *UNUSUAL SHORTNESS OF BREATH *UNUSUAL BRUISING OR BLEEDING *URINARY PROBLEMS (pain or burning when urinating, or frequent urination) *BOWEL PROBLEMS (unusual diarrhea, constipation, pain near the anus) TENDERNESS IN MOUTH AND THROAT WITH OR WITHOUT PRESENCE OF ULCERS (sore throat, sores in mouth, or a toothache) UNUSUAL RASH, SWELLING OR PAIN  UNUSUAL VAGINAL DISCHARGE OR ITCHING   Items with * indicate a potential emergency and should be followed up as soon as possible or go to the  Emergency Department if any problems should occur.  Please show the CHEMOTHERAPY ALERT CARD or IMMUNOTHERAPY ALERT CARD at check-in to the Emergency Department and triage nurse.  Should you have questions after your visit or need to cancel or reschedule your appointment, please contact [facility] [phone]  and follow the prompts.  Office hours are 8:00 a.m. to 4:30 p.m. Monday - Friday. Please note that voicemails left after 4:00 p.m. may not be returned until the following business day.  We are closed weekends and major holidays. You have access to a nurse at all times for urgent questions. Please call the main number to the clinic [phone] and follow the prompts.  For any non-urgent questions, you may also contact your provider using MyChart. We now offer e-Visits for anyone 18 and older to request care online for non-urgent symptoms. For details visit [email or web address].   Also download the MyChart app! Go to the app store, search "MyChart", open the app, select [facility], and log in with your MyChart username and password.   

## 2023-04-06 NOTE — Patient Instructions (Signed)
[facility]  Discharge Instructions  You were seen and examined today by Dr. Ellin Saba.  Your CT scan is stable. Proceed with treatment as planned.  Follow-up as scheduled.  Thank you for choosing  [facility] to provide your oncology and hematology care.   To afford each patient quality time with our provider, please arrive at least 15 minutes before your scheduled appointment time. You may need to reschedule your appointment if you arrive late (10 or more minutes). Arriving late affects you and other patients whose appointments are after yours.  Also, if you miss three or more appointments without notifying the office, you may be dismissed from the clinic at the provider's discretion.    Again, thank you for choosing [facility].  Our hope is that these requests will decrease the amount of time that you wait before being seen by our physicians.   If you have a lab appointment with the Cancer Center - please note that after April 8th, all labs will be drawn in the cancer center.  You do not have to check in or register with the main entrance as you have in the past but will complete your check-in at the cancer center.            _____________________________________________________________  Should you have questions after your visit to [facility], please contact our office at [phone] and follow the prompts.  Our office hours are 8:00 a.m. to 4:30 p.m. Monday - Thursday and 8:00 a.m. to 2:30 p.m. Friday.  Please note that voicemails left after 4:00 p.m. may not be returned until the following business day.  We are closed weekends and all major holidays.  You do have access to a nurse 24-7, just call the main number to the clinic [phone] and do not press any options, hold on the line and a nurse will answer the phone.    For prescription refill requests, have your pharmacy contact our office and allow 72  hours.    Masks are no longer required in the cancer centers. If you would like for your care team to wear a mask while they are taking care of you, please let them know. You may have one support person who is at least 72 years old accompany you for your appointments.

## 2023-04-06 NOTE — Progress Notes (Signed)
[facility] [address], [place] [address]    Clinic Day:  04/06/2023  Referring physician: Elfredia Nevins, MD  Patient Care Team: Elfredia Nevins, MD as PCP - General (Internal Medicine) Doreatha Massed, MD as Medical Oncologist (Medical Oncology) Therese Sarah, RN as Oncology Nurse Navigator (Medical Oncology)   ASSESSMENT & PLAN:   Assessment: 1. Extensive stage small cell lung cancer: - Productive cough for 3 weeks with hemoptysis.  Hemoptysis improved after Eliquis stopped 2 weeks ago.  Still has some purulent sputum.  Currently on Levaquin. - Abnormal chest x-ray on 01/19/2022 for productive cough - CT chest with contrast (01/21/2022): Right hilar mass measuring 6.5 x 5.2 cm surrounding and compressing the right upper lobe bronchus, bronchus intermedius, right middle lobe bronchus and right lower lobe bronchus centrally.  Compression of the right lower lobe pulmonary artery.  Nodular soft tissue lesion in the right lower lobe continuous with right hilar mass and could represent endobronchial spread.  Enlarged mediastinal lymph nodes but no findings of pulmonary metastatic disease.  Indeterminate bilateral adrenal gland nodules and low attenuation liver lesions, likely benign cyst. - No weight loss.  No chest pains. - PET scan: Right hilar mass involving upper lobe, middle lobe and lower lobe with satellite nodularity in the right lower lobe, hypermetabolic subcarinal and right paratracheal lymphadenopathy.  Pleural-based nodule along the right posterior costophrenic angle.  Hypermetabolic left adrenal lesion.  Small hypermetabolic lesions in the humerus bilaterally and a subcutaneous focus in the right posterior lower back. - Right lung hilar mass biopsy (01/29/2022): Small cell carcinoma -4 cycles of carboplatin, etoposide and atezolizumab from 02/23/2022 through 04/29/2022, maintenance Atezolizumab ongoing   2. Social/family history: - He lives at home  with his wife [name].  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  Smoked 1 pack/day for 50 years. - Father had renal cell carcinoma.    Plan: Extensive stage small cell lung cancer: - He does not report any immunotherapy related side effects. - CT chest (03/30/2021): Posterior right upper lobe nodularity improved.  Progressive right upper lobe groundglass opacity nonspecific.  No evidence of recurrence or metastatic disease. - Reviewed labs today: Normal LFTs.  CBC grossly normal.  TSH 1.7. - Recommend continuing Atezolizumab every 3 weeks.  RTC 9 weeks for follow-up. - We will follow-up on the brain MRI results.  2.  Difficulty sleeping: - He is taking some THC supplements which are helping.  3.  Hypokalemia: - Continue potassium supplements.  Potassium is normal today.    Orders Placed This Encounter  Procedures   CBC with Differential    Standing Status:   Future    Standing Expiration Date:   06/08/2024   Comprehensive metabolic panel    Standing Status:   Future    Standing Expiration Date:   06/08/2024   CBC with Differential    Standing Status:   Future    Standing Expiration Date:   06/29/2024   Comprehensive metabolic panel    Standing Status:   Future    Standing Expiration Date:   06/29/2024   CBC with Differential    Standing Status:   Future    Standing Expiration Date:   07/20/2024   Comprehensive metabolic panel    Standing Status:   Future    Standing Expiration Date:   07/20/2024   CBC with Differential    Standing Status:   Future    Standing Expiration Date:  08/10/2024   Comprehensive metabolic panel    Standing Status:   Future    Standing Expiration Date:   08/10/2024   CBC with Differential    Standing Status:   Future    Standing Expiration Date:   08/31/2024   Comprehensive metabolic panel    Standing Status:   Future    Standing Expiration Date:   08/31/2024   CBC with Differential    Standing Status:   Future     Standing Expiration Date:   09/21/2024   Comprehensive metabolic panel    Standing Status:   Future    Standing Expiration Date:   09/21/2024   CBC with Differential    Standing Status:   Future    Standing Expiration Date:   10/12/2024   Comprehensive metabolic panel    Standing Status:   Future    Standing Expiration Date:   10/12/2024      I,Katie Daubenspeck,acting as a scribe for Doreatha Massed, MD.,have documented all relevant documentation on the behalf of Doreatha Massed, MD,as directed by  Doreatha Massed, MD while in the presence of Doreatha Massed, MD.   I, Doreatha Massed MD, have reviewed the above documentation for accuracy and completeness, and I agree with the above.   Doreatha Massed, MD   7/2/20246:12 PM  CHIEF COMPLAINT:   Diagnosis: extensive stage small cell lung cancer    Cancer Staging  Small cell lung cancer, right upper lobe [facility]) Staging form: Lung, AJCC 8th Edition - Clinical stage from 01/22/2022: Stage IVB (cT4, cN2, pM1c) - Signed by Doreatha Massed, MD on 02/16/2022    Prior Therapy: 4 cycles of carboplatin, etoposide and Atezolizumab completed on 04/29/2022   Current Therapy:  Maintenance Atezolizumab every 3 weeks    HISTORY OF PRESENT ILLNESS:   Oncology History  Small cell lung cancer, right upper lobe (HCC)  01/22/2022 Initial Diagnosis   Small cell lung cancer, right upper lobe (HCC)   01/22/2022 Cancer Staging   Staging form: Lung, AJCC 8th Edition - Clinical stage from 01/22/2022: Stage IVB (cT4, cN2, pM1c) - Signed by Doreatha Massed, MD on 02/16/2022 Histopathologic type: Small cell carcinoma, NOS Stage prefix: Initial diagnosis   02/23/2022 - 05/25/2022 Chemotherapy   Patient is on Treatment Plan : LUNG SCLC Carboplatin + Etoposide + Atezolizumab Induction q21d / Atezolizumab Maintenance q21d     02/23/2022 -  Chemotherapy   Patient is on Treatment Plan : LUNG SCLC Carboplatin + Etoposide +  Atezolizumab Induction q21d x 4 cycles / Atezolizumab Maintenance q21d        INTERVAL HISTORY:   Larry Moore is a 72 y.o. male presenting to clinic today for follow up of extensive stage small cell lung cancer. He was last seen by me on 03/17/23.  Since his last visit, he underwent restaging chest CT on 03/31/23 showing: no specific findings to suggest metastatic disease; improvement to posterior RUL nodularity; progressive nonspecific RUL ground-glass opacity; pulmonary artery enlargement; similar bilateral adrenal nodularity and small pericardial effusion.  He also underwent repeat brain MRI on 04/02/23.   Today, he states that he is doing well overall. His appetite level is at 75%. His energy level is at 75%.  PAST MEDICAL HISTORY:   Past Medical History: Past Medical History:  Diagnosis Date   Dyspnea    Dysrhythmia    hx a-fib   Hypertension    Insomnia    Pre-diabetes    Sleep apnea     Surgical History: Past Surgical History:  Procedure Laterality Date   ATRIAL FIBRILLATION ABLATION     approx 2020   CARDIAC CATHETERIZATION     HERNIA REPAIR     IR IMAGING GUIDED PORT INSERTION  02/17/2022   IR VENO/JUGULAR RIGHT  02/17/2022   VIDEO BRONCHOSCOPY WITH ENDOBRONCHIAL ULTRASOUND Bilateral 01/29/2022   Procedure: VIDEO BRONCHOSCOPY WITH ENDOBRONCHIAL ULTRASOUND;  Surgeon: Josephine Igo, DO;  Location: [facility] OR;  Service: Cardiopulmonary;  Laterality: Bilateral;  w/ Guardant [id number]    Social History: Social History   Socioeconomic History   Marital status: Married    Spouse name: Not on file   Number of children: Not on file   Years of education: Not on file   Highest education level: Not on file  Occupational History   Occupation: Retired  Tobacco Use   Smoking status: Every Day    Types: Cigarettes    Passive exposure: Current   Smokeless tobacco: Not on file   Tobacco comments:    Pt smokes 1 ppd. 01/26/22  Vaping Use   Vaping Use: Never used  Substance and  Sexual Activity   Alcohol use: Not Currently    Comment: 1 bottle wine a day    Drug use: No   Sexual activity: Not on file  Other Topics Concern   Not on file  Social History Narrative   Not on file   Social Determinants of Health   Financial Resource Strain: Low Risk  (12/16/2022)   Overall Financial Resource Strain (CARDIA)    Difficulty of Paying Living Expenses: Not hard at all  Food Insecurity: No Food Insecurity (12/16/2022)   Hunger Vital Sign    Worried About Running Out of Food in the Last Year: Never true    Ran Out of Food in the Last Year: Never true  Transportation Needs: No Transportation Needs (12/16/2022)   PRAPARE - Administrator, Civil Service (Medical): No    Lack of Transportation (Non-Medical): No  Physical Activity: Not on file  Stress: Not on file  Social Connections: Not on file  Intimate Partner Violence: Not on file    Family History: Family History  Problem Relation Age of Onset   Coronary artery disease Father    Diabetes Father     Current Medications:  Current Outpatient Medications:    allopurinol (ZYLOPRIM) 300 MG tablet, Take 600 mg by mouth daily., Disp: , Rfl:    alprazolam (XANAX) 2 MG tablet, Take 1 tablet (2 mg total) by mouth at bedtime as needed for anxiety., Disp: 30 tablet, Rfl: 3   amoxicillin (AMOXIL) 500 MG capsule, Take 500 mg by mouth every 8 (eight) hours., Disp: , Rfl:    Atezolizumab (TECENTRIQ IV), Inject into the vein every 21 ( twenty-one) days., Disp: , Rfl:    furosemide (LASIX) 80 MG tablet, Take 80 mg by mouth daily., Disp: , Rfl:    lidocaine-prilocaine (EMLA) cream, Apply topically as directed., Disp: , Rfl:    metolazone (ZAROXOLYN) 2.5 MG tablet, Take 2.5 mg by mouth daily., Disp: , Rfl:    potassium chloride (KLOR-CON M) 10 MEQ tablet, Take 1 tablet (10 mEq total) by mouth 2 (two) times daily., Disp: 180 tablet, Rfl: 3   sotalol (BETAPACE) 80 MG tablet, SMARTSIG:1 Tablet(s) By Mouth Every 12 Hours,  Disp: , Rfl:    Vitamin D, Ergocalciferol, (DRISDOL) 1.25 MG (50000 UNIT) CAPS capsule, Take 50,000 Units by mouth daily., Disp: , Rfl:  No current facility-administered medications for this visit.  Facility-Administered Medications  Ordered in Other Visits:    sodium chloride flush (NS) 0.9 % injection 10 mL, 10 mL, Intracatheter, PRN, Doreatha Massed, MD, 10 mL at 04/06/23 1443   Allergies: No Known Allergies  REVIEW OF SYSTEMS:   Review of Systems  Constitutional:  Positive for fatigue. Negative for chills and fever.  HENT:   Negative for lump/mass, mouth sores, nosebleeds, sore throat and trouble swallowing.   Eyes:  Negative for eye problems.  Respiratory:  Negative for cough and shortness of breath.   Cardiovascular:  Negative for chest pain, leg swelling and palpitations.  Gastrointestinal:  Negative for abdominal pain, constipation, diarrhea, nausea and vomiting.  Genitourinary:  Negative for bladder incontinence, difficulty urinating, dysuria, frequency, hematuria and nocturia.   Musculoskeletal:  Negative for arthralgias, back pain, flank pain, myalgias and neck pain.  Skin:  Negative for itching and rash.  Neurological:  Negative for dizziness, headaches and numbness.  Hematological:  Does not bruise/bleed easily.  Psychiatric/Behavioral:  Negative for depression, sleep disturbance and suicidal ideas. The patient is not nervous/anxious.   All other systems reviewed and are negative.    VITALS:   Blood pressure 116/69, pulse 71, temperature 97.9 F (36.6 C), resp. rate 18, weight 225 lb 6.4 oz (102.2 kg), SpO2 97 %.  Wt Readings from Last 3 Encounters:  04/06/23 225 lb 6.4 oz (102.2 kg)  03/17/23 229 lb 12.8 oz (104.2 kg)  02/23/23 225 lb 6.4 oz (102.2 kg)    Body mass index is 34.27 kg/m.  Performance status (ECOG): 1 - Symptomatic but completely ambulatory  PHYSICAL EXAM:   Physical Exam Vitals and nursing note reviewed. Exam conducted with a chaperone  present.  Constitutional:      Appearance: Normal appearance.  Cardiovascular:     Rate and Rhythm: Normal rate and regular rhythm.     Pulses: Normal pulses.     Heart sounds: Normal heart sounds.  Pulmonary:     Effort: Pulmonary effort is normal.     Breath sounds: Normal breath sounds.  Abdominal:     Palpations: Abdomen is soft. There is no hepatomegaly, splenomegaly or mass.     Tenderness: There is no abdominal tenderness.  Musculoskeletal:     Right lower leg: No edema.     Left lower leg: No edema.  Lymphadenopathy:     Cervical: No cervical adenopathy.     Right cervical: No superficial, deep or posterior cervical adenopathy.    Left cervical: No superficial, deep or posterior cervical adenopathy.     Upper Body:     Right upper body: No supraclavicular or axillary adenopathy.     Left upper body: No supraclavicular or axillary adenopathy.  Neurological:     General: No focal deficit present.     Mental Status: He is alert and oriented to person, place, and time.  Psychiatric:        Mood and Affect: Mood normal.        Behavior: Behavior normal.     LABS:      Latest Ref Rng & Units 04/06/2023   12:05 PM 03/17/2023   10:55 AM 02/23/2023   11:00 AM  CBC  WBC 4.0 - 10.5 K/uL 6.5  7.6  8.6   Hemoglobin 13.0 - 17.0 g/dL 84.6  96.2  95.2   Hematocrit 39.0 - 52.0 % 47.0  45.7  49.3   Platelets 150 - 400 K/uL 177  218  178       Latest Ref Rng & Units  04/06/2023   12:05 PM 03/17/2023   10:55 AM 02/23/2023   11:00 AM  CMP  Glucose 70 - 99 mg/dL 782  956  86   BUN 8 - 23 mg/dL 13  10  21    Creatinine 0.61 - 1.24 mg/dL 2.13  0.86  5.78   Sodium 135 - 145 mmol/L 136  135  136   Potassium 3.5 - 5.1 mmol/L 3.5  3.9  3.4   Chloride 98 - 111 mmol/L 99  102  96   CO2 22 - 32 mmol/L 31  25  31    Calcium 8.9 - 10.3 mg/dL 8.1  8.1  8.2   Total Protein 6.5 - 8.1 g/dL 6.7  6.6  7.0   Total Bilirubin 0.3 - 1.2 mg/dL 0.7  1.1  1.1   Alkaline Phos 38 - 126 U/L 70  71  69    AST 15 - 41 U/L 10  14  11    ALT 0 - 44 U/L 9  10  10       No results found for: "CEA1", "CEA" / No results found for: "CEA1", "CEA" No results found for: "PSA1" No results found for: "ION629" No results found for: "CAN125"  No results found for: "TOTALPROTELP", "ALBUMINELP", "A1GS", "A2GS", "BETS", "BETA2SER", "GAMS", "MSPIKE", "SPEI" No results found for: "TIBC", "FERRITIN", "IRONPCTSAT" No results found for: "LDH"   STUDIES:   CT Chest W Contrast  Result Date: 04/05/2023 CLINICAL DATA:  Non-small-cell lung cancer.  * Tracking Code: BO * EXAM: CT CHEST WITH CONTRAST TECHNIQUE: Multidetector CT imaging of the chest was performed during intravenous contrast administration. RADIATION DOSE REDUCTION: This exam was performed according to the departmental dose-optimization program which includes automated exposure control, adjustment of the mA and/or kV according to patient size and/or use of iterative reconstruction technique. CONTRAST:  OMNIPAQUE IOHEXOL 300 MG/ML  SOLN COMPARISON:  02/01/2023 FINDINGS: Cardiovascular: Left Port-A-Cath tip at high right atrium. Aortic atherosclerosis. Tortuous descending thoracic aorta. Mild cardiomegaly with lipomatous hypertrophy of the interatrial septum. Three vessel coronary artery calcification. Minimal pericardial fluid, similar. Pulmonary artery enlargement, outflow tract 3.3 cm. No central pulmonary embolism, on this non-dedicated study. Mediastinum/Nodes: No supraclavicular adenopathy. No mediastinal or hilar adenopathy. Again identified is presumed extramedullary hematopoiesis in the lower thoracic paraspinous space bilaterally on 112/2. Lungs/Pleura: No pleural fluid.  Mild centrilobular emphysema. Areas of posterior right upper lobe nodularity are improved and less well-defined today. Example nodule on the right major fissure at 3 mm on 64/4 versus 5 mm on the prior exam (when remeasured). The more lateral posterior right upper lobe nodularity is  less well-defined today, now ground-glass in morphology including on 66/4. There is also similar mild subpleural predominant superior segment right lower lobe ground-glass. Slight increase in inferior posterior right upper lobe ground-glass including on 76/4. Upper Abdomen: Small bilateral hepatic cysts. Normal imaged portions of the spleen, pancreas, gallbladder, kidneys. Proximal gastric underdistention. Left-greater-than-right adrenal nodularity is unchanged. Including at maximally 1.3 cm on the left. Musculoskeletal: Mild bilateral gynecomastia. 9th posterolateral left rib remote fracture. IMPRESSION: 1. No specific findings to suggest metastatic disease. 2. The posterior right upper lobe nodularity is improved, less well-defined today. There is progressive right upper lobe ground-glass opacity which is nonspecific. Presuming no radiation therapy in this region, drug toxicity would be a consideration. 3. Aortic atherosclerosis (ICD10-I70.0), coronary artery atherosclerosis and emphysema (ICD10-J43.9). 4. Pulmonary artery enlargement suggests pulmonary arterial hypertension. 5. Similar bilateral adrenal nodularity 6. Similar small pericardial effusion. Electronically Signed  By: Jeronimo Greaves M.D.   On: 04/05/2023 08:36

## 2023-04-06 NOTE — Progress Notes (Signed)
Patient has been assessed, vital signs and labs have been reviewed by Dr. Katragadda. ANC, Creatinine, LFTs, and Platelets are within treatment parameters per Dr. Katragadda. The patient is good to proceed with treatment at this time. Primary RN and pharmacy aware.  

## 2023-04-06 NOTE — Progress Notes (Signed)
Patient presents today for Tecintriq infusion per rpoviders order.  Vital signs and labs reviewed by MD.  Message received from Chapman Moss RN/Dr. Ellin Saba patient okay for treatment.  Treatment given today per MD orders.  Stable during infusion without adverse affects.  Vital signs stable.  No complaints at this time.  Discharge from clinic ambulatory in stable condition.  Alert and oriented X 3.  Follow up with [facility] as scheduled.

## 2023-04-07 ENCOUNTER — Other Ambulatory Visit: Payer: Self-pay

## 2023-04-12 ENCOUNTER — Encounter (HOSPITAL_COMMUNITY): Payer: Self-pay | Admitting: Hematology

## 2023-04-12 ENCOUNTER — Encounter: Payer: Self-pay | Admitting: Hematology

## 2023-04-26 ENCOUNTER — Other Ambulatory Visit: Payer: Self-pay

## 2023-04-27 ENCOUNTER — Inpatient Hospital Stay: Payer: PPO

## 2023-04-27 ENCOUNTER — Inpatient Hospital Stay: Payer: PPO | Admitting: Hematology

## 2023-04-27 VITALS — BP 112/65 | HR 65 | Temp 98.3°F | Resp 17 | Wt 228.4 lb

## 2023-04-27 VITALS — BP 111/71 | HR 66 | Temp 97.9°F | Resp 18

## 2023-04-27 DIAGNOSIS — Z5112 Encounter for antineoplastic immunotherapy: Secondary | ICD-10-CM | POA: Diagnosis not present

## 2023-04-27 DIAGNOSIS — Z95828 Presence of other vascular implants and grafts: Secondary | ICD-10-CM

## 2023-04-27 DIAGNOSIS — C3411 Malignant neoplasm of upper lobe, right bronchus or lung: Secondary | ICD-10-CM

## 2023-04-27 DIAGNOSIS — E876 Hypokalemia: Secondary | ICD-10-CM

## 2023-04-27 LAB — CBC WITH DIFFERENTIAL/PLATELET
Abs Immature Granulocytes: 0.05 10*3/uL (ref 0.00–0.07)
Basophils Absolute: 0 10*3/uL (ref 0.0–0.1)
Basophils Relative: 1 %
Eosinophils Absolute: 0.2 10*3/uL (ref 0.0–0.5)
Eosinophils Relative: 4 %
HCT: 45.6 % (ref 39.0–52.0)
Hemoglobin: 15.4 g/dL (ref 13.0–17.0)
Immature Granulocytes: 1 %
Lymphocytes Relative: 15 %
Lymphs Abs: 1 10*3/uL (ref 0.7–4.0)
MCH: 31.6 pg (ref 26.0–34.0)
MCHC: 33.8 g/dL (ref 30.0–36.0)
MCV: 93.4 fL (ref 80.0–100.0)
Monocytes Absolute: 0.4 10*3/uL (ref 0.1–1.0)
Monocytes Relative: 6 %
Neutro Abs: 4.7 10*3/uL (ref 1.7–7.7)
Neutrophils Relative %: 73 %
Platelets: 199 10*3/uL (ref 150–400)
RBC: 4.88 MIL/uL (ref 4.22–5.81)
RDW: 14.2 % (ref 11.5–15.5)
WBC: 6.4 10*3/uL (ref 4.0–10.5)
nRBC: 0 % (ref 0.0–0.2)

## 2023-04-27 LAB — COMPREHENSIVE METABOLIC PANEL
ALT: 9 U/L (ref 0–44)
AST: 13 U/L — ABNORMAL LOW (ref 15–41)
Albumin: 3.4 g/dL — ABNORMAL LOW (ref 3.5–5.0)
Alkaline Phosphatase: 70 U/L (ref 38–126)
Anion gap: 10 (ref 5–15)
BUN: 12 mg/dL (ref 8–23)
CO2: 27 mmol/L (ref 22–32)
Calcium: 7.9 mg/dL — ABNORMAL LOW (ref 8.9–10.3)
Chloride: 99 mmol/L (ref 98–111)
Creatinine, Ser: 0.84 mg/dL (ref 0.61–1.24)
GFR, Estimated: >60 mL/min (ref >60)
Glucose, Bld: 161 mg/dL — ABNORMAL HIGH (ref 70–99)
Potassium: 3.4 mmol/L — ABNORMAL LOW (ref 3.5–5.1)
Sodium: 136 mmol/L (ref 135–145)
Total Bilirubin: 0.7 mg/dL (ref 0.3–1.2)
Total Protein: 6.5 g/dL (ref 6.5–8.1)

## 2023-04-27 LAB — TSH: TSH: 0.715 u[IU]/mL (ref 0.350–4.500)

## 2023-04-27 LAB — MAGNESIUM: Magnesium: 1.8 mg/dL (ref 1.7–2.4)

## 2023-04-27 MED ORDER — POTASSIUM CHLORIDE CRYS ER 20 MEQ PO TBCR: 40.0000 meq | EXTENDED_RELEASE_TABLET | Freq: Two times a day (BID) | ORAL | Status: DC

## 2023-04-27 MED ORDER — SODIUM CHLORIDE 0.9% FLUSH
10.0000 mL | INTRAVENOUS | Status: DC | PRN
  Administered 2023-04-27: 10 mL

## 2023-04-27 MED ORDER — SODIUM CHLORIDE 0.9 % IV SOLN: Freq: Once | INTRAVENOUS | Status: AC

## 2023-04-27 MED ORDER — HEPARIN SOD (PORK) LOCK FLUSH 100 UNIT/ML IV SOLN
500.0000 [IU] | Freq: Once | INTRAVENOUS | Status: AC | PRN
  Administered 2023-04-27: 500 [IU]

## 2023-04-27 MED ORDER — SODIUM CHLORIDE 0.9% FLUSH
10.0000 mL | INTRAVENOUS | Status: DC | PRN
  Administered 2023-04-27: 10 mL via INTRAVENOUS

## 2023-04-27 MED ORDER — SODIUM CHLORIDE 0.9 % IV SOLN
1200.0000 mg | Freq: Once | INTRAVENOUS | Status: AC
  Administered 2023-04-27: 1200 mg via INTRAVENOUS
  Filled 2023-04-27: qty 20

## 2023-04-27 NOTE — Patient Instructions (Signed)
[facility]  Discharge Instructions: Thank you for choosing [facility] to provide your oncology and hematology care.  If you have a lab appointment with the Cancer Center - please note that after April 8th, 2024, all labs will be drawn in the cancer center.  You do not have to check in or register with the main entrance as you have in the past but will complete your check-in in the cancer center.  Wear comfortable clothing and clothing appropriate for easy access to any Portacath or PICC line.   We strive to give you quality time with your provider. You may need to reschedule your appointment if you arrive late (15 or more minutes).  Arriving late affects you and other patients whose appointments are after yours.  Also, if you miss three or more appointments without notifying the office, you may be dismissed from the clinic at the provider's discretion.      For prescription refill requests, have your pharmacy contact our office and allow 72 hours for refills to be completed.    Today you received the following chemotherapy and/or immunotherapy agents Tecentriq      To help prevent nausea and vomiting after your treatment, we encourage you to take your nausea medication as directed.  BELOW ARE SYMPTOMS THAT SHOULD BE REPORTED IMMEDIATELY: *FEVER GREATER THAN 100.4 F (38 C) OR HIGHER *CHILLS OR SWEATING *NAUSEA AND VOMITING THAT IS NOT CONTROLLED WITH YOUR NAUSEA MEDICATION *UNUSUAL SHORTNESS OF BREATH *UNUSUAL BRUISING OR BLEEDING *URINARY PROBLEMS (pain or burning when urinating, or frequent urination) *BOWEL PROBLEMS (unusual diarrhea, constipation, pain near the anus) TENDERNESS IN MOUTH AND THROAT WITH OR WITHOUT PRESENCE OF ULCERS (sore throat, sores in mouth, or a toothache) UNUSUAL RASH, SWELLING OR PAIN  UNUSUAL VAGINAL DISCHARGE OR ITCHING   Items with * indicate a potential emergency and should be followed up as soon as possible or go to the  Emergency Department if any problems should occur.  Please show the CHEMOTHERAPY ALERT CARD or IMMUNOTHERAPY ALERT CARD at check-in to the Emergency Department and triage nurse.  Should you have questions after your visit or need to cancel or reschedule your appointment, please contact [facility] [phone]  and follow the prompts.  Office hours are 8:00 a.m. to 4:30 p.m. Monday - Friday. Please note that voicemails left after 4:00 p.m. may not be returned until the following business day.  We are closed weekends and major holidays. You have access to a nurse at all times for urgent questions. Please call the main number to the clinic [phone] and follow the prompts.  For any non-urgent questions, you may also contact your provider using MyChart. We now offer e-Visits for anyone 18 and older to request care online for non-urgent symptoms. For details visit [email or web address].   Also download the MyChart app! Go to the app store, search "MyChart", open the app, select [facility], and log in with your MyChart username and password.   

## 2023-04-27 NOTE — Progress Notes (Signed)
Patient presents today for chemotherapy/immunotherapy infusion of Tecentriq.  Patient is in satisfactory condition with no new complaints voiced.  Vital signs are stable.  Labs reviewed and all labs are within treatment parameters.  Potassium 3.4, of PO potassium ordered, patient refused. We will proceed with treatment per MD orders.   Patient tolerated treatment well with no complaints voiced.  Patient left ambulatory in stable condition.  Vital signs stable at discharge.  Follow up as scheduled.

## 2023-04-27 NOTE — Progress Notes (Signed)
Patients port flushed without difficulty.  Good blood return noted with no bruising or swelling noted at site.  Patient remains accessed for treatment.  

## 2023-04-30 ENCOUNTER — Other Ambulatory Visit: Payer: Self-pay

## 2023-05-06 DIAGNOSIS — M79674 Pain in right toe(s): Secondary | ICD-10-CM | POA: Diagnosis not present

## 2023-05-06 DIAGNOSIS — B351 Tinea unguium: Secondary | ICD-10-CM | POA: Diagnosis not present

## 2023-05-06 DIAGNOSIS — M79675 Pain in left toe(s): Secondary | ICD-10-CM | POA: Diagnosis not present

## 2023-05-14 DIAGNOSIS — G4733 Obstructive sleep apnea (adult) (pediatric): Secondary | ICD-10-CM | POA: Diagnosis not present

## 2023-05-18 ENCOUNTER — Ambulatory Visit: Payer: PPO | Admitting: Hematology

## 2023-05-18 ENCOUNTER — Other Ambulatory Visit: Payer: Self-pay

## 2023-05-18 ENCOUNTER — Inpatient Hospital Stay: Payer: PPO | Attending: Hematology

## 2023-05-18 ENCOUNTER — Inpatient Hospital Stay: Payer: PPO

## 2023-05-18 VITALS — BP 109/68 | HR 71 | Temp 96.2°F | Resp 18

## 2023-05-18 DIAGNOSIS — Z5112 Encounter for antineoplastic immunotherapy: Secondary | ICD-10-CM | POA: Diagnosis not present

## 2023-05-18 DIAGNOSIS — C3411 Malignant neoplasm of upper lobe, right bronchus or lung: Secondary | ICD-10-CM | POA: Diagnosis not present

## 2023-05-18 DIAGNOSIS — F17210 Nicotine dependence, cigarettes, uncomplicated: Secondary | ICD-10-CM | POA: Diagnosis not present

## 2023-05-18 LAB — CBC WITH DIFFERENTIAL/PLATELET
Abs Immature Granulocytes: 0.02 10*3/uL (ref 0.00–0.07)
Basophils Absolute: 0 10*3/uL (ref 0.0–0.1)
Basophils Relative: 1 %
Eosinophils Absolute: 0.2 10*3/uL (ref 0.0–0.5)
Eosinophils Relative: 3 %
HCT: 47.6 % (ref 39.0–52.0)
Hemoglobin: 16 g/dL (ref 13.0–17.0)
Immature Granulocytes: 0 %
Lymphocytes Relative: 15 %
Lymphs Abs: 0.9 10*3/uL (ref 0.7–4.0)
MCH: 31.5 pg (ref 26.0–34.0)
MCHC: 33.6 g/dL (ref 30.0–36.0)
MCV: 93.7 fL (ref 80.0–100.0)
Monocytes Absolute: 0.5 10*3/uL (ref 0.1–1.0)
Monocytes Relative: 8 %
Neutro Abs: 4.8 10*3/uL (ref 1.7–7.7)
Neutrophils Relative %: 73 %
Platelets: 191 10*3/uL (ref 150–400)
RBC: 5.08 MIL/uL (ref 4.22–5.81)
RDW: 14.3 % (ref 11.5–15.5)
WBC: 6.5 10*3/uL (ref 4.0–10.5)
nRBC: 0 % (ref 0.0–0.2)

## 2023-05-18 LAB — COMPREHENSIVE METABOLIC PANEL
ALT: 11 U/L (ref 0–44)
AST: 13 U/L — ABNORMAL LOW (ref 15–41)
Albumin: 3.6 g/dL (ref 3.5–5.0)
Alkaline Phosphatase: 70 U/L (ref 38–126)
Anion gap: 9 (ref 5–15)
BUN: 10 mg/dL (ref 8–23)
CO2: 27 mmol/L (ref 22–32)
Calcium: 8.2 mg/dL — ABNORMAL LOW (ref 8.9–10.3)
Chloride: 101 mmol/L (ref 98–111)
Creatinine, Ser: 0.79 mg/dL (ref 0.61–1.24)
GFR, Estimated: >60 mL/min (ref >60)
Glucose, Bld: 101 mg/dL — ABNORMAL HIGH (ref 70–99)
Potassium: 3.8 mmol/L (ref 3.5–5.1)
Sodium: 137 mmol/L (ref 135–145)
Total Bilirubin: 0.8 mg/dL (ref 0.3–1.2)
Total Protein: 6.6 g/dL (ref 6.5–8.1)

## 2023-05-18 LAB — MAGNESIUM: Magnesium: 1.9 mg/dL (ref 1.7–2.4)

## 2023-05-18 MED ORDER — SODIUM CHLORIDE 0.9 % IV SOLN: Freq: Once | INTRAVENOUS | Status: AC

## 2023-05-18 MED ORDER — SODIUM CHLORIDE 0.9% FLUSH
10.0000 mL | INTRAVENOUS | Status: DC | PRN
  Administered 2023-05-18: 10 mL

## 2023-05-18 MED ORDER — HEPARIN SOD (PORK) LOCK FLUSH 100 UNIT/ML IV SOLN
500.0000 [IU] | Freq: Once | INTRAVENOUS | Status: AC | PRN
  Administered 2023-05-18: 500 [IU]

## 2023-05-18 MED ORDER — SODIUM CHLORIDE 0.9% FLUSH
10.0000 mL | Freq: Once | INTRAVENOUS | Status: AC
  Administered 2023-05-18: 10 mL via INTRAVENOUS

## 2023-05-18 MED ORDER — SODIUM CHLORIDE 0.9 % IV SOLN
1200.0000 mg | Freq: Once | INTRAVENOUS | Status: AC
  Administered 2023-05-18: 1200 mg via INTRAVENOUS
  Filled 2023-05-18: qty 20

## 2023-05-18 NOTE — Patient Instructions (Signed)
[facility]  Discharge Instructions: Thank you for choosing [facility] to provide your oncology and hematology care.  If you have a lab appointment with the Cancer Center - please note that after April 8th, 2024, all labs will be drawn in the cancer center.  You do not have to check in or register with the main entrance as you have in the past but will complete your check-in in the cancer center.  Wear comfortable clothing and clothing appropriate for easy access to any Portacath or PICC line.   We strive to give you quality time with your provider. You may need to reschedule your appointment if you arrive late (15 or more minutes).  Arriving late affects you and other patients whose appointments are after yours.  Also, if you miss three or more appointments without notifying the office, you may be dismissed from the clinic at the provider's discretion.      For prescription refill requests, have your pharmacy contact our office and allow 72 hours for refills to be completed.    Today you received the following chemotherapy and/or immunotherapy agents Tecentriq      To help prevent nausea and vomiting after your treatment, we encourage you to take your nausea medication as directed.  BELOW ARE SYMPTOMS THAT SHOULD BE REPORTED IMMEDIATELY: *FEVER GREATER THAN 100.4 F (38 C) OR HIGHER *CHILLS OR SWEATING *NAUSEA AND VOMITING THAT IS NOT CONTROLLED WITH YOUR NAUSEA MEDICATION *UNUSUAL SHORTNESS OF BREATH *UNUSUAL BRUISING OR BLEEDING *URINARY PROBLEMS (pain or burning when urinating, or frequent urination) *BOWEL PROBLEMS (unusual diarrhea, constipation, pain near the anus) TENDERNESS IN MOUTH AND THROAT WITH OR WITHOUT PRESENCE OF ULCERS (sore throat, sores in mouth, or a toothache) UNUSUAL RASH, SWELLING OR PAIN  UNUSUAL VAGINAL DISCHARGE OR ITCHING   Items with * indicate a potential emergency and should be followed up as soon as possible or go to the  Emergency Department if any problems should occur.  Please show the CHEMOTHERAPY ALERT CARD or IMMUNOTHERAPY ALERT CARD at check-in to the Emergency Department and triage nurse.  Should you have questions after your visit or need to cancel or reschedule your appointment, please contact [facility] [phone]  and follow the prompts.  Office hours are 8:00 a.m. to 4:30 p.m. Monday - Friday. Please note that voicemails left after 4:00 p.m. may not be returned until the following business day.  We are closed weekends and major holidays. You have access to a nurse at all times for urgent questions. Please call the main number to the clinic [phone] and follow the prompts.  For any non-urgent questions, you may also contact your provider using MyChart. We now offer e-Visits for anyone 18 and older to request care online for non-urgent symptoms. For details visit [email or web address].   Also download the MyChart app! Go to the app store, search "MyChart", open the app, select [facility], and log in with your MyChart username and password.   

## 2023-05-18 NOTE — Progress Notes (Signed)
Patient presents today for Tecentriq infusion per providers order.  Vital signs and labs within parameters for treatment.   Patient has no new complaints at this time.  Treatment given today per MD orders.  Stable during infusion without adverse affects.  Vital signs stable.  No complaints at this time.  Discharge from clinic ambulatory in stable condition.  Alert and oriented X 3.  Follow up with [facility] as scheduled.

## 2023-05-28 ENCOUNTER — Other Ambulatory Visit: Payer: Self-pay

## 2023-06-02 ENCOUNTER — Other Ambulatory Visit: Payer: Self-pay

## 2023-06-02 DIAGNOSIS — C3411 Malignant neoplasm of upper lobe, right bronchus or lung: Secondary | ICD-10-CM

## 2023-06-08 ENCOUNTER — Inpatient Hospital Stay: Payer: PPO

## 2023-06-08 ENCOUNTER — Inpatient Hospital Stay: Payer: PPO | Admitting: Hematology

## 2023-06-08 NOTE — Progress Notes (Signed)
[facility] [address] [place] [address]    Clinic Day:  06/09/2023  Referring physician: Elfredia Nevins, MD  Patient Care Team: Elfredia Nevins, MD as PCP - General (Internal Medicine) Doreatha Massed, MD as Medical Oncologist (Medical Oncology) Therese Sarah, RN as Oncology Nurse Navigator (Medical Oncology)   ASSESSMENT & PLAN:   Assessment: 1. Extensive stage small cell lung cancer: - Productive cough for 3 weeks with hemoptysis.  Hemoptysis improved after Eliquis stopped 2 weeks ago.  Still has some purulent sputum.  Currently on Levaquin. - Abnormal chest x-ray on 01/19/2022 for productive cough - CT chest with contrast (01/21/2022): Right hilar mass measuring 6.5 x 5.2 cm surrounding and compressing the right upper lobe bronchus, bronchus intermedius, right middle lobe bronchus and right lower lobe bronchus centrally.  Compression of the right lower lobe pulmonary artery.  Nodular soft tissue lesion in the right lower lobe continuous with right hilar mass and could represent endobronchial spread.  Enlarged mediastinal lymph nodes but no findings of pulmonary metastatic disease.  Indeterminate bilateral adrenal gland nodules and low attenuation liver lesions, likely benign cyst. - No weight loss.  No chest pains. - PET scan: Right hilar mass involving upper lobe, middle lobe and lower lobe with satellite nodularity in the right lower lobe, hypermetabolic subcarinal and right paratracheal lymphadenopathy.  Pleural-based nodule along the right posterior costophrenic angle.  Hypermetabolic left adrenal lesion.  Small hypermetabolic lesions in the humerus bilaterally and a subcutaneous focus in the right posterior lower back. - Right lung hilar mass biopsy (01/29/2022): Small cell carcinoma -4 cycles of carboplatin, etoposide and atezolizumab from 02/23/2022 through 04/29/2022, maintenance Atezolizumab ongoing   2. Social/family history: - He lives at home  with his wife [name].  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  Smoked 1 pack/day for 50 years. - Father had renal cell carcinoma.    Plan: Extensive stage small cell lung cancer: - CT chest (03/31/2023): Posterior right upper lobe nodularity improved.  Progressive right upper lobe bronchus opacity nonspecific.  No evidence of recurrence. - MRI of the brain (04/02/2023): No evidence of metastatic disease. - He is tolerating atezolizumab mostly well.  No clear immunotherapy related side effects.  He reports losing his teeth lately.  Also has mild fatigue. - Reviewed labs today: Normal LFTs with mildly elevated total bilirubin 1.5.  Creatinine normal.  TSH is 0.49.  CBC was grossly normal. - Proceed with atezolizumab every 3 weeks.  RTC 9 weeks for follow-up.  Will repeat CT CAP with contrast and brain MRI.  2.  Difficulty sleeping: - He is taking THC supplements which are helping.  3.  Hypokalemia: - Continue potassium supplements.  Potassium is normal today.    Orders Placed This Encounter  Procedures   CT CHEST ABDOMEN PELVIS W CONTRAST    Standing Status:   Future    Standing Expiration Date:   06/08/2024    Order Specific Question:   If indicated for the ordered procedure, I authorize the administration of contrast media per Radiology protocol    Answer:   Yes    Order Specific Question:   Does the patient have a contrast media/X-ray dye allergy?    Answer:   No    Order Specific Question:   Preferred imaging location?    Answer:   [facility]    Order Specific Question:   If indicated for the ordered procedure, I authorize the  administration of oral contrast media per Radiology protocol    Answer:   Yes   MR Brain W Wo Contrast    Standing Status:   Future    Standing Expiration Date:   06/08/2024    Order Specific Question:   If indicated for the ordered procedure, I authorize the administration of contrast media per Radiology protocol     Answer:   Yes    Order Specific Question:   What is the patient's sedation requirement?    Answer:   No Sedation    Order Specific Question:   Does the patient have a pacemaker or implanted devices?    Answer:   No    Order Specific Question:   Use SRS Protocol?    Answer:   No    Order Specific Question:   Preferred imaging location?    Answer:   [facility] (table limit - 550lbs)      I,Katie Daubenspeck,acting as a scribe for Doreatha Massed, MD.,have documented all relevant documentation on the behalf of Doreatha Massed, MD,as directed by  Doreatha Massed, MD while in the presence of Doreatha Massed, MD.   I, Doreatha Massed MD, have reviewed the above documentation for accuracy and completeness, and I agree with the above.   Doreatha Massed, MD   9/4/202412:51 PM  CHIEF COMPLAINT:   Diagnosis: extensive stage small cell lung cancer    Cancer Staging  Small cell lung cancer, right upper lobe [facility]) Staging form: Lung, AJCC 8th Edition - Clinical stage from 01/22/2022: Stage IVB (cT4, cN2, pM1c) - Signed by Doreatha Massed, MD on 02/16/2022    Prior Therapy: 4 cycles of carboplatin, etoposide and Atezolizumab completed on 04/29/2022   Current Therapy:  Maintenance Atezolizumab every 3 weeks    HISTORY OF PRESENT ILLNESS:   Oncology History  Small cell lung cancer, right upper lobe (HCC)  01/22/2022 Initial Diagnosis   Small cell lung cancer, right upper lobe (HCC)   01/22/2022 Cancer Staging   Staging form: Lung, AJCC 8th Edition - Clinical stage from 01/22/2022: Stage IVB (cT4, cN2, pM1c) - Signed by Doreatha Massed, MD on 02/16/2022 Histopathologic type: Small cell carcinoma, NOS Stage prefix: Initial diagnosis   02/23/2022 - 05/25/2022 Chemotherapy   Patient is on Treatment Plan : LUNG SCLC Carboplatin + Etoposide + Atezolizumab Induction q21d / Atezolizumab Maintenance q21d     02/23/2022 -  Chemotherapy   Patient is on  Treatment Plan : LUNG SCLC Carboplatin + Etoposide + Atezolizumab Induction q21d x 4 cycles / Atezolizumab Maintenance q21d        INTERVAL HISTORY:   Larry Moore is a 72 y.o. male presenting to clinic today for follow up of extensive stage small cell lung cancer. He was last seen by me on 04/06/23.  Today, he states that he is doing well overall. His appetite level is at 60%. His energy level is at 25%.  PAST MEDICAL HISTORY:   Past Medical History: Past Medical History:  Diagnosis Date   Dyspnea    Dysrhythmia    hx a-fib   Hypertension    Insomnia    Pre-diabetes    Sleep apnea     Surgical History: Past Surgical History:  Procedure Laterality Date   ATRIAL FIBRILLATION ABLATION     approx 2020   CARDIAC CATHETERIZATION     HERNIA REPAIR     IR IMAGING GUIDED PORT INSERTION  02/17/2022   IR VENO/JUGULAR RIGHT  02/17/2022   VIDEO BRONCHOSCOPY WITH ENDOBRONCHIAL  ULTRASOUND Bilateral 01/29/2022   Procedure: VIDEO BRONCHOSCOPY WITH ENDOBRONCHIAL ULTRASOUND;  Surgeon: Josephine Igo, DO;  Location: [facility] OR;  Service: Cardiopulmonary;  Laterality: Bilateral;  w/ Guardant [id number]    Social History: Social History   Socioeconomic History   Marital status: Married    Spouse name: Not on file   Number of children: Not on file   Years of education: Not on file   Highest education level: Not on file  Occupational History   Occupation: Retired  Tobacco Use   Smoking status: Every Day    Types: Cigarettes    Passive exposure: Current   Smokeless tobacco: Not on file   Tobacco comments:    Pt smokes 1 ppd. 01/26/22  Vaping Use   Vaping status: Never Used  Substance and Sexual Activity   Alcohol use: Not Currently    Comment: 1 bottle wine a day    Drug use: No   Sexual activity: Not on file  Other Topics Concern   Not on file  Social History Narrative   Not on file   Social Determinants of Health   Financial Resource Strain: Low Risk  (12/16/2022)   Overall Financial  Resource Strain (CARDIA)    Difficulty of Paying Living Expenses: Not hard at all  Food Insecurity: No Food Insecurity (12/16/2022)   Hunger Vital Sign    Worried About Running Out of Food in the Last Year: Never true    Ran Out of Food in the Last Year: Never true  Transportation Needs: No Transportation Needs (12/16/2022)   PRAPARE - Administrator, Civil Service (Medical): No    Lack of Transportation (Non-Medical): No  Physical Activity: Not on file  Stress: Not on file  Social Connections: Not on file  Intimate Partner Violence: Not on file    Family History: Family History  Problem Relation Age of Onset   Coronary artery disease Father    Diabetes Father     Current Medications:  Current Outpatient Medications:    allopurinol (ZYLOPRIM) 300 MG tablet, Take 600 mg by mouth daily., Disp: , Rfl:    alprazolam (XANAX) 2 MG tablet, Take 1 tablet (2 mg total) by mouth at bedtime as needed for anxiety., Disp: 30 tablet, Rfl: 3   amoxicillin (AMOXIL) 500 MG capsule, Take 500 mg by mouth every 8 (eight) hours., Disp: , Rfl:    Atezolizumab (TECENTRIQ IV), Inject into the vein every 21 ( twenty-one) days., Disp: , Rfl:    furosemide (LASIX) 80 MG tablet, Take 80 mg by mouth daily., Disp: , Rfl:    lidocaine-prilocaine (EMLA) cream, Apply topically as directed., Disp: , Rfl:    metolazone (ZAROXOLYN) 2.5 MG tablet, Take 2.5 mg by mouth daily., Disp: , Rfl:    potassium chloride (KLOR-CON M) 10 MEQ tablet, Take 1 tablet (10 mEq total) by mouth 2 (two) times daily., Disp: 180 tablet, Rfl: 3   sotalol (BETAPACE) 80 MG tablet, SMARTSIG:1 Tablet(s) By Mouth Every 12 Hours, Disp: , Rfl:    Vitamin D, Ergocalciferol, (DRISDOL) 1.25 MG (50000 UNIT) CAPS capsule, Take 50,000 Units by mouth daily., Disp: , Rfl:    zolpidem (AMBIEN) 10 MG tablet, Take 10 mg by mouth at bedtime as needed., Disp: , Rfl:  No current facility-administered medications for this visit.  Facility-Administered  Medications Ordered in Other Visits:    atezolizumab (TECENTRIQ) 1,200 mg in sodium chloride 0.9 % 250 mL chemo infusion, 1,200 mg, Intravenous, Once, Doreatha Massed, MD, Last  Rate: 540 mL/hr at 06/09/23 1247, 1,200 mg at 06/09/23 1247   heparin lock flush 100 unit/mL, 500 Units, Intracatheter, Once PRN, Doreatha Massed, MD   sodium chloride flush (NS) 0.9 % injection 10 mL, 10 mL, Intracatheter, PRN, Doreatha Massed, MD   Allergies: No Known Allergies  REVIEW OF SYSTEMS:   Review of Systems  Constitutional:  Negative for chills, fatigue and fever.  HENT:   Negative for lump/mass, mouth sores, nosebleeds, sore throat and trouble swallowing.   Eyes:  Negative for eye problems.  Respiratory:  Negative for cough and shortness of breath.   Cardiovascular:  Positive for palpitations. Negative for chest pain and leg swelling.  Gastrointestinal:  Negative for abdominal pain, constipation, diarrhea, nausea and vomiting.  Genitourinary:  Negative for bladder incontinence, difficulty urinating, dysuria, frequency, hematuria and nocturia.   Musculoskeletal:  Negative for arthralgias, back pain, flank pain, myalgias and neck pain.  Skin:  Negative for itching and rash.  Neurological:  Negative for dizziness, headaches and numbness.  Hematological:  Does not bruise/bleed easily.  Psychiatric/Behavioral:  Negative for depression, sleep disturbance and suicidal ideas. The patient is not nervous/anxious.   All other systems reviewed and are negative.    VITALS:   There were no vitals taken for this visit.  Wt Readings from Last 3 Encounters:  06/09/23 221 lb 3.2 oz (100.3 kg)  04/27/23 228 lb 6.4 oz (103.6 kg)  04/06/23 225 lb 6.4 oz (102.2 kg)    There is no height or weight on file to calculate BMI.  Performance status (ECOG): 1 - Symptomatic but completely ambulatory  PHYSICAL EXAM:   Physical Exam Vitals and nursing note reviewed. Exam conducted with a chaperone present.   Constitutional:      Appearance: Normal appearance.  Cardiovascular:     Rate and Rhythm: Normal rate and regular rhythm.     Pulses: Normal pulses.     Heart sounds: Normal heart sounds.  Pulmonary:     Effort: Pulmonary effort is normal.     Breath sounds: Normal breath sounds.  Abdominal:     Palpations: Abdomen is soft. There is no hepatomegaly, splenomegaly or mass.     Tenderness: There is no abdominal tenderness.  Musculoskeletal:     Right lower leg: No edema.     Left lower leg: No edema.  Lymphadenopathy:     Cervical: No cervical adenopathy.     Right cervical: No superficial, deep or posterior cervical adenopathy.    Left cervical: No superficial, deep or posterior cervical adenopathy.     Upper Body:     Right upper body: No supraclavicular or axillary adenopathy.     Left upper body: No supraclavicular or axillary adenopathy.  Neurological:     General: No focal deficit present.     Mental Status: He is alert and oriented to person, place, and time.  Psychiatric:        Mood and Affect: Mood normal.        Behavior: Behavior normal.     LABS:      Latest Ref Rng & Units 06/09/2023   10:59 AM 05/18/2023    1:12 PM 04/27/2023   11:30 AM  CBC  WBC 4.0 - 10.5 K/uL 7.3  6.5  6.4   Hemoglobin 13.0 - 17.0 g/dL 29.5  62.1  30.8   Hematocrit 39.0 - 52.0 % 47.8  47.6  45.6   Platelets 150 - 400 K/uL 201  191  199  Latest Ref Rng & Units 06/09/2023   10:59 AM 05/18/2023    1:12 PM 04/27/2023   11:30 AM  CMP  Glucose 70 - 99 mg/dL 99  782  956   BUN 8 - 23 mg/dL 12  10  12    Creatinine 0.61 - 1.24 mg/dL 2.13  0.86  5.78   Sodium 135 - 145 mmol/L 136  137  136   Potassium 3.5 - 5.1 mmol/L 4.6  3.8  3.4   Chloride 98 - 111 mmol/L 103  101  99   CO2 22 - 32 mmol/L 26  27  27    Calcium 8.9 - 10.3 mg/dL 8.3  8.2  7.9   Total Protein 6.5 - 8.1 g/dL 6.5  6.6  6.5   Total Bilirubin 0.3 - 1.2 mg/dL 1.5  0.8  0.7   Alkaline Phos 38 - 126 U/L 70  70  70   AST 15 -  41 U/L 13  13  13    ALT 0 - 44 U/L 12  11  9       No results found for: "CEA1", "CEA" / No results found for: "CEA1", "CEA" No results found for: "PSA1" No results found for: "ION629" No results found for: "CAN125"  No results found for: "TOTALPROTELP", "ALBUMINELP", "A1GS", "A2GS", "BETS", "BETA2SER", "GAMS", "MSPIKE", "SPEI" No results found for: "TIBC", "FERRITIN", "IRONPCTSAT" No results found for: "LDH"   STUDIES:   No results found.

## 2023-06-09 ENCOUNTER — Inpatient Hospital Stay: Payer: PPO | Admitting: Hematology

## 2023-06-09 ENCOUNTER — Inpatient Hospital Stay: Payer: PPO | Attending: Hematology

## 2023-06-09 ENCOUNTER — Inpatient Hospital Stay: Payer: PPO

## 2023-06-09 VITALS — BP 125/82 | HR 67 | Temp 98.2°F | Resp 18

## 2023-06-09 DIAGNOSIS — E876 Hypokalemia: Secondary | ICD-10-CM | POA: Diagnosis not present

## 2023-06-09 DIAGNOSIS — C3411 Malignant neoplasm of upper lobe, right bronchus or lung: Secondary | ICD-10-CM

## 2023-06-09 DIAGNOSIS — Z8051 Family history of malignant neoplasm of kidney: Secondary | ICD-10-CM | POA: Diagnosis not present

## 2023-06-09 DIAGNOSIS — E279 Disorder of adrenal gland, unspecified: Secondary | ICD-10-CM | POA: Insufficient documentation

## 2023-06-09 DIAGNOSIS — R590 Localized enlarged lymph nodes: Secondary | ICD-10-CM | POA: Diagnosis not present

## 2023-06-09 DIAGNOSIS — Z5112 Encounter for antineoplastic immunotherapy: Secondary | ICD-10-CM | POA: Diagnosis not present

## 2023-06-09 DIAGNOSIS — Z79620 Long term (current) use of immunosuppressive biologic: Secondary | ICD-10-CM | POA: Insufficient documentation

## 2023-06-09 DIAGNOSIS — F17210 Nicotine dependence, cigarettes, uncomplicated: Secondary | ICD-10-CM | POA: Diagnosis not present

## 2023-06-09 LAB — TSH: TSH: 0.499 u[IU]/mL (ref 0.350–4.500)

## 2023-06-09 LAB — CBC WITH DIFFERENTIAL/PLATELET
Abs Immature Granulocytes: 0.05 10*3/uL (ref 0.00–0.07)
Basophils Absolute: 0 10*3/uL (ref 0.0–0.1)
Basophils Relative: 1 %
Eosinophils Absolute: 0.2 10*3/uL (ref 0.0–0.5)
Eosinophils Relative: 2 %
HCT: 47.8 % (ref 39.0–52.0)
Hemoglobin: 16.2 g/dL (ref 13.0–17.0)
Immature Granulocytes: 1 %
Lymphocytes Relative: 12 %
Lymphs Abs: 0.9 10*3/uL (ref 0.7–4.0)
MCH: 31.8 pg (ref 26.0–34.0)
MCHC: 33.9 g/dL (ref 30.0–36.0)
MCV: 93.7 fL (ref 80.0–100.0)
Monocytes Absolute: 0.6 10*3/uL (ref 0.1–1.0)
Monocytes Relative: 8 %
Neutro Abs: 5.6 10*3/uL (ref 1.7–7.7)
Neutrophils Relative %: 76 %
Platelets: 201 10*3/uL (ref 150–400)
RBC: 5.1 MIL/uL (ref 4.22–5.81)
RDW: 14.2 % (ref 11.5–15.5)
WBC: 7.3 10*3/uL (ref 4.0–10.5)
nRBC: 0 % (ref 0.0–0.2)

## 2023-06-09 LAB — COMPREHENSIVE METABOLIC PANEL
ALT: 12 U/L (ref 0–44)
AST: 13 U/L — ABNORMAL LOW (ref 15–41)
Albumin: 3.7 g/dL (ref 3.5–5.0)
Alkaline Phosphatase: 70 U/L (ref 38–126)
Anion gap: 7 (ref 5–15)
BUN: 12 mg/dL (ref 8–23)
CO2: 26 mmol/L (ref 22–32)
Calcium: 8.3 mg/dL — ABNORMAL LOW (ref 8.9–10.3)
Chloride: 103 mmol/L (ref 98–111)
Creatinine, Ser: 0.78 mg/dL (ref 0.61–1.24)
GFR, Estimated: >60 mL/min (ref >60)
Glucose, Bld: 99 mg/dL (ref 70–99)
Potassium: 4.6 mmol/L (ref 3.5–5.1)
Sodium: 136 mmol/L (ref 135–145)
Total Bilirubin: 1.5 mg/dL — ABNORMAL HIGH (ref 0.3–1.2)
Total Protein: 6.5 g/dL (ref 6.5–8.1)

## 2023-06-09 MED ORDER — SODIUM CHLORIDE 0.9% FLUSH
10.0000 mL | Freq: Once | INTRAVENOUS | Status: AC
  Administered 2023-06-09: 10 mL via INTRAVENOUS

## 2023-06-09 MED ORDER — SODIUM CHLORIDE 0.9% FLUSH
10.0000 mL | INTRAVENOUS | Status: DC | PRN
  Administered 2023-06-09: 10 mL

## 2023-06-09 MED ORDER — SODIUM CHLORIDE 0.9 % IV SOLN
1200.0000 mg | Freq: Once | INTRAVENOUS | Status: AC
  Administered 2023-06-09: 1200 mg via INTRAVENOUS
  Filled 2023-06-09: qty 20

## 2023-06-09 MED ORDER — SODIUM CHLORIDE 0.9 % IV SOLN: Freq: Once | INTRAVENOUS | Status: AC

## 2023-06-09 MED ORDER — HEPARIN SOD (PORK) LOCK FLUSH 100 UNIT/ML IV SOLN
500.0000 [IU] | Freq: Once | INTRAVENOUS | Status: AC | PRN
  Administered 2023-06-09: 500 [IU]

## 2023-06-09 NOTE — Progress Notes (Signed)
Patient has been examined by Dr. Katragadda. Vital signs and labs have been reviewed by MD - ANC, Creatinine, LFTs, hemoglobin, and platelets are within treatment parameters per M.D. - pt may proceed with treatment.  Primary RN and pharmacy notified.  

## 2023-06-09 NOTE — Patient Instructions (Signed)
[facility]  Discharge Instructions: Thank you for choosing [facility] to provide your oncology and hematology care.  If you have a lab appointment with the Cancer Center - please note that after April 8th, 2024, all labs will be drawn in the cancer center.  You do not have to check in or register with the main entrance as you have in the past but will complete your check-in in the cancer center.  Wear comfortable clothing and clothing appropriate for easy access to any Portacath or PICC line.   We strive to give you quality time with your provider. You may need to reschedule your appointment if you arrive late (15 or more minutes).  Arriving late affects you and other patients whose appointments are after yours.  Also, if you miss three or more appointments without notifying the office, you may be dismissed from the clinic at the provider's discretion.      For prescription refill requests, have your pharmacy contact our office and allow 72 hours for refills to be completed.    Today you received the following chemotherapy and/or immunotherapy agents Tecentriq      To help prevent nausea and vomiting after your treatment, we encourage you to take your nausea medication as directed.  BELOW ARE SYMPTOMS THAT SHOULD BE REPORTED IMMEDIATELY: *FEVER GREATER THAN 100.4 F (38 C) OR HIGHER *CHILLS OR SWEATING *NAUSEA AND VOMITING THAT IS NOT CONTROLLED WITH YOUR NAUSEA MEDICATION *UNUSUAL SHORTNESS OF BREATH *UNUSUAL BRUISING OR BLEEDING *URINARY PROBLEMS (pain or burning when urinating, or frequent urination) *BOWEL PROBLEMS (unusual diarrhea, constipation, pain near the anus) TENDERNESS IN MOUTH AND THROAT WITH OR WITHOUT PRESENCE OF ULCERS (sore throat, sores in mouth, or a toothache) UNUSUAL RASH, SWELLING OR PAIN  UNUSUAL VAGINAL DISCHARGE OR ITCHING   Items with * indicate a potential emergency and should be followed up as soon as possible or go to the  Emergency Department if any problems should occur.  Please show the CHEMOTHERAPY ALERT CARD or IMMUNOTHERAPY ALERT CARD at check-in to the Emergency Department and triage nurse.  Should you have questions after your visit or need to cancel or reschedule your appointment, please contact [facility] [phone]  and follow the prompts.  Office hours are 8:00 a.m. to 4:30 p.m. Monday - Friday. Please note that voicemails left after 4:00 p.m. may not be returned until the following business day.  We are closed weekends and major holidays. You have access to a nurse at all times for urgent questions. Please call the main number to the clinic [phone] and follow the prompts.  For any non-urgent questions, you may also contact your provider using MyChart. We now offer e-Visits for anyone 18 and older to request care online for non-urgent symptoms. For details visit [email or web address].   Also download the MyChart app! Go to the app store, search "MyChart", open the app, select [facility], and log in with your MyChart username and password.   

## 2023-06-09 NOTE — Progress Notes (Signed)

## 2023-06-09 NOTE — Patient Instructions (Signed)

## 2023-06-11 ENCOUNTER — Other Ambulatory Visit: Payer: Self-pay

## 2023-06-30 ENCOUNTER — Other Ambulatory Visit: Payer: Self-pay

## 2023-06-30 ENCOUNTER — Inpatient Hospital Stay: Payer: PPO

## 2023-06-30 VITALS — BP 121/75 | HR 61 | Temp 97.6°F | Resp 18

## 2023-06-30 DIAGNOSIS — Z5112 Encounter for antineoplastic immunotherapy: Secondary | ICD-10-CM | POA: Diagnosis not present

## 2023-06-30 DIAGNOSIS — C3411 Malignant neoplasm of upper lobe, right bronchus or lung: Secondary | ICD-10-CM

## 2023-06-30 LAB — CBC WITH DIFFERENTIAL/PLATELET
Abs Immature Granulocytes: 0.04 10*3/uL (ref 0.00–0.07)
Basophils Absolute: 0 10*3/uL (ref 0.0–0.1)
Basophils Relative: 0 %
Eosinophils Absolute: 0.3 10*3/uL (ref 0.0–0.5)
Eosinophils Relative: 5 %
HCT: 47.5 % (ref 39.0–52.0)
Hemoglobin: 16.1 g/dL (ref 13.0–17.0)
Immature Granulocytes: 1 %
Lymphocytes Relative: 14 %
Lymphs Abs: 0.9 10*3/uL (ref 0.7–4.0)
MCH: 31.7 pg (ref 26.0–34.0)
MCHC: 33.9 g/dL (ref 30.0–36.0)
MCV: 93.5 fL (ref 80.0–100.0)
Monocytes Absolute: 0.5 10*3/uL (ref 0.1–1.0)
Monocytes Relative: 8 %
Neutro Abs: 4.4 10*3/uL (ref 1.7–7.7)
Neutrophils Relative %: 72 %
Platelets: 185 10*3/uL (ref 150–400)
RBC: 5.08 MIL/uL (ref 4.22–5.81)
RDW: 13.9 % (ref 11.5–15.5)
WBC: 6.1 10*3/uL (ref 4.0–10.5)
nRBC: 0 % (ref 0.0–0.2)

## 2023-06-30 LAB — COMPREHENSIVE METABOLIC PANEL
ALT: 9 U/L (ref 0–44)
AST: 11 U/L — ABNORMAL LOW (ref 15–41)
Albumin: 3.6 g/dL (ref 3.5–5.0)
Alkaline Phosphatase: 67 U/L (ref 38–126)
Anion gap: 8 (ref 5–15)
BUN: 9 mg/dL (ref 8–23)
CO2: 27 mmol/L (ref 22–32)
Calcium: 8 mg/dL — ABNORMAL LOW (ref 8.9–10.3)
Chloride: 102 mmol/L (ref 98–111)
Creatinine, Ser: 0.77 mg/dL (ref 0.61–1.24)
GFR, Estimated: >60 mL/min (ref >60)
Glucose, Bld: 88 mg/dL (ref 70–99)
Potassium: 4 mmol/L (ref 3.5–5.1)
Sodium: 137 mmol/L (ref 135–145)
Total Bilirubin: 1.1 mg/dL (ref 0.3–1.2)
Total Protein: 6.4 g/dL — ABNORMAL LOW (ref 6.5–8.1)

## 2023-06-30 LAB — MAGNESIUM: Magnesium: 1.8 mg/dL (ref 1.7–2.4)

## 2023-06-30 MED ORDER — SODIUM CHLORIDE 0.9% FLUSH
10.0000 mL | Freq: Once | INTRAVENOUS | Status: AC
  Administered 2023-06-30: 10 mL

## 2023-06-30 MED ORDER — SODIUM CHLORIDE 0.9 % IV SOLN: Freq: Once | INTRAVENOUS | Status: AC

## 2023-06-30 MED ORDER — SODIUM CHLORIDE 0.9% FLUSH
10.0000 mL | INTRAVENOUS | Status: DC | PRN
  Administered 2023-06-30: 10 mL

## 2023-06-30 MED ORDER — POTASSIUM CHLORIDE CRYS ER 10 MEQ PO TBCR: 10.0000 meq | EXTENDED_RELEASE_TABLET | Freq: Two times a day (BID) | ORAL | 3 refills | Status: DC

## 2023-06-30 MED ORDER — HEPARIN SOD (PORK) LOCK FLUSH 100 UNIT/ML IV SOLN
500.0000 [IU] | Freq: Once | INTRAVENOUS | Status: AC | PRN
  Administered 2023-06-30: 500 [IU]

## 2023-06-30 MED ORDER — SODIUM CHLORIDE 0.9 % IV SOLN
1200.0000 mg | Freq: Once | INTRAVENOUS | Status: AC
  Administered 2023-06-30: 1200 mg via INTRAVENOUS
  Filled 2023-06-30: qty 20

## 2023-06-30 NOTE — Progress Notes (Signed)
Labs reviewed ok to treat per parameters. No new issues reported by patient today.  Treatment given per orders. Patient tolerated it well without problems. Vitals stable and discharged home from clinic ambulatory. Follow up as scheduled.

## 2023-06-30 NOTE — Patient Instructions (Addendum)
[facility]  Discharge Instructions: Thank you for choosing [facility] to provide your oncology and hematology care.  If you have a lab appointment with the Cancer Center - please note that after April 8th, 2024, all labs will be drawn in the cancer center.  You do not have to check in or register with the main entrance as you have in the past but will complete your check-in in the cancer center.  Wear comfortable clothing and clothing appropriate for easy access to any Portacath or PICC line.   We strive to give you quality time with your provider. You may need to reschedule your appointment if you arrive late (15 or more minutes).  Arriving late affects you and other patients whose appointments are after yours.  Also, if you miss three or more appointments without notifying the office, you may be dismissed from the clinic at the provider's discretion.      For prescription refill requests, have your pharmacy contact our office and allow 72 hours for refills to be completed.    Today you received the following chemotherapy and/or immunotherapy agents atelolizumab   To help prevent nausea and vomiting after your treatment, we encourage you to take your nausea medication as directed.  BELOW ARE SYMPTOMS THAT SHOULD BE REPORTED IMMEDIATELY: *FEVER GREATER THAN 100.4 F (38 C) OR HIGHER *CHILLS OR SWEATING *NAUSEA AND VOMITING THAT IS NOT CONTROLLED WITH YOUR NAUSEA MEDICATION *UNUSUAL SHORTNESS OF BREATH *UNUSUAL BRUISING OR BLEEDING *URINARY PROBLEMS (pain or burning when urinating, or frequent urination) *BOWEL PROBLEMS (unusual diarrhea, constipation, pain near the anus) TENDERNESS IN MOUTH AND THROAT WITH OR WITHOUT PRESENCE OF ULCERS (sore throat, sores in mouth, or a toothache) UNUSUAL RASH, SWELLING OR PAIN  UNUSUAL VAGINAL DISCHARGE OR ITCHING   Items with * indicate a potential emergency and should be followed up as soon as possible or go to the  Emergency Department if any problems should occur.  Please show the CHEMOTHERAPY ALERT CARD or IMMUNOTHERAPY ALERT CARD at check-in to the Emergency Department and triage nurse.  Should you have questions after your visit or need to cancel or reschedule your appointment, please contact [facility] [phone]  and follow the prompts.  Office hours are 8:00 a.m. to 4:30 p.m. Monday - Friday. Please note that voicemails left after 4:00 p.m. may not be returned until the following business day.  We are closed weekends and major holidays. You have access to a nurse at all times for urgent questions. Please call the main number to the clinic [phone] and follow the prompts.  For any non-urgent questions, you may also contact your provider using MyChart. We now offer e-Visits for anyone 64 and older to request care online for non-urgent symptoms. For details visit [email or web address].   Also download the MyChart app! Go to the app store, search "MyChart", open the app, select [facility], and log in with your MyChart username and password.

## 2023-06-30 NOTE — Progress Notes (Signed)
Patient in clinic today for port flush, labs, and treatment. Port flush and labs drawn and patient left accessed for treatment.

## 2023-07-13 ENCOUNTER — Other Ambulatory Visit: Payer: Self-pay | Admitting: Hematology

## 2023-07-15 DIAGNOSIS — M79674 Pain in right toe(s): Secondary | ICD-10-CM | POA: Diagnosis not present

## 2023-07-15 DIAGNOSIS — B351 Tinea unguium: Secondary | ICD-10-CM | POA: Diagnosis not present

## 2023-07-20 ENCOUNTER — Other Ambulatory Visit: Payer: Self-pay

## 2023-07-21 ENCOUNTER — Inpatient Hospital Stay: Payer: PPO | Attending: Hematology

## 2023-07-21 ENCOUNTER — Inpatient Hospital Stay: Payer: PPO

## 2023-07-21 VITALS — BP 110/76 | HR 77 | Temp 97.2°F | Resp 18

## 2023-07-21 VITALS — BP 91/61 | HR 72 | Temp 97.2°F | Resp 20 | Wt 226.2 lb

## 2023-07-21 DIAGNOSIS — F17210 Nicotine dependence, cigarettes, uncomplicated: Secondary | ICD-10-CM | POA: Insufficient documentation

## 2023-07-21 DIAGNOSIS — Z79620 Long term (current) use of immunosuppressive biologic: Secondary | ICD-10-CM | POA: Insufficient documentation

## 2023-07-21 DIAGNOSIS — Z5112 Encounter for antineoplastic immunotherapy: Secondary | ICD-10-CM | POA: Diagnosis not present

## 2023-07-21 DIAGNOSIS — Z95828 Presence of other vascular implants and grafts: Secondary | ICD-10-CM

## 2023-07-21 DIAGNOSIS — C3411 Malignant neoplasm of upper lobe, right bronchus or lung: Secondary | ICD-10-CM | POA: Diagnosis not present

## 2023-07-21 LAB — COMPREHENSIVE METABOLIC PANEL
ALT: 10 U/L (ref 0–44)
AST: 10 U/L — ABNORMAL LOW (ref 15–41)
Albumin: 3.5 g/dL (ref 3.5–5.0)
Alkaline Phosphatase: 72 U/L (ref 38–126)
Anion gap: 7 (ref 5–15)
BUN: 9 mg/dL (ref 8–23)
CO2: 27 mmol/L (ref 22–32)
Calcium: 8.2 mg/dL — ABNORMAL LOW (ref 8.9–10.3)
Chloride: 105 mmol/L (ref 98–111)
Creatinine, Ser: 0.85 mg/dL (ref 0.61–1.24)
GFR, Estimated: >60 mL/min (ref >60)
Glucose, Bld: 97 mg/dL (ref 70–99)
Potassium: 5.4 mmol/L — ABNORMAL HIGH (ref 3.5–5.1)
Sodium: 139 mmol/L (ref 135–145)
Total Bilirubin: 0.6 mg/dL (ref 0.3–1.2)
Total Protein: 6.4 g/dL — ABNORMAL LOW (ref 6.5–8.1)

## 2023-07-21 LAB — CBC WITH DIFFERENTIAL/PLATELET
Abs Immature Granulocytes: 0.04 10*3/uL (ref 0.00–0.07)
Basophils Absolute: 0 10*3/uL (ref 0.0–0.1)
Basophils Relative: 0 %
Eosinophils Absolute: 0.2 10*3/uL (ref 0.0–0.5)
Eosinophils Relative: 3 %
HCT: 48.6 % (ref 39.0–52.0)
Hemoglobin: 16.4 g/dL (ref 13.0–17.0)
Immature Granulocytes: 1 %
Lymphocytes Relative: 11 %
Lymphs Abs: 0.7 10*3/uL (ref 0.7–4.0)
MCH: 31.8 pg (ref 26.0–34.0)
MCHC: 33.7 g/dL (ref 30.0–36.0)
MCV: 94.2 fL (ref 80.0–100.0)
Monocytes Absolute: 0.6 10*3/uL (ref 0.1–1.0)
Monocytes Relative: 8 %
Neutro Abs: 5.2 10*3/uL (ref 1.7–7.7)
Neutrophils Relative %: 77 %
Platelets: 195 10*3/uL (ref 150–400)
RBC: 5.16 MIL/uL (ref 4.22–5.81)
RDW: 13.7 % (ref 11.5–15.5)
WBC: 6.7 10*3/uL (ref 4.0–10.5)
nRBC: 0 % (ref 0.0–0.2)

## 2023-07-21 LAB — TSH: TSH: 0.47 u[IU]/mL (ref 0.350–4.500)

## 2023-07-21 LAB — MAGNESIUM: Magnesium: 1.9 mg/dL (ref 1.7–2.4)

## 2023-07-21 MED ORDER — SODIUM CHLORIDE 0.9 % IV SOLN: Freq: Once | INTRAVENOUS | Status: AC

## 2023-07-21 MED ORDER — SODIUM CHLORIDE 0.9 % IV SOLN
1200.0000 mg | Freq: Once | INTRAVENOUS | Status: AC
  Administered 2023-07-21: 1200 mg via INTRAVENOUS
  Filled 2023-07-21: qty 20

## 2023-07-21 MED ORDER — HEPARIN SOD (PORK) LOCK FLUSH 100 UNIT/ML IV SOLN
500.0000 [IU] | Freq: Once | INTRAVENOUS | Status: AC | PRN
  Administered 2023-07-21: 500 [IU]

## 2023-07-21 MED ORDER — SODIUM CHLORIDE 0.9% FLUSH
10.0000 mL | INTRAVENOUS | Status: DC | PRN
  Administered 2023-07-21: 10 mL via INTRAVENOUS

## 2023-07-21 MED ORDER — ALTEPLASE 2 MG IJ SOLR
2.0000 mg | Freq: Once | INTRAMUSCULAR | Status: AC | PRN
  Administered 2023-07-21: 2 mg
  Filled 2023-07-21: qty 2

## 2023-07-21 NOTE — Progress Notes (Signed)
Patients port flushed without difficulty.  No blood return noted with no bruising or swelling noted at site.  Alteplase 2 mg administered at 1025.  Labs drawn peripherally.  Patient remains accessed for treatment.

## 2023-07-21 NOTE — Patient Instructions (Signed)
[facility]  Discharge Instructions: Thank you for choosing [facility] to provide your oncology and hematology care.  If you have a lab appointment with the Cancer Center - please note that after April 8th, 2024, all labs will be drawn in the cancer center.  You do not have to check in or register with the main entrance as you have in the past but will complete your check-in in the cancer center.  Wear comfortable clothing and clothing appropriate for easy access to any Portacath or PICC line.   We strive to give you quality time with your provider. You may need to reschedule your appointment if you arrive late (15 or more minutes).  Arriving late affects you and other patients whose appointments are after yours.  Also, if you miss three or more appointments without notifying the office, you may be dismissed from the clinic at the provider's discretion.      For prescription refill requests, have your pharmacy contact our office and allow 72 hours for refills to be completed.    Today you received the following chemotherapy and/or immunotherapy agents Tecentriq    To help prevent nausea and vomiting after your treatment, we encourage you to take your nausea medication as directed.  BELOW ARE SYMPTOMS THAT SHOULD BE REPORTED IMMEDIATELY: *FEVER GREATER THAN 100.4 F (38 C) OR HIGHER *CHILLS OR SWEATING *NAUSEA AND VOMITING THAT IS NOT CONTROLLED WITH YOUR NAUSEA MEDICATION *UNUSUAL SHORTNESS OF BREATH *UNUSUAL BRUISING OR BLEEDING *URINARY PROBLEMS (pain or burning when urinating, or frequent urination) *BOWEL PROBLEMS (unusual diarrhea, constipation, pain near the anus) TENDERNESS IN MOUTH AND THROAT WITH OR WITHOUT PRESENCE OF ULCERS (sore throat, sores in mouth, or a toothache) UNUSUAL RASH, SWELLING OR PAIN  UNUSUAL VAGINAL DISCHARGE OR ITCHING   Items with * indicate a potential emergency and should be followed up as soon as possible or go to the  Emergency Department if any problems should occur.  Please show the CHEMOTHERAPY ALERT CARD or IMMUNOTHERAPY ALERT CARD at check-in to the Emergency Department and triage nurse.  Should you have questions after your visit or need to cancel or reschedule your appointment, please contact [facility] [phone]  and follow the prompts.  Office hours are 8:00 a.m. to 4:30 p.m. Monday - Friday. Please note that voicemails left after 4:00 p.m. may not be returned until the following business day.  We are closed weekends and major holidays. You have access to a nurse at all times for urgent questions. Please call the main number to the clinic [phone] and follow the prompts.  For any non-urgent questions, you may also contact your provider using MyChart. We now offer e-Visits for anyone 36 and older to request care online for non-urgent symptoms. For details visit [email or web address].   Also download the MyChart app! Go to the app store, search "MyChart", open the app, select [facility], and log in with your MyChart username and password.

## 2023-07-21 NOTE — Progress Notes (Signed)
Blood return noted from port at 1055. Alteplase withdrawn and 10 mL of blood wasted, and port flushed per protocol.   Pt.'s K-5.4. per pt he took an extra dose this morning with his morning medications. Pt asymptomatic. MD and treatment team made aware. Per MD to proceed with treatment and for pt to hold potassium tonight and tomorrow and then restart taking 10 mEq of potassium. Pt updated and all questions answered at this time.   Patient tolerated treatment well with no complaints voiced.  Side effects with management reviewed with understanding verbalized.  Port site clean and dry with no bruising or swelling noted at site.  Good blood return noted before and after administration of chemotherapy.  Band aid applied.  Patient left in satisfactory condition with VSS and no s/s of distress noted. All follow ups as scheduled.   Larry Moore [employer]

## 2023-07-28 ENCOUNTER — Other Ambulatory Visit: Payer: Self-pay

## 2023-07-28 ENCOUNTER — Ambulatory Visit (HOSPITAL_COMMUNITY)
Admission: RE | Admit: 2023-07-28 | Discharge: 2023-07-28 | Disposition: A | Discharge status: Home / Self Care | Payer: PPO | Source: Ambulatory Visit | Attending: Hematology | Admitting: Hematology

## 2023-07-28 DIAGNOSIS — C3491 Malignant neoplasm of unspecified part of right bronchus or lung: Secondary | ICD-10-CM | POA: Diagnosis not present

## 2023-07-28 DIAGNOSIS — J329 Chronic sinusitis, unspecified: Secondary | ICD-10-CM | POA: Diagnosis not present

## 2023-07-28 DIAGNOSIS — R9089 Other abnormal findings on diagnostic imaging of central nervous system: Secondary | ICD-10-CM | POA: Diagnosis not present

## 2023-07-28 DIAGNOSIS — C3411 Malignant neoplasm of upper lobe, right bronchus or lung: Secondary | ICD-10-CM | POA: Insufficient documentation

## 2023-07-28 DIAGNOSIS — G319 Degenerative disease of nervous system, unspecified: Secondary | ICD-10-CM | POA: Diagnosis not present

## 2023-07-28 DIAGNOSIS — K7689 Other specified diseases of liver: Secondary | ICD-10-CM | POA: Diagnosis not present

## 2023-07-28 DIAGNOSIS — J90 Pleural effusion, not elsewhere classified: Secondary | ICD-10-CM | POA: Diagnosis not present

## 2023-07-28 DIAGNOSIS — I3139 Other pericardial effusion (noninflammatory): Secondary | ICD-10-CM | POA: Diagnosis not present

## 2023-07-28 MED ORDER — IOHEXOL 300 MG/ML  SOLN
100.0000 mL | Freq: Once | INTRAMUSCULAR | Status: AC | PRN
Start: 2023-07-28 — End: 2023-07-28
  Administered 2023-07-28: 100 mL via INTRAVENOUS

## 2023-07-28 MED ORDER — GADOBUTROL 1 MMOL/ML IV SOLN
10.0000 mL | Freq: Once | INTRAVENOUS | Status: AC | PRN
Start: 2023-07-28 — End: 2023-07-28
  Administered 2023-07-28: 10 mL via INTRAVENOUS

## 2023-08-10 ENCOUNTER — Other Ambulatory Visit: Payer: Self-pay

## 2023-08-11 ENCOUNTER — Inpatient Hospital Stay: Payer: PPO | Attending: Hematology | Admitting: Hematology

## 2023-08-11 ENCOUNTER — Inpatient Hospital Stay: Payer: PPO

## 2023-08-11 DIAGNOSIS — C3411 Malignant neoplasm of upper lobe, right bronchus or lung: Secondary | ICD-10-CM

## 2023-08-11 DIAGNOSIS — F17210 Nicotine dependence, cigarettes, uncomplicated: Secondary | ICD-10-CM | POA: Insufficient documentation

## 2023-08-11 DIAGNOSIS — E876 Hypokalemia: Secondary | ICD-10-CM | POA: Insufficient documentation

## 2023-08-11 DIAGNOSIS — Z923 Personal history of irradiation: Secondary | ICD-10-CM | POA: Diagnosis not present

## 2023-08-11 DIAGNOSIS — J90 Pleural effusion, not elsewhere classified: Secondary | ICD-10-CM | POA: Insufficient documentation

## 2023-08-11 DIAGNOSIS — Z5112 Encounter for antineoplastic immunotherapy: Secondary | ICD-10-CM | POA: Diagnosis not present

## 2023-08-11 LAB — CBC WITH DIFFERENTIAL/PLATELET
Abs Immature Granulocytes: 0.06 10*3/uL (ref 0.00–0.07)
Basophils Absolute: 0 10*3/uL (ref 0.0–0.1)
Basophils Relative: 0 %
Eosinophils Absolute: 0.1 10*3/uL (ref 0.0–0.5)
Eosinophils Relative: 1 %
HCT: 43.1 % (ref 39.0–52.0)
Hemoglobin: 14.2 g/dL (ref 13.0–17.0)
Immature Granulocytes: 1 %
Lymphocytes Relative: 7 %
Lymphs Abs: 0.6 10*3/uL — ABNORMAL LOW (ref 0.7–4.0)
MCH: 30.7 pg (ref 26.0–34.0)
MCHC: 32.9 g/dL (ref 30.0–36.0)
MCV: 93.3 fL (ref 80.0–100.0)
Monocytes Absolute: 0.8 10*3/uL (ref 0.1–1.0)
Monocytes Relative: 9 %
Neutro Abs: 7.3 10*3/uL (ref 1.7–7.7)
Neutrophils Relative %: 82 %
Platelets: 284 10*3/uL (ref 150–400)
RBC: 4.62 MIL/uL (ref 4.22–5.81)
RDW: 13.1 % (ref 11.5–15.5)
WBC: 8.9 10*3/uL (ref 4.0–10.5)
nRBC: 0 % (ref 0.0–0.2)

## 2023-08-11 LAB — COMPREHENSIVE METABOLIC PANEL
ALT: 11 U/L (ref 0–44)
AST: 11 U/L — ABNORMAL LOW (ref 15–41)
Albumin: 2.8 g/dL — ABNORMAL LOW (ref 3.5–5.0)
Alkaline Phosphatase: 63 U/L (ref 38–126)
Anion gap: 6 (ref 5–15)
BUN: 9 mg/dL (ref 8–23)
CO2: 30 mmol/L (ref 22–32)
Calcium: 7.6 mg/dL — ABNORMAL LOW (ref 8.9–10.3)
Chloride: 100 mmol/L (ref 98–111)
Creatinine, Ser: 0.74 mg/dL (ref 0.61–1.24)
GFR, Estimated: >60 mL/min (ref >60)
Glucose, Bld: 120 mg/dL — ABNORMAL HIGH (ref 70–99)
Potassium: 3.4 mmol/L — ABNORMAL LOW (ref 3.5–5.1)
Sodium: 136 mmol/L (ref 135–145)
Total Bilirubin: 0.8 mg/dL (ref <1.2)
Total Protein: 6.3 g/dL — ABNORMAL LOW (ref 6.5–8.1)

## 2023-08-11 LAB — MAGNESIUM: Magnesium: 2 mg/dL (ref 1.7–2.4)

## 2023-08-11 MED ORDER — HEPARIN SOD (PORK) LOCK FLUSH 100 UNIT/ML IV SOLN
500.0000 [IU] | Freq: Once | INTRAVENOUS | Status: AC
  Administered 2023-08-11: 500 [IU] via INTRAVENOUS

## 2023-08-11 MED ORDER — PREDNISONE 50 MG PO TABS: 50.0000 mg | ORAL_TABLET | Freq: Every day | ORAL | 0 refills | Status: DC

## 2023-08-11 MED ORDER — LEVOFLOXACIN 500 MG PO TABS: 500.0000 mg | ORAL_TABLET | Freq: Every day | ORAL | 0 refills | Status: DC

## 2023-08-11 MED ORDER — SODIUM CHLORIDE 0.9% FLUSH
10.0000 mL | Freq: Once | INTRAVENOUS | Status: AC
  Administered 2023-08-11: 10 mL via INTRAVENOUS

## 2023-08-11 NOTE — Patient Instructions (Addendum)
[facility] at [facility] Discharge Instructions   You were seen and examined today by Dr. Ellin Saba.  He reviewed the results of your lab work which are mostly normal/stable. Your albumin was low at 2.8.   He reviewed the results of the MRI of your brain which was normal.   He reviewed the results of your CT scan. There appears to be an area of infection/inflammation in the right lung where the radiation was given. Dr. Kirtland Bouchard thinks this could be pneumonitis or an infection.   We will hold your treatment today. We will send in an antibiotic for you and also steroid (prednisone).   Return as scheduled.    Thank you for choosing [facility] at [facility] to provide your oncology and hematology care.  To afford each patient quality time with our provider, please arrive at least 15 minutes before your scheduled appointment time.   If you have a lab appointment with the Cancer Center please come in thru the Main Entrance and check in at the main information desk.  You need to re-schedule your appointment should you arrive 10 or more minutes late.  We strive to give you quality time with our providers, and arriving late affects you and other patients whose appointments are after yours.  Also, if you no show three or more times for appointments you may be dismissed from the clinic at the providers discretion.     Again, thank you for choosing [facility].  Our hope is that these requests will decrease the amount of time that you wait before being seen by our physicians.       _____________________________________________________________  Should you have questions after your visit to [facility], please contact our office at [phone] and follow the prompts.  Our office hours are 8:00 a.m. and 4:30 p.m. Monday - Friday.  Please note that voicemails left after 4:00 p.m. may not be returned until the following business day.  We  are closed weekends and major holidays.  You do have access to a nurse 24-7, just call the main number to the clinic [phone] and do not press any options, hold on the line and a nurse will answer the phone.    For prescription refill requests, have your pharmacy contact our office and allow 72 hours.    Due to Covid, you will need to wear a mask upon entering the hospital. If you do not have a mask, a mask will be given to you at the Main Entrance upon arrival. For doctor visits, patients may have 1 support person age 16 or older with them. For treatment visits, patients can not have anyone with them due to social distancing guidelines and our immunocompromised population.

## 2023-08-11 NOTE — Progress Notes (Signed)
[facility] [address], [place] [address]    Clinic Day:  08/11/2023  Referring physician: Elfredia Nevins, MD  Patient Care Team: Larry Nevins, MD as PCP - General (Internal Medicine) Doreatha Massed, MD as Medical Oncologist (Medical Oncology) Therese Sarah, RN as Oncology Nurse Navigator (Medical Oncology)   ASSESSMENT & PLAN:   Assessment: 1. Extensive stage small cell lung cancer: - Productive cough for 3 weeks with hemoptysis.  Hemoptysis improved after Eliquis stopped 2 weeks ago.  Still has some purulent sputum.  Currently on Levaquin. - Abnormal chest x-ray on 01/19/2022 for productive cough - CT chest with contrast (01/21/2022): Right hilar mass measuring 6.5 x 5.2 cm surrounding and compressing the right upper lobe bronchus, bronchus intermedius, right middle lobe bronchus and right lower lobe bronchus centrally.  Compression of the right lower lobe pulmonary artery.  Nodular soft tissue lesion in the right lower lobe continuous with right hilar mass and could represent endobronchial spread.  Enlarged mediastinal lymph nodes but no findings of pulmonary metastatic disease.  Indeterminate bilateral adrenal gland nodules and low attenuation liver lesions, likely benign cyst. - No weight loss.  No chest pains. - PET scan: Right hilar mass involving upper lobe, middle lobe and lower lobe with satellite nodularity in the right lower lobe, hypermetabolic subcarinal and right paratracheal lymphadenopathy.  Pleural-based nodule along the right posterior costophrenic angle.  Hypermetabolic left adrenal lesion.  Small hypermetabolic lesions in the humerus bilaterally and a subcutaneous focus in the right posterior lower back. - Right lung hilar mass biopsy (01/29/2022): Small cell carcinoma -4 cycles of carboplatin, etoposide and atezolizumab from 02/23/2022 through 04/29/2022, maintenance Atezolizumab ongoing   2. Social/family history: - He lives at home  with his wife Larry Moore.  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  Smoked 1 pack/day for 50 years. - Father had renal cell carcinoma.    Plan: Extensive stage small cell lung cancer: - He is tolerating atezolizumab well.  No immunotherapy related side effects. - He reported cough and sputum (off-white) for the last 2 days associated with severe fatigue.  He had a low-grade temperature of 100.1.  He denied any fevers at home. - Reviewed MRI of the brain from 07/28/2023: No evidence of intracranial metastatic disease. - CT CAP (07/28/2023): Significant interval increase in groundglass opacities throughout the perihilar right lung particularly involving right lower lobe.  No adenopathy seen.  No evidence of metastatic disease. - He did receive radiation therapy to the right lung area in May 2023.  Differential includes infection/pneumonitis. - He reports that he had teeth pulled 2 weeks ago and took amoxicillin for 5 to 7 days. - I will hold his treatment today. - I will give a trial of Levaquin 500 milligrams x 7 days and prednisone 50 mg x 5 days for possible pneumonitis. - We will reevaluate him next week.  I plan to repeat CT chest in 6 to 8 weeks.   2.  Difficulty sleeping: - He is taking THC supplements which are helping.  3.  Hypokalemia: - Continue potassium supplements.  Potassium is 3.4 today.    No orders of the defined types were placed in this encounter.     I,Katie Daubenspeck,acting as a Neurosurgeon for Doreatha Massed, MD.,have documented all relevant documentation on the behalf of Doreatha Massed, MD,as directed by  Doreatha Massed, MD while in the presence of Doreatha Massed, MD.   I, Doreatha Massed MD, have  reviewed the above documentation for accuracy and completeness, and I agree with the above.   Doreatha Massed, MD   11/6/20245:24 PM  CHIEF COMPLAINT:   Diagnosis: extensive stage small cell lung cancer     Cancer Staging  Small cell lung cancer, right upper lobe [facility]) Staging form: Lung, AJCC 8th Edition - Clinical stage from 01/22/2022: Stage IVB (cT4, cN2, pM1c) - Signed by Doreatha Massed, MD on 02/16/2022    Prior Therapy: 4 cycles of carboplatin, etoposide and Atezolizumab completed on 04/29/2022   Current Therapy:  Maintenance Atezolizumab every 3 weeks    HISTORY OF PRESENT ILLNESS:   Oncology History  Small cell lung cancer, right upper lobe (HCC)  01/22/2022 Initial Diagnosis   Small cell lung cancer, right upper lobe (HCC)   01/22/2022 Cancer Staging   Staging form: Lung, AJCC 8th Edition - Clinical stage from 01/22/2022: Stage IVB (cT4, cN2, pM1c) - Signed by Doreatha Massed, MD on 02/16/2022 Histopathologic type: Small cell carcinoma, NOS Stage prefix: Initial diagnosis   02/23/2022 - 05/25/2022 Chemotherapy   Patient is on Treatment Plan : LUNG SCLC Carboplatin + Etoposide + Atezolizumab Induction q21d / Atezolizumab Maintenance q21d     02/23/2022 -  Chemotherapy   Patient is on Treatment Plan : LUNG SCLC Carboplatin + Etoposide + Atezolizumab Induction q21d x 4 cycles / Atezolizumab Maintenance q21d        INTERVAL HISTORY:   Larry Moore is a 72 y.o. male presenting to clinic today for follow up of extensive stage small cell lung cancer. He was last seen by me on 06/09/23.  Since his last visit, he underwent surveillance brain MRI on 07/28/23 showing: no evidence of intracranial metastatic disease.  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  Today, he states that he is doing well overall. His appetite level is at 30%. His energy level is at 20%.  PAST MEDICAL HISTORY:   Past Medical History: Past Medical History:  Diagnosis Date   Dyspnea     Dysrhythmia    hx a-fib   Hypertension    Insomnia    Pre-diabetes    Sleep apnea     Surgical History: Past Surgical History:  Procedure Laterality Date   ATRIAL FIBRILLATION ABLATION     approx 2020   CARDIAC CATHETERIZATION     HERNIA REPAIR     IR IMAGING GUIDED PORT INSERTION  02/17/2022   IR VENO/JUGULAR RIGHT  02/17/2022   VIDEO BRONCHOSCOPY WITH ENDOBRONCHIAL ULTRASOUND Bilateral 01/29/2022   Procedure: VIDEO BRONCHOSCOPY WITH ENDOBRONCHIAL ULTRASOUND;  Surgeon: Josephine Igo, DO;  Location: [facility] OR;  Service: Cardiopulmonary;  Laterality: Bilateral;  w/ Guardant [id number]    Social History: Social History   Socioeconomic History   Marital status: Married    Spouse name: Not on file   Number of children: Not on file   Years of education: Not on file   Highest education level: Not on file  Occupational History   Occupation: Retired  Tobacco Use   Smoking status: Every Day    Types: Cigarettes    Passive exposure: Current   Smokeless tobacco: Not on file   Tobacco comments:    Pt smokes 1 ppd. 01/26/22  Vaping Use   Vaping status: Never Used  Substance and Sexual Activity   Alcohol use: Not Currently  Comment: 1 bottle wine a day    Drug use: No   Sexual activity: Not on file  Other Topics Concern   Not on file  Social History Narrative   Not on file   Social Determinants of Health   Financial Resource Strain: Low Risk  (12/16/2022)   Overall Financial Resource Strain (CARDIA)    Difficulty of Paying Living Expenses: Not hard at all  Food Insecurity: No Food Insecurity (12/16/2022)   Hunger Vital Sign    Worried About Running Out of Food in the Last Year: Never true    Ran Out of Food in the Last Year: Never true  Transportation Needs: No Transportation Needs (12/16/2022)   PRAPARE - Administrator, Civil Service (Medical): No    Lack of Transportation (Non-Medical): No  Physical Activity: Not on file  Stress: Not on file  Social  Connections: Not on file  Intimate Partner Violence: Not on file    Family History: Family History  Problem Relation Age of Onset   Coronary artery disease Father    Diabetes Father     Current Medications:  Current Outpatient Medications:    allopurinol (ZYLOPRIM) 300 MG tablet, Take 600 mg by mouth daily., Disp: , Rfl:    alprazolam (XANAX) 2 MG tablet, Take 1 tablet (2 mg total) by mouth at bedtime as needed for anxiety., Disp: 30 tablet, Rfl: 3   amoxicillin (AMOXIL) 500 MG capsule, Take 500 mg by mouth every 8 (eight) hours., Disp: , Rfl:    Atezolizumab (TECENTRIQ IV), Inject into the vein every 21 ( twenty-one) days., Disp: , Rfl:    furosemide (LASIX) 80 MG tablet, Take 80 mg by mouth daily., Disp: , Rfl:    levofloxacin (LEVAQUIN) 500 MG tablet, Take 1 tablet (500 mg total) by mouth daily., Disp: 7 tablet, Rfl: 0   lidocaine-prilocaine (EMLA) cream, Apply topically as directed., Disp: , Rfl:    metolazone (ZAROXOLYN) 2.5 MG tablet, Take 2.5 mg by mouth daily., Disp: , Rfl:    potassium chloride (KLOR-CON M) 10 MEQ tablet, Take 1 tablet (10 mEq total) by mouth 2 (two) times daily., Disp: 180 tablet, Rfl: 3   predniSONE (DELTASONE) 50 MG tablet, Take 1 tablet (50 mg total) by mouth daily with breakfast., Disp: 5 tablet, Rfl: 0   sotalol (BETAPACE) 80 MG tablet, SMARTSIG:1 Tablet(s) By Mouth Every 12 Hours, Disp: , Rfl:    Vitamin D, Ergocalciferol, (DRISDOL) 1.25 MG (50000 UNIT) CAPS capsule, Take 50,000 Units by mouth daily., Disp: , Rfl:    zolpidem (AMBIEN) 10 MG tablet, Take 10 mg by mouth at bedtime as needed., Disp: , Rfl:    Allergies: No Known Allergies  REVIEW OF SYSTEMS:   Review of Systems  Constitutional:  Positive for fatigue. Negative for chills and fever.  HENT:   Negative for lump/mass, mouth sores, nosebleeds, sore throat and trouble swallowing.   Eyes:  Negative for eye problems.  Respiratory:  Positive for cough and shortness of breath.    Cardiovascular:  Negative for chest pain, leg swelling and palpitations.  Gastrointestinal:  Negative for abdominal pain, constipation, diarrhea, nausea and vomiting.  Genitourinary:  Negative for bladder incontinence, difficulty urinating, dysuria, frequency, hematuria and nocturia.   Musculoskeletal:  Negative for arthralgias, back pain, flank pain, myalgias and neck pain.  Skin:  Negative for itching and rash.  Neurological:  Negative for dizziness, headaches and numbness.  Hematological:  Does not bruise/bleed easily.  Psychiatric/Behavioral:  Negative for depression,  sleep disturbance and suicidal ideas. The patient is not nervous/anxious.   All other systems reviewed and are negative.    VITALS:   There were no vitals taken for this visit.  Wt Readings from Last 3 Encounters:  07/21/23 226 lb 3.2 oz (102.6 kg)  06/30/23 225 lb 12.8 oz (102.4 kg)  06/09/23 221 lb 3.2 oz (100.3 kg)    There is no height or weight on file to calculate BMI.  Performance status (ECOG): 1 - Symptomatic but completely ambulatory  PHYSICAL EXAM:   Physical Exam Vitals and nursing note reviewed. Exam conducted with a chaperone present.  Constitutional:      Appearance: Normal appearance.  Cardiovascular:     Rate and Rhythm: Normal rate and regular rhythm.     Pulses: Normal pulses.     Heart sounds: Normal heart sounds.  Pulmonary:     Effort: Pulmonary effort is normal.     Breath sounds: Normal breath sounds.  Abdominal:     Palpations: Abdomen is soft. There is no hepatomegaly, splenomegaly or mass.     Tenderness: There is no abdominal tenderness.  Musculoskeletal:     Right lower leg: No edema.     Left lower leg: No edema.  Lymphadenopathy:     Cervical: No cervical adenopathy.     Right cervical: No superficial, deep or posterior cervical adenopathy.    Left cervical: No superficial, deep or posterior cervical adenopathy.     Upper Body:     Right upper body: No supraclavicular  or axillary adenopathy.     Left upper body: No supraclavicular or axillary adenopathy.  Neurological:     General: No focal deficit present.     Mental Status: He is alert and oriented to person, place, and time.  Psychiatric:        Mood and Affect: Mood normal.        Behavior: Behavior normal.     LABS:      Latest Ref Rng & Units 08/11/2023   12:16 PM 07/21/2023   10:07 AM 06/30/2023    1:37 PM  CBC  WBC 4.0 - 10.5 K/uL 8.9  6.7  6.1   Hemoglobin 13.0 - 17.0 g/dL 96.2  95.2  84.1   Hematocrit 39.0 - 52.0 % 43.1  48.6  47.5   Platelets 150 - 400 K/uL 284  195  185       Latest Ref Rng & Units 08/11/2023   12:16 PM 07/21/2023   10:07 AM 06/30/2023    1:37 PM  CMP  Glucose 70 - 99 mg/dL 324  97  88   BUN 8 - 23 mg/dL 9  9  9    Creatinine 0.61 - 1.24 mg/dL 4.01  0.27  2.53   Sodium 135 - 145 mmol/L 136  139  137   Potassium 3.5 - 5.1 mmol/L 3.4  5.4  4.0   Chloride 98 - 111 mmol/L 100  105  102   CO2 22 - 32 mmol/L 30  27  27    Calcium 8.9 - 10.3 mg/dL 7.6  8.2  8.0   Total Protein 6.5 - 8.1 g/dL 6.3  6.4  6.4   Total Bilirubin <1.2 mg/dL 0.8  0.6  1.1   Alkaline Phos 38 - 126 U/L 63  72  67   AST 15 - 41 U/L 11  10  11    ALT 0 - 44 U/L 11  10  9  No results found for: "CEA1", "CEA" / No results found for: "CEA1", "CEA" No results found for: "PSA1" No results found for: "NGE952" No results found for: "CAN125"  No results found for: "TOTALPROTELP", "ALBUMINELP", "A1GS", "A2GS", "BETS", "BETA2SER", "GAMS", "MSPIKE", "SPEI" No results found for: "TIBC", "FERRITIN", "IRONPCTSAT" No results found for: "LDH"   STUDIES:   CT CHEST ABDOMEN PELVIS W CONTRAST  Result Date: 08/08/2023 CLINICAL DATA:  Metastatic disease evaluation, small-cell lung cancer * Tracking Code: BO * EXAM: CT CHEST, ABDOMEN, AND PELVIS WITH CONTRAST TECHNIQUE: Multidetector CT imaging of the chest, abdomen and pelvis was performed following the standard protocol during bolus administration of  intravenous contrast. RADIATION DOSE REDUCTION: This exam was performed according to the departmental dose-optimization program which includes automated exposure control, adjustment of the mA and/or kV according to patient size and/or use of iterative reconstruction technique. CONTRAST:  OMNIPAQUE IOHEXOL 300 MG/ML  SOLN COMPARISON:  CT chest, 03/31/2023, CT chest abdomen pelvis, 02/01/2023 FINDINGS: CT CHEST FINDINGS Cardiovascular: Left chest port catheter. Aortic atherosclerosis. Cardiomegaly. Three-vessel coronary artery calcifications. Similar small pericardial effusion. Lipomatous hypertrophy of the intra-atrial septum. Mediastinum/Nodes: No enlarged mediastinal, hilar, or axillary lymph nodes. Unchanged extramedullary hematopoiesis about the lower thoracic spine (series 2, image 43). Thyroid gland, trachea, and esophagus demonstrate no significant findings. Lungs/Pleura: Significant interval increase in heterogeneous ground-glass and consolidative airspace opacity throughout the perihilar right lung, particularly involving the superior segment of the right lower lobe (series 3, image 71). Multiple previously described small nodules in this vicinity are not clearly appreciated against the background of airspace disease. Mild diffuse bilateral bronchial wall thickening. Mild bibasilar scarring or atelectasis. New small right pleural effusion. Musculoskeletal: No chest wall abnormality. No acute osseous findings. CT ABDOMEN PELVIS FINDINGS Hepatobiliary: No solid liver abnormality is seen. Numerous small low-attenuation liver cysts, unchanged. No gallstones, gallbladder wall thickening, or biliary dilatation. Pancreas: Unremarkable. No pancreatic ductal dilatation or surrounding inflammatory changes. Spleen: Normal in size without significant abnormality. Adrenals/Urinary Tract: Unchanged left adrenal nodule measuring 1.4 x 1.3 cm (series 2, image 58). Unchanged right adrenal nodule measuring 1.5 x 1.1 cm  (series 2, image 56). Kidneys are normal, without renal calculi, solid lesion, or hydronephrosis. Bladder is unremarkable. Stomach/Bowel: Stomach is within normal limits. Appendix appears normal. No evidence of bowel wall thickening, distention, or inflammatory changes. Descending and sigmoid diverticulosis. Vascular/Lymphatic: Aortic atherosclerosis. No enlarged abdominal or pelvic lymph nodes. Reproductive: No mass or other abnormality. Other: Small fat containing bilateral inguinal hernias.  No ascites. Musculoskeletal: No acute osseous findings. IMPRESSION: 1. Significant interval increase in heterogeneous ground-glass and consolidative airspace opacity throughout the perihilar right lung, particularly involving the superior segment of the right lower lobe. Multiple previously described small nodules in this vicinity are not clearly appreciated against the background of airspace disease. 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 2. New small right pleural effusion. 3. Unchanged bilateral adrenal nodules. Continued attention on follow-up. 4. No evidence of new lymphadenopathy or metastatic disease in the abdomen or pelvis. 5. Cardiomegaly and coronary artery disease. 6. Thoracic paraspinal extramedullary hematopoiesis, unchanged. Aortic Atherosclerosis (ICD10-I70.0). Electronically Signed   By: Jearld Lesch M.D.   On: 08/08/2023 16:45   MR Brain W Wo Contrast  Result Date: 07/29/2023 CLINICAL DATA:  Provided history: Small cell lung cancer, right upper lobe. Small cell lung cancer, monitor. EXAM: MRI HEAD WITHOUT AND WITH CONTRAST TECHNIQUE: Multiplanar, multiecho pulse sequences of the brain and surrounding structures were obtained without and with  intravenous contrast. CONTRAST:  10mL GADAVIST GADOBUTROL 1 MMOL/ML IV SOLN COMPARISON:  Prior brain MRI examinations 04/02/2023 and earlier. FINDINGS: Brain: Mild generalized cerebral volume loss. Chronic  lacunar infarct (with associated chronic blood products) again demonstrated within the right lentiform nucleus. There is no acute infarct. No evidence of an intracranial mass. No extra-axial fluid collection. No midline shift. No pathologic intracranial enhancement identified. Vascular: Maintained flow voids within the proximal large arterial vessels. Skull and upper cervical spine: No focal worrisome marrow lesion. Sinuses/Orbits: No mass or acute finding within the imaged orbits. Prior bilateral ocular lens replacement. Mild mucosal thickening within the bilateral frontal sinuses. Mucosal thickening within the bilateral ethmoid sinuses, overall mild-to-moderate in severity. Mild mucosal thickening within the bilateral maxillary sinuses. IMPRESSION: 1. No evidence of intracranial metastatic disease. 2. 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 3. Mild generalized cerebral atrophy. 4. Paranasal sinus disease as described. Electronically Signed   By: Jackey Loge D.O.   On: 07/29/2023 07:55

## 2023-08-11 NOTE — Progress Notes (Signed)
Treatment held by MD today.

## 2023-08-19 ENCOUNTER — Other Ambulatory Visit: Payer: Self-pay

## 2023-08-19 DIAGNOSIS — C3411 Malignant neoplasm of upper lobe, right bronchus or lung: Secondary | ICD-10-CM

## 2023-08-20 ENCOUNTER — Other Ambulatory Visit: Payer: Self-pay

## 2023-08-20 ENCOUNTER — Other Ambulatory Visit: Payer: PPO

## 2023-09-01 ENCOUNTER — Inpatient Hospital Stay: Payer: PPO

## 2023-09-01 VITALS — BP 91/58 | HR 87 | Temp 98.4°F | Resp 18 | Wt 220.2 lb

## 2023-09-01 DIAGNOSIS — C3411 Malignant neoplasm of upper lobe, right bronchus or lung: Secondary | ICD-10-CM

## 2023-09-01 DIAGNOSIS — Z5112 Encounter for antineoplastic immunotherapy: Secondary | ICD-10-CM | POA: Diagnosis not present

## 2023-09-01 LAB — COMPREHENSIVE METABOLIC PANEL
ALT: 11 U/L (ref 0–44)
AST: 9 U/L — ABNORMAL LOW (ref 15–41)
Albumin: 2.6 g/dL — ABNORMAL LOW (ref 3.5–5.0)
Alkaline Phosphatase: 68 U/L (ref 38–126)
Anion gap: 7 (ref 5–15)
BUN: 9 mg/dL (ref 8–23)
CO2: 26 mmol/L (ref 22–32)
Calcium: 7.8 mg/dL — ABNORMAL LOW (ref 8.9–10.3)
Chloride: 100 mmol/L (ref 98–111)
Creatinine, Ser: 0.74 mg/dL (ref 0.61–1.24)
GFR, Estimated: >60 mL/min (ref >60)
Glucose, Bld: 127 mg/dL — ABNORMAL HIGH (ref 70–99)
Potassium: 4.1 mmol/L (ref 3.5–5.1)
Sodium: 133 mmol/L — ABNORMAL LOW (ref 135–145)
Total Bilirubin: 0.7 mg/dL (ref <1.2)
Total Protein: 6.3 g/dL — ABNORMAL LOW (ref 6.5–8.1)

## 2023-09-01 LAB — CBC WITH DIFFERENTIAL/PLATELET
Abs Immature Granulocytes: 0.06 10*3/uL (ref 0.00–0.07)
Basophils Absolute: 0 10*3/uL (ref 0.0–0.1)
Basophils Relative: 0 %
Eosinophils Absolute: 0.1 10*3/uL (ref 0.0–0.5)
Eosinophils Relative: 2 %
HCT: 41.7 % (ref 39.0–52.0)
Hemoglobin: 14 g/dL (ref 13.0–17.0)
Immature Granulocytes: 1 %
Lymphocytes Relative: 7 %
Lymphs Abs: 0.6 10*3/uL — ABNORMAL LOW (ref 0.7–4.0)
MCH: 30.8 pg (ref 26.0–34.0)
MCHC: 33.6 g/dL (ref 30.0–36.0)
MCV: 91.6 fL (ref 80.0–100.0)
Monocytes Absolute: 0.7 10*3/uL (ref 0.1–1.0)
Monocytes Relative: 8 %
Neutro Abs: 6.5 10*3/uL (ref 1.7–7.7)
Neutrophils Relative %: 82 %
Platelets: 305 10*3/uL (ref 150–400)
RBC: 4.55 MIL/uL (ref 4.22–5.81)
RDW: 13.6 % (ref 11.5–15.5)
WBC: 8 10*3/uL (ref 4.0–10.5)
nRBC: 0 % (ref 0.0–0.2)

## 2023-09-01 LAB — MAGNESIUM: Magnesium: 1.7 mg/dL (ref 1.7–2.4)

## 2023-09-01 MED ORDER — HEPARIN SOD (PORK) LOCK FLUSH 100 UNIT/ML IV SOLN
500.0000 [IU] | Freq: Once | INTRAVENOUS | Status: AC | PRN
  Administered 2023-09-01: 500 [IU]

## 2023-09-01 MED ORDER — SODIUM CHLORIDE 0.9 % IV SOLN: Freq: Once | INTRAVENOUS | Status: AC

## 2023-09-01 MED ORDER — ATEZOLIZUMAB CHEMO INJECTION 1200 MG/20ML
1200.0000 mg | Freq: Once | INTRAVENOUS | Status: AC
  Administered 2023-09-01: 1200 mg via INTRAVENOUS
  Filled 2023-09-01: qty 20

## 2023-09-01 NOTE — Patient Instructions (Signed)
[facility] - A DEPT OF [facility]  Discharge Instructions: Thank you for choosing [facility] to provide your oncology and hematology care.  If you have a lab appointment with the Cancer Center - please note that after April 8th, 2024, all labs will be drawn in the cancer center.  You do not have to check in or register with the main entrance as you have in the past but will complete your check-in in the cancer center.  Wear comfortable clothing and clothing appropriate for easy access to any Portacath or PICC line.   We strive to give you quality time with your provider. You may need to reschedule your appointment if you arrive late (15 or more minutes).  Arriving late affects you and other patients whose appointments are after yours.  Also, if you miss three or more appointments without notifying the office, you may be dismissed from the clinic at the provider's discretion.      For prescription refill requests, have your pharmacy contact our office and allow 72 hours for refills to be completed.    Today you received the following chemotherapy and/or immunotherapy agents Tecentriq   To help prevent nausea and vomiting after your treatment, we encourage you to take your nausea medication as directed.  BELOW ARE SYMPTOMS THAT SHOULD BE REPORTED IMMEDIATELY: *FEVER GREATER THAN 100.4 F (38 C) OR HIGHER *CHILLS OR SWEATING *NAUSEA AND VOMITING THAT IS NOT CONTROLLED WITH YOUR NAUSEA MEDICATION *UNUSUAL SHORTNESS OF BREATH *UNUSUAL BRUISING OR BLEEDING *URINARY PROBLEMS (pain or burning when urinating, or frequent urination) *BOWEL PROBLEMS (unusual diarrhea, constipation, pain near the anus) TENDERNESS IN MOUTH AND THROAT WITH OR WITHOUT PRESENCE OF ULCERS (sore throat, sores in mouth, or a toothache) UNUSUAL RASH, SWELLING OR PAIN  UNUSUAL VAGINAL DISCHARGE OR ITCHING   Items with * indicate a potential emergency and should be followed up  as soon as possible or go to the Emergency Department if any problems should occur.  Please show the CHEMOTHERAPY ALERT CARD or IMMUNOTHERAPY ALERT CARD at check-in to the Emergency Department and triage nurse.  Should you have questions after your visit or need to cancel or reschedule your appointment, please contact [facility] - A DEPT OF [facility] [phone]  and follow the prompts.  Office hours are 8:00 a.m. to 4:30 p.m. Monday - Friday. Please note that voicemails left after 4:00 p.m. may not be returned until the following business day.  We are closed weekends and major holidays. You have access to a nurse at all times for urgent questions. Please call the main number to the clinic [phone] and follow the prompts.  For any non-urgent questions, you may also contact your provider using MyChart. We now offer e-Visits for anyone 19 and older to request care online for non-urgent symptoms. For details visit [email or web address].   Also download the MyChart app! Go to the app store, search "MyChart", open the app, select [place], and log in with your MyChart username and password.

## 2023-09-01 NOTE — Progress Notes (Signed)
Patient presents today for treatment.  He was seen last by Dr. Ellin Saba and was started on antibiotics and steroids.  He has finished those and has felt better since then.  No fevers or SOB.  He reports some fatigue and decrease in appetite.  We talked about adding boost or ensure to his daily diet to help him with the missed calories from meals.  He agrees with that plan.  I have informed pharmacy that he is better post treatment and he would like to proceed with treatment.  Handoff given to L. Wittenbrook, Charity fundraiser.

## 2023-09-01 NOTE — Progress Notes (Signed)
Patient tolerated chemotherapy with no complaints voiced.  Side effects with management reviewed with understanding verbalized.  Port site clean and dry with no bruising or swelling noted at site.  Good blood return noted before and after administration of chemotherapy.  Band aid applied.  Patient left in satisfactory condition with VSS and no s/s of distress noted. All follow ups as scheduled.   [facility] [employer]

## 2023-09-01 NOTE — Progress Notes (Signed)
Ok to proceed with Tecentriq today - patient reports feeling well post antibiotics and steroids from 08/11/23 per MD.  Pryor Ochoa, PharmD

## 2023-09-23 ENCOUNTER — Inpatient Hospital Stay: Payer: PPO

## 2023-09-23 ENCOUNTER — Inpatient Hospital Stay: Payer: PPO | Admitting: Hematology

## 2023-09-23 ENCOUNTER — Ambulatory Visit: Payer: PPO

## 2023-09-24 ENCOUNTER — Other Ambulatory Visit: Payer: Self-pay

## 2023-09-27 ENCOUNTER — Ambulatory Visit (HOSPITAL_COMMUNITY)
Admission: RE | Admit: 2023-09-27 | Discharge: 2023-09-27 | Disposition: A | Discharge status: Home / Self Care | Payer: PPO | Source: Ambulatory Visit | Attending: Hematology | Admitting: Hematology

## 2023-09-27 DIAGNOSIS — K5730 Diverticulosis of large intestine without perforation or abscess without bleeding: Secondary | ICD-10-CM | POA: Diagnosis not present

## 2023-09-27 DIAGNOSIS — J90 Pleural effusion, not elsewhere classified: Secondary | ICD-10-CM | POA: Diagnosis not present

## 2023-09-27 DIAGNOSIS — K7689 Other specified diseases of liver: Secondary | ICD-10-CM | POA: Diagnosis not present

## 2023-09-27 DIAGNOSIS — C3411 Malignant neoplasm of upper lobe, right bronchus or lung: Secondary | ICD-10-CM | POA: Insufficient documentation

## 2023-09-27 MED ORDER — HEPARIN SOD (PORK) LOCK FLUSH 100 UNIT/ML IV SOLN
500.0000 [IU] | Freq: Once | INTRAVENOUS | Status: AC
  Administered 2023-09-27: 500 [IU] via INTRAVENOUS

## 2023-09-27 MED ORDER — HEPARIN SOD (PORK) LOCK FLUSH 100 UNIT/ML IV SOLN
INTRAVENOUS | Status: AC
  Filled 2023-09-27: qty 5

## 2023-09-27 MED ORDER — IOHEXOL 300 MG/ML  SOLN
100.0000 mL | Freq: Once | INTRAMUSCULAR | Status: AC | PRN
  Administered 2023-09-27: 100 mL via INTRAVENOUS

## 2023-10-05 ENCOUNTER — Inpatient Hospital Stay: Payer: PPO

## 2023-10-05 ENCOUNTER — Inpatient Hospital Stay: Payer: PPO | Attending: Hematology

## 2023-10-05 ENCOUNTER — Inpatient Hospital Stay: Payer: PPO | Admitting: Hematology

## 2023-10-05 ENCOUNTER — Encounter: Payer: Self-pay | Admitting: *Deleted

## 2023-10-05 ENCOUNTER — Other Ambulatory Visit: Payer: Self-pay | Admitting: *Deleted

## 2023-10-05 DIAGNOSIS — C3411 Malignant neoplasm of upper lobe, right bronchus or lung: Secondary | ICD-10-CM | POA: Insufficient documentation

## 2023-10-05 DIAGNOSIS — F17210 Nicotine dependence, cigarettes, uncomplicated: Secondary | ICD-10-CM | POA: Diagnosis not present

## 2023-10-05 DIAGNOSIS — E876 Hypokalemia: Secondary | ICD-10-CM | POA: Insufficient documentation

## 2023-10-05 DIAGNOSIS — G479 Sleep disorder, unspecified: Secondary | ICD-10-CM | POA: Insufficient documentation

## 2023-10-05 DIAGNOSIS — Z95828 Presence of other vascular implants and grafts: Secondary | ICD-10-CM

## 2023-10-05 DIAGNOSIS — R590 Localized enlarged lymph nodes: Secondary | ICD-10-CM | POA: Insufficient documentation

## 2023-10-05 DIAGNOSIS — E279 Disorder of adrenal gland, unspecified: Secondary | ICD-10-CM | POA: Insufficient documentation

## 2023-10-05 DIAGNOSIS — Z8051 Family history of malignant neoplasm of kidney: Secondary | ICD-10-CM | POA: Diagnosis not present

## 2023-10-05 DIAGNOSIS — R0902 Hypoxemia: Secondary | ICD-10-CM

## 2023-10-05 LAB — CBC WITH DIFFERENTIAL/PLATELET
Abs Immature Granulocytes: 0.08 10*3/uL — ABNORMAL HIGH (ref 0.00–0.07)
Basophils Absolute: 0 10*3/uL (ref 0.0–0.1)
Basophils Relative: 0 %
Eosinophils Absolute: 0.2 10*3/uL (ref 0.0–0.5)
Eosinophils Relative: 2 %
HCT: 46.2 % (ref 39.0–52.0)
Hemoglobin: 14.6 g/dL (ref 13.0–17.0)
Immature Granulocytes: 1 %
Lymphocytes Relative: 6 %
Lymphs Abs: 0.6 10*3/uL — ABNORMAL LOW (ref 0.7–4.0)
MCH: 29.1 pg (ref 26.0–34.0)
MCHC: 31.6 g/dL (ref 30.0–36.0)
MCV: 92 fL (ref 80.0–100.0)
Monocytes Absolute: 0.6 10*3/uL (ref 0.1–1.0)
Monocytes Relative: 6 %
Neutro Abs: 7.9 10*3/uL — ABNORMAL HIGH (ref 1.7–7.7)
Neutrophils Relative %: 85 %
Platelets: 365 10*3/uL (ref 150–400)
RBC: 5.02 MIL/uL (ref 4.22–5.81)
RDW: 15.8 % — ABNORMAL HIGH (ref 11.5–15.5)
WBC: 9.4 10*3/uL (ref 4.0–10.5)
nRBC: 0 % (ref 0.0–0.2)

## 2023-10-05 LAB — COMPREHENSIVE METABOLIC PANEL
ALT: 6 U/L (ref 0–44)
AST: 9 U/L — ABNORMAL LOW (ref 15–41)
Albumin: 2.8 g/dL — ABNORMAL LOW (ref 3.5–5.0)
Alkaline Phosphatase: 69 U/L (ref 38–126)
Anion gap: 7 (ref 5–15)
BUN: 9 mg/dL (ref 8–23)
CO2: 29 mmol/L (ref 22–32)
Calcium: 8.2 mg/dL — ABNORMAL LOW (ref 8.9–10.3)
Chloride: 98 mmol/L (ref 98–111)
Creatinine, Ser: 0.73 mg/dL (ref 0.61–1.24)
GFR, Estimated: >60 mL/min (ref >60)
Glucose, Bld: 152 mg/dL — ABNORMAL HIGH (ref 70–99)
Potassium: 4.1 mmol/L (ref 3.5–5.1)
Sodium: 134 mmol/L — ABNORMAL LOW (ref 135–145)
Total Bilirubin: 0.9 mg/dL (ref 0.0–1.2)
Total Protein: 6.9 g/dL (ref 6.5–8.1)

## 2023-10-05 LAB — MAGNESIUM: Magnesium: 1.9 mg/dL (ref 1.7–2.4)

## 2023-10-05 MED ORDER — SODIUM CHLORIDE 0.9% FLUSH
10.0000 mL | INTRAVENOUS | Status: DC | PRN
  Administered 2023-10-05: 10 mL via INTRAVENOUS

## 2023-10-05 MED ORDER — PREDNISONE 50 MG PO TABS: 50.0000 mg | ORAL_TABLET | Freq: Every day | ORAL | 0 refills | Status: DC

## 2023-10-05 MED ORDER — PREDNISONE 20 MG PO TABS: 20.0000 mg | ORAL_TABLET | Freq: Every day | ORAL | 0 refills | Status: DC

## 2023-10-05 MED ORDER — HEPARIN SOD (PORK) LOCK FLUSH 100 UNIT/ML IV SOLN
500.0000 [IU] | Freq: Once | INTRAVENOUS | Status: AC
Start: 2023-10-05 — End: 2023-10-05
  Administered 2023-10-05: 500 [IU] via INTRAVENOUS

## 2023-10-05 MED ORDER — LEVOFLOXACIN 500 MG PO TABS: 500.0000 mg | ORAL_TABLET | Freq: Every day | ORAL | 0 refills | Status: DC

## 2023-10-05 NOTE — Progress Notes (Signed)
 [facility] [address] [place] [address]    Clinic Day:  10/05/2023  Referring physician: Bertell Satterfield, MD  Patient Care Team: Bertell Satterfield, MD as PCP - General (Internal Medicine) Rogers Hai, MD as Medical Oncologist (Medical Oncology) Celestia Joesph SQUIBB, RN as Oncology Nurse Navigator (Medical Oncology)   ASSESSMENT & PLAN:   Assessment: 1. Extensive stage small cell lung cancer: - Productive cough for 3 weeks with hemoptysis.  Hemoptysis improved after Eliquis stopped 2 weeks ago.  Still has some purulent sputum.  Currently on Levaquin . - Abnormal chest x-ray on 01/19/2022 for productive cough - CT chest with contrast (01/21/2022): Right hilar mass measuring 6.5 x 5.2 cm surrounding and compressing the right upper lobe bronchus, bronchus intermedius, right middle lobe bronchus and right lower lobe bronchus centrally.  Compression of the right lower lobe pulmonary artery.  Nodular soft tissue lesion in the right lower lobe continuous with right hilar mass and could represent endobronchial spread.  Enlarged mediastinal lymph nodes but no findings of pulmonary metastatic disease.  Indeterminate bilateral adrenal gland nodules and low attenuation liver lesions, likely benign cyst. - No weight loss.  No chest pains. - PET scan: Right hilar mass involving upper lobe, middle lobe and lower lobe with satellite nodularity in the right lower lobe, hypermetabolic subcarinal and right paratracheal lymphadenopathy.  Pleural-based nodule along the right posterior costophrenic angle.  Hypermetabolic left adrenal lesion.  Small hypermetabolic lesions in the humerus bilaterally and a subcutaneous focus in the right posterior lower back. - Right lung hilar mass biopsy (01/29/2022): Small cell carcinoma -4 cycles of carboplatin , etoposide  and atezolizumab  from 02/23/2022 through 04/29/2022, maintenance Atezolizumab  ongoing   2. Social/family history: - He lives at home  with his wife [place].  He is retired administrator, arts at [employer].  Retired 8 years ago.  Smoked 1 pack/day for 50 years. - Father had renal cell carcinoma.    Plan: Extensive stage small cell lung cancer: - He received atezolizumab  on 09/01/2023. - He reported cough with yellow sputum for the last 1 week.  Reportedly his saturations were in mid 70s at home. - With 2 L oxygen  started in our office, his saturations improved to 92%. - Reviewed labs today: LFTs and electrolytes are normal.  CBC was normal. - Reviewed CT CAP from 09/27/2023: New moderate right pleural effusion and increased consolidation in the right lung and parts of the left lung.  No adenopathy or metastatic disease in the abdomen or pelvis. - Findings highly favor pneumonitis with possible superimposed infection. - Will hold immunotherapy indefinitely.  Will start him on steroids prednisone  50 mg daily for 5 days followed by 20 mg daily.  Will also give him Levaquin  500 mg daily for 7 days. - He is reluctant to get admitted to the hospital and would like to go home if he can get oxygen  delivered to his house. - I will reevaluate him in 2 weeks.   2.  Difficulty sleeping: - He is taking THC supplements which are helping.  3.  Hypokalemia: - Continue potassium supplements.  Potassium is normal.    No orders of the defined types were placed in this encounter.     LILLETTE Verneta SAUNDERS Teague,acting as a neurosurgeon for Hai Rogers, MD.,have documented all relevant documentation on the behalf of Hai Rogers, MD,as directed by  Hai Rogers, MD while in the presence of Hai Rogers, MD.  I, Hai Rogers MD, have reviewed the above documentation for accuracy and  completeness, and I agree with the above.    Alean Stands, MD   12/31/20241:53 PM  CHIEF COMPLAINT:   Diagnosis: extensive stage small cell lung cancer    Cancer Staging  Small cell lung cancer, right upper lobe  (HCC) Staging form: Lung, AJCC 8th Edition - Clinical stage from 01/22/2022: Stage IVB (cT4, cN2, pM1c) - Signed by Stands Alean, MD on 02/16/2022    Prior Therapy: 4 cycles of carboplatin , etoposide  and Atezolizumab  completed on 04/29/2022   Current Therapy:  Maintenance Atezolizumab  every 3 weeks    HISTORY OF PRESENT ILLNESS:   Oncology History  Small cell lung cancer, right upper lobe (HCC)  01/22/2022 Initial Diagnosis   Small cell lung cancer, right upper lobe (HCC)   01/22/2022 Cancer Staging   Staging form: Lung, AJCC 8th Edition - Clinical stage from 01/22/2022: Stage IVB (cT4, cN2, pM1c) - Signed by Stands Alean, MD on 02/16/2022 Histopathologic type: Small cell carcinoma, NOS Stage prefix: Initial diagnosis   02/23/2022 - 05/25/2022 Chemotherapy   Patient is on Treatment Plan : LUNG SCLC Carboplatin  + Etoposide  + Atezolizumab  Induction q21d / Atezolizumab  Maintenance q21d     02/23/2022 -  Chemotherapy   Patient is on Treatment Plan : LUNG SCLC Carboplatin  + Etoposide  + Atezolizumab  Induction q21d x 4 cycles / Atezolizumab  Maintenance q21d        INTERVAL HISTORY:   [facility] is a 72 y.o. male presenting to clinic today for follow up of extensive stage small cell lung cancer. He was last seen by me on 08/11/23.  He also underwent restaging CT C/A/P on 09/27/23 that found: new moderate size layering RIGHT pleural effusion; increased consolidation in the RIGHT lung and new perihilar consolidation and ground-glass densities in the LEFT lung; no evidence of thoracic metastatic adenopathy; no evidence of metastatic disease in the abdomen pelvis; stable benign hepatic cysts; and stable benign adrenal adenomas.  Today, he states that he is doing well overall. His appetite level is at 25%. His energy level is at 0%. He is accompanied by his wife.  After treatment on 11/27, his breathing improved briefly. Today, he reports SOB and oxygen  saturation is at 90 when on 2 L of  oxygen . He reports a cough with yellow sputum for the last week.   His wife notes his oxygen  saturation at home yesterday was at 74%. He denies any fever.   PAST MEDICAL HISTORY:   Past Medical History: Past Medical History:  Diagnosis Date   Dyspnea    Dysrhythmia    hx a-fib   Hypertension    Insomnia    Pre-diabetes    Sleep apnea     Surgical History: Past Surgical History:  Procedure Laterality Date   ATRIAL FIBRILLATION ABLATION     approx 2020   CARDIAC CATHETERIZATION     HERNIA REPAIR     IR IMAGING GUIDED PORT INSERTION  02/17/2022   IR VENO/JUGULAR RIGHT  02/17/2022   VIDEO BRONCHOSCOPY WITH ENDOBRONCHIAL ULTRASOUND Bilateral 01/29/2022   Procedure: VIDEO BRONCHOSCOPY WITH ENDOBRONCHIAL ULTRASOUND;  Surgeon: Brenna Adine CROME, DO;  Location: [facility] OR;  Service: Cardiopulmonary;  Laterality: Bilateral;  w/ Guardant [id number]    Social History: Social History   Socioeconomic History   Marital status: Married    Spouse name: Not on file   Number of children: Not on file   Years of education: Not on file   Highest education level: Not on file  Occupational History   Occupation: Retired  Tobacco Use  Smoking status: Every Day    Types: Cigarettes    Passive exposure: Current   Smokeless tobacco: Not on file   Tobacco comments:    Pt smokes 1 ppd. 01/26/22  Vaping Use   Vaping status: Never Used  Substance and Sexual Activity   Alcohol use: Not Currently    Comment: 1 bottle wine a day    Drug use: No   Sexual activity: Not on file  Other Topics Concern   Not on file  Social History Narrative   Not on file   Social Drivers of Health   Financial Resource Strain: Low Risk  (12/16/2022)   Overall Financial Resource Strain (CARDIA)    Difficulty of Paying Living Expenses: Not hard at all  Food Insecurity: No Food Insecurity (12/16/2022)   Hunger Vital Sign    Worried About Running Out of Food in the Last Year: Never true    Ran Out of Food in the Last Year:  Never true  Transportation Needs: No Transportation Needs (12/16/2022)   PRAPARE - Administrator, Civil Service (Medical): No    Lack of Transportation (Non-Medical): No  Physical Activity: Not on file  Stress: Not on file  Social Connections: Not on file  Intimate Partner Violence: Not on file    Family History: Family History  Problem Relation Age of Onset   Coronary artery disease Father    Diabetes Father     Current Medications:  Current Outpatient Medications:    levofloxacin  (LEVAQUIN ) 500 MG tablet, Take 1 tablet (500 mg total) by mouth daily., Disp: 7 tablet, Rfl: 0   predniSONE  (DELTASONE ) 20 MG tablet, Take 1 tablet (20 mg total) by mouth daily with breakfast., Disp: 30 tablet, Rfl: 0   predniSONE  (DELTASONE ) 50 MG tablet, Take 1 tablet (50 mg total) by mouth daily with breakfast., Disp: 5 tablet, Rfl: 0   allopurinol  (ZYLOPRIM ) 300 MG tablet, Take 600 mg by mouth daily., Disp: , Rfl:    alprazolam  (XANAX ) 2 MG tablet, Take 1 tablet (2 mg total) by mouth at bedtime as needed for anxiety., Disp: 30 tablet, Rfl: 3   Atezolizumab  (TECENTRIQ  IV), Inject into the vein every 21 ( twenty-one) days., Disp: , Rfl:    furosemide  (LASIX ) 80 MG tablet, Take 80 mg by mouth daily., Disp: , Rfl:    lidocaine -prilocaine  (EMLA ) cream, Apply topically as directed., Disp: , Rfl:    metolazone  (ZAROXOLYN ) 2.5 MG tablet, Take 2.5 mg by mouth daily., Disp: , Rfl:    potassium chloride  (KLOR-CON  M) 10 MEQ tablet, Take 1 tablet (10 mEq total) by mouth 2 (two) times daily., Disp: 180 tablet, Rfl: 3   sotalol  (BETAPACE ) 80 MG tablet, SMARTSIG:1 Tablet(s) By Mouth Every 12 Hours, Disp: , Rfl:    Vitamin D, Ergocalciferol, (DRISDOL) 1.25 MG (50000 UNIT) CAPS capsule, Take 50,000 Units by mouth daily., Disp: , Rfl:    zolpidem  (AMBIEN ) 10 MG tablet, Take 10 mg by mouth at bedtime as needed., Disp: , Rfl:  No current facility-administered medications for this visit.  Facility-Administered  Medications Ordered in Other Visits:    heparin  lock flush 100 unit/mL, 500 Units, Intravenous, Once, Rogers Hai, MD   Allergies: No Known Allergies  REVIEW OF SYSTEMS:   Review of Systems  Constitutional:  Negative for chills, fatigue and fever.  HENT:   Negative for lump/mass, mouth sores, nosebleeds, sore throat and trouble swallowing.   Eyes:  Negative for eye problems.  Respiratory:  Positive for cough  and shortness of breath.   Cardiovascular:  Positive for chest pain and palpitations. Negative for leg swelling.  Gastrointestinal:  Positive for diarrhea. Negative for abdominal pain, constipation, nausea and vomiting.  Genitourinary:  Negative for bladder incontinence, difficulty urinating, dysuria, frequency, hematuria and nocturia.   Musculoskeletal:  Negative for arthralgias, back pain, flank pain, myalgias and neck pain.  Skin:  Negative for itching and rash.  Neurological:  Negative for dizziness, headaches and numbness.  Hematological:  Does not bruise/bleed easily.  Psychiatric/Behavioral:  Negative for depression, sleep disturbance and suicidal ideas. The patient is not nervous/anxious.   All other systems reviewed and are negative.    VITALS:   There were no vitals taken for this visit.  Wt Readings from Last 3 Encounters:  09/01/23 220 lb 3.2 oz (99.9 kg)  07/21/23 226 lb 3.2 oz (102.6 kg)  06/30/23 225 lb 12.8 oz (102.4 kg)    There is no height or weight on file to calculate BMI.  Performance status (ECOG): 1 - Symptomatic but completely ambulatory  PHYSICAL EXAM:   Physical Exam Vitals and nursing note reviewed. Exam conducted with a chaperone present.  Constitutional:      Appearance: Normal appearance.  Cardiovascular:     Rate and Rhythm: Normal rate and regular rhythm.     Pulses: Normal pulses.     Heart sounds: Normal heart sounds.  Pulmonary:     Effort: Pulmonary effort is normal.     Breath sounds: Normal breath sounds.  Abdominal:      Palpations: Abdomen is soft. There is no hepatomegaly, splenomegaly or mass.     Tenderness: There is no abdominal tenderness.  Musculoskeletal:     Right lower leg: No edema.     Left lower leg: No edema.  Lymphadenopathy:     Cervical: No cervical adenopathy.     Right cervical: No superficial, deep or posterior cervical adenopathy.    Left cervical: No superficial, deep or posterior cervical adenopathy.     Upper Body:     Right upper body: No supraclavicular or axillary adenopathy.     Left upper body: No supraclavicular or axillary adenopathy.  Neurological:     General: No focal deficit present.     Mental Status: He is alert and oriented to person, place, and time.  Psychiatric:        Mood and Affect: Mood normal.        Behavior: Behavior normal.    LABS:      Latest Ref Rng & Units 10/05/2023   12:29 PM 09/01/2023   11:53 AM 08/11/2023   12:16 PM  CBC  WBC 4.0 - 10.5 K/uL 9.4  8.0  8.9   Hemoglobin 13.0 - 17.0 g/dL 85.3  85.9  85.7   Hematocrit 39.0 - 52.0 % 46.2  41.7  43.1   Platelets 150 - 400 K/uL 365  305  284       Latest Ref Rng & Units 10/05/2023   12:29 PM 09/01/2023   11:53 AM 08/11/2023   12:16 PM  CMP  Glucose 70 - 99 mg/dL 847  872  879   BUN 8 - 23 mg/dL 9  9  9    Creatinine 0.61 - 1.24 mg/dL 9.26  9.25  9.25   Sodium 135 - 145 mmol/L 134  133  136   Potassium 3.5 - 5.1 mmol/L 4.1  4.1  3.4   Chloride 98 - 111 mmol/L 98  100  100  CO2 22 - 32 mmol/L 29  26  30    Calcium 8.9 - 10.3 mg/dL 8.2  7.8  7.6   Total Protein 6.5 - 8.1 g/dL 6.9  6.3  6.3   Total Bilirubin 0.0 - 1.2 mg/dL 0.9  0.7  0.8   Alkaline Phos 38 - 126 U/L 69  68  63   AST 15 - 41 U/L 9  9  11    ALT 0 - 44 U/L 6  11  11       No results found for: CEA1, CEA / No results found for: CEA1, CEA No results found for: PSA1 No results found for: CAN199 No results found for: CAN125  No results found for: TOTALPROTELP, ALBUMINELP, A1GS, A2GS, BETS,  BETA2SER, GAMS, MSPIKE, SPEI No results found for: TIBC, FERRITIN, IRONPCTSAT No results found for: LDH   STUDIES:   CT CHEST ABDOMEN PELVIS W CONTRAST Result Date: 10/05/2023 CLINICAL DATA:  Small cell lung cancer. RIGHT upper lobe. * Tracking Code: BO * EXAM: CT CHEST, ABDOMEN, AND PELVIS WITH CONTRAST TECHNIQUE: Multidetector CT imaging of the chest, abdomen and pelvis was performed following the standard protocol during bolus administration of intravenous contrast. RADIATION DOSE REDUCTION: This exam was performed according to the departmental dose-optimization program which includes automated exposure control, adjustment of the mA and/or kV according to patient size and/or use of iterative reconstruction technique. CONTRAST:  100 cc Omnipaque  COMPARISON:  07/28/2023 FINDINGS: CT CHEST FINDINGS Cardiovascular: Small pericardial effusion. Coronary artery calcification and aortic atherosclerotic calcification. Port in the anterior chest wall with tip in distal SVC. Mediastinum/Nodes: No axillary or supraclavicular adenopathy. No mediastinal or hilar adenopathy. No pericardial fluid. Esophagus normal. Small paratracheal nodes are similar prior. Lungs/Pleura: New moderate size layering RIGHT pleural effusion. Perihilar reticular pattern and consolidation with air bronchograms is similar to mildly progressed from prior. Findings suggestive of radiation change. Increased consolidation in part due to passive atelectasis from the moderate effusion on the RIGHT. New paramediastinal fibrotic change in the medial LEFT upper lobe also favored related to radiation therapy. Increased peribronchial thickening in the central LEFT lower lobe is new from prior. Musculoskeletal: No aggressive osseous lesion CT ABDOMEN AND PELVIS FINDINGS Hepatobiliary: Several low-density lesions in liver most suggestive of benign cysts are unchanged from comparison exam. Pancreas: Pancreas is normal. No ductal dilatation.  No pancreatic inflammation. Spleen: Normal spleen Adrenals/urinary tract: Mild nodular enlargement of the LEFT and RIGHT adrenal gland glands have low attenuation on contrast imaging suggesting benign adenomas. Kidneys, ureters and bladder normal. Stomach/Bowel: Stomach, small bowel, appendix, and cecum are normal. Multiple diverticula of the descending colon and sigmoid colon without acute inflammation. Vascular/Lymphatic: Abdominal aorta is normal caliber with atherosclerotic calcification. There is no retroperitoneal or periportal lymphadenopathy. No pelvic lymphadenopathy. Reproductive: Prostate unremarkable Other: No free fluid. Musculoskeletal: No aggressive osseous lesion. IMPRESSION: CHEST: 1. New moderate size layering RIGHT pleural effusion. 2. Increased consolidation in the RIGHT lung and new perihilar consolidation and ground-glass densities in the LEFT lung. Differential includes evolution of radiation change versus drug reaction. Metastatic disease is less favored. 3. No evidence of thoracic metastatic adenopathy. PELVIS: 1. No evidence of metastatic disease in the abdomen pelvis. 2. Stable benign hepatic cysts. 3. Stable benign adrenal adenomas. 4.  Aortic Atherosclerosis (ICD10-I70.0). Electronically Signed   By: Jackquline Boxer M.D.   On: 10/05/2023 10:22

## 2023-10-05 NOTE — Progress Notes (Signed)
 Unable to perform formal 6 minute walk due to severe O2 demand and limited ability to ambulate.  O2  74% at rest in wheelchair with SOB.  Placed on 3 liters to maintain sats of 90-93% in office.  Per Dr. Rogers, will order O2 with conserving device to maintain sats of at least 92%.

## 2023-10-05 NOTE — Patient Instructions (Addendum)
 [facility] at [facility] Discharge Instructions   You were seen and examined today by Dr. Rogers.  He reviewed the results of your lab work which are normal/stable.   He reviewed the results of your CT scan that is showing suspicion of either inflammation caused by the immunotherapy or infection. There is also some fluids that has accumulated on the right lung.   We will hold your treatment today.   We sent a prescription for prednisone  and Levaquin . You take prednisone  50 mg daily with breakfast for 5 days, then take prednisone  20 mg daily until directed otherwise. There are two different prescriptions for the prednisone  at your pharmacy.   Return as scheduled.    Thank you for choosing [facility] at [facility] to provide your oncology and hematology care.  To afford each patient quality time with our provider, please arrive at least 15 minutes before your scheduled appointment time.   If you have a lab appointment with the Cancer Center please come in thru the Main Entrance and check in at the main information desk.  You need to re-schedule your appointment should you arrive 10 or more minutes late.  We strive to give you quality time with our providers, and arriving late affects you and other patients whose appointments are after yours.  Also, if you no show three or more times for appointments you may be dismissed from the clinic at the providers discretion.     Again, thank you for choosing [facility].  Our hope is that these requests will decrease the amount of time that you wait before being seen by our physicians.       _____________________________________________________________  Should you have questions after your visit to [facility], please contact our office at [phone] and follow the prompts.  Our office hours are 8:00 a.m. and 4:30 p.m. Monday - Friday.  Please note that voicemails left  after 4:00 p.m. may not be returned until the following business day.  We are closed weekends and major holidays.  You do have access to a nurse 24-7, just call the main number to the clinic [phone] and do not press any options, hold on the line and a nurse will answer the phone.    For prescription refill requests, have your pharmacy contact our office and allow 72 hours.    Due to Covid, you will need to wear a mask upon entering the hospital. If you do not have a mask, a mask will be given to you at the Main Entrance upon arrival. For doctor visits, patients may have 1 support person age 64 or older with them. For treatment visits, patients can not have anyone with them due to social distancing guidelines and our immunocompromised population.

## 2023-10-06 ENCOUNTER — Other Ambulatory Visit: Payer: Self-pay

## 2023-10-18 NOTE — Progress Notes (Signed)
 [facility] [address] [place] [address]    Clinic Day:  10/19/2023  Referring physician: Bertell Satterfield, MD  Patient Care Team: Bertell Satterfield, MD as PCP - General (Internal Medicine) Rogers Hai, MD as Medical Oncologist (Medical Oncology) Celestia Joesph SQUIBB, RN as Oncology Nurse Navigator (Medical Oncology)   ASSESSMENT & PLAN:   Assessment: 1. Extensive stage small cell lung cancer: - Productive cough for 3 weeks with hemoptysis.  Hemoptysis improved after Eliquis stopped 2 weeks ago.  Still has some purulent sputum.  Currently on Levaquin . - Abnormal chest x-ray on 01/19/2022 for productive cough - CT chest with contrast (01/21/2022): Right hilar mass measuring 6.5 x 5.2 cm surrounding and compressing the right upper lobe bronchus, bronchus intermedius, right middle lobe bronchus and right lower lobe bronchus centrally.  Compression of the right lower lobe pulmonary artery.  Nodular soft tissue lesion in the right lower lobe continuous with right hilar mass and could represent endobronchial spread.  Enlarged mediastinal lymph nodes but no findings of pulmonary metastatic disease.  Indeterminate bilateral adrenal gland nodules and low attenuation liver lesions, likely benign cyst. - No weight loss.  No chest pains. - PET scan: Right hilar mass involving upper lobe, middle lobe and lower lobe with satellite nodularity in the right lower lobe, hypermetabolic subcarinal and right paratracheal lymphadenopathy.  Pleural-based nodule along the right posterior costophrenic angle.  Hypermetabolic left adrenal lesion.  Small hypermetabolic lesions in the humerus bilaterally and a subcutaneous focus in the right posterior lower back. - Right lung hilar mass biopsy (01/29/2022): Small cell carcinoma -4 cycles of carboplatin , etoposide  and atezolizumab  from 02/23/2022 through 04/29/2022, maintenance Atezolizumab  last dose on 09/01/2023.  Held due to pneumonitis.   2.  Social/family history: - He lives at home with his wife Larry Moore.  He is retired administrator, arts at [employer].  Retired 8 years ago.  Smoked 1 pack/day for 50 years. - Father had renal cell carcinoma.    Plan: Extensive stage small cell lung cancer: - Last atezolizumab  on 09/01/2023. - CT CAP (09/27/2023): New moderate right pleural effusion and increased consolidation in the right lung and parts of the left lung.  No adenopathy or metastatic disease. - Findings were consistent with pneumonitis.  We held immunotherapy and started him on prednisone  taper which helped. - I discussed about permanently discontinuing immunotherapy because of 2 episodes of pneumonitis. - Should he have recurrence of disease, options include lurbinectedin.  I also talked about tarlatamab.  He needs induction doses to be given at [facility] as one of the complications see CRS. - I will see him back after CT scan in a month.  Will also consider brain imaging sometime in March.    2.  Pneumonitis: - He is on prednisone  taper.  Has been taking prednisone  10 mg daily for the last 4 days.  His breathing has improved.  O2 sats are 97% on room air. - Recommend continuing prednisone  10 mg for a week followed by prednisone  5 mg alternating with 10 mg for a week, followed by prednisone  5 mg daily until he sees us . - Plan to repeat CT chest in 4 weeks.  3.  Hypokalemia: - Continue potassium supplements daily.  Potassium is 3.3 today.    Orders Placed This Encounter  Procedures   CT CHEST W CONTRAST    Standing Status:   Future    Expected Date:   11/19/2023    Expiration Date:   10/18/2024  If indicated for the ordered procedure, I authorize the administration of contrast media per Radiology protocol:   Yes    Does the patient have a contrast media/X-ray dye allergy?:   No    Preferred imaging location?:   [facility] R Teague,acting as a scribe for Alean Stands, MD.,have  documented all relevant documentation on the behalf of Alean Stands, MD,as directed by  Alean Stands, MD while in the presence of Alean Stands, MD.  I, Alean Stands MD, have reviewed the above documentation for accuracy and completeness, and I agree with the above.     Alean Stands, MD   1/14/202511:34 AM  CHIEF COMPLAINT:   Diagnosis: extensive stage small cell lung cancer    Cancer Staging  Small cell lung cancer, right upper lobe [facility]) Staging form: Lung, AJCC 8th Edition - Clinical stage from 01/22/2022: Stage IVB (cT4, cN2, pM1c) - Signed by Stands Alean, MD on 02/16/2022    Prior Therapy: 4 cycles of carboplatin , etoposide  and Atezolizumab  completed on 04/29/2022   Current Therapy:  Maintenance Atezolizumab  every 3 weeks    HISTORY OF PRESENT ILLNESS:   Oncology History  Small cell lung cancer, right upper lobe (HCC)  01/22/2022 Initial Diagnosis   Small cell lung cancer, right upper lobe (HCC)   01/22/2022 Cancer Staging   Staging form: Lung, AJCC 8th Edition - Clinical stage from 01/22/2022: Stage IVB (cT4, cN2, pM1c) - Signed by Stands Alean, MD on 02/16/2022 Histopathologic type: Small cell carcinoma, NOS Stage prefix: Initial diagnosis   02/23/2022 - 05/25/2022 Chemotherapy   Patient is on Treatment Plan : LUNG SCLC Carboplatin  + Etoposide  + Atezolizumab  Induction q21d / Atezolizumab  Maintenance q21d     02/23/2022 -  Chemotherapy   Patient is on Treatment Plan : LUNG SCLC Carboplatin  + Etoposide  + Atezolizumab  Induction q21d x 4 cycles / Atezolizumab  Maintenance q21d        INTERVAL HISTORY:   Larry Moore is a 73 y.o. male presenting to clinic today for follow up of extensive stage small cell lung cancer. He was last seen by me on 10/05/23.  Today, he states that he is doing well overall. His appetite level is at 100%. His energy level is at 75%. He is accompanied by his wife.  He reports his breathing has  significantly improved. He used 2 L oxygen  for 24 hours and has not used it since. His pulse Ox at home is measuring in the 90's. He has tapered Prednisone  to 10 mg daily for the past 3-4 days without recurrence of breathing issues. He denies any worsening of cough or sputum. He notes palpitations, though he has Afib. He is sleeping well and he wakes in the middle of the night for a meal. He would like a refill on lidocaine  cream.   PAST MEDICAL HISTORY:   Past Medical History: Past Medical History:  Diagnosis Date   Dyspnea    Dysrhythmia    hx a-fib   Hypertension    Insomnia    Pre-diabetes    Sleep apnea     Surgical History: Past Surgical History:  Procedure Laterality Date   ATRIAL FIBRILLATION ABLATION     approx 2020   CARDIAC CATHETERIZATION     HERNIA REPAIR     IR IMAGING GUIDED PORT INSERTION  02/17/2022   IR VENO/JUGULAR RIGHT  02/17/2022   VIDEO BRONCHOSCOPY WITH ENDOBRONCHIAL ULTRASOUND Bilateral 01/29/2022   Procedure: VIDEO BRONCHOSCOPY WITH ENDOBRONCHIAL ULTRASOUND;  Surgeon: Brenna Cain  L, DO;  Location: [facility] OR;  Service: Cardiopulmonary;  Laterality: Bilateral;  w/ Guardant [id number]    Social History: Social History   Socioeconomic History   Marital status: Married    Spouse name: Not on file   Number of children: Not on file   Years of education: Not on file   Highest education level: Not on file  Occupational History   Occupation: Retired  Tobacco Use   Smoking status: Every Day    Types: Cigarettes    Passive exposure: Current   Smokeless tobacco: Not on file   Tobacco comments:    Pt smokes 1 ppd. 01/26/22  Vaping Use   Vaping status: Never Used  Substance and Sexual Activity   Alcohol use: Not Currently    Comment: 1 bottle wine a day    Drug use: No   Sexual activity: Not on file  Other Topics Concern   Not on file  Social History Narrative   Not on file   Social Drivers of Health   Financial Resource Strain: Low Risk  (12/16/2022)    Overall Financial Resource Strain (CARDIA)    Difficulty of Paying Living Expenses: Not hard at all  Food Insecurity: No Food Insecurity (12/16/2022)   Hunger Vital Sign    Worried About Running Out of Food in the Last Year: Never true    Ran Out of Food in the Last Year: Never true  Transportation Needs: No Transportation Needs (12/16/2022)   PRAPARE - Administrator, Civil Service (Medical): No    Lack of Transportation (Non-Medical): No  Physical Activity: Not on file  Stress: Not on file  Social Connections: Not on file  Intimate Partner Violence: Not on file    Family History: Family History  Problem Relation Age of Onset   Coronary artery disease Father    Diabetes Father     Current Medications:  Current Outpatient Medications:    allopurinol  (ZYLOPRIM ) 300 MG tablet, Take 600 mg by mouth daily., Disp: , Rfl:    alprazolam  (XANAX ) 2 MG tablet, Take 1 tablet (2 mg total) by mouth at bedtime as needed for anxiety., Disp: 30 tablet, Rfl: 3   Atezolizumab  (TECENTRIQ  IV), Inject into the vein every 21 ( twenty-one) days., Disp: , Rfl:    furosemide  (LASIX ) 80 MG tablet, Take 80 mg by mouth daily., Disp: , Rfl:    metolazone  (ZAROXOLYN ) 2.5 MG tablet, Take 2.5 mg by mouth daily., Disp: , Rfl:    potassium chloride  (KLOR-CON  M) 10 MEQ tablet, Take 1 tablet (10 mEq total) by mouth 2 (two) times daily., Disp: 180 tablet, Rfl: 3   predniSONE  (DELTASONE ) 20 MG tablet, Take 1 tablet (20 mg total) by mouth daily with breakfast., Disp: 30 tablet, Rfl: 0   predniSONE  (DELTASONE ) 50 MG tablet, Take 1 tablet (50 mg total) by mouth daily with breakfast., Disp: 5 tablet, Rfl: 0   sotalol  (BETAPACE ) 80 MG tablet, SMARTSIG:1 Tablet(s) By Mouth Every 12 Hours, Disp: , Rfl:    Vitamin D, Ergocalciferol, (DRISDOL) 1.25 MG (50000 UNIT) CAPS capsule, Take 50,000 Units by mouth daily., Disp: , Rfl:    zolpidem  (AMBIEN ) 10 MG tablet, Take 10 mg by mouth at bedtime as needed., Disp: , Rfl:     lidocaine -prilocaine  (EMLA ) cream, Apply topically as directed., Disp: 30 g, Rfl: 3   Allergies: No Known Allergies  REVIEW OF SYSTEMS:   Review of Systems  Constitutional:  Negative for chills, fatigue and fever.  HENT:  Negative for lump/mass, mouth sores, nosebleeds, sore throat and trouble swallowing.   Eyes:  Negative for eye problems.  Respiratory:  Negative for cough and shortness of breath.   Cardiovascular:  Positive for palpitations. Negative for chest pain and leg swelling.  Gastrointestinal:  Negative for abdominal pain, constipation, diarrhea, nausea and vomiting.  Genitourinary:  Negative for bladder incontinence, difficulty urinating, dysuria, frequency, hematuria and nocturia.   Musculoskeletal:  Negative for arthralgias, back pain, flank pain, myalgias and neck pain.  Skin:  Negative for itching and rash.  Neurological:  Negative for dizziness, headaches and numbness.  Hematological:  Does not bruise/bleed easily.  Psychiatric/Behavioral:  Negative for depression, sleep disturbance and suicidal ideas. The patient is not nervous/anxious.   All other systems reviewed and are negative.    VITALS:   Blood pressure (!) 141/84, pulse 72, temperature (!) 97.2 F (36.2 C), temperature source Tympanic, resp. rate 20, weight 215 lb 6.2 oz (97.7 kg), SpO2 97%.  Wt Readings from Last 3 Encounters:  10/19/23 215 lb 6.2 oz (97.7 kg)  09/01/23 220 lb 3.2 oz (99.9 kg)  07/21/23 226 lb 3.2 oz (102.6 kg)    Body mass index is 32.75 kg/m.  Performance status (ECOG): 1 - Symptomatic but completely ambulatory  PHYSICAL EXAM:   Physical Exam Vitals and nursing note reviewed. Exam conducted with a chaperone present.  Constitutional:      Appearance: Normal appearance.  Cardiovascular:     Rate and Rhythm: Normal rate and regular rhythm.     Pulses: Normal pulses.     Heart sounds: Normal heart sounds.  Pulmonary:     Effort: Pulmonary effort is normal.     Breath sounds:  Normal breath sounds.  Abdominal:     Palpations: Abdomen is soft. There is no hepatomegaly, splenomegaly or mass.     Tenderness: There is no abdominal tenderness.  Musculoskeletal:     Right lower leg: No edema.     Left lower leg: No edema.  Lymphadenopathy:     Cervical: No cervical adenopathy.     Right cervical: No superficial, deep or posterior cervical adenopathy.    Left cervical: No superficial, deep or posterior cervical adenopathy.     Upper Body:     Right upper body: No supraclavicular or axillary adenopathy.     Left upper body: No supraclavicular or axillary adenopathy.  Neurological:     General: No focal deficit present.     Mental Status: He is alert and oriented to person, place, and time.  Psychiatric:        Mood and Affect: Mood normal.        Behavior: Behavior normal.     LABS:      Latest Ref Rng & Units 10/19/2023    9:37 AM 10/05/2023   12:29 PM 09/01/2023   11:53 AM  CBC  WBC 4.0 - 10.5 K/uL 11.0  9.4  8.0   Hemoglobin 13.0 - 17.0 g/dL 84.6  85.3  85.9   Hematocrit 39.0 - 52.0 % 48.0  46.2  41.7   Platelets 150 - 400 K/uL 186  365  305       Latest Ref Rng & Units 10/19/2023    9:37 AM 10/05/2023   12:29 PM 09/01/2023   11:53 AM  CMP  Glucose 70 - 99 mg/dL 93  847  872   BUN 8 - 23 mg/dL 11  9  9    Creatinine 0.61 - 1.24 mg/dL 9.32  9.26  0.74   Sodium 135 - 145 mmol/L 138  134  133   Potassium 3.5 - 5.1 mmol/L 3.3  4.1  4.1   Chloride 98 - 111 mmol/L 100  98  100   CO2 22 - 32 mmol/L 28  29  26    Calcium 8.9 - 10.3 mg/dL 8.0  8.2  7.8   Total Protein 6.5 - 8.1 g/dL 6.1  6.9  6.3   Total Bilirubin 0.0 - 1.2 mg/dL 1.3  0.9  0.7   Alkaline Phos 38 - 126 U/L 55  69  68   AST 15 - 41 U/L 12  9  9    ALT 0 - 44 U/L 12  6  11       No results found for: CEA1, CEA / No results found for: CEA1, CEA No results found for: PSA1 No results found for: CAN199 No results found for: CAN125  No results found for: TOTALPROTELP,  ALBUMINELP, A1GS, A2GS, BETS, BETA2SER, GAMS, MSPIKE, SPEI No results found for: TIBC, FERRITIN, IRONPCTSAT No results found for: LDH   STUDIES:   CT CHEST ABDOMEN PELVIS W CONTRAST Result Date: 10/05/2023 CLINICAL DATA:  Small cell lung cancer. RIGHT upper lobe. * Tracking Code: BO * EXAM: CT CHEST, ABDOMEN, AND PELVIS WITH CONTRAST TECHNIQUE: Multidetector CT imaging of the chest, abdomen and pelvis was performed following the standard protocol during bolus administration of intravenous contrast. RADIATION DOSE REDUCTION: This exam was performed according to the departmental dose-optimization program which includes automated exposure control, adjustment of the mA and/or kV according to patient size and/or use of iterative reconstruction technique. CONTRAST:  100 cc Omnipaque  COMPARISON:  07/28/2023 FINDINGS: CT CHEST FINDINGS Cardiovascular: Small pericardial effusion. Coronary artery calcification and aortic atherosclerotic calcification. Port in the anterior chest wall with tip in distal SVC. Mediastinum/Nodes: No axillary or supraclavicular adenopathy. No mediastinal or hilar adenopathy. No pericardial fluid. Esophagus normal. Small paratracheal nodes are similar prior. Lungs/Pleura: New moderate size layering RIGHT pleural effusion. Perihilar reticular pattern and consolidation with air bronchograms is similar to mildly progressed from prior. Findings suggestive of radiation change. Increased consolidation in part due to passive atelectasis from the moderate effusion on the RIGHT. New paramediastinal fibrotic change in the medial LEFT upper lobe also favored related to radiation therapy. Increased peribronchial thickening in the central LEFT lower lobe is new from prior. Musculoskeletal: No aggressive osseous lesion CT ABDOMEN AND PELVIS FINDINGS Hepatobiliary: Several low-density lesions in liver most suggestive of benign cysts are unchanged from comparison exam. Pancreas:  Pancreas is normal. No ductal dilatation. No pancreatic inflammation. Spleen: Normal spleen Adrenals/urinary tract: Mild nodular enlargement of the LEFT and RIGHT adrenal gland glands have low attenuation on contrast imaging suggesting benign adenomas. Kidneys, ureters and bladder normal. Stomach/Bowel: Stomach, small bowel, appendix, and cecum are normal. Multiple diverticula of the descending colon and sigmoid colon without acute inflammation. Vascular/Lymphatic: Abdominal aorta is normal caliber with atherosclerotic calcification. There is no retroperitoneal or periportal lymphadenopathy. No pelvic lymphadenopathy. Reproductive: Prostate unremarkable Other: No free fluid. Musculoskeletal: No aggressive osseous lesion. IMPRESSION: CHEST: 1. New moderate size layering RIGHT pleural effusion. 2. Increased consolidation in the RIGHT lung and new perihilar consolidation and ground-glass densities in the LEFT lung. Differential includes evolution of radiation change versus drug reaction. Metastatic disease is less favored. 3. No evidence of thoracic metastatic adenopathy. PELVIS: 1. No evidence of metastatic disease in the abdomen pelvis. 2. Stable benign hepatic cysts. 3. Stable benign adrenal adenomas. 4.  Aortic Atherosclerosis (ICD10-I70.0). Electronically  Signed   By: Jackquline Boxer M.D.   On: 10/05/2023 10:22

## 2023-10-19 ENCOUNTER — Other Ambulatory Visit: Payer: Self-pay

## 2023-10-19 ENCOUNTER — Inpatient Hospital Stay: Payer: PPO

## 2023-10-19 ENCOUNTER — Inpatient Hospital Stay: Payer: PPO | Attending: Hematology | Admitting: Hematology

## 2023-10-19 VITALS — BP 141/84 | HR 72 | Temp 97.2°F | Resp 20 | Wt 215.4 lb

## 2023-10-19 DIAGNOSIS — R590 Localized enlarged lymph nodes: Secondary | ICD-10-CM | POA: Diagnosis not present

## 2023-10-19 DIAGNOSIS — E279 Disorder of adrenal gland, unspecified: Secondary | ICD-10-CM | POA: Diagnosis not present

## 2023-10-19 DIAGNOSIS — C3411 Malignant neoplasm of upper lobe, right bronchus or lung: Secondary | ICD-10-CM | POA: Insufficient documentation

## 2023-10-19 DIAGNOSIS — J984 Other disorders of lung: Secondary | ICD-10-CM | POA: Diagnosis not present

## 2023-10-19 DIAGNOSIS — F17210 Nicotine dependence, cigarettes, uncomplicated: Secondary | ICD-10-CM | POA: Insufficient documentation

## 2023-10-19 DIAGNOSIS — E876 Hypokalemia: Secondary | ICD-10-CM | POA: Diagnosis not present

## 2023-10-19 DIAGNOSIS — Z95828 Presence of other vascular implants and grafts: Secondary | ICD-10-CM

## 2023-10-19 LAB — COMPREHENSIVE METABOLIC PANEL
ALT: 12 U/L (ref 0–44)
AST: 12 U/L — ABNORMAL LOW (ref 15–41)
Albumin: 3.2 g/dL — ABNORMAL LOW (ref 3.5–5.0)
Alkaline Phosphatase: 55 U/L (ref 38–126)
Anion gap: 10 (ref 5–15)
BUN: 11 mg/dL (ref 8–23)
CO2: 28 mmol/L (ref 22–32)
Calcium: 8 mg/dL — ABNORMAL LOW (ref 8.9–10.3)
Chloride: 100 mmol/L (ref 98–111)
Creatinine, Ser: 0.67 mg/dL (ref 0.61–1.24)
GFR, Estimated: >60 mL/min (ref >60)
Glucose, Bld: 93 mg/dL (ref 70–99)
Potassium: 3.3 mmol/L — ABNORMAL LOW (ref 3.5–5.1)
Sodium: 138 mmol/L (ref 135–145)
Total Bilirubin: 1.3 mg/dL — ABNORMAL HIGH (ref 0.0–1.2)
Total Protein: 6.1 g/dL — ABNORMAL LOW (ref 6.5–8.1)

## 2023-10-19 LAB — CBC WITH DIFFERENTIAL/PLATELET
Abs Immature Granulocytes: 0.09 10*3/uL — ABNORMAL HIGH (ref 0.00–0.07)
Basophils Absolute: 0 10*3/uL (ref 0.0–0.1)
Basophils Relative: 0 %
Eosinophils Absolute: 0.2 10*3/uL (ref 0.0–0.5)
Eosinophils Relative: 2 %
HCT: 48 % (ref 39.0–52.0)
Hemoglobin: 15.3 g/dL (ref 13.0–17.0)
Immature Granulocytes: 1 %
Lymphocytes Relative: 6 %
Lymphs Abs: 0.6 10*3/uL — ABNORMAL LOW (ref 0.7–4.0)
MCH: 29.8 pg (ref 26.0–34.0)
MCHC: 31.9 g/dL (ref 30.0–36.0)
MCV: 93.6 fL (ref 80.0–100.0)
Monocytes Absolute: 0.8 10*3/uL (ref 0.1–1.0)
Monocytes Relative: 8 %
Neutro Abs: 9.2 10*3/uL — ABNORMAL HIGH (ref 1.7–7.7)
Neutrophils Relative %: 83 %
Platelets: 186 10*3/uL (ref 150–400)
RBC: 5.13 MIL/uL (ref 4.22–5.81)
RDW: 18.9 % — ABNORMAL HIGH (ref 11.5–15.5)
WBC: 11 10*3/uL — ABNORMAL HIGH (ref 4.0–10.5)
nRBC: 0 % (ref 0.0–0.2)

## 2023-10-19 LAB — MAGNESIUM: Magnesium: 1.9 mg/dL (ref 1.7–2.4)

## 2023-10-19 MED ORDER — HEPARIN SOD (PORK) LOCK FLUSH 100 UNIT/ML IV SOLN
500.0000 [IU] | Freq: Once | INTRAVENOUS | Status: AC
  Administered 2023-10-19: 500 [IU] via INTRAVENOUS

## 2023-10-19 MED ORDER — LIDOCAINE-PRILOCAINE 2.5-2.5 % EX CREA: TOPICAL_CREAM | CUTANEOUS | 3 refills | Status: AC

## 2023-10-19 MED ORDER — SODIUM CHLORIDE FLUSH 0.9 % IV SOLN
10.0000 mL | Freq: Once | INTRAVENOUS | Status: AC
  Administered 2023-10-19: 10 mL via INTRAVENOUS
  Filled 2023-10-19: qty 10

## 2023-10-19 NOTE — Addendum Note (Signed)
 Addended by: Cori Razor on: 10/19/2023 10:50 AM   Modules accepted: Orders

## 2023-10-19 NOTE — Patient Instructions (Addendum)
 [facility] at [facility] Discharge Instructions   You were seen and examined today by Dr. Rogers.  He reviewed the results of your lab work which are normal/stable.   We will continue to hold the immunotherapy. We will not plan to restart it.   We will continue to monitor you with CT scans. We will repeat CT chest in one month.   Return as scheduled.    Thank you for choosing [facility] at [facility] to provide your oncology and hematology care.  To afford each patient quality time with our provider, please arrive at least 15 minutes before your scheduled appointment time.   If you have a lab appointment with the Cancer Center please come in thru the Main Entrance and check in at the main information desk.  You need to re-schedule your appointment should you arrive 10 or more minutes late.  We strive to give you quality time with our providers, and arriving late affects you and other patients whose appointments are after yours.  Also, if you no show three or more times for appointments you may be dismissed from the clinic at the providers discretion.     Again, thank you for choosing [facility].  Our hope is that these requests will decrease the amount of time that you wait before being seen by our physicians.       _____________________________________________________________  Should you have questions after your visit to [facility], please contact our office at [phone] and follow the prompts.  Our office hours are 8:00 a.m. and 4:30 p.m. Monday - Friday.  Please note that voicemails left after 4:00 p.m. may not be returned until the following business day.  We are closed weekends and major holidays.  You do have access to a nurse 24-7, just call the main number to the clinic [phone] and do not press any options, hold on the line and a nurse will answer the phone.    For prescription refill requests,  have your pharmacy contact our office and allow 72 hours.    Due to Covid, you will need to wear a mask upon entering the hospital. If you do not have a mask, a mask will be given to you at the Main Entrance upon arrival. For doctor visits, patients may have 1 support person age 40 or older with them. For treatment visits, patients can not have anyone with them due to social distancing guidelines and our immunocompromised population.

## 2023-10-19 NOTE — Progress Notes (Signed)
 Port flushed with good blood return noted. No bruising or swelling at site. Bandaid applied and patient discharged in satisfactory condition. VVS stable with no signs or symptoms of distressed noted.

## 2023-10-20 ENCOUNTER — Other Ambulatory Visit: Payer: Self-pay

## 2023-10-26 ENCOUNTER — Other Ambulatory Visit: Payer: Self-pay | Admitting: *Deleted

## 2023-10-26 MED ORDER — PREDNISONE 5 MG PO TABS: ORAL_TABLET | ORAL | 0 refills | Status: DC

## 2023-10-28 ENCOUNTER — Encounter (HOSPITAL_COMMUNITY): Payer: Self-pay | Admitting: Hematology

## 2023-10-28 ENCOUNTER — Encounter: Payer: Self-pay | Admitting: Hematology

## 2023-11-05 DIAGNOSIS — J449 Chronic obstructive pulmonary disease, unspecified: Secondary | ICD-10-CM | POA: Diagnosis not present

## 2023-11-05 DIAGNOSIS — G4733 Obstructive sleep apnea (adult) (pediatric): Secondary | ICD-10-CM | POA: Diagnosis not present

## 2023-11-05 DIAGNOSIS — I10 Essential (primary) hypertension: Secondary | ICD-10-CM | POA: Diagnosis not present

## 2023-11-14 ENCOUNTER — Inpatient Hospital Stay (HOSPITAL_COMMUNITY)
Admission: EM | Admit: 2023-11-14 | Discharge: 2023-11-17 | DRG: 291 | Disposition: A | Discharge status: Home / Self Care | Payer: PPO | Attending: Internal Medicine | Admitting: Internal Medicine

## 2023-11-14 ENCOUNTER — Encounter (HOSPITAL_COMMUNITY): Payer: Self-pay

## 2023-11-14 ENCOUNTER — Other Ambulatory Visit: Payer: Self-pay

## 2023-11-14 ENCOUNTER — Emergency Department (HOSPITAL_COMMUNITY): Payer: PPO

## 2023-11-14 DIAGNOSIS — J948 Other specified pleural conditions: Secondary | ICD-10-CM | POA: Diagnosis not present

## 2023-11-14 DIAGNOSIS — Z1152 Encounter for screening for COVID-19: Secondary | ICD-10-CM | POA: Diagnosis not present

## 2023-11-14 DIAGNOSIS — Z720 Tobacco use: Secondary | ICD-10-CM | POA: Diagnosis present

## 2023-11-14 DIAGNOSIS — I5023 Acute on chronic systolic (congestive) heart failure: Secondary | ICD-10-CM | POA: Diagnosis present

## 2023-11-14 DIAGNOSIS — F419 Anxiety disorder, unspecified: Secondary | ICD-10-CM | POA: Diagnosis present

## 2023-11-14 DIAGNOSIS — I509 Heart failure, unspecified: Secondary | ICD-10-CM | POA: Diagnosis not present

## 2023-11-14 DIAGNOSIS — R0609 Other forms of dyspnea: Secondary | ICD-10-CM | POA: Diagnosis not present

## 2023-11-14 DIAGNOSIS — J9621 Acute and chronic respiratory failure with hypoxia: Secondary | ICD-10-CM | POA: Diagnosis present

## 2023-11-14 DIAGNOSIS — R918 Other nonspecific abnormal finding of lung field: Secondary | ICD-10-CM | POA: Diagnosis not present

## 2023-11-14 DIAGNOSIS — C3490 Malignant neoplasm of unspecified part of unspecified bronchus or lung: Secondary | ICD-10-CM | POA: Diagnosis not present

## 2023-11-14 DIAGNOSIS — Z9221 Personal history of antineoplastic chemotherapy: Secondary | ICD-10-CM | POA: Diagnosis not present

## 2023-11-14 DIAGNOSIS — Z9981 Dependence on supplemental oxygen: Secondary | ICD-10-CM

## 2023-11-14 DIAGNOSIS — J811 Chronic pulmonary edema: Secondary | ICD-10-CM | POA: Diagnosis not present

## 2023-11-14 DIAGNOSIS — G4733 Obstructive sleep apnea (adult) (pediatric): Secondary | ICD-10-CM | POA: Diagnosis present

## 2023-11-14 DIAGNOSIS — J9622 Acute and chronic respiratory failure with hypercapnia: Secondary | ICD-10-CM | POA: Diagnosis not present

## 2023-11-14 DIAGNOSIS — J441 Chronic obstructive pulmonary disease with (acute) exacerbation: Secondary | ICD-10-CM

## 2023-11-14 DIAGNOSIS — T451X5A Adverse effect of antineoplastic and immunosuppressive drugs, initial encounter: Secondary | ICD-10-CM | POA: Diagnosis not present

## 2023-11-14 DIAGNOSIS — J9601 Acute respiratory failure with hypoxia: Secondary | ICD-10-CM | POA: Diagnosis not present

## 2023-11-14 DIAGNOSIS — Z923 Personal history of irradiation: Secondary | ICD-10-CM

## 2023-11-14 DIAGNOSIS — G4700 Insomnia, unspecified: Secondary | ICD-10-CM | POA: Diagnosis present

## 2023-11-14 DIAGNOSIS — C3411 Malignant neoplasm of upper lobe, right bronchus or lung: Secondary | ICD-10-CM | POA: Diagnosis not present

## 2023-11-14 DIAGNOSIS — I10 Essential (primary) hypertension: Secondary | ICD-10-CM | POA: Diagnosis not present

## 2023-11-14 DIAGNOSIS — I110 Hypertensive heart disease with heart failure: Principal | ICD-10-CM | POA: Diagnosis present

## 2023-11-14 DIAGNOSIS — I5033 Acute on chronic diastolic (congestive) heart failure: Secondary | ICD-10-CM | POA: Diagnosis not present

## 2023-11-14 DIAGNOSIS — I5032 Chronic diastolic (congestive) heart failure: Secondary | ICD-10-CM | POA: Diagnosis present

## 2023-11-14 DIAGNOSIS — F17210 Nicotine dependence, cigarettes, uncomplicated: Secondary | ICD-10-CM | POA: Diagnosis present

## 2023-11-14 DIAGNOSIS — R0602 Shortness of breath: Secondary | ICD-10-CM | POA: Diagnosis not present

## 2023-11-14 DIAGNOSIS — R7303 Prediabetes: Secondary | ICD-10-CM | POA: Diagnosis not present

## 2023-11-14 DIAGNOSIS — Z8249 Family history of ischemic heart disease and other diseases of the circulatory system: Secondary | ICD-10-CM

## 2023-11-14 DIAGNOSIS — R042 Hemoptysis: Secondary | ICD-10-CM | POA: Diagnosis present

## 2023-11-14 DIAGNOSIS — I5043 Acute on chronic combined systolic (congestive) and diastolic (congestive) heart failure: Secondary | ICD-10-CM | POA: Diagnosis present

## 2023-11-14 DIAGNOSIS — Z79899 Other long term (current) drug therapy: Secondary | ICD-10-CM

## 2023-11-14 DIAGNOSIS — Z833 Family history of diabetes mellitus: Secondary | ICD-10-CM

## 2023-11-14 DIAGNOSIS — R0902 Hypoxemia: Secondary | ICD-10-CM | POA: Diagnosis not present

## 2023-11-14 DIAGNOSIS — E8729 Other acidosis: Secondary | ICD-10-CM | POA: Diagnosis not present

## 2023-11-14 DIAGNOSIS — I480 Paroxysmal atrial fibrillation: Secondary | ICD-10-CM | POA: Diagnosis not present

## 2023-11-14 DIAGNOSIS — J9612 Chronic respiratory failure with hypercapnia: Secondary | ICD-10-CM | POA: Diagnosis not present

## 2023-11-14 DIAGNOSIS — J90 Pleural effusion, not elsewhere classified: Secondary | ICD-10-CM | POA: Diagnosis not present

## 2023-11-14 DIAGNOSIS — Z4682 Encounter for fitting and adjustment of non-vascular catheter: Secondary | ICD-10-CM | POA: Diagnosis not present

## 2023-11-14 DIAGNOSIS — J984 Other disorders of lung: Secondary | ICD-10-CM | POA: Diagnosis present

## 2023-11-14 DIAGNOSIS — C3491 Malignant neoplasm of unspecified part of right bronchus or lung: Secondary | ICD-10-CM | POA: Diagnosis not present

## 2023-11-14 DIAGNOSIS — R062 Wheezing: Secondary | ICD-10-CM | POA: Diagnosis not present

## 2023-11-14 DIAGNOSIS — I517 Cardiomegaly: Secondary | ICD-10-CM | POA: Diagnosis not present

## 2023-11-14 LAB — CBC WITH DIFFERENTIAL/PLATELET
Abs Immature Granulocytes: 0.12 10*3/uL — ABNORMAL HIGH (ref 0.00–0.07)
Basophils Absolute: 0 10*3/uL (ref 0.0–0.1)
Basophils Relative: 0 %
Eosinophils Absolute: 0.1 10*3/uL (ref 0.0–0.5)
Eosinophils Relative: 1 %
HCT: 56 % — ABNORMAL HIGH (ref 39.0–52.0)
Hemoglobin: 17.3 g/dL — ABNORMAL HIGH (ref 13.0–17.0)
Immature Granulocytes: 1 %
Lymphocytes Relative: 5 %
Lymphs Abs: 0.5 10*3/uL — ABNORMAL LOW (ref 0.7–4.0)
MCH: 29.9 pg (ref 26.0–34.0)
MCHC: 30.9 g/dL (ref 30.0–36.0)
MCV: 96.9 fL (ref 80.0–100.0)
Monocytes Absolute: 0.6 10*3/uL (ref 0.1–1.0)
Monocytes Relative: 6 %
Neutro Abs: 10 10*3/uL — ABNORMAL HIGH (ref 1.7–7.7)
Neutrophils Relative %: 87 %
Platelets: 248 10*3/uL (ref 150–400)
RBC: 5.78 MIL/uL (ref 4.22–5.81)
RDW: 19.7 % — ABNORMAL HIGH (ref 11.5–15.5)
WBC: 11.4 10*3/uL — ABNORMAL HIGH (ref 4.0–10.5)
nRBC: 0 % (ref 0.0–0.2)

## 2023-11-14 LAB — RESP PANEL BY RT-PCR (RSV, FLU A&B, COVID)  RVPGX2
Influenza A by PCR: NEGATIVE
Influenza B by PCR: NEGATIVE
Resp Syncytial Virus by PCR: NEGATIVE
SARS Coronavirus 2 by RT PCR: NEGATIVE

## 2023-11-14 LAB — LACTIC ACID, PLASMA
Lactic Acid, Venous: 1.2 mmol/L (ref 0.5–1.9)
Lactic Acid, Venous: 0.8 mmol/L (ref 0.5–1.9)

## 2023-11-14 LAB — BLOOD GAS, VENOUS
Acid-Base Excess: 11.2 mmol/L — ABNORMAL HIGH (ref 0.0–2.0)
Bicarbonate: 41.3 mmol/L — ABNORMAL HIGH (ref 20.0–28.0)
Drawn by: 27160
O2 Saturation: 54.8 %
Patient temperature: 36.4
pCO2, Ven: 82 mm[Hg] (ref 44–60)
pH, Ven: 7.31 (ref 7.25–7.43)
pO2, Ven: 32 mm[Hg] (ref 32–45)

## 2023-11-14 LAB — TROPONIN I (HIGH SENSITIVITY)
Troponin I (High Sensitivity): 12 ng/L (ref <18)
Troponin I (High Sensitivity): 15 ng/L (ref <18)

## 2023-11-14 LAB — BASIC METABOLIC PANEL
Anion gap: 12 (ref 5–15)
BUN: 12 mg/dL (ref 8–23)
CO2: 31 mmol/L (ref 22–32)
Calcium: 8.5 mg/dL — ABNORMAL LOW (ref 8.9–10.3)
Chloride: 98 mmol/L (ref 98–111)
Creatinine, Ser: 0.77 mg/dL (ref 0.61–1.24)
GFR, Estimated: >60 mL/min (ref >60)
Glucose, Bld: 112 mg/dL — ABNORMAL HIGH (ref 70–99)
Potassium: 3.5 mmol/L (ref 3.5–5.1)
Sodium: 141 mmol/L (ref 135–145)

## 2023-11-14 LAB — MAGNESIUM: Magnesium: 1.9 mg/dL (ref 1.7–2.4)

## 2023-11-14 LAB — PROCALCITONIN: Procalcitonin: <0.1 ng/mL

## 2023-11-14 LAB — BRAIN NATRIURETIC PEPTIDE: B Natriuretic Peptide: 444 pg/mL — ABNORMAL HIGH (ref 0.0–100.0)

## 2023-11-14 MED ORDER — ALPRAZOLAM 0.5 MG PO TABS
2.0000 mg | ORAL_TABLET | Freq: Every evening | ORAL | Status: DC | PRN
  Administered 2023-11-14 – 2023-11-16 (×3): 2 mg via ORAL
  Filled 2023-11-14 (×3): qty 4

## 2023-11-14 MED ORDER — METOLAZONE 5 MG PO TABS
2.5000 mg | ORAL_TABLET | Freq: Every day | ORAL | Status: DC
  Administered 2023-11-16 – 2023-11-17 (×2): 2.5 mg via ORAL
  Filled 2023-11-14 (×3): qty 1

## 2023-11-14 MED ORDER — FUROSEMIDE 10 MG/ML IJ SOLN
40.0000 mg | Freq: Two times a day (BID) | INTRAMUSCULAR | Status: DC
  Administered 2023-11-14 – 2023-11-17 (×6): 40 mg via INTRAVENOUS
  Filled 2023-11-14 (×6): qty 4

## 2023-11-14 MED ORDER — ACETAMINOPHEN 325 MG PO TABS: 650.0000 mg | ORAL_TABLET | Freq: Four times a day (QID) | ORAL | Status: DC | PRN

## 2023-11-14 MED ORDER — SODIUM CHLORIDE 0.9% FLUSH
3.0000 mL | Freq: Two times a day (BID) | INTRAVENOUS | Status: DC
  Administered 2023-11-15 – 2023-11-17 (×3): 3 mL via INTRAVENOUS

## 2023-11-14 MED ORDER — SODIUM CHLORIDE 0.9 % IV SOLN: INTRAVENOUS | Status: AC | PRN

## 2023-11-14 MED ORDER — POLYETHYLENE GLYCOL 3350 17 G PO PACK: 17.0000 g | PACK | Freq: Every day | ORAL | Status: DC | PRN

## 2023-11-14 MED ORDER — SODIUM CHLORIDE 0.9% FLUSH
3.0000 mL | Freq: Two times a day (BID) | INTRAVENOUS | Status: DC
  Administered 2023-11-14 – 2023-11-17 (×6): 3 mL via INTRAVENOUS

## 2023-11-14 MED ORDER — METHYLPREDNISOLONE SODIUM SUCC 40 MG IJ SOLR
40.0000 mg | Freq: Two times a day (BID) | INTRAMUSCULAR | Status: DC
  Administered 2023-11-14 – 2023-11-17 (×5): 40 mg via INTRAVENOUS
  Filled 2023-11-14 (×6): qty 1

## 2023-11-14 MED ORDER — ALLOPURINOL 300 MG PO TABS
600.0000 mg | ORAL_TABLET | Freq: Every day | ORAL | Status: DC
  Administered 2023-11-15 – 2023-11-17 (×3): 600 mg via ORAL
  Filled 2023-11-14 (×3): qty 2

## 2023-11-14 MED ORDER — BISACODYL 10 MG RE SUPP: 10.0000 mg | Freq: Every day | RECTAL | Status: DC | PRN

## 2023-11-14 MED ORDER — SODIUM CHLORIDE 0.9% FLUSH: 3.0000 mL | INTRAVENOUS | Status: DC | PRN

## 2023-11-14 MED ORDER — ONDANSETRON HCL 4 MG PO TABS: 4.0000 mg | ORAL_TABLET | Freq: Four times a day (QID) | ORAL | Status: DC | PRN

## 2023-11-14 MED ORDER — CHLORHEXIDINE GLUCONATE CLOTH 2 % EX PADS
6.0000 | MEDICATED_PAD | Freq: Every day | CUTANEOUS | Status: DC
  Administered 2023-11-14 – 2023-11-17 (×4): 6 via TOPICAL

## 2023-11-14 MED ORDER — HEPARIN SODIUM (PORCINE) 5000 UNIT/ML IJ SOLN
5000.0000 [IU] | Freq: Three times a day (TID) | INTRAMUSCULAR | Status: DC
  Administered 2023-11-14 – 2023-11-17 (×8): 5000 [IU] via SUBCUTANEOUS
  Filled 2023-11-14 (×8): qty 1

## 2023-11-14 MED ORDER — POTASSIUM CHLORIDE CRYS ER 20 MEQ PO TBCR
40.0000 meq | EXTENDED_RELEASE_TABLET | Freq: Once | ORAL | Status: AC
  Administered 2023-11-14: 40 meq via ORAL
  Filled 2023-11-14: qty 2

## 2023-11-14 MED ORDER — ONDANSETRON HCL 4 MG/2ML IJ SOLN
4.0000 mg | Freq: Four times a day (QID) | INTRAMUSCULAR | Status: DC | PRN
Start: 2023-11-14 — End: 2023-11-17

## 2023-11-14 MED ORDER — ACETAMINOPHEN 650 MG RE SUPP: 650.0000 mg | Freq: Four times a day (QID) | RECTAL | Status: DC | PRN

## 2023-11-14 MED ORDER — FUROSEMIDE 10 MG/ML IJ SOLN
80.0000 mg | Freq: Once | INTRAMUSCULAR | Status: AC
  Administered 2023-11-14: 80 mg via INTRAVENOUS
  Filled 2023-11-14: qty 8

## 2023-11-14 MED ORDER — TRAZODONE HCL 50 MG PO TABS: 50.0000 mg | ORAL_TABLET | Freq: Every evening | ORAL | Status: DC | PRN

## 2023-11-14 MED ORDER — SOTALOL HCL 80 MG PO TABS
80.0000 mg | ORAL_TABLET | Freq: Two times a day (BID) | ORAL | Status: DC
  Administered 2023-11-14 – 2023-11-16 (×5): 80 mg via ORAL
  Filled 2023-11-14 (×11): qty 1

## 2023-11-14 MED ORDER — DOXYCYCLINE HYCLATE 100 MG PO TABS
100.0000 mg | ORAL_TABLET | Freq: Two times a day (BID) | ORAL | Status: DC
  Administered 2023-11-14 – 2023-11-17 (×6): 100 mg via ORAL
  Filled 2023-11-14 (×6): qty 1

## 2023-11-14 MED ORDER — SOTALOL HCL 80 MG PO TABS
80.0000 mg | ORAL_TABLET | Freq: Every day | ORAL | Status: DC
  Filled 2023-11-14: qty 1

## 2023-11-14 MED ORDER — NICOTINE 21 MG/24HR TD PT24
21.0000 mg | MEDICATED_PATCH | Freq: Every day | TRANSDERMAL | Status: DC
  Administered 2023-11-14 – 2023-11-17 (×4): 21 mg via TRANSDERMAL
  Filled 2023-11-14 (×4): qty 1

## 2023-11-14 NOTE — Progress Notes (Signed)
 Transported patient on BIPAP from ED to ICU.

## 2023-11-14 NOTE — Progress Notes (Signed)
 Pt BIB by EMs on NRB. Placed on BIPAP due to increased WOB. MD at bedside.

## 2023-11-14 NOTE — ED Provider Notes (Signed)
 [place] EMERGENCY DEPARTMENT AT [facility] Provider Note   CSN: [id number] Arrival date & time: 11/14/23  9044     History  Chief Complaint  Patient presents with   Shortness of Breath         Larry Moore is a 73 y.o. male.  He has a history of paroxysmal A-fib, heart failure, lung cancer status postradiation and chemotherapy.  He tells me his oncologist started him on oxygen  along with a course of steroids and antibiotics a few weeks ago for increased shortness of breath.  He felt improved for a few days but for the last week he has been more short of breath again.  Minimally productive cough.  Has been using his oxygen  24/7 and had to increase it to 4 L.  He was on Eliquis but they stopped that due to some hemoptysis.  He denies any current hemoptysis.  He denies any abdominal pain nausea vomiting diarrhea or urinary symptoms.  The history is provided by the patient.  Shortness of Breath Severity:  Moderate Onset quality:  Gradual Duration:  1 week Timing:  Constant Progression:  Worsening Chronicity:  Recurrent Relieved by:  Nothing Worsened by:  Activity Ineffective treatments:  Oxygen  and rest Associated symptoms: cough and sputum production   Associated symptoms: no abdominal pain, no chest pain, no fever, no hemoptysis and no vomiting   Risk factors: tobacco use        Home Medications Prior to Admission medications   Medication Sig Start Date End Date Taking? Authorizing Provider  allopurinol  (ZYLOPRIM ) 300 MG tablet Take 600 mg by mouth daily. 06/29/22   [provider]  alprazolam  (XANAX ) 2 MG tablet Take 1 tablet (2 mg total) by mouth at bedtime as needed for anxiety. 07/13/23   Rogers Hai, MD  Atezolizumab  (TECENTRIQ  IV) Inject into the vein every 21 ( twenty-one) days. 02/23/22   [provider]  furosemide  (LASIX ) 80 MG tablet Take 80 mg by mouth daily.    [provider]  lidocaine -prilocaine  (EMLA ) cream  Apply topically as directed. 10/19/23   Rogers Hai, MD  metolazone  (ZAROXOLYN ) 2.5 MG tablet Take 2.5 mg by mouth daily.    [provider]  potassium chloride  (KLOR-CON  M) 10 MEQ tablet Take 1 tablet (10 mEq total) by mouth 2 (two) times daily. 06/30/23   Rogers Hai, MD  predniSONE  (DELTASONE ) 20 MG tablet Take 1 tablet (20 mg total) by mouth daily with breakfast. 10/05/23   Rogers Hai, MD  predniSONE  (DELTASONE ) 5 MG tablet Take 5 mg x 1 week alternating with 10 mg the following week alternating weekly 10/26/23   Katragadda, Sreedhar, MD  sotalol  (BETAPACE ) 80 MG tablet SMARTSIG:1 Tablet(s) By Mouth Every 12 Hours 02/27/22   [provider]  Vitamin D, Ergocalciferol, (DRISDOL) 1.25 MG (50000 UNIT) CAPS capsule Take 50,000 Units by mouth daily. 01/22/22   [provider]  zolpidem  (AMBIEN ) 10 MG tablet Take 10 mg by mouth at bedtime as needed. 05/06/23   [provider]  prochlorperazine  (COMPAZINE ) 10 MG tablet Take 1 tablet (10 mg total) by mouth every 6 (six) hours as needed (Nausea or vomiting). 02/23/22 06/07/22  Rogers Hai, MD      Allergies    Patient has no known allergies.    Review of Systems   Review of Systems  Constitutional:  Negative for fever.  Respiratory:  Positive for cough, sputum production and shortness of breath. Negative for hemoptysis.   Cardiovascular:  Negative  for chest pain.  Gastrointestinal:  Negative for abdominal pain and vomiting.    Physical Exam Updated Vital Signs BP (!) 130/95   Pulse 87   Temp (!) 97.5 F (36.4 C) (Oral)   Resp (!) 25   SpO2 93%  Physical Exam Vitals and nursing note reviewed.  Constitutional:      General: He is not in acute distress.    Appearance: He is well-developed. He is ill-appearing.  HENT:     Head: Normocephalic and atraumatic.  Eyes:     Conjunctiva/sclera: Conjunctivae normal.  Cardiovascular:     Rate and Rhythm: Normal rate and regular  rhythm.     Heart sounds: No murmur heard. Pulmonary:     Effort: Tachypnea and accessory muscle usage present. No respiratory distress.     Breath sounds: Examination of the right-upper field reveals decreased breath sounds. Decreased breath sounds present.  Abdominal:     Palpations: Abdomen is soft.     Tenderness: There is no abdominal tenderness. There is no guarding or rebound.  Musculoskeletal:        General: No swelling.     Cervical back: Neck supple.     Right lower leg: No tenderness. Edema present.     Left lower leg: No tenderness. Edema present.  Skin:    General: Skin is warm and dry.     Capillary Refill: Capillary refill takes less than 2 seconds.  Neurological:     General: No focal deficit present.     Mental Status: He is alert.     ED Results / Procedures / Treatments   Labs (all labs ordered are listed, but only abnormal results are displayed) Labs Reviewed  BRAIN NATRIURETIC PEPTIDE - Abnormal; Notable for the following components:      Result Value   B Natriuretic Peptide 444.0 (*)    All other components within normal limits  BASIC METABOLIC PANEL - Abnormal; Notable for the following components:   Glucose, Bld 112 (*)    Calcium 8.5 (*)    All other components within normal limits  CBC WITH DIFFERENTIAL/PLATELET - Abnormal; Notable for the following components:   WBC 11.4 (*)    Hemoglobin 17.3 (*)    HCT 56.0 (*)    RDW 19.7 (*)    Neutro Abs 10.0 (*)    Lymphs Abs 0.5 (*)    Abs Immature Granulocytes 0.12 (*)    All other components within normal limits  BLOOD GAS, VENOUS - Abnormal; Notable for the following components:   pCO2, Ven 82 (*)    Bicarbonate 41.3 (*)    Acid-Base Excess 11.2 (*)    All other components within normal limits  RESP PANEL BY RT-PCR (RSV, FLU A&B, COVID)  RVPGX2  CULTURE, BLOOD (ROUTINE X 2)  CULTURE, BLOOD (ROUTINE X 2)  MRSA NEXT GEN BY PCR, NASAL  LACTIC ACID, PLASMA  LACTIC ACID, PLASMA  MAGNESIUM    PROCALCITONIN  BASIC METABOLIC PANEL  CBC  TROPONIN I (HIGH SENSITIVITY)  TROPONIN I (HIGH SENSITIVITY)    EKG EKG Interpretation Date/Time:  Sunday November 14 2023 10:06:54 EST Ventricular Rate:  65 PR Interval:  142 QRS Duration:  100 QT Interval:  441 QTC Calculation: 459 R Axis:   100  Text Interpretation: Sinus rhythm Supraventricular bigeminy Anteroseptal infarct, age indeterminate No significant change since prior 4/23 Confirmed by Towana Sharper [phone]) on 11/14/2023 10:12:41 AM  Radiology DG Chest Port 1 View Result Date: 11/14/2023 CLINICAL DATA:  Worsening shortness  of breath. History of lung cancer. EXAM: PORTABLE CHEST 1 VIEW COMPARISON:  CT chest dated September 27, 2023. Chest x-ray dated January 19, 2022. FINDINGS: Patient is rotated to the right. Left chest wall port catheter. Unchanged cardiomegaly. Diffuse interstitial and hazy airspace opacities throughout both lungs moderate right and small left pleural effusions. No pneumothorax. No acute osseous abnormality. IMPRESSION: 1. Moderate congestive heart failure. Electronically Signed   By: Elsie ONEIDA Shoulder M.D.   On: 11/14/2023 11:31    Procedures .Critical Care  Performed by: Towana Ozell BROCKS, MD Authorized by: Towana Ozell BROCKS, MD   Critical care provider statement:    Critical care time (minutes):  45   Critical care time was exclusive of:  Separately billable procedures and treating other patients   Critical care was necessary to treat or prevent imminent or life-threatening deterioration of the following conditions:  Respiratory failure   Critical care was time spent personally by me on the following activities:  Development of treatment plan with patient or surrogate, discussions with consultants, evaluation of patient's response to treatment, examination of patient, obtaining history from patient or surrogate, ordering and performing treatments and interventions, ordering and review of laboratory studies,  ordering and review of radiographic studies, pulse oximetry, re-evaluation of patient's condition and review of old charts   I assumed direction of critical care for this patient from another provider in my specialty: no       Medications Ordered in ED Medications  Chlorhexidine  Gluconate Cloth 2 % PADS 6 each (6 each Topical Given 11/14/23 1333)  allopurinol  (ZYLOPRIM ) tablet 600 mg (600 mg Oral Not Given 11/14/23 1519)  furosemide  (LASIX ) injection 40 mg (40 mg Intravenous Given 11/14/23 1614)  metolazone  (ZAROXOLYN ) tablet 2.5 mg (2.5 mg Oral Not Given 11/14/23 1518)  ALPRAZolam  (XANAX ) tablet 2 mg (has no administration in time range)  methylPREDNISolone  sodium succinate (SOLU-MEDROL ) 40 mg/mL injection 40 mg (40 mg Intravenous Given 11/14/23 1517)  sodium chloride  flush (NS) 0.9 % injection 3 mL (has no administration in time range)  sodium chloride  flush (NS) 0.9 % injection 3 mL (has no administration in time range)  sodium chloride  flush (NS) 0.9 % injection 3 mL (has no administration in time range)  0.9 %  sodium chloride  infusion (has no administration in time range)  acetaminophen  (TYLENOL ) tablet 650 mg (has no administration in time range)    Or  acetaminophen  (TYLENOL ) suppository 650 mg (has no administration in time range)  traZODone  (DESYREL ) tablet 50 mg (has no administration in time range)  polyethylene glycol (MIRALAX  / GLYCOLAX ) packet 17 g (has no administration in time range)  bisacodyl  (DULCOLAX) suppository 10 mg (has no administration in time range)  ondansetron  (ZOFRAN ) tablet 4 mg (has no administration in time range)    Or  ondansetron  (ZOFRAN ) injection 4 mg (has no administration in time range)  heparin  injection 5,000 Units (has no administration in time range)  doxycycline  (VIBRA -TABS) tablet 100 mg (100 mg Oral Not Given 11/14/23 1518)  nicotine  (NICODERM CQ  - dosed in mg/24 hours) patch 21 mg (21 mg Transdermal Patch Applied 11/14/23 1614)  sotalol  (BETAPACE )  tablet 80 mg (has no administration in time range)  furosemide  (LASIX ) injection 80 mg (80 mg Intravenous Given 11/14/23 1151)    ED Course/ Medical Decision Making/ A&P Clinical Course as of 11/14/23 1741  Sun Nov 14, 2023  1103 Patient continues to struggle with his breathing and does not feel better with increasing oxygen .  I asked respiratory to come  down to see if he may benefit from BiPAP. [MB]  1112 Patient placed on BiPAP and he says he feels a little bit better.  Will continue to monitor. [MB]    Clinical Course User Index [MB] Towana Ozell BROCKS, MD                                 Medical Decision Making Amount and/or Complexity of Data Reviewed Labs: ordered. Radiology: ordered.  Risk Prescription drug management. Decision regarding hospitalization.   This patient complains of increased shortness of breath fatigue; this involves an extensive number of treatment Options and is a complaint that carries with it a high risk of complications and morbidity. The differential includes COPD, CHF, pneumonia, COVID, flu, PE, pneumothorax  I ordered, reviewed and interpreted labs, which included CBC with mildly elevated white count, chemistries unremarkable, VBG with elevation of pCO2 although normal pH, BNP elevated, troponins flat, blood cultures sent, lactate normal I ordered medication IV Lasix  and reviewed PMP when indicated. I ordered imaging studies which included chest x-ray and I independently    visualized and interpreted imaging which showed congestive heart failure Additional history obtained from patient's family members Previous records obtained and reviewed in epic including recent oncology notes I consulted [facility] hospitalist Dr. Pearlean and discussed lab and imaging findings and discussed disposition.  Cardiac monitoring reviewed, sinus rhythm Social determinants considered, tobacco use Critical Interventions: Initiation of BiPAP for patient's increased work of  breathing  After the interventions stated above, I reevaluated the patient and found patient to be resting a little more comfortably although still with increased work of breathing Admission and further testing considered, he would benefit from mission to the hospital for further management diuresis.  Patient in agreement with plan with admission         Final Clinical Impression(s) / ED Diagnoses Final diagnoses:  Acute hypoxic on chronic hypercapnic respiratory failure (HCC)  COPD exacerbation (HCC)  Acute on chronic congestive heart failure, unspecified heart failure type (HCC)  Malignant neoplasm of lung, unspecified laterality, unspecified part of lung [facility])    Rx / DC Orders ED Discharge Orders     None         Towana Ozell BROCKS, MD 11/14/23 1744

## 2023-11-14 NOTE — Plan of Care (Signed)

## 2023-11-14 NOTE — H&P (Signed)
 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 No fever  Or chills   No Nausea, Vomiting or Diarrhea  -- Additional history obtained from patient's wife and patient's daughter Rosina at bedside--- apparently dyspnea on exertion, worsening hypoxia and orthopnea have persisted for a while and worsening over the last couple days -In the ED on 4 L of oxygen  O2 sats was 89 to 90% -Patient was noted to have increased work of breathing and was placed on BiPAP (uses CPAP at home for OSA) - In the ED chest x-ray suggestive of moderate CHF, BNP 444 no recent baseline --- COVID, influenza and RSV negative WBC 11.4, lactic acid is 1.2 repeat 0.8 procalcitonin pending -VBG consistent with compensated respiratory acidosis -Troponin initially 12 repeat troponin 15 EKG sinus rhythm without acute ACS type findings -Creatinine 0.77, potassium 3.5 sodium 141 anion gap 12 bicarb 31   Review of Systems: As mentioned in the history of present illness. All other systems reviewed and are negative. Past Medical History:  Diagnosis Date   Dyspnea    Dysrhythmia    hx a-fib   Hypertension    Insomnia    Pre-diabetes    Sleep apnea    Past Surgical History:  Procedure Laterality Date   ATRIAL FIBRILLATION ABLATION      approx 2020   CARDIAC CATHETERIZATION     HERNIA REPAIR     IR IMAGING GUIDED PORT INSERTION  02/17/2022   IR VENO/JUGULAR RIGHT  02/17/2022   VIDEO BRONCHOSCOPY WITH ENDOBRONCHIAL ULTRASOUND Bilateral 01/29/2022   Procedure: VIDEO BRONCHOSCOPY WITH ENDOBRONCHIAL ULTRASOUND;  Surgeon: Brenna Adine CROME, DO;  Location: [facility] OR;  Service: Cardiopulmonary;  Laterality: Bilateral;  w/ Guardant [id number]   Social History:  reports that he has been smoking cigarettes. He has been exposed to tobacco smoke. He does not have any smokeless tobacco history on file. He reports that he does not currently use alcohol. He reports that he does not use drugs.  No Known Allergies  Family History  Problem Relation Age of Onset   Coronary artery disease Father    Diabetes Father     Prior to Admission medications   Medication Sig Start Date End Date Taking? Authorizing Provider  allopurinol  (ZYLOPRIM ) 300 MG tablet Take 600 mg by mouth daily. 06/29/22  Yes [provider]  alprazolam  (XANAX ) 2 MG tablet Take 1 tablet (2 mg total) by mouth at bedtime as needed for anxiety. 07/13/23  Yes Rogers Hai, MD  furosemide  (LASIX ) 80 MG tablet Take 80 mg by mouth daily.   Yes [provider]  lidocaine -prilocaine  (EMLA ) cream Apply topically as directed. 10/19/23  Yes Rogers Hai, MD  metolazone  (ZAROXOLYN ) 2.5 MG tablet Take 2.5 mg by mouth daily.   Yes [provider]  Multiple Vitamin (MULTIVITAMIN) tablet Take 1 tablet by mouth daily.   Yes [provider]  potassium chloride  (KLOR-CON  M) 10 MEQ tablet Take 1 tablet (10 mEq total) by mouth 2 (two) times daily. 06/30/23  Yes Rogers Hai, MD  predniSONE  (DELTASONE ) 20 MG tablet Take 1 tablet (20 mg total) by mouth daily with breakfast. 10/05/23  Yes Rogers Hai, MD  sotalol  (BETAPACE ) 80 MG tablet Take 80 mg by mouth 2 (two) times daily. 02/27/22  Yes [provider]  zolpidem  (AMBIEN ) 10 MG tablet  Take 10 mg by mouth at bedtime as needed for sleep. 05/06/23  Yes [provider]  levofloxacin  (LEVAQUIN ) 500 MG tablet Take 500 mg by mouth daily.    [provider]  prochlorperazine  (COMPAZINE ) 10 MG tablet Take 1 tablet (10 mg total) by mouth every 6 (six) hours as needed (Nausea or vomiting). 02/23/22 06/07/22  Rogers Hai, MD    Physical Exam: Vitals:   11/14/23 1300 11/14/23 1324 11/14/23 1400 11/14/23 1500  BP: 134/72  129/75 (!) 141/88  Pulse: 67 94    Resp: (!) 28 (!) 25 16 (!) 23  Temp:  97.8 F (36.6 C)    TempSrc:  Axillary    SpO2: (!) 88% 97% 94%   Weight:      Height:        Physical Exam  Gen:- Sleepy, but arousable,, conversational dyspnea and increased work of breathing noted HEENT:- [place].AT, No sclera icterus Nose- [place] 4L/min/alternating with BiPAP Neck-Supple Neck, +ve JVD,.  Lungs-diminished breath sounds, faint bibasilar Rales  CV- S1, S2 normal, RRR Abd-  +ve B.Sounds, Abd Soft, No tenderness,    Extremity/Skin:- No significant edema, mild varicosities, good pedal pulses  Psych-patient is sleepy but arousable,  neuro-generalized weakness and lethargy, no new focal deficits, no tremors  Data Reviewed: - In the ED chest x-ray suggestive of moderate CHF, BNP 444 no recent baseline --- COVID, influenza and RSV negative WBC 11.4, lactic acid is 1.2 repeat 0.8 procalcitonin pending -VBG consistent with compensated respiratory acidosis -Troponin initially 12 repeat troponin 15 EKG sinus rhythm without acute ACS type findings -Creatinine 0.77, potassium 3.5 sodium 141 anion gap 12 bicarb 31  Assessment and Plan: 1)Acute onm chronic Hypoxic and Hypercapnic Respiratory Failure--- suspect systolic CHF related -COPD and underlying lung cancer probably contributory as well - patient presenting with orthopnea, chest x-ray suggestive of CHF, BNP elevated -IV Lasix  as ordered -Check echocardiogram -Monitor input and output closely, daily  weights -Patient was initially placed on home O2 since December 2024  2)HFrEF--acute on chronic systolic and diastolic HFrEF (EF was 45% in January 2021) -- Most likely responsible for #1 above Troponin initially 12 repeat troponin 15 EKG sinus rhythm without acute ACS type findings -BNP and CHF findings above 1 -IV Lasix  as above 1 -Repeat echo requested as above #1  3) acute COPD exacerbation--patient with ongoing tobacco use WBC 11.4, lactic acid is 1.2 repeat 0.8 procalcitonin pending -VBG consistent with compensated respiratory acidosis -IV Solu-Medrol  and doxycycline  as ordered -Bronchodilators and mucolytics --Continue patch requested  4)Extensive stage small cell Rt Lung cancer:  - s/p radiation chemoimmunotherapy - --patient sees Dr. 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 Dasie on 01/29/2020. 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 Inpatient status is judged to be reasonable and necessary in order to provide the required intensity of service to ensure the patient's safety. The patient's presenting symptoms, physical exam findings, and initial radiographic and laboratory data in the context of their chronic comorbidities is felt to place them  at high risk for further clinical deterioration. Furthermore, it is not anticipated that the patient will be medically stable for discharge from the hospital within 2 midnights of admission.   * I certify that at the point of admission it is my clinical judgment that the patient will require inpatient hospital care spanning beyond 2 midnights from the point of admission due to high intensity of service, high risk for further deterioration and high frequency of surveillance required.*  Author: Rendall Carwin, MD 11/14/2023 3:34 PM  For on call review [email or web address].

## 2023-11-14 NOTE — ED Triage Notes (Signed)
 Pt BIB ems for increased SOB. Pt is a lung cancer pt and is chronically on 4L of oxygen . Pt oxygen  saturation ranges 89-93% on 4L. Per EMS wheezing heard to lower lobes and due to a previous procedure has different upper lung sounds.

## 2023-11-15 ENCOUNTER — Inpatient Hospital Stay (HOSPITAL_COMMUNITY): Payer: PPO

## 2023-11-15 DIAGNOSIS — I5033 Acute on chronic diastolic (congestive) heart failure: Secondary | ICD-10-CM

## 2023-11-15 DIAGNOSIS — R0609 Other forms of dyspnea: Secondary | ICD-10-CM | POA: Diagnosis not present

## 2023-11-15 DIAGNOSIS — G4733 Obstructive sleep apnea (adult) (pediatric): Secondary | ICD-10-CM

## 2023-11-15 DIAGNOSIS — C3411 Malignant neoplasm of upper lobe, right bronchus or lung: Secondary | ICD-10-CM | POA: Diagnosis not present

## 2023-11-15 DIAGNOSIS — I480 Paroxysmal atrial fibrillation: Secondary | ICD-10-CM | POA: Diagnosis not present

## 2023-11-15 LAB — CBC
HCT: 50.7 % (ref 39.0–52.0)
Hemoglobin: 15.8 g/dL (ref 13.0–17.0)
MCH: 29.8 pg (ref 26.0–34.0)
MCHC: 31.2 g/dL (ref 30.0–36.0)
MCV: 95.7 fL (ref 80.0–100.0)
Platelets: 232 10*3/uL (ref 150–400)
RBC: 5.3 MIL/uL (ref 4.22–5.81)
RDW: 18.9 % — ABNORMAL HIGH (ref 11.5–15.5)
WBC: 8 10*3/uL (ref 4.0–10.5)
nRBC: 0 % (ref 0.0–0.2)

## 2023-11-15 LAB — ECHOCARDIOGRAM COMPLETE
Area-P 1/2: 3.27 cm2
Height: 68 in
S' Lateral: 3.5 cm
Weight: 3509.72 [oz_av]

## 2023-11-15 LAB — BASIC METABOLIC PANEL
Anion gap: 9 (ref 5–15)
BUN: 14 mg/dL (ref 8–23)
CO2: 39 mmol/L — ABNORMAL HIGH (ref 22–32)
Calcium: 7.9 mg/dL — ABNORMAL LOW (ref 8.9–10.3)
Chloride: 93 mmol/L — ABNORMAL LOW (ref 98–111)
Creatinine, Ser: 0.78 mg/dL (ref 0.61–1.24)
GFR, Estimated: >60 mL/min (ref >60)
Glucose, Bld: 134 mg/dL — ABNORMAL HIGH (ref 70–99)
Potassium: 3.3 mmol/L — ABNORMAL LOW (ref 3.5–5.1)
Sodium: 141 mmol/L (ref 135–145)

## 2023-11-15 LAB — MRSA NEXT GEN BY PCR, NASAL: MRSA by PCR Next Gen: NOT DETECTED

## 2023-11-15 MED ORDER — PERFLUTREN LIPID MICROSPHERE
1.0000 mL | INTRAVENOUS | Status: AC | PRN
  Administered 2023-11-15: 7 mL via INTRAVENOUS

## 2023-11-15 MED ORDER — POTASSIUM CHLORIDE CRYS ER 20 MEQ PO TBCR
40.0000 meq | EXTENDED_RELEASE_TABLET | Freq: Two times a day (BID) | ORAL | Status: DC
  Administered 2023-11-15 – 2023-11-17 (×5): 40 meq via ORAL
  Filled 2023-11-15 (×5): qty 2

## 2023-11-15 NOTE — Progress Notes (Signed)
*  PRELIMINARY RESULTS* Echocardiogram 2D Echocardiogram has been performed with Definity  given by his nurse.  Bernis Brisker 11/15/2023, 1:00 PM

## 2023-11-15 NOTE — TOC CM/SW Note (Signed)
 Transition of Care [facility]) - Inpatient Brief Assessment   Patient Details  Name: Larry Moore MRN: [id number] Date of Birth: [birth year]  Transition of Care [facility]) CM/SW Contact:    Grandville Lax, LCSWA Phone Number: 11/15/2023, 10:39 AM   Clinical Narrative: Transition of Care Department [facility]) has reviewed patient and no TOC needs have been identified at this time. We will continue to monitor patient advancement through interdiciplinary progression rounds. If new patient transition needs arise, please place a TOC consult.   Transition of Care Asessment: Insurance and Status: Insurance coverage has been reviewed Patient has primary care physician: Yes Home environment has been reviewed: From home Prior level of function:: Independent Prior/Current Home Services: No current home services Social Drivers of Health Review: SDOH reviewed no interventions necessary Readmission risk has been reviewed: Yes Transition of care needs: no transition of care needs at this time

## 2023-11-15 NOTE — Progress Notes (Signed)
 PROGRESS NOTE     Larry Moore, is a 73 y.o. 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 chemoimmunotherapy who is chronically on home O2 at 2 L per nasal cannula admitted on 11/13/2020 with acute on chronic hypercapnic and hypoxic respiratory failure -Patient was initially placed on home O2 since 10/05/2023   -Assessment and Plan: 1)Acute on chronic Hypoxic and Hypercapnic Respiratory Failure--- suspect systolic CHF related -COPD and underlying lung cancer probably contributory as well - patient presented with orthopnea, chest x-ray suggestive of CHF, BNP elevated -Echocardiogram from 11/15/2023 with EF of 55 to 60%, no mitral stenosis, no LVH, severely dilated left atrium and mildly dilated right atrium noted, diastolic parameters could not be evaluated -Continue IV Lasix  -Daily weight and fluid input and output monitoring -Monitor input and output closely, daily weights -Currently requiring 5 to 6 L of oxygen  via nasal cannula (baseline 2L/min)    2)HFrEF--acute on chronic systolic and diastolic HFrEF (EF was 45% in January 2021) -- Most likely responsible for #1 above Troponin initially 12 repeat troponin 15 EKG sinus rhythm without acute ACS type findings -BNP and CHF findings above 1 -IV Lasix  as above in # 1 -Repeat echo from 11/15/23 as above #1   3) acute COPD exacerbation--patient with ongoing tobacco use WBC 11.4, lactic acid is 1.2 repeat 0.8 procalcitonin  <0.10 -VBG consistent with compensated respiratory acidosis -Continue doxycycline  -Bronchodilators and mucolytics -Patient refusing steroids -Continue nicotine  patch   4)Extensive stage small cell Rt Lung cancer:  - s/p radiation chemoimmunotherapy - --patient sees Dr. Cheree Cords   5)pAF and AFL s/p PVI/CTI RFA (01/29/2020)--- Underwent successful PVI/CTI RFA with Dr. Phill Brazil on 01/29/2020. 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 Barriers: Not Clinically Stable-   Code Status :  -  Code Status: Full Code   Family Communication:    (patient is alert, awake and coherent)  Discussed with wife and daughter at bedside  DVT Prophylaxis  :   - SCDs  heparin  injection 5,000 Units Start:  11/14/23 2200 SCDs Start: 11/14/23 1436 Place TED hose Start: 11/14/23 1436   Lab Results  Component Value Date   PLT 232 11/15/2023   Inpatient Medications  Scheduled Meds:  allopurinol   600 mg Oral Daily   Chlorhexidine  Gluconate Cloth  6 each Topical Daily   doxycycline   100 mg Oral Q12H   furosemide   40 mg Intravenous Q12H   heparin   5,000 Units Subcutaneous Q8H   methylPREDNISolone  (SOLU-MEDROL ) injection  40 mg Intravenous Q12H   metolazone   2.5 mg Oral  Daily   nicotine   21 mg Transdermal Daily   potassium chloride   40 mEq Oral BID   sodium chloride  flush  3 mL Intravenous Q12H   sodium chloride  flush  3 mL Intravenous Q12H   sotalol   80 mg Oral Q12H   Continuous Infusions: PRN Meds:.acetaminophen  **OR** acetaminophen , alprazolam , bisacodyl , ondansetron  **OR** ondansetron  (ZOFRAN ) IV, polyethylene glycol, sodium chloride  flush, traZODone    Anti-infectives (From admission, onward)    Start     Dose/Rate Route Frequency Ordered Stop   11/14/23 1545  doxycycline  (VIBRA -TABS) tablet 100 mg        100 mg Oral Every 12 hours 11/14/23 1445          Subjective: Larry Moore today has no fevers, no emesis,  No chest pain,    -Wife and daughter at bedside Cough and dyspnea is not worse -Voiding well  Objective: Vitals:   11/15/23 1200 11/15/23 1300 11/15/23 1400 11/15/23 1624  BP:   125/70   Pulse:   77   Resp: (!) 31 (!) 25 (!) 22   Temp:    98.2 F (36.8 C)  TempSrc:    Oral  SpO2: 94%  96%   Weight:      Height:        Intake/Output Summary (Last 24 hours) at 11/15/2023 1830 Last data filed at 11/15/2023 1400 Gross per 24 hour  Intake 480 ml  Output 3200 ml  Net -2720 ml   Filed Weights   11/14/23 1023 11/15/23 0437  Weight: 97.7 kg 99.5 kg    Physical Exam  Gen:- Awake Alert,  in no apparent distress  HEENT:- [place].AT, No sclera icterus Nose- [place] 6 L/min/alternating with BiPAP  Neck-Supple Neck, +ve JVD,.  Lungs-  -improving air movement, no significant wheezing  CV- S1, S2 normal, regular  Abd-  +ve B.Sounds, Abd Soft, No tenderness,    Extremity/Skin:-No significant edema, mild varicosities, good pedal pulses  Psych-affect is appropriate, oriented x3 Neuro-generalized weakness, no new focal deficits, no tremors  Data Reviewed: I have personally reviewed following labs and imaging studies  CBC: Recent Labs  Lab 11/14/23 1108 11/15/23 0336  WBC 11.4* 8.0  NEUTROABS 10.0*  --   HGB 17.3* 15.8  HCT  56.0* 50.7  MCV 96.9 95.7  PLT 248 232   Basic Metabolic Panel: Recent Labs  Lab 11/14/23 1108 11/15/23 0336  NA 141 141  K 3.5 3.3*  CL 98 93*  CO2 31 39*  GLUCOSE 112* 134*  BUN 12 14  CREATININE 0.77 0.78  CALCIUM 8.5* 7.9*  MG 1.9  --    GFR: Estimated Creatinine Clearance: 95.4 mL/min (by C-G formula based on SCr of 0.78 mg/dL).  Recent Results (from the past 240 hours)  Resp panel by RT-PCR (RSV, Flu A&B, Covid) Anterior Nasal Swab     Status: None   Collection Time: 11/14/23 10:23 AM   Specimen: Anterior Nasal Swab  Result Value Ref Range Status  SARS Coronavirus 2 by RT PCR NEGATIVE NEGATIVE Final    Comment: (NOTE) SARS-CoV-2 target nucleic acids are NOT DETECTED.  The SARS-CoV-2 RNA is generally detectable in upper respiratory specimens during the acute phase of infection. The lowest concentration of SARS-CoV-2 viral copies this assay can detect is 138 copies/mL. A negative result does not preclude SARS-Cov-2 infection and should not be used as the sole basis for treatment or other patient management decisions. A negative result may occur with  improper specimen collection/handling, submission of specimen other than nasopharyngeal swab, presence of viral mutation(s) within the areas targeted by this assay, and inadequate number of viral copies(<138 copies/mL). A negative result must be combined with clinical observations, patient history, and epidemiological information. The expected result is Negative.  Fact Sheet for Patients:  BloggerCourse.com  Fact Sheet for Healthcare Providers:  SeriousBroker.it  This test is no t yet approved or cleared by the [place]  FDA and  has been authorized for detection and/or diagnosis of SARS-CoV-2 by FDA under an Emergency Use Authorization (EUA). This EUA will remain  in effect (meaning this test can be used) for the duration of the COVID-19 declaration under  Section 564(b)(1) of the Act, 21 U.S.C.section 360bbb-3(b)(1), unless the authorization is terminated  or revoked sooner.       Influenza A by PCR NEGATIVE NEGATIVE Final   Influenza B by PCR NEGATIVE NEGATIVE Final    Comment: (NOTE) The Xpert Xpress SARS-CoV-2/FLU/RSV plus assay is intended as an aid in the diagnosis of influenza from Nasopharyngeal swab specimens and should not be used as a sole basis for treatment. Nasal washings and aspirates are unacceptable for Xpert Xpress SARS-CoV-2/FLU/RSV testing.  Fact Sheet for Patients: BloggerCourse.com  Fact Sheet for Healthcare Providers: SeriousBroker.it  This test is not yet approved or cleared by the [place]  FDA and has been authorized for detection and/or diagnosis of SARS-CoV-2 by FDA under an Emergency Use Authorization (EUA). This EUA will remain in effect (meaning this test can be used) for the duration of the COVID-19 declaration under Section 564(b)(1) of the Act, 21 U.S.C. section 360bbb-3(b)(1), unless the authorization is terminated or revoked.     Resp Syncytial Virus by PCR NEGATIVE NEGATIVE Final    Comment: (NOTE) Fact Sheet for Patients: BloggerCourse.com  Fact Sheet for Healthcare Providers: SeriousBroker.it  This test is not yet approved or cleared by the [place]  FDA and has been authorized for detection and/or diagnosis of SARS-CoV-2 by FDA under an Emergency Use Authorization (EUA). This EUA will remain in effect (meaning this test can be used) for the duration of the COVID-19 declaration under Section 564(b)(1) of the Act, 21 U.S.C. section 360bbb-3(b)(1), unless the authorization is terminated or revoked.  Performed at [facility], [address] [place], [place] [address]   Culture, blood (routine x 2)     Status: None (Preliminary result)   Collection Time: 11/14/23 11:08 AM    Specimen: BLOOD  Result Value Ref Range Status   Specimen Description BLOOD LEFT ANTECUBITAL  Final   Special Requests   Final    BOTTLES DRAWN AEROBIC AND ANAEROBIC Blood Culture adequate volume   Culture   Final    NO GROWTH < 24 HOURS Performed at [facility], [address], [place], [place] [address]    Report Status PENDING  Incomplete  Culture, blood (routine x 2)     Status: None (Preliminary result)   Collection Time: 11/14/23 11:08 AM   Specimen: BLOOD  Result Value Ref  Range Status   Specimen Description BLOOD RIGHT ANTECUBITAL  Final   Special Requests   Final    BOTTLES DRAWN AEROBIC AND ANAEROBIC Blood Culture adequate volume   Culture   Final    NO GROWTH < 24 HOURS Performed at [facility], [address], [place], [place] [address]    Report Status PENDING  Incomplete  MRSA Next Gen by PCR, Nasal     Status: None   Collection Time: 11/14/23  2:27 PM   Specimen: Nasal Mucosa; Nasal Swab  Result Value Ref Range Status   MRSA by PCR Next Gen NOT DETECTED NOT DETECTED Final    Comment: (NOTE) The GeneXpert MRSA Assay (FDA approved for NASAL specimens only), is one component of a comprehensive MRSA colonization surveillance program. It is not intended to diagnose MRSA infection nor to guide or monitor treatment for MRSA infections. Test performance is not FDA approved in patients less than 59 years old. Performed at [facility], [address] [place], [place] [address]      Radiology Studies: ECHOCARDIOGRAM COMPLETE Result Date: 11/15/2023    ECHOCARDIOGRAM REPORT   Patient Name:   Larry Moore [facility] Date of Exam: 11/15/2023 Medical Rec #:  [id number]         Height:       68.0 in Accession #:    [id number]        Weight:       219.4 lb Date of Birth:  Apr 18, 1951         BSA:          2.126 m Patient Age:    72 years          BP:           110/61 mmHg Patient Gender: M                 HR:           88 bpm. Exam Location:  [facility] Procedure: 2D Echo, Cardiac  Doppler, Color Doppler and Intracardiac            Opacification Agent Indications:    Dyspnea R06.00  History:        Patient has no prior history of Echocardiogram examinations.                 CHF, Arrythmias:Atrial Fibrillation; Risk Factors:Hypertension                 and Prediabetes. Hx of lung cancer and tobacco abuse,                 Obstructive sleep apnea syndrome.  Sonographer:    Denese Finn RCS Referring Phys: [phone] [place] IMPRESSIONS  1. Left ventricular ejection fraction, by estimation, is 55 to 60%. The left ventricle has normal function. The left ventricle has no regional wall motion abnormalities. Left ventricular diastolic function could not be evaluated.  2. Right ventricular systolic function appears to be reduced, suboptimal images despite definity  administration. The right ventricular size is normal. Tricuspid regurgitation signal is inadequate for assessing PA pressure.  3. Left atrial size was severely dilated.  4. Right atrial size was mildly dilated.  5. The mitral valve is normal in structure. No evidence of mitral valve regurgitation. No evidence of mitral stenosis.  6. The aortic valve was not well visualized. Aortic valve regurgitation is not visualized. No aortic stenosis is present.  7. Aortic dilatation noted. There is mild dilatation of the aortic root,  measuring 42 mm.  8. The inferior vena cava is dilated in size with >50% respiratory variability, suggesting right atrial pressure of 8 mmHg. Comparison(s): No prior Echocardiogram. FINDINGS  Left Ventricle: Left ventricular ejection fraction, by estimation, is 55 to 60%. The left ventricle has normal function. The left ventricle has no regional wall motion abnormalities. The left ventricular internal cavity size was normal in size. There is  no left ventricular hypertrophy. Left ventricular diastolic function could not be evaluated due to atrial fibrillation. Left ventricular diastolic function could not be evaluated.  Right Ventricle: The right ventricular size is normal. No increase in right ventricular wall thickness. Right ventricular systolic function appears to be reduced, suboptimal images despite definity  administration. Tricuspid regurgitation signal is inadequate for assessing PA pressure. Left Atrium: Left atrial size was severely dilated. Right Atrium: Right atrial size was mildly dilated. Pericardium: There is no evidence of pericardial effusion. Mitral Valve: The mitral valve is normal in structure. No evidence of mitral valve regurgitation. No evidence of mitral valve stenosis. Tricuspid Valve: The tricuspid valve is grossly normal. Tricuspid valve regurgitation is not demonstrated. No evidence of tricuspid stenosis. Aortic Valve: The aortic valve was not well visualized. Aortic valve regurgitation is not visualized. No aortic stenosis is present. Pulmonic Valve: The pulmonic valve was not well visualized. Pulmonic valve regurgitation is not visualized. No evidence of pulmonic stenosis. Aorta: Aortic dilatation noted. There is mild dilatation of the aortic root, measuring 42 mm. Venous: The inferior vena cava is dilated in size with greater than 50% respiratory variability, suggesting right atrial pressure of 8 mmHg. IAS/Shunts: No atrial level shunt detected by color flow Doppler.  LEFT VENTRICLE PLAX 2D LVIDd:         5.10 cm   Diastology LVIDs:         3.50 cm   LV e' medial:    6.09 cm/s LV PW:         1.00 cm   LV E/e' medial:  14.5 LV IVS:        1.00 cm   LV e' lateral:   8.16 cm/s LVOT diam:     2.20 cm   LV E/e' lateral: 10.9 LV SV:         60 LV SV Index:   28 LVOT Area:     3.80 cm  RIGHT VENTRICLE RV S prime:     12.40 cm/s TAPSE (M-mode): 1.7 cm LEFT ATRIUM            Index        RIGHT ATRIUM           Index LA diam:      4.70 cm  2.21 cm/m   RA Area:     21.70 cm LA Vol (A2C): 93.1 ml  43.80 ml/m  RA Volume:   66.90 ml  31.47 ml/m LA Vol (A4C): 136.0 ml 63.98 ml/m  AORTIC VALVE LVOT Vmax:    86.50 cm/s LVOT Vmean:  59.800 cm/s LVOT VTI:    0.159 m  AORTA Ao Root diam: 4.20 cm MITRAL VALVE MV Area (PHT): 3.27 cm    SHUNTS MV Decel Time: 232 msec    Systemic VTI:  0.16 m MV E velocity: 88.60 cm/s  Systemic Diam: 2.20 cm MV A velocity: 40.70 cm/s MV E/A ratio:  2.18 Vishnu Priya Mallipeddi Electronically signed by Lucetta Russel Mallipeddi Signature Date/Time: 11/15/2023/1:16:44 PM    Final    DG Chest Port 1 View Result Date: 11/14/2023 CLINICAL  DATA:  Worsening shortness of breath. History of lung cancer. EXAM: PORTABLE CHEST 1 VIEW COMPARISON:  CT chest dated September 27, 2023. Chest x-ray dated January 19, 2022. FINDINGS: Patient is rotated to the right. Left chest wall port catheter. Unchanged cardiomegaly. Diffuse interstitial and hazy airspace opacities throughout both lungs moderate right and small left pleural effusions. No pneumothorax. No acute osseous abnormality. IMPRESSION: 1. Moderate congestive heart failure. Electronically Signed   By: Aleta Anda M.D.   On: 11/14/2023 11:31   Scheduled Meds:  allopurinol   600 mg Oral Daily   Chlorhexidine  Gluconate Cloth  6 each Topical Daily   doxycycline   100 mg Oral Q12H   furosemide   40 mg Intravenous Q12H   heparin   5,000 Units Subcutaneous Q8H   methylPREDNISolone  (SOLU-MEDROL ) injection  40 mg Intravenous Q12H   metolazone   2.5 mg Oral Daily   nicotine   21 mg Transdermal Daily   potassium chloride   40 mEq Oral BID   sodium chloride  flush  3 mL Intravenous Q12H   sodium chloride  flush  3 mL Intravenous Q12H   sotalol   80 mg Oral Q12H   Continuous Infusions:   LOS: 1 day   Colin Dawley M.D on 11/15/2023 at 6:30 PM  Go to [email or web address] - for contact info  [facility] - Office  [phone]  If 7PM-7AM, please contact night-coverage [email or web address] 11/15/2023, 6:30 PM

## 2023-11-15 NOTE — Progress Notes (Signed)
 Patient already off BIpap this morning. He is on 6lpm [place].

## 2023-11-15 NOTE — Plan of Care (Signed)

## 2023-11-16 ENCOUNTER — Other Ambulatory Visit: Payer: PPO

## 2023-11-16 ENCOUNTER — Inpatient Hospital Stay (HOSPITAL_COMMUNITY): Payer: PPO

## 2023-11-16 ENCOUNTER — Ambulatory Visit (HOSPITAL_COMMUNITY): Admission: RE | Admit: 2023-11-16 | Payer: PPO | Source: Ambulatory Visit

## 2023-11-16 ENCOUNTER — Inpatient Hospital Stay: Payer: PPO

## 2023-11-16 DIAGNOSIS — I5033 Acute on chronic diastolic (congestive) heart failure: Secondary | ICD-10-CM | POA: Diagnosis not present

## 2023-11-16 DIAGNOSIS — J9621 Acute and chronic respiratory failure with hypoxia: Secondary | ICD-10-CM | POA: Diagnosis not present

## 2023-11-16 DIAGNOSIS — C3490 Malignant neoplasm of unspecified part of unspecified bronchus or lung: Secondary | ICD-10-CM | POA: Diagnosis not present

## 2023-11-16 DIAGNOSIS — J9622 Acute and chronic respiratory failure with hypercapnia: Secondary | ICD-10-CM | POA: Diagnosis not present

## 2023-11-16 DIAGNOSIS — I480 Paroxysmal atrial fibrillation: Secondary | ICD-10-CM | POA: Diagnosis not present

## 2023-11-16 DIAGNOSIS — Z720 Tobacco use: Secondary | ICD-10-CM | POA: Diagnosis not present

## 2023-11-16 DIAGNOSIS — I509 Heart failure, unspecified: Secondary | ICD-10-CM | POA: Diagnosis not present

## 2023-11-16 DIAGNOSIS — J9601 Acute respiratory failure with hypoxia: Secondary | ICD-10-CM | POA: Diagnosis not present

## 2023-11-16 DIAGNOSIS — F17210 Nicotine dependence, cigarettes, uncomplicated: Secondary | ICD-10-CM

## 2023-11-16 LAB — BASIC METABOLIC PANEL
Anion gap: 11 (ref 5–15)
BUN: 17 mg/dL (ref 8–23)
CO2: 40 mmol/L — ABNORMAL HIGH (ref 22–32)
Calcium: 7.8 mg/dL — ABNORMAL LOW (ref 8.9–10.3)
Chloride: 91 mmol/L — ABNORMAL LOW (ref 98–111)
Creatinine, Ser: 0.88 mg/dL (ref 0.61–1.24)
GFR, Estimated: >60 mL/min (ref >60)
Glucose, Bld: 86 mg/dL (ref 70–99)
Potassium: 3.1 mmol/L — ABNORMAL LOW (ref 3.5–5.1)
Sodium: 142 mmol/L (ref 135–145)

## 2023-11-16 MED ORDER — POTASSIUM CHLORIDE CRYS ER 20 MEQ PO TBCR
40.0000 meq | EXTENDED_RELEASE_TABLET | Freq: Once | ORAL | Status: AC
  Administered 2023-11-16: 40 meq via ORAL

## 2023-11-16 MED ORDER — POTASSIUM CHLORIDE CRYS ER 20 MEQ PO TBCR
40.0000 meq | EXTENDED_RELEASE_TABLET | Freq: Once | ORAL | Status: AC
  Administered 2023-11-16: 40 meq via ORAL
  Filled 2023-11-16 (×2): qty 2

## 2023-11-16 NOTE — Evaluation (Signed)
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  No fever  Or chills   Clinical Impression  Patient functioning near baseline for functional mobility and gait with good return for ambulating in room without loss of balance or need for an AD. Plan: patient discharged from physical therapy to care of nursing for ambulation daily as tolerated for length of stay          If plan is discharge home, recommend the following: Help with stairs or ramp for entrance   Can travel by private vehicle        Equipment Recommendations None recommended by PT  Recommendations for Other Services       Functional Status Assessment Patient has had a recent decline in their functional status and/or demonstrates limited ability to make significant improvements in function in a reasonable and predictable amount of time     Precautions / Restrictions Precautions Precautions: None Restrictions Weight Bearing Restrictions Per Provider Order: No      Mobility  Bed Mobility Overal bed mobility: Modified Independent                  Transfers Overall transfer level: Modified independent                      Ambulation/Gait Ambulation/Gait assistance: Modified independent (Device/Increase time) Gait Distance (Feet): 100 Feet Assistive device: None Gait Pattern/deviations: WFL(Within Functional Limits) Gait velocity: decreased      General Gait Details: grossly WFL with good return for ambulating in room without loss of balance or need for an AD, on 2 LPM with SpO2 at 92%  Stairs            Wheelchair Mobility     Tilt Bed    Modified Rankin (Stroke Patients Only)       Balance Overall balance assessment: No apparent balance deficits (not formally assessed)                                           Pertinent Vitals/Pain Pain Assessment Pain Assessment: No/denies pain    Home Living Family/patient expects to be discharged to:: Private residence Living Arrangements: Spouse/significant other Available Help at Discharge: Family;Available 24 hours/day Type of Home: House Home Access: Level entry       Home Layout: One level Home Equipment: Grab bars - tub/shower;Rolling Walker (2 wheels)      Prior Function Prior Level of Function : Independent/Modified Independent;Driving             Mobility Comments: Community ambulation without AD, drives, 2 LPM home O2 constant ADLs Comments: Independent     Extremity/Trunk Assessment   Upper Extremity Assessment Upper Extremity Assessment: Overall WFL for tasks assessed    Lower Extremity Assessment Lower Extremity Assessment: Overall WFL for tasks assessed    Cervical /  Trunk Assessment Cervical / Trunk Assessment: Normal  Communication   Communication Communication: No apparent difficulties    Cognition Arousal: Alert Behavior During Therapy: WFL for tasks assessed/performed   PT - Cognitive impairments: No apparent impairments                                 Cueing       General Comments      Exercises     Assessment/Plan    PT Assessment Patient does not need any further PT services  PT Problem List         PT Treatment Interventions      PT Goals (Current goals can be found in the Care Plan section)  Acute Rehab PT Goals Patient Stated Goal: return home with family to assist PT  Goal Formulation: With patient Time For Goal Achievement: 11/16/23 Potential to Achieve Goals: Good    Frequency       Co-evaluation               AM-PAC PT "6 Clicks" Mobility  Outcome Measure Help needed turning from your back to your side while in a flat bed without using bedrails?: None Help needed moving from lying on your back to sitting on the side of a flat bed without using bedrails?: None Help needed moving to and from a bed to a chair (including a wheelchair)?: None Help needed standing up from a chair using your arms (e.g., wheelchair or bedside chair)?: None Help needed to walk in hospital room?: A Little Help needed climbing 3-5 steps with a railing? : A Little 6 Click Score: 22    End of Session Equipment Utilized During Treatment: Oxygen Activity Tolerance: Patient tolerated treatment well;Patient limited by fatigue Patient left: in chair;with call bell/phone within reach Nurse Communication: Mobility status PT Visit Diagnosis: Unsteadiness on feet (R26.81);Other abnormalities of gait and mobility (R26.89);Muscle weakness (generalized) (M62.81)    Time: 4098-1191 PT Time Calculation (min) (ACUTE ONLY): 28 min   Charges:   PT Evaluation $PT Eval Moderate Complexity: 1 Mod PT Treatments $Therapeutic Activity: 23-37 mins PT General Charges $$ ACUTE PT VISIT: 1 Visit         3:56 PM, 11/16/23 Ocie Bob, MPT Physical Therapist with [facility] [phone] office [phone] mobile phone

## 2023-11-16 NOTE — Progress Notes (Addendum)
PROGRESS NOTE  Larry Moore, is a 73 y.o. 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 chemoimmunotherapy who is chronically on home O2 at 2 L per nasal cannula admitted on 11/13/2020 with acute on chronic hypercapnic and hypoxic respiratory failure -Patient was initially placed on home O2 since 10/05/2023   -Assessment and Plan: 1)Acute on chronic Hypoxic and Hypercapnic Respiratory Failure--- suspect systolic CHF related -COPD and --H/o Small Cell Lung Ca--with 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 Ellin Saba at discharge please discharge on prednisone 60 mg daily until patient sees Dr. Ellin Saba in the clinic on 11/25/2023   2)HFpEF--acute on chronic Diastolic (Preserved EF) CHF -HFpEF  -Repeat Echo from 11/15/23----EF is 55 to 60 %, No mitral stenosis, No Aortic Stenosis --- Most likely responsible for #1  above Troponin initially 12 repeat troponin 15,  EKG sinus rhythm without acute ACS type findings -BNP and CHF findings above 1 -IV Lasix as above in # 1 -Repeat echo from 11/15/23 as above #1 11/16/23 -Weight is trending down Negative Fluid Balance  Intake/Output Summary (Last 24 hours) at 11/16/2023 1641 Last data filed at 11/16/2023 1139 Gross per 24 hour  Intake 240 ml  Output 3650 ml  Net -3410 ml   -Plan is for diagnostic and therapeutic right-sided thoracentesis on 11/17/2023  3) acute COPD exacerbation--patient with ongoing tobacco use WBC 11.4, lactic acid is 1.2 repeat 0.8 procalcitonin <0.10 -VBG consistent with compensated respiratory acidosis -Continue doxycycline -Bronchodilators and mucolytics -Continue IV Solu-Medrol -Continue nicotine patch -H/o Small Cell Lung Ca--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 Ellin Saba at discharge please discharge on prednisone 60 mg daily until patient sees Dr. Ellin Saba in the clinic on 11/25/2023   4)Extensive stage small cell Rt Lung cancer:  - s/p radiation chemoimmunotherapy - - Possible immunotherapy induced Pneumonitis--per Dr. Ellin Saba at discharge please discharge on prednisone 60 mg daily until patient sees Dr. 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 Gary Fleet on 01/29/2020. 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   Status is: Inpatient   Disposition: The patient is from: Home              Anticipated d/c is to: Home              Anticipated d/c date is: 1 day              Patient currently is not medically stable to d/c. Barriers: Not Clinically Stable-   Code Status :  -  Code Status: Full Code   Family Communication:    (patient is alert, awake and coherent)  Discussed with wife and daughter at bedside  DVT Prophylaxis  :   - SCDs  heparin injection 5,000 Units Start: 11/14/23 2200 SCDs Start: 11/14/23 1436 Place TED hose Start: 11/14/23 1436   Lab Results  Component Value Date   PLT 232 11/15/2023   Inpatient Medications  Scheduled Meds:  allopurinol  600 mg Oral Daily   Chlorhexidine Gluconate Cloth  6 each Topical Daily   doxycycline  100 mg Oral Q12H   furosemide  40 mg Intravenous Q12H   heparin  5,000 Units Subcutaneous Q8H   methylPREDNISolone (SOLU-MEDROL) injection  40 mg Intravenous Q12H   metolazone  2.5 mg Oral Daily   nicotine  21 mg Transdermal Daily   potassium chloride  40 mEq Oral BID   sodium chloride flush  3 mL Intravenous Q12H   sodium chloride flush  3 mL Intravenous Q12H   sotalol  80 mg Oral Q12H   Continuous Infusions: PRN Meds:.acetaminophen **OR** acetaminophen, alprazolam, bisacodyl, ondansetron **OR** ondansetron (ZOFRAN) IV, polyethylene glycol, sodium chloride flush, traZODone   Anti-infectives (From admission, onward)    Start     Dose/Rate Route Frequency Ordered Stop   11/14/23 1545  doxycycline (VIBRA-TABS) tablet 100 mg        100 mg Oral Every 12 hours 11/14/23 1445         Subjective: Larry Moore today has no fevers, no emesis,  No chest pain,    -Wife at bedside, questions answered -Cough and dyspnea improving Voiding well -Patient and wife are agreeable for  diagnostic and therapeutic right-sided thoracentesis on 11/17/2023  Objective: Vitals:   11/16/23  1100 11/16/23 1138 11/16/23 1200 11/16/23 1300  BP: 107/61  100/62 102/62  Pulse: 69  60 74  Resp: (!) 32  (!) 21 (!) 31  Temp:  (!) 97.4 F (36.3 C)    TempSrc:  Oral    SpO2: 93%  96% 90%  Weight:      Height:        Intake/Output Summary (Last 24 hours) at 11/16/2023 1614 Last data filed at 11/16/2023 1139 Gross per 24 hour  Intake 240 ml  Output 3650 ml  Net -3410 ml   Filed Weights   11/14/23 1023 11/15/23 0437 11/16/23 0500  Weight: 97.7 kg 99.5 kg 92.7 kg    Physical Exam Gen:- Awake Alert,  in no apparent distress  HEENT:- [place].AT, No sclera icterus Nose- [place] 5 L/min/alternating with BiPAP  Neck-Supple Neck, +ve JVD,.  Lungs-  -improving air movement, no significant wheezing  CV- S1, S2 normal, regular  Abd-  +ve B.Sounds, Abd Soft, No tenderness,    Extremity/Skin:-No significant edema, mild varicosities, good pedal pulses  Psych-affect is appropriate, oriented x3 Neuro-Generalized weakness, no new focal deficits,  no tremors  Data Reviewed: I have personally reviewed following labs and imaging studies  CBC: Recent Labs  Lab 11/14/23 1108 11/15/23 0336  WBC 11.4* 8.0  NEUTROABS 10.0*  --   HGB 17.3* 15.8  HCT 56.0* 50.7  MCV 96.9 95.7  PLT 248 232   Basic Metabolic Panel: Recent Labs  Lab 11/14/23 1108 11/15/23 0336 11/16/23 0410  NA 141 141 142  K 3.5 3.3* 3.1*  CL 98 93* 91*  CO2 31 39* 40*  GLUCOSE 112* 134* 86  BUN 12 14 17   CREATININE 0.77 0.78 0.88  CALCIUM 8.5* 7.9* 7.8*  MG 1.9  --   --    GFR: Estimated Creatinine Clearance: 83.8 mL/min (by C-G formula based on SCr of 0.88 mg/dL).  Recent Results (from the past 240 hours)  Resp panel by RT-PCR (RSV, Flu A&B, Covid) Anterior Nasal Swab     Status: None   Collection Time: 11/14/23 10:23 AM   Specimen: Anterior Nasal Swab  Result Value Ref Range Status   SARS Coronavirus 2 by RT PCR NEGATIVE NEGATIVE Final    Comment: (NOTE) SARS-CoV-2 target nucleic acids are NOT DETECTED.  The  SARS-CoV-2 RNA is generally detectable in upper respiratory specimens during the acute phase of infection. The lowest concentration of SARS-CoV-2 viral copies this assay can detect is 138 copies/mL. A negative result does not preclude SARS-Cov-2 infection and should not be used as the sole basis for treatment or other patient management decisions. A negative result may occur with  improper specimen collection/handling, submission of specimen other than nasopharyngeal swab, presence of viral mutation(s) within the areas targeted by this assay, and inadequate number of viral copies(<138 copies/mL). A negative result must be combined with clinical observations, patient history, and epidemiological information. The expected result is Negative.  Fact Sheet for Patients:  BloggerCourse.com  Fact Sheet for Healthcare Providers:  SeriousBroker.it  This test is no t yet approved or cleared by the [place] FDA and  has been authorized for detection and/or diagnosis of SARS-CoV-2 by FDA under an Emergency Use Authorization (EUA). This EUA will remain  in effect (meaning this test can be used) for the duration of the COVID-19 declaration under Section 564(b)(1) of the Act, 21 U.S.C.section 360bbb-3(b)(1), unless the authorization is terminated  or revoked sooner.       Influenza A by PCR NEGATIVE NEGATIVE Final   Influenza B by PCR NEGATIVE NEGATIVE Final    Comment: (NOTE) The Xpert Xpress SARS-CoV-2/FLU/RSV plus assay is intended as an aid in the diagnosis of influenza from Nasopharyngeal swab specimens and should not be used as a sole basis for treatment. Nasal washings and aspirates are unacceptable for Xpert Xpress SARS-CoV-2/FLU/RSV testing.  Fact Sheet for Patients: BloggerCourse.com  Fact Sheet for Healthcare Providers: SeriousBroker.it  This test is not yet approved or  cleared by the [place] FDA and has been authorized for detection and/or diagnosis of SARS-CoV-2 by FDA under an Emergency Use Authorization (EUA). This EUA will remain in effect (meaning this test can be used) for the duration of the COVID-19 declaration under Section 564(b)(1) of the Act, 21 U.S.C. section 360bbb-3(b)(1), unless the authorization is terminated or revoked.     Resp Syncytial Virus by PCR NEGATIVE NEGATIVE Final    Comment: (NOTE) Fact Sheet for Patients: BloggerCourse.com  Fact Sheet for Healthcare Providers: SeriousBroker.it  This test is not yet approved or cleared by the [place] FDA and has been authorized for detection and/or diagnosis of SARS-CoV-2  by FDA under an Emergency Use Authorization (EUA). This EUA will remain in effect (meaning this test can be used) for the duration of the COVID-19 declaration under Section 564(b)(1) of the Act, 21 U.S.C. section 360bbb-3(b)(1), unless the authorization is terminated or revoked.  Performed at [facility], [address], [place], [place] [address]   Culture, blood (routine x 2)     Status: None (Preliminary result)   Collection Time: 11/14/23 11:08 AM   Specimen: BLOOD  Result Value Ref Range Status   Specimen Description BLOOD LEFT ANTECUBITAL  Final   Special Requests   Final    BOTTLES DRAWN AEROBIC AND ANAEROBIC Blood Culture adequate volume   Culture   Final    NO GROWTH 2 DAYS Performed at [facility], [address], [place], [place] [address]    Report Status PENDING  Incomplete  Culture, blood (routine x 2)     Status: None (Preliminary result)   Collection Time: 11/14/23 11:08 AM   Specimen: BLOOD  Result Value Ref Range Status   Specimen Description BLOOD RIGHT ANTECUBITAL  Final   Special Requests   Final    BOTTLES DRAWN AEROBIC AND ANAEROBIC Blood Culture adequate volume   Culture   Final    NO GROWTH 2 DAYS Performed at [facility], [address], [place], [place] [address]    Report Status PENDING  Incomplete  MRSA Next Gen by PCR, Nasal     Status: None   Collection Time: 11/14/23  2:27 PM   Specimen: Nasal Mucosa; Nasal Swab  Result Value Ref Range Status   MRSA by PCR Next Gen NOT DETECTED NOT DETECTED Final    Comment: (NOTE) The GeneXpert MRSA Assay (FDA approved for NASAL specimens only), is one component of a comprehensive MRSA colonization surveillance program. It is not intended to diagnose MRSA infection nor to guide or monitor treatment for MRSA infections. Test performance is not FDA approved in patients less than 40 years old. Performed at [facility], [address], [place], [place] [address]     Radiology Studies: DG Chest 2 View Result Date: 11/16/2023 CLINICAL DATA:  Shortness of breath EXAM: CHEST - 2 VIEW COMPARISON:  11/14/2023 FINDINGS: Cardiac shadow remains enlarged. Left chest port is noted. Previously seen interstitial and alveolar edema has improved when compared with the prior exam. Patchy consolidative changes are noted in the bases bilaterally right slightly worse than left. Right-sided effusion is noted as well. IMPRESSION: Improvement in CHF over the interval from the prior exam. Basilar airspace opacities are noted right worse than left. Electronically Signed   By: Alcide Clever M.D.   On: 11/16/2023 10:12   ECHOCARDIOGRAM COMPLETE Result Date: 11/15/2023    ECHOCARDIOGRAM REPORT   Patient Name:   Larry Moore [facility] Date of Exam: 11/15/2023 Medical Rec #:  [id number]         Height:       68.0 in Accession #:    [id number]        Weight:       219.4 lb Date of Birth:  January 11, 1951         BSA:          2.126 m Patient Age:    72 years          BP:           110/61 mmHg Patient Gender: M                 HR:  88 bpm. [facility] Procedure: 2D Echo, Cardiac Doppler, Color Doppler and Intracardiac            Opacification Agent Indications:    Dyspnea R06.00  History:         Patient has no prior history of Echocardiogram examinations.                 CHF, Arrythmias:Atrial Fibrillation; Risk Factors:Hypertension                 and Prediabetes. Hx of lung cancer and tobacco abuse,                 Obstructive sleep apnea syndrome.  Sonographer:    Celesta Gentile RCS Referring Phys: [phone] Xaine Sansom IMPRESSIONS  1. Left ventricular ejection fraction, by estimation, is 55 to 60%. The left ventricle has normal function. The left ventricle has no regional wall motion abnormalities. Left ventricular diastolic function could not be evaluated.  2. Right ventricular systolic function appears to be reduced, suboptimal images despite definity administration. The right ventricular size is normal. Tricuspid regurgitation signal is inadequate for assessing PA pressure.  3. Left atrial size was severely dilated.  4. Right atrial size was mildly dilated.  5. The mitral valve is normal in structure. No evidence of mitral valve regurgitation. No evidence of mitral stenosis.  6. The aortic valve was not well visualized. Aortic valve regurgitation is not visualized. No aortic stenosis is present.  7. Aortic dilatation noted. There is mild dilatation of the aortic root, measuring 42 mm.  8. The inferior vena cava is dilated in size with >50% respiratory variability, suggesting right atrial pressure of 8 mmHg. Comparison(s): No prior Echocardiogram. FINDINGS  Left Ventricle: Left ventricular ejection fraction, by estimation, is 55 to 60%. The left ventricle has normal function. The left ventricle has no regional wall motion abnormalities. The left ventricular internal cavity size was normal in size. There is  no left ventricular hypertrophy. Left ventricular diastolic function could not be evaluated due to atrial fibrillation. Left ventricular diastolic function could not be evaluated. Right Ventricle: The right ventricular size is normal. No increase in right ventricular wall thickness. Right  ventricular systolic function appears to be reduced, suboptimal images despite definity administration. Tricuspid regurgitation signal is inadequate for assessing PA pressure. Left Atrium: Left atrial size was severely dilated. Right Atrium: Right atrial size was mildly dilated. Pericardium: There is no evidence of pericardial effusion. Mitral Valve: The mitral valve is normal in structure. No evidence of mitral valve regurgitation. No evidence of mitral valve stenosis. Tricuspid Valve: The tricuspid valve is grossly normal. Tricuspid valve regurgitation is not demonstrated. No evidence of tricuspid stenosis. Aortic Valve: The aortic valve was not well visualized. Aortic valve regurgitation is not visualized. No aortic stenosis is present. Pulmonic Valve: The pulmonic valve was not well visualized. Pulmonic valve regurgitation is not visualized. No evidence of pulmonic stenosis. Aorta: Aortic dilatation noted. There is mild dilatation of the aortic root, measuring 42 mm. Venous: The inferior vena cava is dilated in size with greater than 50% respiratory variability, suggesting right atrial pressure of 8 mmHg. IAS/Shunts: No atrial level shunt detected by color flow Doppler.  LEFT VENTRICLE PLAX 2D LVIDd:         5.10 cm   Diastology LVIDs:         3.50 cm   LV e' medial:    6.09 cm/s LV PW:  1.00 cm   LV E/e' medial:  14.5 LV IVS:        1.00 cm   LV e' lateral:   8.16 cm/s LVOT diam:     2.20 cm   LV E/e' lateral: 10.9 LV SV:         60 LV SV Index:   28 LVOT Area:     3.80 cm  RIGHT VENTRICLE RV S prime:     12.40 cm/s TAPSE (M-mode): 1.7 cm LEFT ATRIUM            Index        RIGHT ATRIUM           Index LA diam:      4.70 cm  2.21 cm/m   RA Area:     21.70 cm LA Vol (A2C): 93.1 ml  43.80 ml/m  RA Volume:   66.90 ml  31.47 ml/m LA Vol (A4C): 136.0 ml 63.98 ml/m  AORTIC VALVE LVOT Vmax:   86.50 cm/s LVOT Vmean:  59.800 cm/s LVOT VTI:    0.159 m  AORTA Ao Root diam: 4.20 cm MITRAL VALVE MV Area (PHT):  3.27 cm    SHUNTS MV Decel Time: 232 msec    Systemic VTI:  0.16 m MV E velocity: 88.60 cm/s  Systemic Diam: 2.20 cm MV A velocity: 40.70 cm/s MV E/A ratio:  2.18 Vishnu Priya Mallipeddi Electronically signed by Winfield Rast Mallipeddi Signature Date/Time: 11/15/2023/1:16:44 PM    Final    Scheduled Meds:  allopurinol  600 mg Oral Daily   Chlorhexidine Gluconate Cloth  6 each Topical Daily   doxycycline  100 mg Oral Q12H   furosemide  40 mg Intravenous Q12H   heparin  5,000 Units Subcutaneous Q8H   methylPREDNISolone (SOLU-MEDROL) injection  40 mg Intravenous Q12H   metolazone  2.5 mg Oral Daily   nicotine  21 mg Transdermal Daily   potassium chloride  40 mEq Oral BID   sodium chloride flush  3 mL Intravenous Q12H   sodium chloride flush  3 mL Intravenous Q12H   sotalol  80 mg Oral Q12H   Continuous Infusions:   LOS: 2 days   Shon Hale M.D on 11/16/2023 at 4:14 PM  Go to [email or web address] - for contact info  [facility] - Office  [phone]  If 7PM-7AM, please contact night-coverage [email or web address] 11/16/2023, 4:14 PM

## 2023-11-16 NOTE — Plan of Care (Signed)
  Problem: Education: Goal: Knowledge of General Education information will improve Description: Including pain rating scale, medication(s)/side effects and non-pharmacologic comfort measures 11/16/2023 2053 by Corrie Mckusick, RN Outcome: Progressing 11/16/2023 2052 by Corrie Mckusick, RN Outcome: Progressing   Problem: Health Behavior/Discharge Planning: Goal: Ability to manage health-related needs will improve 11/16/2023 2053 by Corrie Mckusick, RN Outcome: Progressing 11/16/2023 2052 by Corrie Mckusick, RN Outcome: Progressing   Problem: Clinical Measurements: Goal: Ability to maintain clinical measurements within normal limits will improve 11/16/2023 2053 by Corrie Mckusick, RN Outcome: Progressing 11/16/2023 2052 by Corrie Mckusick, RN Outcome: Progressing Goal: Will remain free from infection 11/16/2023 2053 by Corrie Mckusick, RN Outcome: Progressing 11/16/2023 2052 by Corrie Mckusick, RN Outcome: Progressing Goal: Diagnostic test results will improve 11/16/2023 2053 by Corrie Mckusick, RN Outcome: Progressing 11/16/2023 2052 by Corrie Mckusick, RN Outcome: Progressing Goal: Respiratory complications will improve 11/16/2023 2053 by Corrie Mckusick, RN Outcome: Progressing 11/16/2023 2052 by Corrie Mckusick, RN Outcome: Progressing Goal: Cardiovascular complication will be avoided 11/16/2023 2053 by Corrie Mckusick, RN Outcome: Progressing 11/16/2023 2052 by Corrie Mckusick, RN Outcome: Progressing   Problem: Activity: Goal: Risk for activity intolerance will decrease 11/16/2023 2053 by Corrie Mckusick, RN Outcome: Progressing 11/16/2023 2052 by Corrie Mckusick, RN Outcome: Progressing   Problem: Nutrition: Goal: Adequate nutrition will be maintained 11/16/2023 2053 by Corrie Mckusick, RN Outcome: Progressing 11/16/2023 2052 by Corrie Mckusick, RN Outcome: Progressing   Problem: Coping: Goal: Level of anxiety will decrease 11/16/2023  2053 by Corrie Mckusick, RN Outcome: Progressing 11/16/2023 2052 by Corrie Mckusick, RN Outcome: Progressing   Problem: Elimination: Goal: Will not experience complications related to bowel motility 11/16/2023 2053 by Corrie Mckusick, RN Outcome: Progressing 11/16/2023 2052 by Corrie Mckusick, RN Outcome: Progressing Goal: Will not experience complications related to urinary retention 11/16/2023 2053 by Corrie Mckusick, RN Outcome: Progressing 11/16/2023 2052 by Corrie Mckusick, RN Outcome: Progressing   Problem: Pain Managment: Goal: General experience of comfort will improve and/or be controlled 11/16/2023 2053 by Corrie Mckusick, RN Outcome: Progressing 11/16/2023 2052 by Corrie Mckusick, RN Outcome: Progressing   Problem: Safety: Goal: Ability to remain free from injury will improve 11/16/2023 2053 by Corrie Mckusick, RN Outcome: Progressing 11/16/2023 2052 by Corrie Mckusick, RN Outcome: Progressing   Problem: Skin Integrity: Goal: Risk for impaired skin integrity will decrease 11/16/2023 2053 by Corrie Mckusick, RN Outcome: Progressing 11/16/2023 2052 by Corrie Mckusick, RN Outcome: Progressing

## 2023-11-16 NOTE — Plan of Care (Signed)

## 2023-11-16 NOTE — Consult Note (Signed)
[facility] Consultation Oncology  Name: Larry Moore      MRN: [id number]    Location: IC04/IC04-01  Date: 11/16/2023 Time:5:08 PM   REFERRING PHYSICIAN: Dr. 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  He has received chemotherapy from 02/23/2022 through 04/29/2022 followed by maintenance atezolizumab until 09/01/2023 which was held due to pneumonitis.  Most recent CT CAP on 09/27/2023 showed new moderate right pleural effusion and increased consolidation in the right lung and parts of the left lung with no adenopathy or metastatic disease.  He was treated with prednisone for pneumonitis.  He was tapering off at 5 mg alternating with 10 mg prior to presentation to the ER on 11/14/2023 with worsening breathing.  A chest x-ray was done which showed clinical signs of CHF.  He was started on diuresis and steroids.  Today he is able to walk and sit up in the chair.  He is on oxygen via nasal cannula satting around 90%.  PAST MEDICAL HISTORY:   Past Medical History:  Diagnosis Date   Dyspnea    Dysrhythmia    hx a-fib   Hypertension    Insomnia    Pre-diabetes    Sleep apnea     ALLERGIES: No Known Allergies    MEDICATIONS: I have reviewed the patient's current medications.     PAST SURGICAL HISTORY Past Surgical History:  Procedure Laterality Date   ATRIAL FIBRILLATION ABLATION     approx 2020   CARDIAC CATHETERIZATION     HERNIA REPAIR     IR IMAGING GUIDED PORT INSERTION  02/17/2022   IR VENO/JUGULAR RIGHT  02/17/2022   VIDEO BRONCHOSCOPY WITH ENDOBRONCHIAL ULTRASOUND Bilateral 01/29/2022   Procedure: VIDEO BRONCHOSCOPY WITH ENDOBRONCHIAL ULTRASOUND;  Surgeon: Josephine Igo, DO;  Location: [facility] OR;  Service: Cardiopulmonary;  Laterality: Bilateral;  w/ Guardant [id number]    FAMILY HISTORY: Family History  Problem Relation Age of Onset    Coronary artery disease Father    Diabetes Father     SOCIAL HISTORY:  reports that he has been smoking cigarettes. He has been exposed to tobacco smoke. He does not have any smokeless tobacco history on file. He reports that he does not currently use alcohol. He reports that he does not use drugs.  PERFORMANCE STATUS: The patient's performance status is 1 - Symptomatic but completely ambulatory  PHYSICAL EXAM: Most Recent Vital Signs: Blood pressure 102/62, pulse 74, temperature 98.3 F (36.8 C), temperature source Oral, resp. rate (!) 31, height 5\' 8"  (1.727 m), weight 204 lb 5.9 oz (92.7 kg), SpO2 90%. BP 102/62   Pulse 74   Temp 98.3 F (36.8 C) (Oral)   Resp (!) 31   Ht 5\' 8"  (1.727 m)   Wt 204 lb 5.9 oz (92.7 kg)   SpO2 90%   BMI 31.07 kg/m  General appearance: alert, cooperative, and appears stated age Lungs:  Decreased breath sounds at the bases, right more than left. Heart: regular rate and rhythm Extremities:  No edema.  LABORATORY DATA:  Results for orders placed or performed during the hospital encounter of 11/14/23 (from the past 48 hours)  Basic metabolic panel     Status: Abnormal   Collection Time: 11/15/23  3:36 AM  Result Value Ref Range   Sodium 141 135 - 145 mmol/L   Potassium 3.3 (L) 3.5 - 5.1 mmol/L  Chloride 93 (L) 98 - 111 mmol/L   CO2 39 (H) 22 - 32 mmol/L   Glucose, Bld 134 (H) 70 - 99 mg/dL    Comment: Glucose reference range applies only to samples taken after fasting for at least 8 hours.   BUN 14 8 - 23 mg/dL   Creatinine, Ser 9.52 0.61 - 1.24 mg/dL   Calcium 7.9 (L) 8.9 - 10.3 mg/dL   GFR, Estimated >84 >13 mL/min    Comment: (NOTE) Calculated using the CKD-EPI Creatinine Equation (2021)    Anion gap 9 5 - 15    Comment: Performed at [facility], [address], [place], [place] [address]  CBC     Status: Abnormal   Collection Time: 11/15/23  3:36 AM  Result Value Ref Range   WBC 8.0 4.0 - 10.5 K/uL   RBC 5.30 4.22 - 5.81 MIL/uL    Hemoglobin 15.8 13.0 - 17.0 g/dL   HCT 02.7 25.3 - 66.4 %   MCV 95.7 80.0 - 100.0 fL   MCH 29.8 26.0 - 34.0 pg   MCHC 31.2 30.0 - 36.0 g/dL   RDW 40.3 (H) 47.4 - 25.9 %   Platelets 232 150 - 400 K/uL   nRBC 0.0 0.0 - 0.2 %    Comment: Performed at [facility], [address] [place], [place] [address]  Basic metabolic panel     Status: Abnormal   Collection Time: 11/16/23  4:10 AM  Result Value Ref Range   Sodium 142 135 - 145 mmol/L   Potassium 3.1 (L) 3.5 - 5.1 mmol/L   Chloride 91 (L) 98 - 111 mmol/L   CO2 40 (H) 22 - 32 mmol/L   Glucose, Bld 86 70 - 99 mg/dL    Comment: Glucose reference range applies only to samples taken after fasting for at least 8 hours.   BUN 17 8 - 23 mg/dL   Creatinine, Ser 5.64 0.61 - 1.24 mg/dL   Calcium 7.8 (L) 8.9 - 10.3 mg/dL   GFR, Estimated >33 >29 mL/min    Comment: (NOTE) Calculated using the CKD-EPI Creatinine Equation (2021)    Anion gap 11 5 - 15    Comment: Performed at [facility], [address] [place], [place] [address]      RADIOGRAPHY: DG Chest 2 View Result Date: 11/16/2023 CLINICAL DATA:  Shortness of breath EXAM: CHEST - 2 VIEW COMPARISON:  11/14/2023 FINDINGS: Cardiac shadow remains enlarged. Left chest port is noted. Previously seen interstitial and alveolar edema has improved when compared with the prior exam. Patchy consolidative changes are noted in the bases bilaterally right slightly worse than left. Right-sided effusion is noted as well. IMPRESSION: Improvement in CHF over the interval from the prior exam. Basilar airspace opacities are noted right worse than left. Electronically Signed   By: Alcide Clever M.D.   On: 11/16/2023 10:12   ECHOCARDIOGRAM COMPLETE Result Date: 11/15/2023    ECHOCARDIOGRAM REPORT   Patient Name:   Larry Moore [facility] Date of Exam: 11/15/2023 Medical Rec #:  [id number]         Height:       68.0 in Accession #:    [id number]        Weight:       219.4 lb Date of Birth:  1951-09-27         BSA:           2.126 m Patient Age:    73 years  BP:           110/61 mmHg Patient Gender: M                 HR:           88 bpm. [facility] Procedure: 2D Echo, Cardiac Doppler, Color Doppler and Intracardiac            Opacification Agent Indications:    Dyspnea R06.00  History:        Patient has no prior history of Echocardiogram examinations.                 CHF, Arrythmias:Atrial Fibrillation; Risk Factors:Hypertension                 and Prediabetes. Hx of lung cancer and tobacco abuse,                 Obstructive sleep apnea syndrome.  Sonographer:    Celesta Gentile RCS Referring Phys: [id number] COURAGE EMOKPAE IMPRESSIONS  1. Left ventricular ejection fraction, by estimation, is 55 to 60%. The left ventricle has normal function. The left ventricle has no regional wall motion abnormalities. Left ventricular diastolic function could not be evaluated.  2. Right ventricular systolic function appears to be reduced, suboptimal images despite definity administration. The right ventricular size is normal. Tricuspid regurgitation signal is inadequate for assessing PA pressure.  3. Left atrial size was severely dilated.  4. Right atrial size was mildly dilated.  5. The mitral valve is normal in structure. No evidence of mitral valve regurgitation. No evidence of mitral stenosis.  6. The aortic valve was not well visualized. Aortic valve regurgitation is not visualized. No aortic stenosis is present.  7. Aortic dilatation noted. There is mild dilatation of the aortic root, measuring 42 mm.  8. The inferior vena cava is dilated in size with >50% respiratory variability, suggesting right atrial pressure of 8 mmHg. Comparison(s): No prior Echocardiogram. FINDINGS  Left Ventricle: Left ventricular ejection fraction, by estimation, is 55 to 60%. The left ventricle has normal function. The left ventricle has no regional wall motion abnormalities. The left ventricular internal cavity size was normal in size. There is   no left ventricular hypertrophy. Left ventricular diastolic function could not be evaluated due to atrial fibrillation. Left ventricular diastolic function could not be evaluated. Right Ventricle: The right ventricular size is normal. No increase in right ventricular wall thickness. Right ventricular systolic function appears to be reduced, suboptimal images despite definity administration. Tricuspid regurgitation signal is inadequate for assessing PA pressure. Left Atrium: Left atrial size was severely dilated. Right Atrium: Right atrial size was mildly dilated. Pericardium: There is no evidence of pericardial effusion. Mitral Valve: The mitral valve is normal in structure. No evidence of mitral valve regurgitation. No evidence of mitral valve stenosis. Tricuspid Valve: The tricuspid valve is grossly normal. Tricuspid valve regurgitation is not demonstrated. No evidence of tricuspid stenosis. Aortic Valve: The aortic valve was not well visualized. Aortic valve regurgitation is not visualized. No aortic stenosis is present. Pulmonic Valve: The pulmonic valve was not well visualized. Pulmonic valve regurgitation is not visualized. No evidence of pulmonic stenosis. Aorta: Aortic dilatation noted. There is mild dilatation of the aortic root, measuring 42 mm. Venous: The inferior vena cava is dilated in size with greater than 50% respiratory variability, suggesting right atrial pressure of 8 mmHg. IAS/Shunts: No atrial level shunt detected by color flow Doppler.  LEFT VENTRICLE PLAX 2D LVIDd:  5.10 cm   Diastology LVIDs:         3.50 cm   LV e' medial:    6.09 cm/s LV PW:         1.00 cm   LV E/e' medial:  14.5 LV IVS:        1.00 cm   LV e' lateral:   8.16 cm/s LVOT diam:     2.20 cm   LV E/e' lateral: 10.9 LV SV:         60 LV SV Index:   28 LVOT Area:     3.80 cm  RIGHT VENTRICLE RV S prime:     12.40 cm/s TAPSE (M-mode): 1.7 cm LEFT ATRIUM            Index        RIGHT ATRIUM           Index LA diam:       4.70 cm  2.21 cm/m   RA Area:     21.70 cm LA Vol (A2C): 93.1 ml  43.80 ml/m  RA Volume:   66.90 ml  31.47 ml/m LA Vol (A4C): 136.0 ml 63.98 ml/m  AORTIC VALVE LVOT Vmax:   86.50 cm/s LVOT Vmean:  59.800 cm/s LVOT VTI:    0.159 m  AORTA Ao Root diam: 4.20 cm MITRAL VALVE MV Area (PHT): 3.27 cm    SHUNTS MV Decel Time: 232 msec    Systemic VTI:  0.16 m MV E velocity: 88.60 cm/s  Systemic Diam: 2.20 cm MV A velocity: 40.70 cm/s MV E/A ratio:  2.18 Vishnu Priya Mallipeddi Electronically signed by Winfield Rast Mallipeddi Signature Date/Time: 11/15/2023/1:16:44 PM    Final          ASSESSMENT and PLAN:  1.  Extensive stage small cell lung cancer: - Last atezolizumab on 09/01/2023 held due to pneumonitis. - CT CAP (09/26/2021): New moderate right pleural effusion and increased consolidation in the right lung and parts of the left lung.  No adenopathy or metastatic disease.  2.  Acute on chronic hypoxic/hypercapnic respiratory failure: - CHF/COPD/pneumonitis.  He has not been taking Lasix daily which was started by his cardiologist more than 10 years ago. - Echocardiogram 11/15/2023: EF 55 to 60%.  Diastolic parameters could not be evaluated. - He is on IV Lasix with improvement in leg swellings and weight.  Improvement in breathing. - He is currently receiving Solu-Medrol 40 mg twice daily. - Recommend discharging home on prednisone 60 mg daily. - He will have follow-up CT scan of the chest next week and see me after that. - Dr. Marisa Severin is arranging ultrasound with possible thoracentesis of the right side.  Please send for cytology. - Discussed with Dr. Marisa Severin.  All questions were answered. The patient knows to call the clinic with any problems, questions or concerns. We can certainly see the patient much sooner if necessary.    Doreatha Massed

## 2023-11-17 ENCOUNTER — Inpatient Hospital Stay (HOSPITAL_COMMUNITY): Payer: PPO

## 2023-11-17 ENCOUNTER — Encounter (HOSPITAL_COMMUNITY): Payer: Self-pay | Admitting: Family Medicine

## 2023-11-17 ENCOUNTER — Other Ambulatory Visit (HOSPITAL_COMMUNITY): Payer: PPO

## 2023-11-17 DIAGNOSIS — J441 Chronic obstructive pulmonary disease with (acute) exacerbation: Secondary | ICD-10-CM

## 2023-11-17 DIAGNOSIS — Z720 Tobacco use: Secondary | ICD-10-CM | POA: Diagnosis not present

## 2023-11-17 DIAGNOSIS — J9601 Acute respiratory failure with hypoxia: Secondary | ICD-10-CM | POA: Diagnosis not present

## 2023-11-17 DIAGNOSIS — C3411 Malignant neoplasm of upper lobe, right bronchus or lung: Secondary | ICD-10-CM | POA: Diagnosis not present

## 2023-11-17 DIAGNOSIS — I5033 Acute on chronic diastolic (congestive) heart failure: Secondary | ICD-10-CM | POA: Diagnosis not present

## 2023-11-17 LAB — BODY FLUID CELL COUNT WITH DIFFERENTIAL
Lymphs, Fluid: 23 %
Monocyte-Macrophage-Serous Fluid: 33 % — ABNORMAL LOW (ref 50–90)
Neutrophil Count, Fluid: 44 % — ABNORMAL HIGH (ref 0–25)
Total Nucleated Cell Count, Fluid: 670 uL (ref 0–1000)

## 2023-11-17 LAB — CBC
HCT: 54.5 % — ABNORMAL HIGH (ref 39.0–52.0)
Hemoglobin: 17.1 g/dL — ABNORMAL HIGH (ref 13.0–17.0)
MCH: 30.1 pg (ref 26.0–34.0)
MCHC: 31.4 g/dL (ref 30.0–36.0)
MCV: 96 fL (ref 80.0–100.0)
Platelets: 245 10*3/uL (ref 150–400)
RBC: 5.68 MIL/uL (ref 4.22–5.81)
RDW: 18.7 % — ABNORMAL HIGH (ref 11.5–15.5)
WBC: 9.2 10*3/uL (ref 4.0–10.5)
nRBC: 0 % (ref 0.0–0.2)

## 2023-11-17 LAB — RENAL FUNCTION PANEL
Albumin: 3.5 g/dL (ref 3.5–5.0)
Anion gap: 11 (ref 5–15)
BUN: 24 mg/dL — ABNORMAL HIGH (ref 8–23)
CO2: 41 mmol/L — ABNORMAL HIGH (ref 22–32)
Calcium: 8.1 mg/dL — ABNORMAL LOW (ref 8.9–10.3)
Chloride: 88 mmol/L — ABNORMAL LOW (ref 98–111)
Creatinine, Ser: 0.9 mg/dL (ref 0.61–1.24)
GFR, Estimated: >60 mL/min (ref >60)
Glucose, Bld: 136 mg/dL — ABNORMAL HIGH (ref 70–99)
Phosphorus: 3.9 mg/dL (ref 2.5–4.6)
Potassium: 4.1 mmol/L (ref 3.5–5.1)
Sodium: 140 mmol/L (ref 135–145)

## 2023-11-17 LAB — LACTATE DEHYDROGENASE, PLEURAL OR PERITONEAL FLUID: LD, Fluid: 97 U/L — ABNORMAL HIGH (ref 3–23)

## 2023-11-17 LAB — PROTEIN, PLEURAL OR PERITONEAL FLUID: Total protein, fluid: 3.5 g/dL

## 2023-11-17 LAB — LACTATE DEHYDROGENASE: LDH: 110 U/L (ref 98–192)

## 2023-11-17 MED ORDER — LIDOCAINE HCL (PF) 2 % IJ SOLN
INTRAMUSCULAR | Status: AC
  Filled 2023-11-17: qty 10

## 2023-11-17 MED ORDER — PANTOPRAZOLE SODIUM 40 MG PO TBEC: 40.0000 mg | DELAYED_RELEASE_TABLET | Freq: Every day | ORAL | 1 refills | Status: AC

## 2023-11-17 MED ORDER — PREDNISONE 20 MG PO TABS: 60.0000 mg | ORAL_TABLET | Freq: Every day | ORAL | 0 refills | Status: DC

## 2023-11-17 MED ORDER — NICOTINE 21 MG/24HR TD PT24: 21.0000 mg | MEDICATED_PATCH | Freq: Every day | TRANSDERMAL | 0 refills | Status: AC

## 2023-11-17 MED ORDER — ACETAMINOPHEN 500 MG PO TABS
1000.0000 mg | ORAL_TABLET | Freq: Three times a day (TID) | ORAL | Status: AC | PRN
Start: 2023-11-17 — End: ?

## 2023-11-17 MED ORDER — HEPARIN SOD (PORK) LOCK FLUSH 100 UNIT/ML IV SOLN
500.0000 [IU] | Freq: Once | INTRAVENOUS | Status: AC
  Administered 2023-11-17: 500 [IU] via INTRAVENOUS
  Filled 2023-11-17: qty 5

## 2023-11-17 MED ORDER — DOXYCYCLINE HYCLATE 100 MG PO TABS: 100.0000 mg | ORAL_TABLET | Freq: Two times a day (BID) | ORAL | 0 refills | Status: AC

## 2023-11-17 MED ORDER — LIDOCAINE HCL (PF) 2 % IJ SOLN
10.0000 mL | Freq: Once | INTRAMUSCULAR | Status: AC
  Administered 2023-11-17: 10 mL

## 2023-11-17 NOTE — Discharge Summary (Signed)
Physician Discharge Summary   Patient: Larry Moore MRN: [id number] DOB: November 11, 1950  Admit date:     11/14/2023  Discharge date: 11/17/23  Discharge Physician: Vassie Loll   PCP: Elfredia Nevins, MD   Recommendations at discharge:  Repeat CBC to follow hemoglobin and WBCs trend/stability Repeat basic metabolic panel to follow ultralights renal function Make sure patient follow-up with oncology service as instructed. Follow-up final results of thoracentesis fluid analysis.  Discharge Diagnoses: Principal Problem:   Acute exacerbation of CHF (congestive heart failure) (HCC) Active Problems:   Small cell lung cancer, right upper lobe (HCC)   Acute hypoxic on chronic hypercapnic respiratory failure (HCC)   Systolic dysfunction with acute on chronic heart failure (HCC)   Tobacco abuse   Paroxysmal atrial fibrillation (HCC)   Obstructive sleep apnea syndrome   Chronic diastolic heart failure (HCC)   COPD exacerbation (HCC)  Brief Hospital admission course: As per H&P written by Dr. 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 No fever  Or chills    No Nausea, Vomiting or Diarrhea   -- Additional history obtained from patient's wife and patient's daughter Larry Moore at bedside--- apparently dyspnea on exertion, worsening hypoxia and orthopnea have persisted for a while and worsening over the last couple days -In the ED on 4 L of oxygen O2 sats was 89 to 90% -Patient was noted to have increased work of breathing and was placed on BiPAP (uses CPAP at home for OSA) - In  the ED chest x-ray suggestive of moderate CHF, BNP 444 no recent baseline --- COVID, influenza and RSV negative WBC 11.4, lactic acid is 1.2 repeat 0.8 procalcitonin pending -VBG consistent with compensated respiratory acidosis -Troponin initially 12 repeat troponin 15 EKG sinus rhythm without acute ACS type findings -Creatinine 0.77, potassium 3.5 sodium 141 anion gap 12 bicarb 31   Assessment and Plan: 1)Acute on chronic Hypoxic and Hypercapnic Respiratory Failure--- suspect systolic CHF related -COPD and --H/o Small Cell Lung Ca--with 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 -Continue daily weights, low-sodium diet and maintain adequate hydration. 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 -Given the fact of high risk bronchiectasis and also at risk for infections patient will also complete oral antibiotic therapy using doxycycline. -Thoracentesis on his right lung on 1 performed on 11/17/2023 with over 800 mL removed; final fluid results will be followed.   2)HFpEF--acute on chronic Diastolic (Preserved EF) CHF -HFpEF  -Repeat Echo from 11/15/23----EF is 55 to 60 %, No mitral stenosis, No Aortic Stenosis --- Most likely responsible for #1 above Troponin initially 12 repeat troponin 15,  EKG sinus rhythm  without acute ACS type findings -BNP and CHF findings above 1 -Continue diuresis using Lasix and metolazone. -Repeat echo from 11/15/23 as above #1 11/16/23 -Euvolemic  at time of discharge.   Intake/Output Summary (Last 24 hours) at 11/16/2023 1641 Last data filed at 11/16/2023 1139    Gross per 24 hour  Intake 240 ml  Output 3650 ml  Net -3410 ml    -S/p thoracentesis with over 800 mL removed; final results will be followed; initial fluid analysis suggesting transudate most likely associated with heart failure.   3) acute COPD exacerbation--patient with ongoing tobacco use WBC 11.4, lactic acid is 1.2 repeat 0.8 procalcitonin <0.10 -VBG consistent with compensated respiratory acidosis -Continue doxycycline to complete oral antibiotic therapy. -Bronchodilators and mucolytics -Continue oral prednisone (60 mg daily until follow-up with Dr. Ellin Saba). 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 Ellin Saba at discharge please discharge on prednisone 60 mg daily until patient sees Dr. Ellin Saba in the clinic on 11/25/2023   4)Extensive stage small cell Rt Lung cancer:  - s/p radiation chemoimmunotherapy - - Possible immunotherapy induced Pneumonitis--per Dr. Ellin Saba at discharge please discharge on prednisone 60 mg daily until patient sees Dr. 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 Gary Fleet on 01/29/2020. Intra-op there were two frequent PACs which were unable to be ablated ( one from the high crista/SVC was not consistent enough for mapping and the distal PAC remained despite L sided PVI )----  Therefore post-ablation the pt remained on sotalol  -Eliquis was discontinued due to recurrent hemoptysis; continue outpatient follow-up with cardiology service. -- c/n sotalol 80 mg twice daily --Repeat echo results as mentioned above in problem #1.   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 -Patient is a full code.   7)Generalized Weakness and Deconditioning--- physical therapy eval appreciated, No further Therapy identified.   Consultants: Oncology service; IR. Procedures performed: See below for x-ray report; thoracentesis and 2D echo. Disposition: Home Diet recommendation: Heart healthy/low-sodium diet.  DISCHARGE MEDICATION: Allergies as of 11/17/2023   No Known Allergies      Medication List     STOP taking these medications    levofloxacin 500 MG tablet Commonly known as: LEVAQUIN       TAKE these medications    acetaminophen 500 MG tablet Commonly known as: TYLENOL Take 2 tablets (1,000 mg total) by mouth every 8 (eight) hours as needed for mild pain (pain score 1-3), fever or headache.   allopurinol 300 MG tablet Commonly known as: ZYLOPRIM Take 600 mg by mouth daily.   alprazolam 2 MG tablet Commonly known as: XANAX Take 1 tablet (2 mg total) by mouth at bedtime as needed for anxiety.   doxycycline 100 MG tablet Commonly known as: VIBRA-TABS Take 1 tablet (100 mg total) by mouth every 12 (twelve) hours for 5 days.   furosemide 80 MG tablet Commonly known as: LASIX Take 80 mg by mouth daily.   lidocaine-prilocaine cream Commonly known as: EMLA Apply topically as directed.   metolazone 2.5 MG tablet Commonly known as: ZAROXOLYN Take 2.5 mg by mouth daily.   multivitamin tablet Take 1 tablet by mouth daily.   nicotine 21 mg/24hr patch Commonly known as: NICODERM CQ - dosed in mg/24 hours Place 1 patch (21 mg total) onto the skin daily. Start taking  on: November 18, 2023   pantoprazole 40 MG tablet Commonly known as: Protonix Take 1 tablet (40 mg total) by mouth daily.   potassium chloride 10 MEQ tablet Commonly known as: KLOR-CON M Take 1 tablet (10 mEq total) by mouth 2 (two) times daily.   predniSONE 20 MG tablet Commonly known as: DELTASONE Take 3 tablets (60 mg total) by mouth daily with  breakfast. What changed: how much to take   sotalol 80 MG tablet Commonly known as: BETAPACE Take 80 mg by mouth 2 (two) times daily.   zolpidem 10 MG tablet Commonly known as: AMBIEN Take 10 mg by mouth at bedtime as needed for sleep.        Follow-up Information     Elfredia Nevins, MD. Schedule an appointment as soon as possible for a visit in 10 day(s).   Specialty: Internal Medicine Contact information: [address] [place] [address] [phone]         Doreatha Massed, MD. Go on 11/25/2023.   Specialty: Hematology Contact information: [address] [place] [address] [phone]                Discharge Exam: Filed Weights   11/15/23 0437 11/16/23 0500 11/17/23 0500  Weight: 99.5 kg 92.7 kg 92.8 kg   General exam: Alert, awake, oriented x 3; expressing no chest pain, no nausea, no vomiting, no palpitations and feeling ready to go home. Respiratory system: Positive scattered rhonchi; no using accessory muscles.  Improved air movement bilaterally.  3 L nasal cannula in place. Cardiovascular system: Rate controlled; no rubs, no gallops, no JVD on exam. Gastrointestinal system: Abdomen is nondistended, soft and nontender. No organomegaly or masses felt. Normal bowel sounds heard. Central nervous system:No focal neurological deficits. Extremities: No cyanosis, clubbing or edema. Skin: No petechiae. Psychiatry:Mood & affect appropriate.    Condition at discharge: Stable and improved.  The results of significant diagnostics from this hospitalization (including imaging, microbiology, ancillary and laboratory) are listed below for reference.   Imaging Studies: DG Chest 1 View Result Date: 11/17/2023 CLINICAL DATA:  Lung cancer, right effusion, thoracentesis EXAM: CHEST  1 VIEW COMPARISON:  11/16/2023 FINDINGS: Resolved right effusion following thoracentesis. No pneumothorax. Improved bibasilar aeration. Stable cardiomegaly without CHF.  Left IJ power port catheter tip SVC RA junction. IMPRESSION: Negative for pneumothorax following right thoracentesis. Electronically Signed   By: Judie Petit.  Shick M.D.   On: 11/17/2023 12:08   US THORACENTESIS ASP PLEURAL SPACE W/IMG GUIDE Result Date: 11/17/2023 INDICATION: Pleural effusion, lung cancer EXAM: ULTRASOUND GUIDED RIGHT THORACENTESIS MEDICATIONS: 1% lidocaine local COMPLICATIONS: None immediate. PROCEDURE: An ultrasound guided thoracentesis was thoroughly discussed with the patient and questions answered. The benefits, risks, alternatives and complications were also discussed. The patient understands and wishes to proceed with the procedure. Written consent was obtained. Ultrasound was performed to localize and mark an adequate pocket of fluid in the right chest. The area was then prepped and draped in the normal sterile fashion. 1% Lidocaine was used for local anesthesia. Under ultrasound guidance a 6 Fr Safe-T-Centesis catheter was introduced. Thoracentesis was performed. The catheter was removed and a dressing applied. FINDINGS: A total of approximately 800 cc of amber colored pleural fluid was removed. Samples were sent to the laboratory as requested by the clinical team. IMPRESSION: Successful ultrasound guided right thoracentesis yielding 800 cc of pleural fluid. Electronically Signed   By: Judie Petit.  Shick M.D.   On: 11/17/2023 11:53   DG Chest 2 View Result Date: 11/16/2023  CLINICAL DATA:  Shortness of breath EXAM: CHEST - 2 VIEW COMPARISON:  11/14/2023 FINDINGS: Cardiac shadow remains enlarged. Left chest port is noted. Previously seen interstitial and alveolar edema has improved when compared with the prior exam. Patchy consolidative changes are noted in the bases bilaterally right slightly worse than left. Right-sided effusion is noted as well. IMPRESSION: Improvement in CHF over the interval from the prior exam. Basilar airspace opacities are noted right worse than left. Electronically Signed   By:  Alcide Clever M.D.   On: 11/16/2023 10:12   ECHOCARDIOGRAM COMPLETE Result Date: 11/15/2023    ECHOCARDIOGRAM REPORT   Patient Name:   ONIX JUMPER [facility] Date of Exam: 11/15/2023 Medical Rec #:  [id number]         Height:       68.0 in Accession #:    [id number]        Weight:       219.4 lb Date of Birth:  1951/08/17         BSA:          2.126 m Patient Age:    72 years          BP:           110/61 mmHg Patient Gender: M                 HR:           88 bpm. [facility] Procedure: 2D Echo, Cardiac Doppler, Color Doppler and Intracardiac            Opacification Agent Indications:    Dyspnea R06.00  History:        Patient has no prior history of Echocardiogram examinations.                 CHF, Arrythmias:Atrial Fibrillation; Risk Factors:Hypertension                 and Prediabetes. Hx of lung cancer and tobacco abuse,                 Obstructive sleep apnea syndrome.  Sonographer:    Celesta Gentile RCS Referring Phys: [phone] COURAGE EMOKPAE IMPRESSIONS  1. Left ventricular ejection fraction, by estimation, is 55 to 60%. The left ventricle has normal function. The left ventricle has no regional wall motion abnormalities. Left ventricular diastolic function could not be evaluated.  2. Right ventricular systolic function appears to be reduced, suboptimal images despite definity administration. The right ventricular size is normal. Tricuspid regurgitation signal is inadequate for assessing PA pressure.  3. Left atrial size was severely dilated.  4. Right atrial size was mildly dilated.  5. The mitral valve is normal in structure. No evidence of mitral valve regurgitation. No evidence of mitral stenosis.  6. The aortic valve was not well visualized. Aortic valve regurgitation is not visualized. No aortic stenosis is present.  7. Aortic dilatation noted. There is mild dilatation of the aortic root, measuring 42 mm.  8. The inferior vena cava is dilated in size with >50% respiratory variability,  suggesting right atrial pressure of 8 mmHg. Comparison(s): No prior Echocardiogram. FINDINGS  Left Ventricle: Left ventricular ejection fraction, by estimation, is 55 to 60%. The left ventricle has normal function. The left ventricle has no regional wall motion abnormalities. The left ventricular internal cavity size was normal in size. There is  no left ventricular hypertrophy. Left ventricular diastolic function could not be evaluated due to atrial fibrillation. Left  ventricular diastolic function could not be evaluated. Right Ventricle: The right ventricular size is normal. No increase in right ventricular wall thickness. Right ventricular systolic function appears to be reduced, suboptimal images despite definity administration. Tricuspid regurgitation signal is inadequate for assessing PA pressure. Left Atrium: Left atrial size was severely dilated. Right Atrium: Right atrial size was mildly dilated. Pericardium: There is no evidence of pericardial effusion. Mitral Valve: The mitral valve is normal in structure. No evidence of mitral valve regurgitation. No evidence of mitral valve stenosis. Tricuspid Valve: The tricuspid valve is grossly normal. Tricuspid valve regurgitation is not demonstrated. No evidence of tricuspid stenosis. Aortic Valve: The aortic valve was not well visualized. Aortic valve regurgitation is not visualized. No aortic stenosis is present. Pulmonic Valve: The pulmonic valve was not well visualized. Pulmonic valve regurgitation is not visualized. No evidence of pulmonic stenosis. Aorta: Aortic dilatation noted. There is mild dilatation of the aortic root, measuring 42 mm. Venous: The inferior vena cava is dilated in size with greater than 50% respiratory variability, suggesting right atrial pressure of 8 mmHg. IAS/Shunts: No atrial level shunt detected by color flow Doppler.  LEFT VENTRICLE PLAX 2D LVIDd:         5.10 cm   Diastology LVIDs:         3.50 cm   LV e' medial:    6.09 cm/s LV  PW:         1.00 cm   LV E/e' medial:  14.5 LV IVS:        1.00 cm   LV e' lateral:   8.16 cm/s LVOT diam:     2.20 cm   LV E/e' lateral: 10.9 LV SV:         60 LV SV Index:   28 LVOT Area:     3.80 cm  RIGHT VENTRICLE RV S prime:     12.40 cm/s TAPSE (M-mode): 1.7 cm LEFT ATRIUM            Index        RIGHT ATRIUM           Index LA diam:      4.70 cm  2.21 cm/m   RA Area:     21.70 cm LA Vol (A2C): 93.1 ml  43.80 ml/m  RA Volume:   66.90 ml  31.47 ml/m LA Vol (A4C): 136.0 ml 63.98 ml/m  AORTIC VALVE LVOT Vmax:   86.50 cm/s LVOT Vmean:  59.800 cm/s LVOT VTI:    0.159 m  AORTA Ao Root diam: 4.20 cm MITRAL VALVE MV Area (PHT): 3.27 cm    SHUNTS MV Decel Time: 232 msec    Systemic VTI:  0.16 m MV E velocity: 88.60 cm/s  Systemic Diam: 2.20 cm MV A velocity: 40.70 cm/s MV E/A ratio:  2.18 Vishnu Priya Mallipeddi Electronically signed by Winfield Rast Mallipeddi Signature Date/Time: 11/15/2023/1:16:44 PM    Final    DG Chest Port 1 View Result Date: 11/14/2023 CLINICAL DATA:  Worsening shortness of breath. History of lung cancer. EXAM: PORTABLE CHEST 1 VIEW COMPARISON:  CT chest dated September 27, 2023. Chest x-ray dated January 19, 2022. FINDINGS: Patient is rotated to the right. Left chest wall port catheter. Unchanged cardiomegaly. Diffuse interstitial and hazy airspace opacities throughout both lungs moderate right and small left pleural effusions. No pneumothorax. No acute osseous abnormality. IMPRESSION: 1. Moderate congestive heart failure. Electronically Signed   By: Obie Dredge M.D.   On: 11/14/2023 11:31  Microbiology: Results for orders placed or performed during the hospital encounter of 11/14/23  Resp panel by RT-PCR (RSV, Flu A&B, Covid) Anterior Nasal Swab     Status: None   Collection Time: 11/14/23 10:23 AM   Specimen: Anterior Nasal Swab  Result Value Ref Range Status   SARS Coronavirus 2 by RT PCR NEGATIVE NEGATIVE Final    Comment: (NOTE) SARS-CoV-2 target nucleic acids are NOT  DETECTED.  The SARS-CoV-2 RNA is generally detectable in upper respiratory specimens during the acute phase of infection. The lowest concentration of SARS-CoV-2 viral copies this assay can detect is 138 copies/mL. A negative result does not preclude SARS-Cov-2 infection and should not be used as the sole basis for treatment or other patient management decisions. A negative result may occur with  improper specimen collection/handling, submission of specimen other than nasopharyngeal swab, presence of viral mutation(s) within the areas targeted by this assay, and inadequate number of viral copies(<138 copies/mL). A negative result must be combined with clinical observations, patient history, and epidemiological information. The expected result is Negative.  Fact Sheet for Patients:  BloggerCourse.com  Fact Sheet for Healthcare Providers:  SeriousBroker.it  This test is no t yet approved or cleared by the [place] FDA and  has been authorized for detection and/or diagnosis of SARS-CoV-2 by FDA under an Emergency Use Authorization (EUA). This EUA will remain  in effect (meaning this test can be used) for the duration of the COVID-19 declaration under Section 564(b)(1) of the Act, 21 U.S.C.section 360bbb-3(b)(1), unless the authorization is terminated  or revoked sooner.       Influenza A by PCR NEGATIVE NEGATIVE Final   Influenza B by PCR NEGATIVE NEGATIVE Final    Comment: (NOTE) The Xpert Xpress SARS-CoV-2/FLU/RSV plus assay is intended as an aid in the diagnosis of influenza from Nasopharyngeal swab specimens and should not be used as a sole basis for treatment. Nasal washings and aspirates are unacceptable for Xpert Xpress SARS-CoV-2/FLU/RSV testing.  Fact Sheet for Patients: BloggerCourse.com  Fact Sheet for Healthcare Providers: SeriousBroker.it  This test is not yet  approved or cleared by the [place] FDA and has been authorized for detection and/or diagnosis of SARS-CoV-2 by FDA under an Emergency Use Authorization (EUA). This EUA will remain in effect (meaning this test can be used) for the duration of the COVID-19 declaration under Section 564(b)(1) of the Act, 21 U.S.C. section 360bbb-3(b)(1), unless the authorization is terminated or revoked.     Resp Syncytial Virus by PCR NEGATIVE NEGATIVE Final    Comment: (NOTE) Fact Sheet for Patients: BloggerCourse.com  Fact Sheet for Healthcare Providers: SeriousBroker.it  This test is not yet approved or cleared by the [place] FDA and has been authorized for detection and/or diagnosis of SARS-CoV-2 by FDA under an Emergency Use Authorization (EUA). This EUA will remain in effect (meaning this test can be used) for the duration of the COVID-19 declaration under Section 564(b)(1) of the Act, 21 U.S.C. section 360bbb-3(b)(1), unless the authorization is terminated or revoked.  Performed at [facility], [address] [place], [place] [address]   Culture, blood (routine x 2)     Status: None (Preliminary result)   Collection Time: 11/14/23 11:08 AM   Specimen: BLOOD  Result Value Ref Range Status   Specimen Description BLOOD LEFT ANTECUBITAL  Final   Special Requests   Final    BOTTLES DRAWN AEROBIC AND ANAEROBIC Blood Culture adequate volume   Culture   Final    NO  GROWTH 3 DAYS Performed at [facility], [address], [place], [place] [address]    Report Status PENDING  Incomplete  Culture, blood (routine x 2)     Status: None (Preliminary result)   Collection Time: 11/14/23 11:08 AM   Specimen: BLOOD  Result Value Ref Range Status   Specimen Description BLOOD RIGHT ANTECUBITAL  Final   Special Requests   Final    BOTTLES DRAWN AEROBIC AND ANAEROBIC Blood Culture adequate volume   Culture   Final    NO GROWTH 3 DAYS Performed  at [facility], [address], [place], [place] [address]    Report Status PENDING  Incomplete  MRSA Next Gen by PCR, Nasal     Status: None   Collection Time: 11/14/23  2:27 PM   Specimen: Nasal Mucosa; Nasal Swab  Result Value Ref Range Status   MRSA by PCR Next Gen NOT DETECTED NOT DETECTED Final    Comment: (NOTE) The GeneXpert MRSA Assay (FDA approved for NASAL specimens only), is one component of a comprehensive MRSA colonization surveillance program. It is not intended to diagnose MRSA infection nor to guide or monitor treatment for MRSA infections. Test performance is not FDA approved in patients less than 53 years old. Performed at [facility], [address], [place], [place] [address]     Labs: CBC: Recent Labs  Lab 11/14/23 1108 11/15/23 0336 11/17/23 0430  WBC 11.4* 8.0 9.2  NEUTROABS 10.0*  --   --   HGB 17.3* 15.8 17.1*  HCT 56.0* 50.7 54.5*  MCV 96.9 95.7 96.0  PLT 248 232 245   Basic Metabolic Panel: Recent Labs  Lab 11/14/23 1108 11/15/23 0336 11/16/23 0410 11/17/23 0430  NA 141 141 142 140  K 3.5 3.3* 3.1* 4.1  CL 98 93* 91* 88*  CO2 31 39* 40* 41*  GLUCOSE 112* 134* 86 136*  BUN 12 14 17  24*  CREATININE 0.77 0.78 0.88 0.90  CALCIUM 8.5* 7.9* 7.8* 8.1*  MG 1.9  --   --   --   PHOS  --   --   --  3.9   Liver Function Tests: Recent Labs  Lab 11/17/23 0430  ALBUMIN 3.5   CBG: No results for input(s): "GLUCAP" in the last 168 hours.  Discharge time spent: greater than 30 minutes.  Signed: Vassie Loll, MD [facility] 11/17/2023

## 2023-11-17 NOTE — Progress Notes (Signed)
Patient tolerated right sided Thoracentesis procedure well today and 800 mL of amber colored fluid removed and sent to lab for processing. Patient verbalized understanding of post procedure instructions and transported via stretcher to xray at this time for post chest xray with no acute distress noted.

## 2023-11-17 NOTE — Care Management Important Message (Signed)
Important Message  Patient Details  Name: Larry Moore MRN: [id number] Date of Birth: [birth year]   Important Message Given:  Yes - Medicare IM     Corey Harold 11/17/2023, 12:14 PM

## 2023-11-19 LAB — CULTURE, BLOOD (ROUTINE X 2)
Special Requests: ADEQUATE
Special Requests: ADEQUATE

## 2023-11-20 DIAGNOSIS — G4733 Obstructive sleep apnea (adult) (pediatric): Secondary | ICD-10-CM | POA: Diagnosis not present

## 2023-11-20 DIAGNOSIS — J449 Chronic obstructive pulmonary disease, unspecified: Secondary | ICD-10-CM | POA: Diagnosis not present

## 2023-11-20 DIAGNOSIS — I10 Essential (primary) hypertension: Secondary | ICD-10-CM | POA: Diagnosis not present

## 2023-11-22 ENCOUNTER — Ambulatory Visit (HOSPITAL_COMMUNITY)
Admission: RE | Admit: 2023-11-22 | Discharge: 2023-11-22 | Disposition: A | Discharge status: Home / Self Care | Payer: PPO | Source: Ambulatory Visit | Attending: Hematology | Admitting: Hematology

## 2023-11-22 ENCOUNTER — Inpatient Hospital Stay: Payer: PPO | Attending: Hematology

## 2023-11-22 DIAGNOSIS — I2721 Secondary pulmonary arterial hypertension: Secondary | ICD-10-CM | POA: Insufficient documentation

## 2023-11-22 DIAGNOSIS — D3501 Benign neoplasm of right adrenal gland: Secondary | ICD-10-CM | POA: Diagnosis not present

## 2023-11-22 DIAGNOSIS — Z8051 Family history of malignant neoplasm of kidney: Secondary | ICD-10-CM | POA: Diagnosis not present

## 2023-11-22 DIAGNOSIS — C3411 Malignant neoplasm of upper lobe, right bronchus or lung: Secondary | ICD-10-CM | POA: Insufficient documentation

## 2023-11-22 DIAGNOSIS — Z7952 Long term (current) use of systemic steroids: Secondary | ICD-10-CM | POA: Diagnosis not present

## 2023-11-22 DIAGNOSIS — D3502 Benign neoplasm of left adrenal gland: Secondary | ICD-10-CM | POA: Diagnosis not present

## 2023-11-22 DIAGNOSIS — I2510 Atherosclerotic heart disease of native coronary artery without angina pectoris: Secondary | ICD-10-CM | POA: Insufficient documentation

## 2023-11-22 DIAGNOSIS — C3490 Malignant neoplasm of unspecified part of unspecified bronchus or lung: Secondary | ICD-10-CM | POA: Diagnosis not present

## 2023-11-22 DIAGNOSIS — R590 Localized enlarged lymph nodes: Secondary | ICD-10-CM | POA: Insufficient documentation

## 2023-11-22 DIAGNOSIS — F17210 Nicotine dependence, cigarettes, uncomplicated: Secondary | ICD-10-CM | POA: Diagnosis not present

## 2023-11-22 DIAGNOSIS — I3139 Other pericardial effusion (noninflammatory): Secondary | ICD-10-CM | POA: Insufficient documentation

## 2023-11-22 DIAGNOSIS — J984 Other disorders of lung: Secondary | ICD-10-CM | POA: Insufficient documentation

## 2023-11-22 DIAGNOSIS — I700 Atherosclerosis of aorta: Secondary | ICD-10-CM | POA: Diagnosis not present

## 2023-11-22 DIAGNOSIS — E876 Hypokalemia: Secondary | ICD-10-CM | POA: Diagnosis not present

## 2023-11-22 DIAGNOSIS — D72829 Elevated white blood cell count, unspecified: Secondary | ICD-10-CM | POA: Diagnosis not present

## 2023-11-22 DIAGNOSIS — J90 Pleural effusion, not elsewhere classified: Secondary | ICD-10-CM | POA: Diagnosis not present

## 2023-11-22 LAB — CBC WITH DIFFERENTIAL/PLATELET
Abs Immature Granulocytes: 0.29 10*3/uL — ABNORMAL HIGH (ref 0.00–0.07)
Basophils Absolute: 0 10*3/uL (ref 0.0–0.1)
Basophils Relative: 0 %
Eosinophils Absolute: 0.1 10*3/uL (ref 0.0–0.5)
Eosinophils Relative: 1 %
HCT: 55.5 % — ABNORMAL HIGH (ref 39.0–52.0)
Hemoglobin: 17.1 g/dL — ABNORMAL HIGH (ref 13.0–17.0)
Immature Granulocytes: 2 %
Lymphocytes Relative: 9 %
Lymphs Abs: 1.2 10*3/uL (ref 0.7–4.0)
MCH: 29.2 pg (ref 26.0–34.0)
MCHC: 30.8 g/dL (ref 30.0–36.0)
MCV: 94.7 fL (ref 80.0–100.0)
Monocytes Absolute: 1 10*3/uL (ref 0.1–1.0)
Monocytes Relative: 7 %
Neutro Abs: 10.7 10*3/uL — ABNORMAL HIGH (ref 1.7–7.7)
Neutrophils Relative %: 81 %
Platelets: 213 10*3/uL (ref 150–400)
RBC: 5.86 MIL/uL — ABNORMAL HIGH (ref 4.22–5.81)
RDW: 18.6 % — ABNORMAL HIGH (ref 11.5–15.5)
WBC: 13.3 10*3/uL — ABNORMAL HIGH (ref 4.0–10.5)
nRBC: 0 % (ref 0.0–0.2)

## 2023-11-22 LAB — COMPREHENSIVE METABOLIC PANEL
ALT: 15 U/L (ref 0–44)
AST: 13 U/L — ABNORMAL LOW (ref 15–41)
Albumin: 3.8 g/dL (ref 3.5–5.0)
Alkaline Phosphatase: 58 U/L (ref 38–126)
Anion gap: 14 (ref 5–15)
BUN: 32 mg/dL — ABNORMAL HIGH (ref 8–23)
CO2: 41 mmol/L — ABNORMAL HIGH (ref 22–32)
Calcium: 8.8 mg/dL — ABNORMAL LOW (ref 8.9–10.3)
Chloride: 83 mmol/L — ABNORMAL LOW (ref 98–111)
Creatinine, Ser: 1.07 mg/dL (ref 0.61–1.24)
GFR, Estimated: >60 mL/min (ref >60)
Glucose, Bld: 83 mg/dL (ref 70–99)
Potassium: 2.8 mmol/L — ABNORMAL LOW (ref 3.5–5.1)
Sodium: 138 mmol/L (ref 135–145)
Total Bilirubin: 1 mg/dL (ref 0.0–1.2)
Total Protein: 6.9 g/dL (ref 6.5–8.1)

## 2023-11-22 LAB — MAGNESIUM: Magnesium: 1.7 mg/dL (ref 1.7–2.4)

## 2023-11-22 LAB — CYTOLOGY - NON PAP

## 2023-11-22 MED ORDER — HEPARIN SOD (PORK) LOCK FLUSH 100 UNIT/ML IV SOLN
500.0000 [IU] | Freq: Once | INTRAVENOUS | Status: AC
  Administered 2023-11-22: 500 [IU] via INTRAVENOUS

## 2023-11-22 MED ORDER — SODIUM CHLORIDE 0.9% FLUSH
10.0000 mL | INTRAVENOUS | Status: DC | PRN
  Administered 2023-11-22: 10 mL via INTRAVENOUS

## 2023-11-22 MED ORDER — IOHEXOL 300 MG/ML  SOLN
75.0000 mL | Freq: Once | INTRAMUSCULAR | Status: AC | PRN
  Administered 2023-11-22: 75 mL via INTRAVENOUS

## 2023-11-22 NOTE — Progress Notes (Signed)
Larry Moore presented for Portacath access and flush.  Portacath located left chest wall accessed with  H 20 needle.  Good blood return present. Patient remains accessed for CT scan.  Discharged from clinic ambulatory in stable condition. Alert and oriented x 3. F/U with [facility] as scheduled.

## 2023-11-24 NOTE — Progress Notes (Signed)
K+ 2.8 - Per Dr Ellin Saba, contacted wife to advise that he take 40 meq of KDur today and resume 10 meq BID tomorrow.  Verbalized understanding.

## 2023-11-25 ENCOUNTER — Inpatient Hospital Stay: Payer: PPO | Attending: Hematology | Admitting: Hematology

## 2023-11-25 VITALS — BP 95/67 | HR 76 | Resp 16 | Wt 202.2 lb

## 2023-11-25 DIAGNOSIS — J441 Chronic obstructive pulmonary disease with (acute) exacerbation: Secondary | ICD-10-CM | POA: Diagnosis not present

## 2023-11-25 DIAGNOSIS — I5023 Acute on chronic systolic (congestive) heart failure: Secondary | ICD-10-CM | POA: Diagnosis not present

## 2023-11-25 DIAGNOSIS — C3411 Malignant neoplasm of upper lobe, right bronchus or lung: Secondary | ICD-10-CM

## 2023-11-25 NOTE — Progress Notes (Signed)
[facility] [address] [place] [address]    Clinic Day:  11/25/2023  Referring physician: Elfredia Nevins, MD  Patient Care Team: Elfredia Nevins, MD as PCP - General (Internal Medicine) Doreatha Massed, MD as Medical Oncologist (Medical Oncology) Therese Sarah, RN as Oncology Nurse Navigator (Medical Oncology)   ASSESSMENT & PLAN:   Assessment: 1. Extensive stage small cell lung cancer: - Productive cough for 3 weeks with hemoptysis.  Hemoptysis improved after Eliquis stopped 2 weeks ago.  Still has some purulent sputum.  Currently on Levaquin. - Abnormal chest x-ray on 01/19/2022 for productive cough - CT chest with contrast (01/21/2022): Right hilar mass measuring 6.5 x 5.2 cm surrounding and compressing the right upper lobe bronchus, bronchus intermedius, right middle lobe bronchus and right lower lobe bronchus centrally.  Compression of the right lower lobe pulmonary artery.  Nodular soft tissue lesion in the right lower lobe continuous with right hilar mass and could represent endobronchial spread.  Enlarged mediastinal lymph nodes but no findings of pulmonary metastatic disease.  Indeterminate bilateral adrenal gland nodules and low attenuation liver lesions, likely benign cyst. - No weight loss.  No chest pains. - PET scan: Right hilar mass involving upper lobe, middle lobe and lower lobe with satellite nodularity in the right lower lobe, hypermetabolic subcarinal and right paratracheal lymphadenopathy.  Pleural-based nodule along the right posterior costophrenic angle.  Hypermetabolic left adrenal lesion.  Small hypermetabolic lesions in the humerus bilaterally and a subcutaneous focus in the right posterior lower back. - Right lung hilar mass biopsy (01/29/2022): Small cell carcinoma -4 cycles of carboplatin, etoposide and atezolizumab from 02/23/2022 through 04/29/2022, maintenance Atezolizumab last dose on 09/01/2023.  Held due to pneumonitis.   2.  Social/family history: - Larry Moore lives at home with his wife Larry Moore.  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  Retired 8 years ago.  Smoked 1 pack/day for 50 years. - Father had renal cell carcinoma.    Plan: Extensive stage small cell lung cancer: - Last atezolizumab on 09/01/2023. - Recent hospitalization on 11/14/2023 through 11/25/2023.  Admitted with orthopnea and worsening dyspnea.  Echo showed LVEF 55-60% with diastolic parameters could not be evaluated.  It was thought to be from acute exacerbation of HFpEF. - Right thoracentesis with 800 mL fluid removed on 11/17/2023. - We reviewed cytology results which were negative for malignancy. - We reviewed CT chest from 11/22/2023: Improved pneumonitis in bilateral lungs.  Moderate pericardial effusion stable.  Trace right pleural effusion. - Labs: Normal LFTs.  CBC with elevated white count from prednisone. - Will institute prednisone taper with 40 mg weekly for 5 days, followed by 20 mg daily.  Will reevaluate him in 4 weeks. - We will permanently discontinue immunotherapy secondary to pneumonitis.  Options include lurbinectedin and tarlatamab.    2.  Pneumonitis: - Larry Moore is currently on prednisone 60 mg daily.  CT scan on 11/22/2023 showed improvement in pneumonitis.  Will decrease prednisone to 40 mg daily for 5 days followed by prednisone 20 mg daily.  3.  Hypokalemia: - Potassium is 2.8.  Larry Moore will increase potassium to 40 mill equivalents daily.  Larry Moore is also taking Lasix 80 mg daily and Zaroxolyn 2.5 mg daily.    No orders of the defined types were placed in this encounter.     [name],acting as a Neurosurgeon for Doreatha Massed, MD.,have documented all relevant documentation on the behalf of Doreatha Massed, MD,as directed by  Doreatha Massed,  MD while in the presence of Doreatha Massed, MD.  I, Doreatha Massed MD, have reviewed the above documentation for accuracy and completeness, and I agree with the  above.      Doreatha Massed, MD   2/20/20252:18 PM  CHIEF COMPLAINT:   Diagnosis: extensive stage small cell lung cancer    Cancer Staging  Small cell lung cancer, right upper lobe [facility]) Staging form: Lung, AJCC 8th Edition - Clinical stage from 01/22/2022: Stage IVB (cT4, cN2, pM1c) - Signed by Doreatha Massed, MD on 02/16/2022    Prior Therapy: 4 cycles of carboplatin, etoposide and Atezolizumab completed on 04/29/2022   Current Therapy:  Maintenance Atezolizumab every 3 weeks    HISTORY OF PRESENT ILLNESS:   Oncology History  Small cell lung cancer, right upper lobe (HCC)  01/22/2022 Initial Diagnosis   Small cell lung cancer, right upper lobe (HCC)   01/22/2022 Cancer Staging   Staging form: Lung, AJCC 8th Edition - Clinical stage from 01/22/2022: Stage IVB (cT4, cN2, pM1c) - Signed by Doreatha Massed, MD on 02/16/2022 Histopathologic type: Small cell carcinoma, NOS Stage prefix: Initial diagnosis   02/23/2022 - 05/25/2022 Chemotherapy   Patient is on Treatment Plan : LUNG SCLC Carboplatin + Etoposide + Atezolizumab Induction q21d / Atezolizumab Maintenance q21d     02/23/2022 -  Chemotherapy   Patient is on Treatment Plan : LUNG SCLC Carboplatin + Etoposide + Atezolizumab Induction q21d x 4 cycles / Atezolizumab Maintenance q21d        INTERVAL HISTORY:   Larry Moore is a 73 y.o. male presenting to clinic today for follow up of extensive stage small cell lung cancer. Larry Moore was last seen by me on 10/19/23.  Since his last visit, Larry Moore underwent CT chest on 11/22/23 that found: Evolving changes of radiation therapy in the medial right hemithorax. No evidence of disease progression. Moderate pericardial effusion, as before. Trace right pleural effusion. Bilateral adrenal adenomas.  Aortic atherosclerosis. Coronary artery calcification. Enlarged pulmonic trunk, indicative of pulmonary arterial hypertension.  Larry Moore was admitted to the hospital from 11/14/23 to 11/17/23 for  acute exacerbation of CHF with VBG consistent with compensated respiratory acidosis. Larry Moore had thoracentesis on the right lung draining over 800 mL of fluid on 11/17/23. Cytology did not identify any malignant cells. Dr. Regino Schultze was discharged with prednisone 60 mg OD and Doxycycline 100 mg q12h for 5 days.   Today, Larry Moore states that Larry Moore is doing well overall. His appetite level is at 100%. His energy level is at 75%. Larry Moore is accompanied by his wife.  Ladarian is taking 60 mg OD of Prednisone. Larry Moore forgot to take his medicine on 11/23/23, but is otherwise compliant with medication doses. Larry Moore is taking lasix as prescribed. Tacari denies having to rest when walking to the clinic today. His O2 saturation at home remains in the 90's and Larry Moore is using his BIPAP machine nightly. Larry Moore states Larry Moore has been taking 40 mg of potassium daily for the past 2 days, and would like to know if Larry Moore should continue to do so.  PAST MEDICAL HISTORY:   Past Medical History: Past Medical History:  Diagnosis Date   Dyspnea    Dysrhythmia    hx a-fib   Hypertension    Insomnia    Pre-diabetes    Sleep apnea     Surgical History: Past Surgical History:  Procedure Laterality Date   ATRIAL FIBRILLATION ABLATION     approx 2020   CARDIAC CATHETERIZATION     HERNIA REPAIR  IR IMAGING GUIDED PORT INSERTION  02/17/2022   IR VENO/JUGULAR RIGHT  02/17/2022   VIDEO BRONCHOSCOPY WITH ENDOBRONCHIAL ULTRASOUND Bilateral 01/29/2022   Procedure: VIDEO BRONCHOSCOPY WITH ENDOBRONCHIAL ULTRASOUND;  Surgeon: Josephine Igo, DO;  Location: [facility] OR;  Service: Cardiopulmonary;  Laterality: Bilateral;  w/ Guardant [id number]    Social History: Social History   Socioeconomic History   Marital status: Married    Spouse name: Not on file   Number of children: Not on file   Years of education: Not on file   Highest education level: Not on file  Occupational History   Occupation: Retired  Tobacco Use   Smoking status: Every Day    Types: Cigarettes     Passive exposure: Current   Smokeless tobacco: Not on file   Tobacco comments:    Pt smokes 1 ppd. 01/26/22  Vaping Use   Vaping status: Never Used  Substance and Sexual Activity   Alcohol use: Not Currently    Comment: 1 bottle wine a day    Drug use: No   Sexual activity: Not on file  Other Topics Concern   Not on file  Social History Narrative   Not on file   Social Drivers of Health   Financial Resource Strain: Low Risk  (12/16/2022)   Overall Financial Resource Strain (CARDIA)    Difficulty of Paying Living Expenses: Not hard at all  Food Insecurity: No Food Insecurity (11/14/2023)   Hunger Vital Sign    Worried About Running Out of Food in the Last Year: Never true    Ran Out of Food in the Last Year: Never true  Transportation Needs: No Transportation Needs (11/14/2023)   PRAPARE - Administrator, Civil Service (Medical): No    Lack of Transportation (Non-Medical): No  Physical Activity: Not on file  Stress: Not on file  Social Connections: Unknown (11/14/2023)   Social Connection and Isolation Panel [NHANES]    Frequency of Communication with Friends and Family: Patient unable to answer    Frequency of Social Gatherings with Friends and Family: Patient unable to answer    Attends Religious Services: Patient unable to answer    Active Member of Clubs or Organizations: Patient unable to answer    Attends Banker Meetings: Patient unable to answer    Marital Status: Married  Catering manager Violence: Not At Risk (11/14/2023)   Humiliation, Afraid, Rape, and Kick questionnaire    Fear of Current or Ex-Partner: No    Emotionally Abused: No    Physically Abused: No    Sexually Abused: No    Family History: Family History  Problem Relation Age of Onset   Coronary artery disease Father    Diabetes Father     Current Medications:  Current Outpatient Medications:    acetaminophen (TYLENOL) 500 MG tablet, Take 2 tablets (1,000 mg total) by  mouth every 8 (eight) hours as needed for mild pain (pain score 1-3), fever or headache., Disp: , Rfl:    allopurinol (ZYLOPRIM) 300 MG tablet, Take 600 mg by mouth daily., Disp: , Rfl:    alprazolam (XANAX) 2 MG tablet, Take 1 tablet (2 mg total) by mouth at bedtime as needed for anxiety., Disp: 30 tablet, Rfl: 3   furosemide (LASIX) 80 MG tablet, Take 80 mg by mouth daily., Disp: , Rfl:    lidocaine-prilocaine (EMLA) cream, Apply topically as directed., Disp: 30 g, Rfl: 3   metolazone (ZAROXOLYN) 2.5 MG tablet, Take 2.5 mg by  mouth daily., Disp: , Rfl:    Multiple Vitamin (MULTIVITAMIN) tablet, Take 1 tablet by mouth daily., Disp: , Rfl:    nicotine (NICODERM CQ - DOSED IN MG/24 HOURS) 21 mg/24hr patch, Place 1 patch (21 mg total) onto the skin daily., Disp: 28 patch, Rfl: 0   pantoprazole (PROTONIX) 40 MG tablet, Take 1 tablet (40 mg total) by mouth daily., Disp: 30 tablet, Rfl: 1   potassium chloride (KLOR-CON M) 10 MEQ tablet, Take 1 tablet (10 mEq total) by mouth 2 (two) times daily., Disp: 180 tablet, Rfl: 3   predniSONE (DELTASONE) 20 MG tablet, Take 3 tablets (60 mg total) by mouth daily with breakfast., Disp: 90 tablet, Rfl: 0   sotalol (BETAPACE) 80 MG tablet, Take 80 mg by mouth 2 (two) times daily., Disp: , Rfl:    zolpidem (AMBIEN) 10 MG tablet, Take 10 mg by mouth at bedtime as needed for sleep., Disp: , Rfl:    Allergies: No Known Allergies  REVIEW OF SYSTEMS:   Review of Systems  Constitutional:  Negative for chills, fatigue and fever.  HENT:   Negative for lump/mass, mouth sores, nosebleeds, sore throat and trouble swallowing.   Eyes:  Negative for eye problems.  Respiratory:  Negative for cough and shortness of breath.   Cardiovascular:  Negative for chest pain, leg swelling and palpitations.  Gastrointestinal:  Negative for abdominal pain, constipation, diarrhea, nausea and vomiting.  Genitourinary:  Negative for bladder incontinence, difficulty urinating, dysuria,  frequency, hematuria and nocturia.   Musculoskeletal:  Negative for arthralgias, back pain, flank pain, myalgias and neck pain.  Skin:  Negative for itching and rash.  Neurological:  Negative for dizziness, headaches and numbness.  Hematological:  Does not bruise/bleed easily.  Psychiatric/Behavioral:  Negative for depression, sleep disturbance and suicidal ideas. The patient is not nervous/anxious.   All other systems reviewed and are negative.    VITALS:   Blood pressure 95/67, pulse 76, resp. rate 16, weight 202 lb 2.6 oz (91.7 kg), SpO2 92%.  Wt Readings from Last 3 Encounters:  11/25/23 202 lb 2.6 oz (91.7 kg)  11/17/23 204 lb 9.4 oz (92.8 kg)  10/19/23 215 lb 6.2 oz (97.7 kg)    Body mass index is 30.74 kg/m.  Performance status (ECOG): 1 - Symptomatic but completely ambulatory  PHYSICAL EXAM:   Physical Exam Vitals and nursing note reviewed. Exam conducted with a chaperone present.  Constitutional:      Appearance: Normal appearance.  Cardiovascular:     Rate and Rhythm: Normal rate and regular rhythm.     Pulses: Normal pulses.     Heart sounds: Normal heart sounds.  Pulmonary:     Effort: Pulmonary effort is normal.     Breath sounds: Normal breath sounds.  Abdominal:     Palpations: Abdomen is soft. There is no hepatomegaly, splenomegaly or mass.     Tenderness: There is no abdominal tenderness.  Musculoskeletal:     Right lower leg: No edema.     Left lower leg: No edema.  Lymphadenopathy:     Cervical: No cervical adenopathy.     Right cervical: No superficial, deep or posterior cervical adenopathy.    Left cervical: No superficial, deep or posterior cervical adenopathy.     Upper Body:     Right upper body: No supraclavicular or axillary adenopathy.     Left upper body: No supraclavicular or axillary adenopathy.  Neurological:     General: No focal deficit present.  Mental Status: Larry Moore is alert and oriented to person, place, and time.  Psychiatric:         Mood and Affect: Mood normal.        Behavior: Behavior normal.     LABS:      Latest Ref Rng & Units 11/22/2023    3:45 PM 11/17/2023    4:30 AM 11/15/2023    3:36 AM  CBC  WBC 4.0 - 10.5 K/uL 13.3  9.2  8.0   Hemoglobin 13.0 - 17.0 g/dL 40.9  81.1  91.4   Hematocrit 39.0 - 52.0 % 55.5  54.5  50.7   Platelets 150 - 400 K/uL 213  245  232       Latest Ref Rng & Units 11/22/2023    3:45 PM 11/17/2023    4:30 AM 11/16/2023    4:10 AM  CMP  Glucose 70 - 99 mg/dL 83  782  86   BUN 8 - 23 mg/dL 32  24  17   Creatinine 0.61 - 1.24 mg/dL 9.56  2.13  0.86   Sodium 135 - 145 mmol/L 138  140  142   Potassium 3.5 - 5.1 mmol/L 2.8  4.1  3.1   Chloride 98 - 111 mmol/L 83  88  91   CO2 22 - 32 mmol/L 41  41  40   Calcium 8.9 - 10.3 mg/dL 8.8  8.1  7.8   Total Protein 6.5 - 8.1 g/dL 6.9     Total Bilirubin 0.0 - 1.2 mg/dL 1.0     Alkaline Phos 38 - 126 U/L 58     AST 15 - 41 U/L 13     ALT 0 - 44 U/L 15        No results found for: "CEA1", "CEA" / No results found for: "CEA1", "CEA" No results found for: "PSA1" No results found for: "VHQ469" No results found for: "CAN125"  No results found for: "TOTALPROTELP", "ALBUMINELP", "A1GS", "A2GS", "BETS", "BETA2SER", "GAMS", "MSPIKE", "SPEI" No results found for: "TIBC", "FERRITIN", "IRONPCTSAT" Lab Results  Component Value Date   LDH 110 11/17/2023     STUDIES:   CT CHEST W CONTRAST Result Date: 11/24/2023 CLINICAL DATA:  Small cell lung cancer.  * Tracking Code: BO * EXAM: CT CHEST WITH CONTRAST TECHNIQUE: Multidetector CT imaging of the chest was performed during intravenous contrast administration. RADIATION DOSE REDUCTION: This exam was performed according to the departmental dose-optimization program which includes automated exposure control, adjustment of the mA and/or kV according to patient size and/or use of iterative reconstruction technique. CONTRAST:  75mL OMNIPAQUE IOHEXOL 300 MG/ML  SOLN COMPARISON:  09/27/2023.  FINDINGS: Cardiovascular: Left IJ Port-A-Cath terminates in the low SVC. Atherosclerotic calcification of the aorta and coronary arteries. Enlarged pulmonic trunk and heart. Moderate pericardial effusion, similar. Mediastinum/Nodes: No pathologically enlarged mediastinal, hilar or axillary lymph nodes. Esophagus is grossly unremarkable. Lungs/Pleura: Patchy parenchymal retraction, architectural distortion and bronchiectasis in the medial right hemithorax, overall much more organized than on 09/27/2023. Minimal scarring in the anteromedial left upper lobe. Lungs are otherwise clear. Trace right pleural fluid. Debris in the airway. Upper Abdomen: Probable hepatic cysts. No specific follow-up necessary. Right adrenal nodule measures 2.0 cm and 18 Hounsfield units. 1.8 cm left adrenal nodule measures -3 Hounsfield units. No specific follow-up necessary. Visualized portions of the liver, adrenal glands, kidneys, spleen, pancreas, stomach and bowel are otherwise grossly unremarkable. No upper abdominal adenopathy. Musculoskeletal: Degenerative changes in the spine. Question extramedullary hematopoiesis along the  lower thoracic spine. Old left rib fractures. IMPRESSION: 1. Evolving changes of radiation therapy in the medial right hemithorax. No evidence of disease progression. 2. Moderate pericardial effusion, as before. 3. Trace right pleural effusion. 4. Bilateral adrenal adenomas. 5. Aortic atherosclerosis (ICD10-I70.0). Coronary artery calcification. 6. Enlarged pulmonic trunk, indicative of pulmonary arterial hypertension. Electronically Signed   By: Leanna Battles M.D.   On: 11/24/2023 15:34   DG Chest 1 View Result Date: 11/17/2023 CLINICAL DATA:  Lung cancer, right effusion, thoracentesis EXAM: CHEST  1 VIEW COMPARISON:  11/16/2023 FINDINGS: Resolved right effusion following thoracentesis. No pneumothorax. Improved bibasilar aeration. Stable cardiomegaly without CHF. Left IJ power port catheter tip SVC RA  junction. IMPRESSION: Negative for pneumothorax following right thoracentesis. Electronically Signed   By: Judie Petit.  Shick M.D.   On: 11/17/2023 12:08   US THORACENTESIS ASP PLEURAL SPACE W/IMG GUIDE Result Date: 11/17/2023 INDICATION: Pleural effusion, lung cancer EXAM: ULTRASOUND GUIDED RIGHT THORACENTESIS MEDICATIONS: 1% lidocaine local COMPLICATIONS: None immediate. PROCEDURE: An ultrasound guided thoracentesis was thoroughly discussed with the patient and questions answered. The benefits, risks, alternatives and complications were also discussed. The patient understands and wishes to proceed with the procedure. Written consent was obtained. Ultrasound was performed to localize and mark an adequate pocket of fluid in the right chest. The area was then prepped and draped in the normal sterile fashion. 1% Lidocaine was used for local anesthesia. Under ultrasound guidance a 6 Fr Safe-T-Centesis catheter was introduced. Thoracentesis was performed. The catheter was removed and a dressing applied. FINDINGS: A total of approximately 800 cc of amber colored pleural fluid was removed. Samples were sent to the laboratory as requested by the clinical team. IMPRESSION: Successful ultrasound guided right thoracentesis yielding 800 cc of pleural fluid. Electronically Signed   By: Judie Petit.  Shick M.D.   On: 11/17/2023 11:53   DG Chest 2 View Result Date: 11/16/2023 CLINICAL DATA:  Shortness of breath EXAM: CHEST - 2 VIEW COMPARISON:  11/14/2023 FINDINGS: Cardiac shadow remains enlarged. Left chest port is noted. Previously seen interstitial and alveolar edema has improved when compared with the prior exam. Patchy consolidative changes are noted in the bases bilaterally right slightly worse than left. Right-sided effusion is noted as well. IMPRESSION: Improvement in CHF over the interval from the prior exam. Basilar airspace opacities are noted right worse than left. Electronically Signed   By: Alcide Clever M.D.   On: 11/16/2023  10:12   ECHOCARDIOGRAM COMPLETE Result Date: 11/15/2023    ECHOCARDIOGRAM REPORT   Patient Name:   Larry Moore [facility] Date of Exam: 11/15/2023 Medical Rec #:  [id number]         Height:       68.0 in Accession #:    [id number]        Weight:       219.4 lb Date of Birth:  1950-11-08         BSA:          2.126 m Patient Age:    72 years          BP:           110/61 mmHg Patient Gender: M                 HR:           88 bpm. [facility] Procedure: 2D Echo, Cardiac Doppler, Color Doppler and Intracardiac            Opacification Agent Indications:  Dyspnea R06.00  History:        Patient has no prior history of Echocardiogram examinations.                 CHF, Arrythmias:Atrial Fibrillation; Risk Factors:Hypertension                 and Prediabetes. Hx of lung cancer and tobacco abuse,                 Obstructive sleep apnea syndrome.  Sonographer:    Celesta Gentile RCS Referring Phys: [phone] COURAGE EMOKPAE IMPRESSIONS  1. Left ventricular ejection fraction, by estimation, is 55 to 60%. The left ventricle has normal function. The left ventricle has no regional wall motion abnormalities. Left ventricular diastolic function could not be evaluated.  2. Right ventricular systolic function appears to be reduced, suboptimal images despite definity administration. The right ventricular size is normal. Tricuspid regurgitation signal is inadequate for assessing PA pressure.  3. Left atrial size was severely dilated.  4. Right atrial size was mildly dilated.  5. The mitral valve is normal in structure. No evidence of mitral valve regurgitation. No evidence of mitral stenosis.  6. The aortic valve was not well visualized. Aortic valve regurgitation is not visualized. No aortic stenosis is present.  7. Aortic dilatation noted. There is mild dilatation of the aortic root, measuring 42 mm.  8. The inferior vena cava is dilated in size with >50% respiratory variability, suggesting right atrial pressure of 8 mmHg.  Comparison(s): No prior Echocardiogram. FINDINGS  Left Ventricle: Left ventricular ejection fraction, by estimation, is 55 to 60%. The left ventricle has normal function. The left ventricle has no regional wall motion abnormalities. The left ventricular internal cavity size was normal in size. There is  no left ventricular hypertrophy. Left ventricular diastolic function could not be evaluated due to atrial fibrillation. Left ventricular diastolic function could not be evaluated. Right Ventricle: The right ventricular size is normal. No increase in right ventricular wall thickness. Right ventricular systolic function appears to be reduced, suboptimal images despite definity administration. Tricuspid regurgitation signal is inadequate for assessing PA pressure. Left Atrium: Left atrial size was severely dilated. Right Atrium: Right atrial size was mildly dilated. Pericardium: There is no evidence of pericardial effusion. Mitral Valve: The mitral valve is normal in structure. No evidence of mitral valve regurgitation. No evidence of mitral valve stenosis. Tricuspid Valve: The tricuspid valve is grossly normal. Tricuspid valve regurgitation is not demonstrated. No evidence of tricuspid stenosis. Aortic Valve: The aortic valve was not well visualized. Aortic valve regurgitation is not visualized. No aortic stenosis is present. Pulmonic Valve: The pulmonic valve was not well visualized. Pulmonic valve regurgitation is not visualized. No evidence of pulmonic stenosis. Aorta: Aortic dilatation noted. There is mild dilatation of the aortic root, measuring 42 mm. Venous: The inferior vena cava is dilated in size with greater than 50% respiratory variability, suggesting right atrial pressure of 8 mmHg. IAS/Shunts: No atrial level shunt detected by color flow Doppler.  LEFT VENTRICLE PLAX 2D LVIDd:         5.10 cm   Diastology LVIDs:         3.50 cm   LV e' medial:    6.09 cm/s LV PW:         1.00 cm   LV E/e' medial:  14.5 LV  IVS:        1.00 cm   LV e' lateral:   8.16 cm/s LVOT diam:  2.20 cm   LV E/e' lateral: 10.9 LV SV:         60 LV SV Index:   28 LVOT Area:     3.80 cm  RIGHT VENTRICLE RV S prime:     12.40 cm/s TAPSE (M-mode): 1.7 cm LEFT ATRIUM            Index        RIGHT ATRIUM           Index LA diam:      4.70 cm  2.21 cm/m   RA Area:     21.70 cm LA Vol (A2C): 93.1 ml  43.80 ml/m  RA Volume:   66.90 ml  31.47 ml/m LA Vol (A4C): 136.0 ml 63.98 ml/m  AORTIC VALVE LVOT Vmax:   86.50 cm/s LVOT Vmean:  59.800 cm/s LVOT VTI:    0.159 m  AORTA Ao Root diam: 4.20 cm MITRAL VALVE MV Area (PHT): 3.27 cm    SHUNTS MV Decel Time: 232 msec    Systemic VTI:  0.16 m MV E velocity: 88.60 cm/s  Systemic Diam: 2.20 cm MV A velocity: 40.70 cm/s MV E/A ratio:  2.18 Vishnu Priya Mallipeddi Electronically signed by Winfield Rast Mallipeddi Signature Date/Time: 11/15/2023/1:16:44 PM    Final    DG Chest Port 1 View Result Date: 11/14/2023 CLINICAL DATA:  Worsening shortness of breath. History of lung cancer. EXAM: PORTABLE CHEST 1 VIEW COMPARISON:  CT chest dated September 27, 2023. Chest x-ray dated January 19, 2022. FINDINGS: Patient is rotated to the right. Left chest wall port catheter. Unchanged cardiomegaly. Diffuse interstitial and hazy airspace opacities throughout both lungs moderate right and small left pleural effusions. No pneumothorax. No acute osseous abnormality. IMPRESSION: 1. Moderate congestive heart failure. Electronically Signed   By: Obie Dredge M.D.   On: 11/14/2023 11:31

## 2023-11-25 NOTE — Patient Instructions (Addendum)
[facility] at [facility] Discharge Instructions   You were seen and examined today by Dr. Ellin Saba.  He reviewed the results of your lab work which are mostly normal/stable. Your potassium was 2.8. Take potassium 40 mEq daily.   He reviewed the results of your CT scan which shows improvement from the previous scan.   Take prednisone 40 mg for 5 days. Then take 20 mg daily.   We will see you back in .   Return as scheduled.    Thank you for choosing [facility] at [facility] to provide your oncology and hematology care.  To afford each patient quality time with our provider, please arrive at least 15 minutes before your scheduled appointment time.   If you have a lab appointment with the Cancer Center please come in thru the Main Entrance and check in at the main information desk.  You need to re-schedule your appointment should you arrive 10 or more minutes late.  We strive to give you quality time with our providers, and arriving late affects you and other patients whose appointments are after yours.  Also, if you no show three or more times for appointments you may be dismissed from the clinic at the providers discretion.     Again, thank you for choosing [facility].  Our hope is that these requests will decrease the amount of time that you wait before being seen by our physicians.       _____________________________________________________________  Should you have questions after your visit to [facility], please contact our office at [phone] and follow the prompts.  Our office hours are 8:00 a.m. and 4:30 p.m. Monday - Friday.  Please note that voicemails left after 4:00 p.m. may not be returned until the following business day.  We are closed weekends and major holidays.  You do have access to a nurse 24-7, just call the main number to the clinic [phone] and do not press any options, hold on the line  and a nurse will answer the phone.    For prescription refill requests, have your pharmacy contact our office and allow 72 hours.    Due to Covid, you will need to wear a mask upon entering the hospital. If you do not have a mask, a mask will be given to you at the Main Entrance upon arrival. For doctor visits, patients may have 1 support person age 48 or older with them. For treatment visits, patients can not have anyone with them due to social distancing guidelines and our immunocompromised population.

## 2023-11-26 ENCOUNTER — Other Ambulatory Visit: Payer: Self-pay

## 2023-11-28 ENCOUNTER — Other Ambulatory Visit: Payer: Self-pay | Admitting: Hematology

## 2023-11-30 ENCOUNTER — Encounter: Payer: Self-pay | Admitting: Hematology

## 2023-11-30 ENCOUNTER — Encounter (HOSPITAL_COMMUNITY): Payer: Self-pay | Admitting: Hematology

## 2023-12-03 DIAGNOSIS — I10 Essential (primary) hypertension: Secondary | ICD-10-CM | POA: Diagnosis not present

## 2023-12-03 DIAGNOSIS — G4733 Obstructive sleep apnea (adult) (pediatric): Secondary | ICD-10-CM | POA: Diagnosis not present

## 2023-12-03 DIAGNOSIS — J449 Chronic obstructive pulmonary disease, unspecified: Secondary | ICD-10-CM | POA: Diagnosis not present

## 2023-12-22 DIAGNOSIS — M79674 Pain in right toe(s): Secondary | ICD-10-CM | POA: Diagnosis not present

## 2023-12-23 ENCOUNTER — Inpatient Hospital Stay: Payer: PPO | Admitting: Hematology

## 2023-12-23 ENCOUNTER — Inpatient Hospital Stay: Payer: PPO | Attending: Hematology

## 2023-12-23 ENCOUNTER — Other Ambulatory Visit: Payer: Self-pay | Admitting: *Deleted

## 2023-12-23 VITALS — BP 80/62 | HR 86 | Temp 97.6°F | Resp 16 | Wt 214.1 lb

## 2023-12-23 DIAGNOSIS — Z7952 Long term (current) use of systemic steroids: Secondary | ICD-10-CM | POA: Insufficient documentation

## 2023-12-23 DIAGNOSIS — J984 Other disorders of lung: Secondary | ICD-10-CM | POA: Diagnosis not present

## 2023-12-23 DIAGNOSIS — F17210 Nicotine dependence, cigarettes, uncomplicated: Secondary | ICD-10-CM | POA: Insufficient documentation

## 2023-12-23 DIAGNOSIS — E876 Hypokalemia: Secondary | ICD-10-CM | POA: Diagnosis not present

## 2023-12-23 DIAGNOSIS — I5030 Unspecified diastolic (congestive) heart failure: Secondary | ICD-10-CM | POA: Diagnosis not present

## 2023-12-23 DIAGNOSIS — C3411 Malignant neoplasm of upper lobe, right bronchus or lung: Secondary | ICD-10-CM | POA: Diagnosis not present

## 2023-12-23 DIAGNOSIS — I3139 Other pericardial effusion (noninflammatory): Secondary | ICD-10-CM | POA: Diagnosis not present

## 2023-12-23 DIAGNOSIS — Z8051 Family history of malignant neoplasm of kidney: Secondary | ICD-10-CM | POA: Insufficient documentation

## 2023-12-23 DIAGNOSIS — Z95828 Presence of other vascular implants and grafts: Secondary | ICD-10-CM

## 2023-12-23 LAB — CBC WITH DIFFERENTIAL/PLATELET
Abs Immature Granulocytes: 0.15 10*3/uL — ABNORMAL HIGH (ref 0.00–0.07)
Basophils Absolute: 0 10*3/uL (ref 0.0–0.1)
Basophils Relative: 0 %
Eosinophils Absolute: 0.1 10*3/uL (ref 0.0–0.5)
Eosinophils Relative: 1 %
HCT: 50.8 % (ref 39.0–52.0)
Hemoglobin: 17.2 g/dL — ABNORMAL HIGH (ref 13.0–17.0)
Immature Granulocytes: 1 %
Lymphocytes Relative: 8 %
Lymphs Abs: 0.8 10*3/uL (ref 0.7–4.0)
MCH: 30.7 pg (ref 26.0–34.0)
MCHC: 33.9 g/dL (ref 30.0–36.0)
MCV: 90.7 fL (ref 80.0–100.0)
Monocytes Absolute: 0.7 10*3/uL (ref 0.1–1.0)
Monocytes Relative: 7 %
Neutro Abs: 8.8 10*3/uL — ABNORMAL HIGH (ref 1.7–7.7)
Neutrophils Relative %: 83 %
Platelets: 206 10*3/uL (ref 150–400)
RBC: 5.6 MIL/uL (ref 4.22–5.81)
RDW: 16.3 % — ABNORMAL HIGH (ref 11.5–15.5)
WBC: 10.6 10*3/uL — ABNORMAL HIGH (ref 4.0–10.5)
nRBC: 0 % (ref 0.0–0.2)

## 2023-12-23 LAB — COMPREHENSIVE METABOLIC PANEL
ALT: 13 U/L (ref 0–44)
AST: 13 U/L — ABNORMAL LOW (ref 15–41)
Albumin: 3.5 g/dL (ref 3.5–5.0)
Alkaline Phosphatase: 60 U/L (ref 38–126)
Anion gap: 15 (ref 5–15)
BUN: 34 mg/dL — ABNORMAL HIGH (ref 8–23)
CO2: 39 mmol/L — ABNORMAL HIGH (ref 22–32)
Calcium: 8.6 mg/dL — ABNORMAL LOW (ref 8.9–10.3)
Chloride: 83 mmol/L — ABNORMAL LOW (ref 98–111)
Creatinine, Ser: 1.27 mg/dL — ABNORMAL HIGH (ref 0.61–1.24)
GFR, Estimated: 60 mL/min — ABNORMAL LOW (ref >60)
Glucose, Bld: 79 mg/dL (ref 70–99)
Potassium: 2.6 mmol/L — CL (ref 3.5–5.1)
Sodium: 137 mmol/L (ref 135–145)
Total Bilirubin: 1.1 mg/dL (ref 0.0–1.2)
Total Protein: 6.9 g/dL (ref 6.5–8.1)

## 2023-12-23 LAB — MAGNESIUM: Magnesium: 1.7 mg/dL (ref 1.7–2.4)

## 2023-12-23 MED ORDER — SODIUM CHLORIDE 0.9% FLUSH
10.0000 mL | Freq: Once | INTRAVENOUS | Status: AC
Start: 2023-12-23 — End: 2023-12-23
  Administered 2023-12-23: 10 mL via INTRAVENOUS

## 2023-12-23 MED ORDER — POTASSIUM CHLORIDE ER 10 MEQ PO TBCR: 20.0000 meq | EXTENDED_RELEASE_TABLET | Freq: Three times a day (TID) | ORAL | 2 refills | Status: DC

## 2023-12-23 MED ORDER — HEPARIN SOD (PORK) LOCK FLUSH 100 UNIT/ML IV SOLN
500.0000 [IU] | Freq: Once | INTRAVENOUS | Status: AC
  Administered 2023-12-23: 500 [IU] via INTRAVENOUS

## 2023-12-23 MED ORDER — ZOLPIDEM TARTRATE 10 MG PO TABS: 10.0000 mg | ORAL_TABLET | Freq: Every evening | ORAL | 5 refills | Status: DC | PRN

## 2023-12-23 NOTE — Patient Instructions (Signed)
 [facility] [place] - A DEPT OF [facility]  Discharge Instructions: Thank you for choosing [facility] to provide your oncology and hematology care.  If you have a lab appointment with the Cancer Center - please note that after April 8th, 2024, all labs will be drawn in the cancer center.  You do not have to check in or register with the main entrance as you have in the past but will complete your check-in in the cancer center.  Wear comfortable clothing and clothing appropriate for easy access to any Portacath or PICC line.   We strive to give you quality time with your provider. You may need to reschedule your appointment if you arrive late (15 or more minutes).  Arriving late affects you and other patients whose appointments are after yours.  Also, if you miss three or more appointments without notifying the office, you may be dismissed from the clinic at the provider's discretion.      For prescription refill requests, have your pharmacy contact our office and allow 72 hours for refills to be completed.    Today you received the following port flush with lab draw, return as scheduled.   To help prevent nausea and vomiting after your treatment, we encourage you to take your nausea medication as directed.  BELOW ARE SYMPTOMS THAT SHOULD BE REPORTED IMMEDIATELY: *FEVER GREATER THAN 100.4 F (38 C) OR HIGHER *CHILLS OR SWEATING *NAUSEA AND VOMITING THAT IS NOT CONTROLLED WITH YOUR NAUSEA MEDICATION *UNUSUAL SHORTNESS OF BREATH *UNUSUAL BRUISING OR BLEEDING *URINARY PROBLEMS (pain or burning when urinating, or frequent urination) *BOWEL PROBLEMS (unusual diarrhea, constipation, pain near the anus) TENDERNESS IN MOUTH AND THROAT WITH OR WITHOUT PRESENCE OF ULCERS (sore throat, sores in mouth, or a toothache) UNUSUAL RASH, SWELLING OR PAIN  UNUSUAL VAGINAL DISCHARGE OR ITCHING   Items with * indicate a potential emergency and should be followed up as  soon as possible or go to the Emergency Department if any problems should occur.  Please show the CHEMOTHERAPY ALERT CARD or IMMUNOTHERAPY ALERT CARD at check-in to the Emergency Department and triage nurse.  Should you have questions after your visit or need to cancel or reschedule your appointment, please contact [facility] [place] - A DEPT OF [facility] [phone]  and follow the prompts.  Office hours are 8:00 a.m. to 4:30 p.m. Monday - Friday. Please note that voicemails left after 4:00 p.m. may not be returned until the following business day.  We are closed weekends and major holidays. You have access to a nurse at all times for urgent questions. Please call the main number to the clinic [phone] and follow the prompts.  For any non-urgent questions, you may also contact your provider using MyChart. We now offer e-Visits for anyone 7 and older to request care online for non-urgent symptoms. For details visit [email or web address].   Also download the MyChart app! Go to the app store, search "MyChart", open the app, select [place], and log in with your MyChart username and password.

## 2023-12-23 NOTE — Progress Notes (Signed)
 Port flushed with good blood return noted. No bruising or swelling at site. Bandaid applied and patient discharged in satisfactory condition. VVS stable with no signs or symptoms of distressed noted.

## 2023-12-23 NOTE — Progress Notes (Signed)
 [facility] [address] [place] [address]    Clinic Day:  12/23/2023  Referring physician: Elfredia Nevins, MD  Patient Care Team: Larry Nevins, MD as PCP - General (Internal Medicine) Doreatha Massed, MD as Medical Oncologist (Medical Oncology) Therese Sarah, RN as Oncology Nurse Navigator (Medical Oncology)   ASSESSMENT & PLAN:   Assessment: 1. Extensive stage small cell lung cancer: - Productive cough for 3 weeks with hemoptysis.  Hemoptysis improved after Eliquis stopped 2 weeks ago.  Still has some purulent sputum.  Currently on Levaquin. - Abnormal chest x-ray on 01/19/2022 for productive cough - CT chest with contrast (01/21/2022): Right hilar mass measuring 6.5 x 5.2 cm surrounding and compressing the right upper lobe bronchus, bronchus intermedius, right middle lobe bronchus and right lower lobe bronchus centrally.  Compression of the right lower lobe pulmonary artery.  Nodular soft tissue lesion in the right lower lobe continuous with right hilar mass and could represent endobronchial spread.  Enlarged mediastinal lymph nodes but no findings of pulmonary metastatic disease.  Indeterminate bilateral adrenal gland nodules and low attenuation liver lesions, likely benign cyst. - No weight loss.  No chest pains. - PET scan: Right hilar mass involving upper lobe, middle lobe and lower lobe with satellite nodularity in the right lower lobe, hypermetabolic subcarinal and right paratracheal lymphadenopathy.  Pleural-based nodule along the right posterior costophrenic angle.  Hypermetabolic left adrenal lesion.  Small hypermetabolic lesions in the humerus bilaterally and a subcutaneous focus in the right posterior lower back. - Right lung hilar mass biopsy (01/29/2022): Small cell carcinoma -4 cycles of carboplatin, etoposide and atezolizumab from 02/23/2022 through 04/29/2022, maintenance Atezolizumab last dose on 09/01/2023.  Held due to pneumonitis.   2.  Social/family history: - He lives at home with his wife Larry Moore.  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  Smoked 1 pack/day for 50 years. - Father had renal cell carcinoma.    Plan: Extensive stage small cell lung cancer: - Last atezolizumab on 09/01/2023. - Hospitalization on 11/14/2023 through 11/25/2023 with orthopnea and worsening dyspnea.  Echo showed LVEF 55-60% with diastolic parameters could not be evaluated.  Was thought to be from acute exacerbation of HFpEF. -Right thoracentesis on 11/17/2023, 800 mL fluid removed, cytology negative for malignancy. - We reviewed CT chest (11/21/2022): Improved pneumonitis in bilateral lungs.  Moderate pericardial effusion stable. - Labs today: Normal LFTs and CBC. - I will permanently discontinue immunotherapy secondary to pneumonitis. - His current CT scan does not show any progression.  Should he have progression options include lurbinectedin and tarlatamab. - I have recommended that he see Dr. Janann August at [facility] to discuss option of Tarlatamab step up dosing.  He will also discuss other clinical trials available. - RTC 6 weeks for follow-up with CT CAP with contrast and brain imaging.   2.  Pneumonitis: - He is currently on prednisone 20 mg daily.  It makes him very hungry. - Recommend decreasing prednisone to 10 mg daily for 2 weeks, then 5 mg daily until he sees me.  3.  Hypokalemia: - He is taking potassium 20 mill equivalents twice daily.  He is also on Lasix 40 mg daily and Zaroxolyn 2.5 mg daily.  Potassium today is 2.6. - Increase potassium to 20 mEq 3 times daily.  4.  CHF (HFpEF): - Continue Lasix 40 mg daily, metolazone 2.5 mg daily, sotalol 80 mg twice daily. - Blood pressure today is 80/62, heart  rate 86.  He is asymptomatic.    Orders Placed This Encounter  Procedures   MR Brain W Wo Contrast    Standing Status:   Future    Expiration Date:   12/22/2024    If indicated for the ordered procedure, I  authorize the administration of contrast media per Radiology protocol:   Yes    What is the patient's sedation requirement?:   No Sedation    Does the patient have a pacemaker or implanted devices?:   No    Use SRS Protocol?:   No    Preferred imaging location?:   [facility] (table limit - 550lbs)   CT CHEST ABDOMEN PELVIS W CONTRAST    Standing Status:   Future    Expected Date:   01/23/2024    Expiration Date:   12/22/2024    If indicated for the ordered procedure, I authorize the administration of contrast media per Radiology protocol:   Yes    Does the patient have a contrast media/X-ray dye allergy?:   No    Preferred imaging location?:   [facility]    If indicated for the ordered procedure, I authorize the administration of oral contrast media per Radiology protocol:   Yes      I,Helena R Teague,acting as a scribe for Doreatha Massed, MD.,have documented all relevant documentation on the behalf of Doreatha Massed, MD,as directed by  Doreatha Massed, MD while in the presence of Doreatha Massed, MD.  I, Doreatha Massed MD, have reviewed the above documentation for accuracy and completeness, and I agree with the above.      Doreatha Massed, MD   3/20/20252:26 PM  CHIEF COMPLAINT:   Diagnosis: extensive stage small cell lung cancer    Cancer Staging  Small cell lung cancer, right upper lobe [facility]) Staging form: Lung, AJCC 8th Edition - Clinical stage from 01/22/2022: Stage IVB (cT4, cN2, pM1c) - Signed by Doreatha Massed, MD on 02/16/2022    Prior Therapy: 4 cycles of carboplatin, etoposide and Atezolizumab completed on 04/29/2022   Current Therapy:  Maintenance Atezolizumab every 3 weeks    HISTORY OF PRESENT ILLNESS:   Oncology History  Small cell lung cancer, right upper lobe (HCC)  01/22/2022 Initial Diagnosis   Small cell lung cancer, right upper lobe (HCC)   01/22/2022 Cancer Staging   Staging form: Lung, AJCC 8th  Edition - Clinical stage from 01/22/2022: Stage IVB (cT4, cN2, pM1c) - Signed by Doreatha Massed, MD on 02/16/2022 Histopathologic type: Small cell carcinoma, NOS Stage prefix: Initial diagnosis   02/23/2022 - 05/25/2022 Chemotherapy   Patient is on Treatment Plan : LUNG SCLC Carboplatin + Etoposide + Atezolizumab Induction q21d / Atezolizumab Maintenance q21d     02/23/2022 -  Chemotherapy   Patient is on Treatment Plan : LUNG SCLC Carboplatin + Etoposide + Atezolizumab Induction q21d x 4 cycles / Atezolizumab Maintenance q21d        INTERVAL HISTORY:   Larry Moore is a 73 y.o. male presenting to clinic today for follow up of extensive stage small cell lung cancer. He was last seen by me on 11/25/23.  Today, he states that he is doing well overall. His appetite level is at 100%. His energy level is at 75%. He is accompanied by his wife.  His breathing is at baseline with his oxygen saturation in the 90's at room air. He denies any cough. Jhoan is taking 20 mg Prednisone daily and notes increased appetite. He denies any lightheadedness,  though he states he has not eaten recently.   Bryley states he takes 40 mg of Lasix daily due to CHF, and sotalol and zaroxolyn as prescribed. He is also taking Potassium BID.   PAST MEDICAL HISTORY:   Past Medical History: Past Medical History:  Diagnosis Date   Dyspnea    Dysrhythmia    hx a-fib   Hypertension    Insomnia    Pre-diabetes    Sleep apnea     Surgical History: Past Surgical History:  Procedure Laterality Date   ATRIAL FIBRILLATION ABLATION     approx 2020   CARDIAC CATHETERIZATION     HERNIA REPAIR     IR IMAGING GUIDED PORT INSERTION  02/17/2022   IR VENO/JUGULAR RIGHT  02/17/2022   VIDEO BRONCHOSCOPY WITH ENDOBRONCHIAL ULTRASOUND Bilateral 01/29/2022   Procedure: VIDEO BRONCHOSCOPY WITH ENDOBRONCHIAL ULTRASOUND;  Surgeon: Josephine Igo, DO;  Location: [facility] OR;  Service: Cardiopulmonary;  Laterality: Bilateral;  w/ Guardant  [id number]    Social History: Social History   Socioeconomic History   Marital status: Married    Spouse name: Not on file   Number of children: Not on file   Years of education: Not on file   Highest education level: Not on file  Occupational History   Occupation: Retired  Tobacco Use   Smoking status: Every Day    Types: Cigarettes    Passive exposure: Current   Smokeless tobacco: Not on file   Tobacco comments:    Pt smokes 1 ppd. 01/26/22  Vaping Use   Vaping status: Never Used  Substance and Sexual Activity   Alcohol use: Not Currently    Comment: 1 bottle wine a day    Drug use: No   Sexual activity: Not on file  Other Topics Concern   Not on file  Social History Narrative   Not on file   Social Drivers of Health   Financial Resource Strain: Low Risk  (12/16/2022)   Overall Financial Resource Strain (CARDIA)    Difficulty of Paying Living Expenses: Not hard at all  Food Insecurity: No Food Insecurity (11/14/2023)   Hunger Vital Sign    Worried About Running Out of Food in the Last Year: Never true    Ran Out of Food in the Last Year: Never true  Transportation Needs: No Transportation Needs (11/14/2023)   PRAPARE - Administrator, Civil Service (Medical): No    Lack of Transportation (Non-Medical): No  Physical Activity: Not on file  Stress: Not on file  Social Connections: Unknown (11/14/2023)   Social Connection and Isolation Panel [NHANES]    Frequency of Communication with Friends and Family: Patient unable to answer    Frequency of Social Gatherings with Friends and Family: Patient unable to answer    Attends Religious Services: Patient unable to answer    Active Member of Clubs or Organizations: Patient unable to answer    Attends Banker Meetings: Patient unable to answer    Marital Status: Married  Catering manager Violence: Not At Risk (11/14/2023)   Humiliation, Afraid, Rape, and Kick questionnaire    Fear of Current or Ex-Partner:  No    Emotionally Abused: No    Physically Abused: No    Sexually Abused: No    Family History: Family History  Problem Relation Age of Onset   Coronary artery disease Father    Diabetes Father     Current Medications:  Current Outpatient Medications:    acetaminophen (  TYLENOL) 500 MG tablet, Take 2 tablets (1,000 mg total) by mouth every 8 (eight) hours as needed for mild pain (pain score 1-3), fever or headache., Disp: , Rfl:    allopurinol (ZYLOPRIM) 300 MG tablet, Take 600 mg by mouth daily., Disp: , Rfl:    alprazolam (XANAX) 2 MG tablet, Take 1 tablet (2 mg total) by mouth at bedtime as needed for anxiety., Disp: 30 tablet, Rfl: 3   furosemide (LASIX) 80 MG tablet, Take 80 mg by mouth daily., Disp: , Rfl:    lidocaine-prilocaine (EMLA) cream, Apply topically as directed., Disp: 30 g, Rfl: 3   metolazone (ZAROXOLYN) 2.5 MG tablet, Take 2.5 mg by mouth daily., Disp: , Rfl:    Multiple Vitamin (MULTIVITAMIN) tablet, Take 1 tablet by mouth daily., Disp: , Rfl:    nicotine (NICODERM CQ - DOSED IN MG/24 HOURS) 21 mg/24hr patch, Place 1 patch (21 mg total) onto the skin daily., Disp: 28 patch, Rfl: 0   pantoprazole (PROTONIX) 40 MG tablet, Take 1 tablet (40 mg total) by mouth daily., Disp: 30 tablet, Rfl: 1   predniSONE (DELTASONE) 20 MG tablet, Take 3 tablets (60 mg total) by mouth daily with breakfast. (Patient taking differently: Take 20 mg by mouth daily with breakfast.), Disp: 90 tablet, Rfl: 0   sotalol (BETAPACE) 80 MG tablet, Take 80 mg by mouth 2 (two) times daily., Disp: , Rfl:    zolpidem (AMBIEN) 10 MG tablet, Take 10 mg by mouth at bedtime as needed for sleep., Disp: , Rfl:    potassium chloride (KLOR-CON) 10 MEQ tablet, Take 2 tablets (20 mEq total) by mouth 3 (three) times daily., Disp: 180 tablet, Rfl: 2   Allergies: No Known Allergies  REVIEW OF SYSTEMS:   Review of Systems  Constitutional:  Negative for chills, fatigue and fever.  HENT:   Negative for lump/mass,  mouth sores, nosebleeds, sore throat and trouble swallowing.   Eyes:  Negative for eye problems.  Respiratory:  Positive for cough. Negative for shortness of breath.   Cardiovascular:  Negative for chest pain, leg swelling and palpitations.  Gastrointestinal:  Negative for abdominal pain, constipation, diarrhea, nausea and vomiting.  Genitourinary:  Negative for bladder incontinence, difficulty urinating, dysuria, frequency, hematuria and nocturia.   Musculoskeletal:  Negative for arthralgias, back pain, flank pain, myalgias and neck pain.  Skin:  Negative for itching and rash.  Neurological:  Negative for dizziness, headaches and numbness.  Hematological:  Does not bruise/bleed easily.  Psychiatric/Behavioral:  Negative for depression, sleep disturbance and suicidal ideas. The patient is not nervous/anxious.   All other systems reviewed and are negative.    VITALS:   Blood pressure (!) 80/62, pulse 86, temperature 97.6 F (36.4 C), temperature source Tympanic, resp. rate 16, weight 214 lb 1.1 oz (97.1 kg), SpO2 93%.  Wt Readings from Last 3 Encounters:  12/23/23 214 lb 1.1 oz (97.1 kg)  11/25/23 202 lb 2.6 oz (91.7 kg)  11/17/23 204 lb 9.4 oz (92.8 kg)    Body mass index is 32.55 kg/m.  Performance status (ECOG): 1 - Symptomatic but completely ambulatory  PHYSICAL EXAM:   Physical Exam Vitals and nursing note reviewed. Exam conducted with a chaperone present.  Constitutional:      Appearance: Normal appearance.  Cardiovascular:     Rate and Rhythm: Normal rate and regular rhythm.     Pulses: Normal pulses.     Heart sounds: Normal heart sounds.  Pulmonary:     Effort: Pulmonary effort is normal.  Breath sounds: Normal breath sounds.  Abdominal:     Palpations: Abdomen is soft. There is no hepatomegaly, splenomegaly or mass.     Tenderness: There is no abdominal tenderness.  Musculoskeletal:     Right lower leg: No edema.     Left lower leg: No edema.   Lymphadenopathy:     Cervical: No cervical adenopathy.     Right cervical: No superficial, deep or posterior cervical adenopathy.    Left cervical: No superficial, deep or posterior cervical adenopathy.     Upper Body:     Right upper body: No supraclavicular or axillary adenopathy.     Left upper body: No supraclavicular or axillary adenopathy.  Neurological:     General: No focal deficit present.     Mental Status: He is alert and oriented to person, place, and time.  Psychiatric:        Mood and Affect: Mood normal.        Behavior: Behavior normal.     LABS:      Latest Ref Rng & Units 12/23/2023    1:22 PM 11/22/2023    3:45 PM 11/17/2023    4:30 AM  CBC  WBC 4.0 - 10.5 K/uL 10.6  13.3  9.2   Hemoglobin 13.0 - 17.0 g/dL 40.9  81.1  91.4   Hematocrit 39.0 - 52.0 % 50.8  55.5  54.5   Platelets 150 - 400 K/uL 206  213  245       Latest Ref Rng & Units 12/23/2023    1:22 PM 11/22/2023    3:45 PM 11/17/2023    4:30 AM  CMP  Glucose 70 - 99 mg/dL 79  83  782   BUN 8 - 23 mg/dL 34  32  24   Creatinine 0.61 - 1.24 mg/dL 9.56  2.13  0.86   Sodium 135 - 145 mmol/L 137  138  140   Potassium 3.5 - 5.1 mmol/L 2.6  2.8  4.1   Chloride 98 - 111 mmol/L 83  83  88   CO2 22 - 32 mmol/L 39  41  41   Calcium 8.9 - 10.3 mg/dL 8.6  8.8  8.1   Total Protein 6.5 - 8.1 g/dL 6.9  6.9    Total Bilirubin 0.0 - 1.2 mg/dL 1.1  1.0    Alkaline Phos 38 - 126 U/L 60  58    AST 15 - 41 U/L 13  13    ALT 0 - 44 U/L 13  15       No results found for: "CEA1", "CEA" / No results found for: "CEA1", "CEA" No results found for: "PSA1" No results found for: "VHQ469" No results found for: "CAN125"  No results found for: "TOTALPROTELP", "ALBUMINELP", "A1GS", "A2GS", "BETS", "BETA2SER", "GAMS", "MSPIKE", "SPEI" No results found for: "TIBC", "FERRITIN", "IRONPCTSAT" Lab Results  Component Value Date   LDH 110 11/17/2023     STUDIES:   No results found.

## 2023-12-23 NOTE — Patient Instructions (Addendum)
 [facility] at [facility] Discharge Instructions   You were seen and examined today by Dr. Ellin Saba.  He reviewed the results of your lab work which are normal/stable.   He reviewed the results of your CT scan. The pneumonitis has improved and there is no evidence of cancer.   You may decrease the prednisone to 10 mg daily.   We will refer you to [facility] to meet with a doctor Eloise Harman) who can administer a specific treatment when the cancer comes back. You would receive the first several infusions there at [facility] and then you can come and get them here.   We will see you back in 6 weeks. We will repeat lab work and a CT scan prior to this visit.   Return as scheduled.    Thank you for choosing [facility] at [facility] to provide your oncology and hematology care.  To afford each patient quality time with our provider, please arrive at least 15 minutes before your scheduled appointment time.   If you have a lab appointment with the Cancer Center please come in thru the Main Entrance and check in at the main information desk.  You need to re-schedule your appointment should you arrive 10 or more minutes late.  We strive to give you quality time with our providers, and arriving late affects you and other patients whose appointments are after yours.  Also, if you no show three or more times for appointments you may be dismissed from the clinic at the providers discretion.     Again, thank you for choosing [facility].  Our hope is that these requests will decrease the amount of time that you wait before being seen by our physicians.       _____________________________________________________________  Should you have questions after your visit to [facility], please contact our office at [phone] and follow the prompts.  Our office hours are 8:00 a.m. and 4:30 p.m. Monday - Friday.  Please note that voicemails left  after 4:00 p.m. may not be returned until the following business day.  We are closed weekends and major holidays.  You do have access to a nurse 24-7, just call the main number to the clinic [phone] and do not press any options, hold on the line and a nurse will answer the phone.    For prescription refill requests, have your pharmacy contact our office and allow 72 hours.    Due to Covid, you will need to wear a mask upon entering the hospital. If you do not have a mask, a mask will be given to you at the Main Entrance upon arrival. For doctor visits, patients may have 1 support person age 22 or older with them. For treatment visits, patients can not have anyone with them due to social distancing guidelines and our immunocompromised population.

## 2023-12-23 NOTE — Progress Notes (Signed)
 CRITICAL VALUE STICKER  CRITICAL VALUE: K+ 2.6  RECEIVER (on-site recipient of call): Cynda Acres, RN  DATE & TIME NOTIFIED: 12/23/23 @ 2:10 PM  MESSENGER (representative from lab): Kirstin  MD NOTIFIED: Dr. Ellin Saba  TIME OF NOTIFICATION: 2:10 PM  RESPONSE: Physician to manage in visit

## 2023-12-24 ENCOUNTER — Other Ambulatory Visit: Payer: Self-pay

## 2024-01-03 DIAGNOSIS — I10 Essential (primary) hypertension: Secondary | ICD-10-CM | POA: Diagnosis not present

## 2024-01-03 DIAGNOSIS — G4733 Obstructive sleep apnea (adult) (pediatric): Secondary | ICD-10-CM | POA: Diagnosis not present

## 2024-01-03 DIAGNOSIS — J449 Chronic obstructive pulmonary disease, unspecified: Secondary | ICD-10-CM | POA: Diagnosis not present

## 2024-01-05 ENCOUNTER — Encounter: Payer: Self-pay | Admitting: *Deleted

## 2024-01-05 DIAGNOSIS — C3401 Malignant neoplasm of right main bronchus: Secondary | ICD-10-CM | POA: Diagnosis not present

## 2024-01-05 DIAGNOSIS — C771 Secondary and unspecified malignant neoplasm of intrathoracic lymph nodes: Secondary | ICD-10-CM | POA: Diagnosis not present

## 2024-01-17 ENCOUNTER — Other Ambulatory Visit: Payer: Self-pay | Admitting: *Deleted

## 2024-01-17 DIAGNOSIS — C3401 Malignant neoplasm of right main bronchus: Secondary | ICD-10-CM | POA: Diagnosis not present

## 2024-01-17 MED ORDER — PREDNISONE 5 MG PO TABS: 5.0000 mg | ORAL_TABLET | Freq: Every day | ORAL | 3 refills | Status: DC

## 2024-01-17 MED ORDER — FUROSEMIDE 40 MG PO TABS: 40.0000 mg | ORAL_TABLET | Freq: Every day | ORAL | 3 refills | Status: DC

## 2024-01-24 ENCOUNTER — Other Ambulatory Visit: Payer: Self-pay | Admitting: *Deleted

## 2024-01-24 ENCOUNTER — Ambulatory Visit (HOSPITAL_COMMUNITY)
Admission: RE | Admit: 2024-01-24 | Discharge: 2024-01-24 | Disposition: A | Discharge status: Home / Self Care | Source: Ambulatory Visit | Attending: Hematology | Admitting: Hematology

## 2024-01-24 ENCOUNTER — Inpatient Hospital Stay: Attending: Hematology

## 2024-01-24 ENCOUNTER — Ambulatory Visit (HOSPITAL_COMMUNITY)

## 2024-01-24 DIAGNOSIS — C3411 Malignant neoplasm of upper lobe, right bronchus or lung: Secondary | ICD-10-CM

## 2024-01-24 DIAGNOSIS — S2232XD Fracture of one rib, left side, subsequent encounter for fracture with routine healing: Secondary | ICD-10-CM | POA: Diagnosis not present

## 2024-01-24 DIAGNOSIS — G319 Degenerative disease of nervous system, unspecified: Secondary | ICD-10-CM | POA: Diagnosis not present

## 2024-01-24 DIAGNOSIS — C3490 Malignant neoplasm of unspecified part of unspecified bronchus or lung: Secondary | ICD-10-CM | POA: Diagnosis not present

## 2024-01-24 LAB — CBC WITH DIFFERENTIAL/PLATELET
Abs Immature Granulocytes: 0.08 10*3/uL — ABNORMAL HIGH (ref 0.00–0.07)
Basophils Absolute: 0 10*3/uL (ref 0.0–0.1)
Basophils Relative: 0 %
Eosinophils Absolute: 0 10*3/uL (ref 0.0–0.5)
Eosinophils Relative: 0 %
HCT: 49.2 % (ref 39.0–52.0)
Hemoglobin: 16.4 g/dL (ref 13.0–17.0)
Immature Granulocytes: 1 %
Lymphocytes Relative: 13 %
Lymphs Abs: 1.1 10*3/uL (ref 0.7–4.0)
MCH: 30.7 pg (ref 26.0–34.0)
MCHC: 33.3 g/dL (ref 30.0–36.0)
MCV: 92 fL (ref 80.0–100.0)
Monocytes Absolute: 0.6 10*3/uL (ref 0.1–1.0)
Monocytes Relative: 7 %
Neutro Abs: 6.6 10*3/uL (ref 1.7–7.7)
Neutrophils Relative %: 79 %
Platelets: 226 10*3/uL (ref 150–400)
RBC: 5.35 MIL/uL (ref 4.22–5.81)
RDW: 15 % (ref 11.5–15.5)
WBC: 8.3 10*3/uL (ref 4.0–10.5)
nRBC: 0 % (ref 0.0–0.2)

## 2024-01-24 LAB — COMPREHENSIVE METABOLIC PANEL WITH GFR
ALT: 11 U/L (ref 0–44)
AST: 18 U/L (ref 15–41)
Albumin: 3.6 g/dL (ref 3.5–5.0)
Alkaline Phosphatase: 72 U/L (ref 38–126)
Anion gap: 14 (ref 5–15)
BUN: 22 mg/dL (ref 8–23)
CO2: 36 mmol/L — ABNORMAL HIGH (ref 22–32)
Calcium: 8.5 mg/dL — ABNORMAL LOW (ref 8.9–10.3)
Chloride: 86 mmol/L — ABNORMAL LOW (ref 98–111)
Creatinine, Ser: 0.85 mg/dL (ref 0.61–1.24)
GFR, Estimated: >60 mL/min (ref >60)
Glucose, Bld: 138 mg/dL — ABNORMAL HIGH (ref 70–99)
Potassium: 2.2 mmol/L — CL (ref 3.5–5.1)
Sodium: 136 mmol/L (ref 135–145)
Total Bilirubin: 1.4 mg/dL — ABNORMAL HIGH (ref 0.0–1.2)
Total Protein: 6.8 g/dL (ref 6.5–8.1)

## 2024-01-24 LAB — MAGNESIUM: Magnesium: 1.7 mg/dL (ref 1.7–2.4)

## 2024-01-24 MED ORDER — IOHEXOL 300 MG/ML  SOLN
100.0000 mL | Freq: Once | INTRAMUSCULAR | Status: AC | PRN
  Administered 2024-01-24: 100 mL via INTRAVENOUS

## 2024-01-24 MED ORDER — SODIUM CHLORIDE 0.9% FLUSH
10.0000 mL | Freq: Once | INTRAVENOUS | Status: AC
  Administered 2024-01-24: 10 mL via INTRAVENOUS

## 2024-01-24 MED ORDER — GADOBUTROL 1 MMOL/ML IV SOLN
10.0000 mL | Freq: Once | INTRAVENOUS | Status: AC | PRN
  Administered 2024-01-24: 10 mL via INTRAVENOUS

## 2024-01-24 MED ORDER — POTASSIUM CHLORIDE ER 20 MEQ PO TBCR: 20.0000 meq | EXTENDED_RELEASE_TABLET | Freq: Three times a day (TID) | ORAL | 3 refills | Status: DC

## 2024-01-24 MED ORDER — HEPARIN SOD (PORK) LOCK FLUSH 100 UNIT/ML IV SOLN
INTRAVENOUS | Status: AC
  Filled 2024-01-24: qty 5

## 2024-01-24 NOTE — Progress Notes (Signed)
 CRITICAL VALUE ALERT Critical value received:  K+ 2.2 Date of notification:  01-24-24 Time of notification: 1030 Critical value read back:  Yes.   Nurse who received alert:  C. PageRN MD notified time and response:  1031 Dr. Katragada

## 2024-01-24 NOTE — Progress Notes (Addendum)
 Patient declined potassium infusion today for K+ 2.2.  Per Dr. Cheree Cords, he was advised to take 4 potassium 10 meq tabs 4 times today and resume (1) 20 meq tablet tid tomorrow.  New script sent to [facility] pharmacy for 20 meq tablets. Verbalized understanding.  Patient made aware to report to the ER if he should develop irregular heart rate, sob or chest pain.  Verbalized understanding.  States he is going out of town this afternoon.

## 2024-02-01 NOTE — Progress Notes (Signed)
 [facility] [address] [place] [address]    Clinic Day:  02/02/2024  Referring physician: Kathyleen Parkins, MD  Patient Care Team: Kathyleen Parkins, MD as PCP - General (Internal Medicine) Paulett Boros, MD as Medical Oncologist (Medical Oncology) Gerhard Knuckles, RN as Oncology Nurse Navigator (Medical Oncology)   ASSESSMENT & PLAN:   Assessment: 1. Extensive stage small cell lung cancer: - Productive cough for 3 weeks with hemoptysis.  Hemoptysis improved after Eliquis stopped 2 weeks ago.  Still has some purulent sputum.  Currently on Levaquin . - Abnormal chest x-ray on 01/19/2022 for productive cough - CT chest with contrast (01/21/2022): Right hilar mass measuring 6.5 x 5.2 cm surrounding and compressing the right upper lobe bronchus, bronchus intermedius, right middle lobe bronchus and right lower lobe bronchus centrally.  Compression of the right lower lobe pulmonary artery.  Nodular soft tissue lesion in the right lower lobe continuous with right hilar mass and could represent endobronchial spread.  Enlarged mediastinal lymph nodes but no findings of pulmonary metastatic disease.  Indeterminate bilateral adrenal gland nodules and low attenuation liver lesions, likely benign cyst. - No weight loss.  No chest pains. - PET scan: Right hilar mass involving upper lobe, middle lobe and lower lobe with satellite nodularity in the right lower lobe, hypermetabolic subcarinal and right paratracheal lymphadenopathy.  Pleural-based nodule along the right posterior costophrenic angle.  Hypermetabolic left adrenal lesion.  Small hypermetabolic lesions in the humerus bilaterally and a subcutaneous focus in the right posterior lower back. - Right lung hilar mass biopsy (01/29/2022): Small cell carcinoma -4 cycles of carboplatin , etoposide  and atezolizumab  from 02/23/2022 through 04/29/2022, maintenance Atezolizumab  last dose on 09/01/2023.  Held due to pneumonitis.   2.  Social/family history: - He lives at home with his wife Larry Moore.  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  Smoked 1 pack/day for 50 years. - Father had renal cell carcinoma.    Plan: Extensive stage small cell lung cancer: - He reports that his respiratory status is good. - He met with Dr. Sibyl Drafts at [facility].  He discussed options including tarlatamab and potential clinical trials. - Reviewed CT CAP from 01/24/2024: Stable postradiation changes with no evidence of recurrence.  Other benign findings were discussed. - We have also reviewed MRI brain from 01/24/2024: No evidence of intracranial metastatic disease.  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 - We also discussed potential options including lurbinectedin, tarlatamab, clinical trials, chemotherapy.  Rechallenging with immunotherapy will be last option. - I have recommended that we continue monitoring with CT scans every 3 months.   2.  Pneumonitis: - He is currently taking prednisone  5 mg daily.  No sign of pneumonitis on the CT scan.  Recommend take prednisone  5 mg every other day for 10 days and stop.  3.  Hypokalemia: - Continue potassium 10 mEq 2 tablets 3 times daily.  4.  CHF (HFpEF): - Continue Lasix  40 mg daily, metolazone  2.5 mg daily and sotalol  80 mg twice daily.    Orders Placed This Encounter  Procedures   CT CHEST ABDOMEN PELVIS W CONTRAST    Standing Status:   Future    Expected Date:   05/03/2024    Expiration Date:   02/01/2025    If indicated for the ordered procedure, I authorize the administration of contrast media per Radiology protocol:   Yes    Does the patient have a contrast  media/X-ray dye allergy?:   No    Preferred imaging location?:   [facility]    Release to patient:   Immediate    If indicated for the ordered procedure, I authorize the administration of oral contrast media per Radiology protocol:   Yes      I,Helena R Teague,acting  as a scribe for Paulett Boros, MD.,have documented all relevant documentation on the behalf of Paulett Boros, MD,as directed by  Paulett Boros, MD while in the presence of Paulett Boros, MD.  I, Paulett Boros MD, have reviewed the above documentation for accuracy and completeness, and I agree with the above.     Paulett Boros, MD   4/30/20252:40 PM  CHIEF COMPLAINT:   Diagnosis: extensive stage small cell lung cancer    Cancer Staging  Small cell lung cancer, right upper lobe (HCC) Staging form: Lung, AJCC 8th Edition - Clinical stage from 01/22/2022: Stage IVB (cT4, cN2, pM1c) - Signed by Paulett Boros, MD on 02/16/2022    Prior Therapy: 4 cycles of carboplatin , etoposide  and Atezolizumab  completed on 04/29/2022   Current Therapy:  Maintenance Atezolizumab  every 3 weeks    HISTORY OF PRESENT ILLNESS:   Oncology History  Small cell lung cancer, right upper lobe (HCC)  01/22/2022 Initial Diagnosis   Small cell lung cancer, right upper lobe (HCC)   01/22/2022 Cancer Staging   Staging form: Lung, AJCC 8th Edition - Clinical stage from 01/22/2022: Stage IVB (cT4, cN2, pM1c) - Signed by Paulett Boros, MD on 02/16/2022 Histopathologic type: Small cell carcinoma, NOS Stage prefix: Initial diagnosis   02/23/2022 - 05/25/2022 Chemotherapy   Patient is on Treatment Plan : LUNG SCLC Carboplatin  + Etoposide  + Atezolizumab  Induction q21d / Atezolizumab  Maintenance q21d     02/23/2022 -  Chemotherapy   Patient is on Treatment Plan : LUNG SCLC Carboplatin  + Etoposide  + Atezolizumab  Induction q21d x 4 cycles / Atezolizumab  Maintenance q21d        INTERVAL HISTORY:   Larry Moore is a 73 y.o. male presenting to clinic today for follow up of extensive stage small cell lung cancer. He was last seen by me on 12/23/23.  Since his last visit, he underwent CT CAP on 01/24/24 that found:  Stable post radiation change in the RIGHT perihilar lung. No evidence  of recurrent lung cancer. No evidence of metastatic adenopathy. New partially healed posterior LEFT rib fracture. 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 No change from comparison exams. No evidence of metastatic disease in the abdomen pelvis. Stable low-density lesions in the liver. Favor benign cysts or hemangiomas. Stable benign adrenal adenomas.  MRI brain done on 01/24/24 showed:  No evidence of intracranial metastases. Unchanged chronic infarct in the right basal ganglia.  Today, he states that he is doing well overall. His appetite level is at 100%. His energy level is at 75%.   PAST MEDICAL HISTORY:   Past Medical History: Past Medical History:  Diagnosis Date   Dyspnea    Dysrhythmia    hx a-fib   Hypertension    Insomnia    Pre-diabetes    Sleep apnea     Surgical History: Past Surgical History:  Procedure Laterality Date   ATRIAL FIBRILLATION ABLATION     approx 2020   CARDIAC CATHETERIZATION     HERNIA REPAIR     IR IMAGING GUIDED PORT INSERTION  02/17/2022   IR VENO/JUGULAR RIGHT  02/17/2022   VIDEO BRONCHOSCOPY WITH ENDOBRONCHIAL ULTRASOUND Bilateral  01/29/2022   Procedure: VIDEO BRONCHOSCOPY WITH ENDOBRONCHIAL ULTRASOUND;  Surgeon: Prudy Brownie, DO;  Location: [facility] OR;  Service: Cardiopulmonary;  Laterality: Bilateral;  w/ Guardant [id number]    Social History: Social History   Socioeconomic History   Marital status: Married    Spouse name: Not on file   Number of children: Not on file   Years of education: Not on file   Highest education level: Not on file  Occupational History   Occupation: Retired  Tobacco Use   Smoking status: Every Day    Types: Cigarettes    Passive exposure: Current   Smokeless tobacco: Not on file   Tobacco comments:    Pt smokes 1 ppd. 01/26/22  Vaping Use   Vaping status: Never Used  Substance and Sexual Activity   Alcohol use: Not Currently    Comment: 1 bottle wine a  day    Drug use: No   Sexual activity: Not on file  Other Topics Concern   Not on file  Social History Narrative   Not on file   Social Drivers of Health   Financial Resource Strain: Low Risk  (12/16/2022)   Overall Financial Resource Strain (CARDIA)    Difficulty of Paying Living Expenses: Not hard at all  Food Insecurity: No Food Insecurity (11/14/2023)   Hunger Vital Sign    Worried About Running Out of Food in the Last Year: Never true    Ran Out of Food in the Last Year: Never true  Transportation Needs: No Transportation Needs (11/14/2023)   PRAPARE - Administrator, Civil Service (Medical): No    Lack of Transportation (Non-Medical): No  Physical Activity: Not on file  Stress: Not on file  Social Connections: Unknown (11/14/2023)   Social Connection and Isolation Panel [NHANES]    Frequency of Communication with Friends and Family: Patient unable to answer    Frequency of Social Gatherings with Friends and Family: Patient unable to answer    Attends Religious Services: Patient unable to answer    Active Member of Clubs or Organizations: Patient unable to answer    Attends Banker Meetings: Patient unable to answer    Marital Status: Married  Catering manager Violence: Not At Risk (11/14/2023)   Humiliation, Afraid, Rape, and Kick questionnaire    Fear of Current or Ex-Partner: No    Emotionally Abused: No    Physically Abused: No    Sexually Abused: No    Family History: Family History  Problem Relation Age of Onset   Coronary artery disease Father    Diabetes Father     Current Medications:  Current Outpatient Medications:    acetaminophen  (TYLENOL ) 500 MG tablet, Take 2 tablets (1,000 mg total) by mouth every 8 (eight) hours as needed for mild pain (pain score 1-3), fever or headache., Disp: , Rfl:    allopurinol  (ZYLOPRIM ) 300 MG tablet, Take 600 mg by mouth daily., Disp: , Rfl:    alprazolam  (XANAX ) 2 MG tablet, Take 1 tablet (2 mg total)  by mouth at bedtime as needed for anxiety., Disp: 30 tablet, Rfl: 3   furosemide  (LASIX ) 40 MG tablet, Take 1 tablet (40 mg total) by mouth daily., Disp: 30 tablet, Rfl: 3   lidocaine -prilocaine  (EMLA ) cream, Apply topically as directed., Disp: 30 g, Rfl: 3   metolazone  (ZAROXOLYN ) 2.5 MG tablet, Take 2.5 mg by mouth daily., Disp: , Rfl:    Multiple Vitamin (MULTIVITAMIN) tablet, Take 1 tablet by mouth daily.,  Disp: , Rfl:    nicotine  (NICODERM CQ  - DOSED IN MG/24 HOURS) 21 mg/24hr patch, Place 1 patch (21 mg total) onto the skin daily., Disp: 28 patch, Rfl: 0   pantoprazole  (PROTONIX ) 40 MG tablet, Take 1 tablet (40 mg total) by mouth daily., Disp: 30 tablet, Rfl: 1   potassium chloride  20 MEQ TBCR, Take 1 tablet (20 mEq total) by mouth 3 (three) times daily., Disp: 90 tablet, Rfl: 3   predniSONE  (DELTASONE ) 5 MG tablet, Take 1 tablet (5 mg total) by mouth daily with breakfast., Disp: 30 tablet, Rfl: 3   sotalol  (BETAPACE ) 80 MG tablet, Take 80 mg by mouth 2 (two) times daily., Disp: , Rfl:    zolpidem  (AMBIEN ) 10 MG tablet, Take 1 tablet (10 mg total) by mouth at bedtime as needed for sleep., Disp: 30 tablet, Rfl: 5   Allergies: No Known Allergies  REVIEW OF SYSTEMS:   Review of Systems  Constitutional:  Negative for chills, fatigue and fever.  HENT:   Negative for lump/mass, mouth sores, nosebleeds, sore throat and trouble swallowing.   Eyes:  Negative for eye problems.  Respiratory:  Negative for cough and shortness of breath.   Cardiovascular:  Negative for chest pain, leg swelling and palpitations.  Gastrointestinal:  Negative for abdominal pain, constipation, diarrhea, nausea and vomiting.  Genitourinary:  Negative for bladder incontinence, difficulty urinating, dysuria, frequency, hematuria and nocturia.   Musculoskeletal:  Negative for arthralgias, back pain, flank pain, myalgias and neck pain.  Skin:  Negative for itching and rash.  Neurological:  Negative for dizziness,  headaches and numbness.  Hematological:  Does not bruise/bleed easily.  Psychiatric/Behavioral:  Negative for depression, sleep disturbance and suicidal ideas. The patient is not nervous/anxious.   All other systems reviewed and are negative.    VITALS:   Blood pressure 91/67, pulse 75, temperature (!) 96.9 F (36.1 C), temperature source Tympanic, resp. rate 20, weight 223 lb 15.8 oz (101.6 kg), SpO2 98%.  Wt Readings from Last 3 Encounters:  02/02/24 223 lb 15.8 oz (101.6 kg)  12/23/23 214 lb 1.1 oz (97.1 kg)  11/25/23 202 lb 2.6 oz (91.7 kg)    Body mass index is 34.06 kg/m.  Performance status (ECOG): 1 - Symptomatic but completely ambulatory  PHYSICAL EXAM:   Physical Exam Vitals and nursing note reviewed. Exam conducted with a chaperone present.  Constitutional:      Appearance: Normal appearance.  Cardiovascular:     Rate and Rhythm: Normal rate and regular rhythm.     Pulses: Normal pulses.     Heart sounds: Normal heart sounds.  Pulmonary:     Effort: Pulmonary effort is normal.     Breath sounds: Normal breath sounds.  Abdominal:     Palpations: Abdomen is soft. There is no hepatomegaly, splenomegaly or mass.     Tenderness: There is no abdominal tenderness.  Musculoskeletal:     Right lower leg: No edema.     Left lower leg: No edema.  Lymphadenopathy:     Cervical: No cervical adenopathy.     Right cervical: No superficial, deep or posterior cervical adenopathy.    Left cervical: No superficial, deep or posterior cervical adenopathy.     Upper Body:     Right upper body: No supraclavicular or axillary adenopathy.     Left upper body: No supraclavicular or axillary adenopathy.  Neurological:     General: No focal deficit present.     Mental Status: He is alert and oriented  to person, place, and time.  Psychiatric:        Mood and Affect: Mood normal.        Behavior: Behavior normal.     LABS:      Latest Ref Rng & Units 01/24/2024    9:35 AM  12/23/2023    1:22 PM 11/22/2023    3:45 PM  CBC  WBC 4.0 - 10.5 K/uL 8.3  10.6  13.3   Hemoglobin 13.0 - 17.0 g/dL 60.4  54.0  98.1   Hematocrit 39.0 - 52.0 % 49.2  50.8  55.5   Platelets 150 - 400 K/uL 226  206  213       Latest Ref Rng & Units 01/24/2024    9:35 AM 12/23/2023    1:22 PM 11/22/2023    3:45 PM  CMP  Glucose 70 - 99 mg/dL 191  79  83   BUN 8 - 23 mg/dL 22  34  32   Creatinine 0.61 - 1.24 mg/dL 4.78  2.95  6.21   Sodium 135 - 145 mmol/L 136  137  138   Potassium 3.5 - 5.1 mmol/L 2.2  2.6  2.8   Chloride 98 - 111 mmol/L 86  83  83   CO2 22 - 32 mmol/L 36  39  41   Calcium 8.9 - 10.3 mg/dL 8.5  8.6  8.8   Total Protein 6.5 - 8.1 g/dL 6.8  6.9  6.9   Total Bilirubin 0.0 - 1.2 mg/dL 1.4  1.1  1.0   Alkaline Phos 38 - 126 U/L 72  60  58   AST 15 - 41 U/L 18  13  13    ALT 0 - 44 U/L 11  13  15       No results found for: "CEA1", "CEA" / No results found for: "CEA1", "CEA" No results found for: "PSA1" No results found for: "HYQ657" No results found for: "CAN125"  No results found for: "TOTALPROTELP", "ALBUMINELP", "A1GS", "A2GS", "BETS", "BETA2SER", "GAMS", "MSPIKE", "SPEI" No results found for: "TIBC", "FERRITIN", "IRONPCTSAT" Lab Results  Component Value Date   LDH 110 11/17/2023     STUDIES:   MR Brain W Wo Contrast Result Date: 02/01/2024 CLINICAL DATA:  Small-cell lung cancer. EXAM: MRI HEAD WITHOUT AND WITH CONTRAST TECHNIQUE: Multiplanar, multiecho pulse sequences of the brain and surrounding structures were obtained without and with intravenous contrast. CONTRAST:  10mL GADAVIST  GADOBUTROL  1 MMOL/ML IV SOLN COMPARISON:  Head MRI 07/28/2023 FINDINGS: Brain: There is no evidence of an acute infarct, mass, midline shift, or extra-axial fluid collection. A chronic lacunar infarct with associated chronic blood products is again noted in the right lentiform nucleus. There is mild cerebral atrophy. No abnormal enhancement is identified. Vascular: Major intracranial  vascular flow voids are preserved. Skull and upper cervical spine: Unremarkable bone marrow signal. Sinuses/Orbits: Bilateral cataract extraction. Minimal mucosal thickening in the paranasal sinuses. Clear mastoid air cells. Other: None. IMPRESSION: 1. No evidence of intracranial metastases. 2. Unchanged chronic infarct in the right basal ganglia. Electronically Signed   By: Aundra Lee M.D.   On: 02/01/2024 09:00   CT CHEST ABDOMEN PELVIS W CONTRAST Result Date: 02/01/2024 CLINICAL DATA:  Metastatic disease evaluation. Lung carcinoma. Small cell cancer. * Tracking Code: BO * EXAM: CT CHEST, ABDOMEN, AND PELVIS WITH CONTRAST TECHNIQUE: Multidetector CT imaging of the chest, abdomen and pelvis was performed following the standard protocol during bolus administration of intravenous contrast. RADIATION DOSE REDUCTION: This exam was performed according  to the departmental dose-optimization program which includes automated exposure control, adjustment of the mA and/or kV according to patient size and/or use of iterative reconstruction technique. CONTRAST:  OMNIPAQUE  IOHEXOL  300 MG/ML  SOLN COMPARISON:  None Available. FINDINGS: CT CHEST FINDINGS CT CHEST FINDINGS Cardiovascular: No significant vascular findings. Normal heart size. No pericardial effusion. Port in the anterior chest wall with tip in distal SVC. Mediastinum/Nodes: No mediastinal lymphadenopathy. No supraclavicular adenopathy. RIGHT perihilar lung consolidation linear pattern with bronchiectasis consistent with post radiation change. No interval growth. Lungs/Pleura: No straight patient change RIGHT hila as above. No pulmonary nodularity RIGHT lung. Volume loss RIGHT following therapies. LEFT lung mildly hyperinflated. Suspicious nodularity. Musculoskeletal: New posterior LEFT partially healed rib fracture (image 63/3.) Low-density soft tissue adjacent to the lower thoracic spine just above the retrocrural space not changed from prior. Potential  extra medullary hematopoiesis. (Image 42/2) CT ABDOMEN AND PELVIS FINDINGS Hepatobiliary: Multiple small low-density lesions again demonstrated within the liver. These are unchanged in size or number compared to prior. Gallbladder normal. No biliary duct dilatation. Pancreas: Pancreas is normal. No ductal dilatation. No pancreatic inflammation. Spleen: Normal spleen Adrenals/urinary tract: Low-density thickening of the adrenal glands most consistent benign adenomas. Kidneys, ureters and bladder normal. Stomach/Bowel: Stomach, small bowel, appendix, and cecum are normal. The colon and rectosigmoid colon are normal. Vascular/Lymphatic: Abdominal aorta is normal caliber. There is no retroperitoneal or periportal lymphadenopathy. No pelvic lymphadenopathy. Reproductive: Unremarkable Other: No free fluid. Musculoskeletal: No aggressive osseous lesion. IMPRESSION: CHEST: 1. Stable post radiation change in the RIGHT perihilar lung. 2. No evidence of recurrent lung cancer. 3. No evidence of metastatic adenopathy. 4. New partially healed posterior LEFT rib fracture. 5. 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 No change from comparison exams. PELVIS: 1. No evidence of metastatic disease in the abdomen pelvis. 2. Stable low-density lesions in the liver. Favor benign cysts or hemangiomas. 3. Stable benign adrenal adenomas. Electronically Signed   By: Deboraha Fallow M.D.   On: 02/01/2024 08:34

## 2024-02-02 ENCOUNTER — Inpatient Hospital Stay: Admitting: Hematology

## 2024-02-02 VITALS — BP 91/67 | HR 75 | Temp 96.9°F | Resp 20 | Wt 224.0 lb

## 2024-02-02 DIAGNOSIS — J449 Chronic obstructive pulmonary disease, unspecified: Secondary | ICD-10-CM | POA: Diagnosis not present

## 2024-02-02 DIAGNOSIS — C3411 Malignant neoplasm of upper lobe, right bronchus or lung: Secondary | ICD-10-CM | POA: Diagnosis not present

## 2024-02-02 DIAGNOSIS — Z85118 Personal history of other malignant neoplasm of bronchus and lung: Secondary | ICD-10-CM | POA: Diagnosis not present

## 2024-02-02 DIAGNOSIS — F17210 Nicotine dependence, cigarettes, uncomplicated: Secondary | ICD-10-CM | POA: Diagnosis not present

## 2024-02-02 DIAGNOSIS — Z7952 Long term (current) use of systemic steroids: Secondary | ICD-10-CM | POA: Diagnosis not present

## 2024-02-02 DIAGNOSIS — J984 Other disorders of lung: Secondary | ICD-10-CM | POA: Insufficient documentation

## 2024-02-02 DIAGNOSIS — I10 Essential (primary) hypertension: Secondary | ICD-10-CM | POA: Diagnosis not present

## 2024-02-02 DIAGNOSIS — E876 Hypokalemia: Secondary | ICD-10-CM | POA: Diagnosis not present

## 2024-02-02 DIAGNOSIS — I509 Heart failure, unspecified: Secondary | ICD-10-CM | POA: Insufficient documentation

## 2024-02-02 DIAGNOSIS — Z9221 Personal history of antineoplastic chemotherapy: Secondary | ICD-10-CM | POA: Diagnosis not present

## 2024-02-02 DIAGNOSIS — Z8051 Family history of malignant neoplasm of kidney: Secondary | ICD-10-CM | POA: Diagnosis not present

## 2024-02-02 DIAGNOSIS — Z79899 Other long term (current) drug therapy: Secondary | ICD-10-CM | POA: Insufficient documentation

## 2024-02-02 DIAGNOSIS — G4733 Obstructive sleep apnea (adult) (pediatric): Secondary | ICD-10-CM | POA: Diagnosis not present

## 2024-02-02 MED ORDER — POTASSIUM CHLORIDE ER 10 MEQ PO TBCR: EXTENDED_RELEASE_TABLET | ORAL | 3 refills | Status: AC

## 2024-02-02 NOTE — Patient Instructions (Addendum)
  [facility] at [facility] Discharge Instructions   You were seen and examined today by Dr. Cheree Cords.  He reviewed the results of your lab work which are normal/stable.   He reviewed the results of your CT scan which is not showing any evidence of cancer.   The MRI of the brain did not show any evidence of cancer.   We will see you back in 3 months. We will repeat a CT scan prior to this visit.   Return as scheduled.    Thank you for choosing [facility] at [facility] to provide your oncology and hematology care.  To afford each patient quality time with our provider, please arrive at least 15 minutes before your scheduled appointment time.   If you have a lab appointment with the Cancer Center please come in thru the Main Entrance and check in at the main information desk.  You need to re-schedule your appointment should you arrive 10 or more minutes late.  We strive to give you quality time with our providers, and arriving late affects you and other patients whose appointments are after yours.  Also, if you no show three or more times for appointments you may be dismissed from the clinic at the providers discretion.     Again, thank you for choosing [facility].  Our hope is that these requests will decrease the amount of time that you wait before being seen by our physicians.       _____________________________________________________________  Should you have questions after your visit to [facility], please contact our office at [phone] and follow the prompts.  Our office hours are 8:00 a.m. and 4:30 p.m. Monday - Friday.  Please note that voicemails left after 4:00 p.m. may not be returned until the following business day.  We are closed weekends and major holidays.  You do have access to a nurse 24-7, just call the main number to the clinic [phone] and do not press any options, hold on the line and a  nurse will answer the phone.    For prescription refill requests, have your pharmacy contact our office and allow 72 hours.    Due to Covid, you will need to wear a mask upon entering the hospital. If you do not have a mask, a mask will be given to you at the Main Entrance upon arrival. For doctor visits, patients may have 1 support person age 73 or older with them. For treatment visits, patients can not have anyone with them due to social distancing guidelines and our immunocompromised population.

## 2024-02-02 NOTE — Addendum Note (Signed)
 Addended by: Alen Amy on: 02/02/2024 03:24 PM   Modules accepted: Orders

## 2024-02-03 ENCOUNTER — Other Ambulatory Visit: Payer: Self-pay

## 2024-03-04 DIAGNOSIS — G4733 Obstructive sleep apnea (adult) (pediatric): Secondary | ICD-10-CM | POA: Diagnosis not present

## 2024-03-04 DIAGNOSIS — J449 Chronic obstructive pulmonary disease, unspecified: Secondary | ICD-10-CM | POA: Diagnosis not present

## 2024-03-04 DIAGNOSIS — I10 Essential (primary) hypertension: Secondary | ICD-10-CM | POA: Diagnosis not present

## 2024-03-29 DIAGNOSIS — E6609 Other obesity due to excess calories: Secondary | ICD-10-CM | POA: Diagnosis not present

## 2024-03-29 DIAGNOSIS — C3490 Malignant neoplasm of unspecified part of unspecified bronchus or lung: Secondary | ICD-10-CM | POA: Diagnosis not present

## 2024-03-29 DIAGNOSIS — Z6836 Body mass index (BMI) 36.0-36.9, adult: Secondary | ICD-10-CM | POA: Diagnosis not present

## 2024-03-29 DIAGNOSIS — I4891 Unspecified atrial fibrillation: Secondary | ICD-10-CM | POA: Diagnosis not present

## 2024-03-30 ENCOUNTER — Other Ambulatory Visit: Payer: Self-pay | Admitting: Hematology

## 2024-04-03 DIAGNOSIS — J449 Chronic obstructive pulmonary disease, unspecified: Secondary | ICD-10-CM | POA: Diagnosis not present

## 2024-04-03 DIAGNOSIS — B351 Tinea unguium: Secondary | ICD-10-CM | POA: Diagnosis not present

## 2024-04-03 DIAGNOSIS — M79674 Pain in right toe(s): Secondary | ICD-10-CM | POA: Diagnosis not present

## 2024-04-03 DIAGNOSIS — G4733 Obstructive sleep apnea (adult) (pediatric): Secondary | ICD-10-CM | POA: Diagnosis not present

## 2024-04-03 DIAGNOSIS — I10 Essential (primary) hypertension: Secondary | ICD-10-CM | POA: Diagnosis not present

## 2024-04-03 DIAGNOSIS — M79675 Pain in left toe(s): Secondary | ICD-10-CM | POA: Diagnosis not present

## 2024-04-25 ENCOUNTER — Ambulatory Visit (HOSPITAL_COMMUNITY)
Admission: RE | Admit: 2024-04-25 | Discharge: 2024-04-25 | Disposition: A | Discharge status: Home / Self Care | Source: Ambulatory Visit | Attending: Hematology | Admitting: Hematology

## 2024-04-25 ENCOUNTER — Inpatient Hospital Stay: Attending: Hematology

## 2024-04-25 DIAGNOSIS — K5730 Diverticulosis of large intestine without perforation or abscess without bleeding: Secondary | ICD-10-CM | POA: Diagnosis not present

## 2024-04-25 DIAGNOSIS — I5030 Unspecified diastolic (congestive) heart failure: Secondary | ICD-10-CM | POA: Diagnosis not present

## 2024-04-25 DIAGNOSIS — Z9221 Personal history of antineoplastic chemotherapy: Secondary | ICD-10-CM | POA: Diagnosis not present

## 2024-04-25 DIAGNOSIS — J8410 Pulmonary fibrosis, unspecified: Secondary | ICD-10-CM | POA: Diagnosis not present

## 2024-04-25 DIAGNOSIS — I2510 Atherosclerotic heart disease of native coronary artery without angina pectoris: Secondary | ICD-10-CM | POA: Insufficient documentation

## 2024-04-25 DIAGNOSIS — I700 Atherosclerosis of aorta: Secondary | ICD-10-CM | POA: Insufficient documentation

## 2024-04-25 DIAGNOSIS — C3411 Malignant neoplasm of upper lobe, right bronchus or lung: Secondary | ICD-10-CM | POA: Insufficient documentation

## 2024-04-25 DIAGNOSIS — R918 Other nonspecific abnormal finding of lung field: Secondary | ICD-10-CM | POA: Diagnosis not present

## 2024-04-25 DIAGNOSIS — F17210 Nicotine dependence, cigarettes, uncomplicated: Secondary | ICD-10-CM | POA: Diagnosis not present

## 2024-04-25 DIAGNOSIS — C3490 Malignant neoplasm of unspecified part of unspecified bronchus or lung: Secondary | ICD-10-CM | POA: Diagnosis not present

## 2024-04-25 DIAGNOSIS — Z923 Personal history of irradiation: Secondary | ICD-10-CM | POA: Diagnosis not present

## 2024-04-25 DIAGNOSIS — E876 Hypokalemia: Secondary | ICD-10-CM | POA: Insufficient documentation

## 2024-04-25 DIAGNOSIS — C7972 Secondary malignant neoplasm of left adrenal gland: Secondary | ICD-10-CM | POA: Diagnosis not present

## 2024-04-25 DIAGNOSIS — K769 Liver disease, unspecified: Secondary | ICD-10-CM | POA: Diagnosis not present

## 2024-04-25 LAB — COMPREHENSIVE METABOLIC PANEL WITH GFR
ALT: 11 U/L (ref 0–44)
AST: 17 U/L (ref 15–41)
Albumin: 3.5 g/dL (ref 3.5–5.0)
Alkaline Phosphatase: 65 U/L (ref 38–126)
Anion gap: 13 (ref 5–15)
BUN: 17 mg/dL (ref 8–23)
CO2: 28 mmol/L (ref 22–32)
Calcium: 8 mg/dL — ABNORMAL LOW (ref 8.9–10.3)
Chloride: 93 mmol/L — ABNORMAL LOW (ref 98–111)
Creatinine, Ser: 0.96 mg/dL (ref 0.61–1.24)
GFR, Estimated: >60 mL/min (ref >60)
Glucose, Bld: 134 mg/dL — ABNORMAL HIGH (ref 70–99)
Potassium: 2.7 mmol/L — CL (ref 3.5–5.1)
Sodium: 134 mmol/L — ABNORMAL LOW (ref 135–145)
Total Bilirubin: 0.8 mg/dL (ref 0.0–1.2)
Total Protein: 6.5 g/dL (ref 6.5–8.1)

## 2024-04-25 LAB — CBC WITH DIFFERENTIAL/PLATELET
Abs Immature Granulocytes: 0.06 K/uL (ref 0.00–0.07)
Basophils Absolute: 0 K/uL (ref 0.0–0.1)
Basophils Relative: 0 %
Eosinophils Absolute: 0.1 K/uL (ref 0.0–0.5)
Eosinophils Relative: 1 %
HCT: 45.9 % (ref 39.0–52.0)
Hemoglobin: 16 g/dL (ref 13.0–17.0)
Immature Granulocytes: 1 %
Lymphocytes Relative: 17 %
Lymphs Abs: 1.1 K/uL (ref 0.7–4.0)
MCH: 32.1 pg (ref 26.0–34.0)
MCHC: 34.9 g/dL (ref 30.0–36.0)
MCV: 92 fL (ref 80.0–100.0)
Monocytes Absolute: 0.6 K/uL (ref 0.1–1.0)
Monocytes Relative: 8 %
Neutro Abs: 4.9 K/uL (ref 1.7–7.7)
Neutrophils Relative %: 73 %
Platelets: 227 K/uL (ref 150–400)
RBC: 4.99 MIL/uL (ref 4.22–5.81)
RDW: 12.9 % (ref 11.5–15.5)
WBC: 6.8 K/uL (ref 4.0–10.5)
nRBC: 0 % (ref 0.0–0.2)

## 2024-04-25 LAB — MAGNESIUM: Magnesium: 1.9 mg/dL (ref 1.7–2.4)

## 2024-04-25 MED ORDER — HEPARIN SOD (PORK) LOCK FLUSH 100 UNIT/ML IV SOLN
500.0000 [IU] | Freq: Once | INTRAVENOUS | Status: AC
  Administered 2024-04-25: 500 [IU] via INTRAVENOUS

## 2024-04-25 MED ORDER — SODIUM CHLORIDE 0.9% FLUSH
10.0000 mL | Freq: Once | INTRAVENOUS | Status: AC
  Administered 2024-04-25: 10 mL via INTRAVENOUS

## 2024-04-25 MED ORDER — HEPARIN SOD (PORK) LOCK FLUSH 100 UNIT/ML IV SOLN
INTRAVENOUS | Status: AC
  Filled 2024-04-25: qty 5

## 2024-04-25 MED ORDER — IOHEXOL 300 MG/ML  SOLN
100.0000 mL | Freq: Once | INTRAMUSCULAR | Status: AC | PRN
  Administered 2024-04-25: 100 mL via INTRAVENOUS

## 2024-04-25 MED ORDER — HEPARIN SOD (PORK) LOCK FLUSH 100 UNIT/ML IV SOLN: 500.0000 [IU] | Freq: Once | INTRAVENOUS | Status: DC

## 2024-04-25 NOTE — Progress Notes (Addendum)
 CRITICAL VALUE ALERT Critical value received:  K+ 2.7 Date of notification:  04-25-24 Time of notification: 1015 Critical value read back:  Yes.   Nurse who received alert:  C. Jarett Dralle RN MD notified time and response:  1020, Dr. Rogers no new orders at this time

## 2024-04-25 NOTE — Progress Notes (Signed)
 Patients port flushed without difficulty.  Good blood return noted with no bruising or swelling noted at site.  Labs drawn per orders. Port left accessed due to pt having a CT scan today. Port to be deaccessed by radiology.  VSS with discharge and left in satisfactory condition with no s/s of distress noted. All follow ups as scheduled.       Shemuel Harkleroad

## 2024-05-04 ENCOUNTER — Inpatient Hospital Stay: Admitting: Hematology

## 2024-05-04 VITALS — BP 92/54 | HR 65 | Temp 97.6°F | Resp 18 | Wt 234.3 lb

## 2024-05-04 DIAGNOSIS — C3411 Malignant neoplasm of upper lobe, right bronchus or lung: Secondary | ICD-10-CM

## 2024-05-04 DIAGNOSIS — G4733 Obstructive sleep apnea (adult) (pediatric): Secondary | ICD-10-CM | POA: Diagnosis not present

## 2024-05-04 DIAGNOSIS — I10 Essential (primary) hypertension: Secondary | ICD-10-CM | POA: Diagnosis not present

## 2024-05-04 DIAGNOSIS — J449 Chronic obstructive pulmonary disease, unspecified: Secondary | ICD-10-CM | POA: Diagnosis not present

## 2024-05-04 NOTE — Progress Notes (Signed)
 [facility] [address], [place] [address]    Clinic Day:  05/04/2024  Referring physician: Bertell Satterfield, MD  Patient Care Team: Bertell Satterfield, MD as PCP - General (Internal Medicine) Rogers Hai, MD as Medical Oncologist (Medical Oncology) Celestia Joesph SQUIBB, RN as Oncology Nurse Navigator (Medical Oncology)   ASSESSMENT & PLAN:   Assessment: 1. Extensive stage small cell lung cancer: - Productive cough for 3 weeks with hemoptysis.  Hemoptysis improved after Eliquis stopped 2 weeks ago.  Still has some purulent sputum.  Currently on Levaquin . - Abnormal chest x-ray on 01/19/2022 for productive cough - CT chest with contrast (01/21/2022): Right hilar mass measuring 6.5 x 5.2 cm surrounding and compressing the right upper lobe bronchus, bronchus intermedius, right middle lobe bronchus and right lower lobe bronchus centrally.  Compression of the right lower lobe pulmonary artery.  Nodular soft tissue lesion in the right lower lobe continuous with right hilar mass and could represent endobronchial spread.  Enlarged mediastinal lymph nodes but no findings of pulmonary metastatic disease.  Indeterminate bilateral adrenal gland nodules and low attenuation liver lesions, likely benign cyst. - No weight loss.  No chest pains. - PET scan: Right hilar mass involving upper lobe, middle lobe and lower lobe with satellite nodularity in the right lower lobe, hypermetabolic subcarinal and right paratracheal lymphadenopathy.  Pleural-based nodule along the right posterior costophrenic angle.  Hypermetabolic left adrenal lesion.  Small hypermetabolic lesions in the humerus bilaterally and a subcutaneous focus in the right posterior lower back. - Right lung hilar mass biopsy (01/29/2022): Small cell carcinoma -4 cycles of carboplatin , etoposide  and atezolizumab  from 02/23/2022 through 04/29/2022, maintenance Atezolizumab  last dose on 09/01/2023.  Held due to pneumonitis. - He  met with Dr. Boyd at [facility] to discuss tarlatamab and clinical trials if there is progression.   2. Social/family history: - He lives at home with his wife Larry Moore.  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  Smoked 1 pack/day for 50 years. - Father had renal cell carcinoma.    Plan: Extensive stage small cell lung cancer: - He denies any change in breathing status.  No chest pains reported. - Labs from 04/25/2024: Normal LFTs and CBC. - CT CAP on 04/25/2024: Postradiation appearance in the right chest with dense perihilar consolidation and fibrosis.  Unchanged left adrenal gland metastasis.  No evidence of new or progressive disease. - Last brain MRI was on 01/24/2024, negative. - Will continue close monitoring at the time.  RTC 4 months for follow-up with repeat imaging.  If he has progression, potential options include tarlatamab, lurbinectedin, or clinical trials.  Rechallenging with immunotherapy will be the last option.   2.  Pneumonitis: - At last visit we have discontinued prednisone .  No sign of pneumonitis on CT scan.  3.  Hypokalemia: - He will continue potassium 10 mEq 2 tablets 3 times daily.  He has missed doses prior to last labs on 04/25/2024.  4.  CHF (HFpEF): - He will continue Lasix  40 mg daily, metolazone  2.5 mg daily and sotalol  80 mg twice daily.    Orders Placed This Encounter  Procedures   MR Brain W Wo Contrast    Standing Status:   Future    Expected Date:   08/04/2024    Expiration Date:   05/04/2025    If indicated for the ordered procedure, I authorize the administration of contrast media per Radiology protocol:   Yes  What is the patient's sedation requirement?:   No Sedation    Does the patient have a pacemaker or implanted devices?:   No    Use SRS Protocol?:   No    Preferred imaging location?:   [facility] (table limit - 500lbs)   CT CHEST ABDOMEN PELVIS W CONTRAST    Standing Status:   Future    Expected Date:    08/04/2024    Expiration Date:   05/04/2025    If indicated for the ordered procedure, I authorize the administration of contrast media per Radiology protocol:   Yes    Does the patient have a contrast media/X-ray dye allergy?:   No    Preferred imaging location?:   [facility]    If indicated for the ordered procedure, I authorize the administration of oral contrast media per Radiology protocol:   Yes      I,Helena R Teague,acting as a scribe for Alean Stands, MD.,have documented all relevant documentation on the behalf of Alean Stands, MD,as directed by  Alean Stands, MD while in the presence of Alean Stands, MD.  I, Alean Stands MD, have reviewed the above documentation for accuracy and completeness, and I agree with the above.      Alean Stands, MD   7/31/202512:53 PM  CHIEF COMPLAINT:   Diagnosis: extensive stage small cell lung cancer    Cancer Staging  Small cell lung cancer, right upper lobe (HCC) Staging form: Lung, AJCC 8th Edition - Clinical stage from 01/22/2022: Stage IVB (cT4, cN2, pM1c) - Signed by Stands Alean, MD on 02/16/2022    Prior Therapy: 4 cycles of carboplatin , etoposide  and Atezolizumab  completed on 04/29/2022   Current Therapy:  Maintenance Atezolizumab  every 3 weeks    HISTORY OF PRESENT ILLNESS:   Oncology History  Small cell lung cancer, right upper lobe (HCC)  01/22/2022 Initial Diagnosis   Small cell lung cancer, right upper lobe (HCC)   01/22/2022 Cancer Staging   Staging form: Lung, AJCC 8th Edition - Clinical stage from 01/22/2022: Stage IVB (cT4, cN2, pM1c) - Signed by Stands Alean, MD on 02/16/2022 Histopathologic type: Small cell carcinoma, NOS Stage prefix: Initial diagnosis   02/23/2022 - 05/25/2022 Chemotherapy   Patient is on Treatment Plan : LUNG SCLC Carboplatin  + Etoposide  + Atezolizumab  Induction q21d / Atezolizumab  Maintenance q21d     02/23/2022 -  Chemotherapy    Patient is on Treatment Plan : LUNG SCLC Carboplatin  + Etoposide  + Atezolizumab  Induction q21d x 4 cycles / Atezolizumab  Maintenance q21d        INTERVAL HISTORY:   Larry [facility] is a 73 y.o. male presenting to clinic today for follow up of extensive stage small cell lung cancer. He was last seen by me on 02/02/2024.  Since his last visit, he underwent CT CAP on 04/25/2024 that found: Unchanged post treatment/post radiation appearance of the right chest with dense perihilar consolidation and fibrosis. Unchanged treated metastasis of the lateral limb of the left adrenal gland with underlying benign adrenal adenomatous thickening. No evidence of new or progressive disease in the chest, abdomen, or pelvis. Coronary artery disease.  Today, he states that he is doing well overall. His appetite level is at 100%. His energy level is at 75%. Larry Moore is accompanied by his wife. His cough is at baseline. His breathing has not worsened since discontinuing Prednisone .   [place]enn was on vacation for 10 days and missed taking medications for a few days, including potassium. He is taking metolazone   and sotalol  as prescribed.   PAST MEDICAL HISTORY:   Past Medical History: Past Medical History:  Diagnosis Date   Dyspnea    Dysrhythmia    hx a-fib   Hypertension    Insomnia    Pre-diabetes    Sleep apnea     Surgical History: Past Surgical History:  Procedure Laterality Date   ATRIAL FIBRILLATION ABLATION     approx 2020   CARDIAC CATHETERIZATION     HERNIA REPAIR     IR IMAGING GUIDED PORT INSERTION  02/17/2022   IR VENO/JUGULAR RIGHT  02/17/2022   VIDEO BRONCHOSCOPY WITH ENDOBRONCHIAL ULTRASOUND Bilateral 01/29/2022   Procedure: VIDEO BRONCHOSCOPY WITH ENDOBRONCHIAL ULTRASOUND;  Surgeon: Brenna Adine CROME, DO;  Location: [facility] OR;  Service: Cardiopulmonary;  Laterality: Bilateral;  w/ Guardant [id number]    Social History: Social History   Socioeconomic History   Marital status: Married    Spouse name:  Not on file   Number of children: Not on file   Years of education: Not on file   Highest education level: Not on file  Occupational History   Occupation: Retired  Tobacco Use   Smoking status: Every Day    Types: Cigarettes    Passive exposure: Current   Smokeless tobacco: Not on file   Tobacco comments:    Pt smokes 1 ppd. 01/26/22  Vaping Use   Vaping status: Never Used  Substance and Sexual Activity   Alcohol use: Not Currently    Comment: 1 bottle wine a day    Drug use: No   Sexual activity: Not on file  Other Topics Concern   Not on file  Social History Narrative   Not on file   Social Drivers of Health   Financial Resource Strain: Low Risk  (12/16/2022)   Overall Financial Resource Strain (CARDIA)    Difficulty of Paying Living Expenses: Not hard at all  Food Insecurity: No Food Insecurity (11/14/2023)   Hunger Vital Sign    Worried About Running Out of Food in the Last Year: Never true    Ran Out of Food in the Last Year: Never true  Transportation Needs: No Transportation Needs (11/14/2023)   PRAPARE - Administrator, Civil Service (Medical): No    Lack of Transportation (Non-Medical): No  Physical Activity: Not on file  Stress: Not on file  Social Connections: Unknown (11/14/2023)   Social Connection and Isolation Panel    Frequency of Communication with Friends and Family: Patient unable to answer    Frequency of Social Gatherings with Friends and Family: Patient unable to answer    Attends Religious Services: Patient unable to answer    Active Member of Clubs or Organizations: Patient unable to answer    Attends Banker Meetings: Patient unable to answer    Marital Status: Married  Catering manager Violence: Not At Risk (11/14/2023)   Humiliation, Afraid, Rape, and Kick questionnaire    Fear of Current or Ex-Partner: No    Emotionally Abused: No    Physically Abused: No    Sexually Abused: No    Family History: Family History   Problem Relation Age of Onset   Coronary artery disease Father    Diabetes Father     Current Medications:  Current Outpatient Medications:    acetaminophen  (TYLENOL ) 500 MG tablet, Take 2 tablets (1,000 mg total) by mouth every 8 (eight) hours as needed for mild pain (pain score 1-3), fever or headache., Disp: , Rfl:  allopurinol  (ZYLOPRIM ) 300 MG tablet, Take 600 mg by mouth daily., Disp: , Rfl:    alprazolam  (XANAX ) 2 MG tablet, Take 1 tablet (2 mg total) by mouth at bedtime as needed for anxiety., Disp: 30 tablet, Rfl: 5   furosemide  (LASIX ) 40 MG tablet, Take 1 tablet (40 mg total) by mouth daily., Disp: 30 tablet, Rfl: 3   lidocaine -prilocaine  (EMLA ) cream, Apply topically as directed., Disp: 30 g, Rfl: 3   metolazone  (ZAROXOLYN ) 2.5 MG tablet, Take 2.5 mg by mouth daily., Disp: , Rfl:    Multiple Vitamin (MULTIVITAMIN) tablet, Take 1 tablet by mouth daily., Disp: , Rfl:    nicotine  (NICODERM CQ  - DOSED IN MG/24 HOURS) 21 mg/24hr patch, Place 1 patch (21 mg total) onto the skin daily., Disp: 28 patch, Rfl: 0   pantoprazole  (PROTONIX ) 40 MG tablet, Take 1 tablet (40 mg total) by mouth daily., Disp: 30 tablet, Rfl: 1   potassium chloride  (KLOR-CON ) 10 MEQ tablet, Take 2 tablets by mouth three times daily, Disp: 180 tablet, Rfl: 3   predniSONE  (DELTASONE ) 5 MG tablet, Take 1 tablet (5 mg total) by mouth daily with breakfast., Disp: 30 tablet, Rfl: 3   sotalol  (BETAPACE ) 80 MG tablet, Take 80 mg by mouth 2 (two) times daily., Disp: , Rfl:    zolpidem  (AMBIEN ) 10 MG tablet, Take 1 tablet (10 mg total) by mouth at bedtime as needed for sleep., Disp: 30 tablet, Rfl: 5   Allergies: No Known Allergies  REVIEW OF SYSTEMS:   Review of Systems  Constitutional:  Negative for chills, fatigue and fever.  HENT:   Negative for lump/mass, mouth sores, nosebleeds, sore throat and trouble swallowing.   Eyes:  Negative for eye problems.  Respiratory:  Positive for cough. Negative for shortness of  breath.   Cardiovascular:  Negative for chest pain, leg swelling and palpitations.  Gastrointestinal:  Negative for abdominal pain, constipation, diarrhea, nausea and vomiting.  Genitourinary:  Negative for bladder incontinence, difficulty urinating, dysuria, frequency, hematuria and nocturia.   Musculoskeletal:  Negative for arthralgias, back pain, flank pain, myalgias and neck pain.  Skin:  Negative for itching and rash.  Neurological:  Negative for dizziness, headaches and numbness.  Hematological:  Does not bruise/bleed easily.  Psychiatric/Behavioral:  Negative for depression, sleep disturbance and suicidal ideas. The patient is not nervous/anxious.   All other systems reviewed and are negative.    VITALS:   Blood pressure (!) 92/54, pulse 65, temperature 97.6 F (36.4 C), temperature source Oral, resp. rate 18, weight 234 lb 5.6 oz (106.3 kg), SpO2 92%.  Wt Readings from Last 3 Encounters:  05/04/24 234 lb 5.6 oz (106.3 kg)  02/02/24 223 lb 15.8 oz (101.6 kg)  12/23/23 214 lb 1.1 oz (97.1 kg)    Body mass index is 35.63 kg/m.  Performance status (ECOG): 1 - Symptomatic but completely ambulatory  PHYSICAL EXAM:   Physical Exam Vitals and nursing note reviewed. Exam conducted with a chaperone present.  Constitutional:      Appearance: Normal appearance.  Cardiovascular:     Rate and Rhythm: Normal rate and regular rhythm.     Pulses: Normal pulses.     Heart sounds: Normal heart sounds.  Pulmonary:     Effort: Pulmonary effort is normal.     Breath sounds: Normal breath sounds.  Abdominal:     Palpations: Abdomen is soft. There is no hepatomegaly, splenomegaly or mass.     Tenderness: There is no abdominal tenderness.  Musculoskeletal:  Right lower leg: No edema.     Left lower leg: No edema.  Lymphadenopathy:     Cervical: No cervical adenopathy.     Right cervical: No superficial, deep or posterior cervical adenopathy.    Left cervical: No superficial, deep or  posterior cervical adenopathy.     Upper Body:     Right upper body: No supraclavicular or axillary adenopathy.     Left upper body: No supraclavicular or axillary adenopathy.  Neurological:     General: No focal deficit present.     Mental Status: He is alert and oriented to person, place, and time.  Psychiatric:        Mood and Affect: Mood normal.        Behavior: Behavior normal.     LABS:      Latest Ref Rng & Units 04/25/2024    9:10 AM 01/24/2024    9:35 AM 12/23/2023    1:22 PM  CBC  WBC 4.0 - 10.5 K/uL 6.8  8.3  10.6   Hemoglobin 13.0 - 17.0 g/dL 83.9  83.5  82.7   Hematocrit 39.0 - 52.0 % 45.9  49.2  50.8   Platelets 150 - 400 K/uL 227  226  206       Latest Ref Rng & Units 04/25/2024    9:10 AM 01/24/2024    9:35 AM 12/23/2023    1:22 PM  CMP  Glucose 70 - 99 mg/dL 865  861  79   BUN 8 - 23 mg/dL 17  22  34   Creatinine 0.61 - 1.24 mg/dL 9.03  9.14  8.72   Sodium 135 - 145 mmol/L 134  136  137   Potassium 3.5 - 5.1 mmol/L 2.7  2.2  2.6   Chloride 98 - 111 mmol/L 93  86  83   CO2 22 - 32 mmol/L 28  36  39   Calcium 8.9 - 10.3 mg/dL 8.0  8.5  8.6   Total Protein 6.5 - 8.1 g/dL 6.5  6.8  6.9   Total Bilirubin 0.0 - 1.2 mg/dL 0.8  1.4  1.1   Alkaline Phos 38 - 126 U/L 65  72  60   AST 15 - 41 U/L 17  18  13    ALT 0 - 44 U/L 11  11  13       No results found for: CEA1, CEA / No results found for: CEA1, CEA No results found for: PSA1 No results found for: CAN199 No results found for: CAN125  No results found for: TOTALPROTELP, ALBUMINELP, A1GS, A2GS, BETS, BETA2SER, GAMS, MSPIKE, SPEI No results found for: TIBC, FERRITIN, IRONPCTSAT Lab Results  Component Value Date   LDH 110 11/17/2023     STUDIES:   CT CHEST ABDOMEN PELVIS W CONTRAST Result Date: 04/30/2024 CLINICAL DATA:  Small-cell lung cancer restaging * Tracking Code: BO * EXAM: CT CHEST, ABDOMEN, AND PELVIS WITH CONTRAST TECHNIQUE: Multidetector CT imaging of  the chest, abdomen and pelvis was performed following the standard protocol during bolus administration of intravenous contrast. RADIATION DOSE REDUCTION: This exam was performed according to the departmental dose-optimization program which includes automated exposure control, adjustment of the mA and/or kV according to patient size and/or use of iterative reconstruction technique. CONTRAST:  OMNIPAQUE  IOHEXOL  300 MG/ML  SOLN COMPARISON:  01/24/2024 FINDINGS: CT CHEST FINDINGS Cardiovascular: Left chest port catheter. Aortic atherosclerosis. Normal heart size. Three-vessel coronary artery calcifications. No pericardial effusion. Mediastinum/Nodes: No enlarged mediastinal, hilar, or axillary  lymph nodes. Extramedullary hematopoiesis about the lower thoracic spine (series 4, image 44). Thyroid  gland, trachea, and esophagus demonstrate no significant findings. Lungs/Pleura: Unchanged post treatment/post radiation appearance of the right chest with dense perihilar consolidation and fibrosis (series 3, image 80). No pleural effusion or pneumothorax. Musculoskeletal: No chest wall abnormality. No acute osseous findings. Chronic fracture deformities of the posterior left ribs. CT ABDOMEN PELVIS FINDINGS Hepatobiliary: No solid liver abnormality is seen. Multiple low-attenuation lesions throughout the liver, unchanged and again most likely small cysts or hemangiomata. No gallstones, gallbladder wall thickening, or biliary dilatation. Pancreas: Unremarkable. No pancreatic ductal dilatation or surrounding inflammatory changes. Spleen: Normal in size without significant abnormality. Adrenals/Urinary Tract: Unchanged treated metastasis of the lateral limb of the left adrenal gland with underlying benign adrenal adenomatous thickening (series 4, image 64). Kidneys are normal, without renal calculi, solid lesion, or hydronephrosis. Bladder is unremarkable. Stomach/Bowel: Stomach is within normal limits. Appendix appears  normal. No evidence of bowel wall thickening, distention, or inflammatory changes. Descending and sigmoid diverticulosis. Vascular/Lymphatic: Aortic atherosclerosis. No enlarged abdominal or pelvic lymph nodes. Reproductive: No mass or other abnormality. Other: Small fat containing bilateral inguinal hernias. No ascites. Musculoskeletal: No acute osseous findings. IMPRESSION: 1. Unchanged post treatment/post radiation appearance of the right chest with dense perihilar consolidation and fibrosis. 2. Unchanged treated metastasis of the lateral limb of the left adrenal gland with underlying benign adrenal adenomatous thickening. 3. No evidence of new or progressive disease in the chest, abdomen, or pelvis. 4. Coronary artery disease. Aortic Atherosclerosis (ICD10-I70.0). Electronically Signed   By: Marolyn JONETTA Jaksch M.D.   On: 04/30/2024 14:28

## 2024-05-04 NOTE — Patient Instructions (Addendum)
 [facility] at [facility] Discharge Instructions   You were seen and examined today by Dr. Rogers.  He reviewed the results of your lab work which are normal/stable.   He reviewed the results of your CT scan which did not show any evidence of cancer.   We will see you back in 4 months. We will repeat a brain MRI and CT scan prior to this visit.    Return as scheduled.    Thank you for choosing [facility] at [facility] to provide your oncology and hematology care.  To afford each patient quality time with our provider, please arrive at least 15 minutes before your scheduled appointment time.   If you have a lab appointment with the Cancer Center please come in thru the Main Entrance and check in at the main information desk.  You need to re-schedule your appointment should you arrive 10 or more minutes late.  We strive to give you quality time with our providers, and arriving late affects you and other patients whose appointments are after yours.  Also, if you no show three or more times for appointments you may be dismissed from the clinic at the providers discretion.     Again, thank you for choosing [facility].  Our hope is that these requests will decrease the amount of time that you wait before being seen by our physicians.       _____________________________________________________________  Should you have questions after your visit to [facility], please contact our office at [phone] and follow the prompts.  Our office hours are 8:00 a.m. and 4:30 p.m. Monday - Friday.  Please note that voicemails left after 4:00 p.m. may not be returned until the following business day.  We are closed weekends and major holidays.  You do have access to a nurse 24-7, just call the main number to the clinic [phone] and do not press any options, hold on the line and a nurse will answer the phone.    For prescription  refill requests, have your pharmacy contact our office and allow 72 hours.    Due to Covid, you will need to wear a mask upon entering the hospital. If you do not have a mask, a mask will be given to you at the Main Entrance upon arrival. For doctor visits, patients may have 1 support person age 30 or older with them. For treatment visits, patients can not have anyone with them due to social distancing guidelines and our immunocompromised population.

## 2024-05-05 ENCOUNTER — Other Ambulatory Visit: Payer: Self-pay

## 2024-05-23 DIAGNOSIS — I10 Essential (primary) hypertension: Secondary | ICD-10-CM | POA: Diagnosis not present

## 2024-05-23 DIAGNOSIS — J449 Chronic obstructive pulmonary disease, unspecified: Secondary | ICD-10-CM | POA: Diagnosis not present

## 2024-05-23 DIAGNOSIS — G4733 Obstructive sleep apnea (adult) (pediatric): Secondary | ICD-10-CM | POA: Diagnosis not present

## 2024-06-04 DIAGNOSIS — I10 Essential (primary) hypertension: Secondary | ICD-10-CM | POA: Diagnosis not present

## 2024-06-04 DIAGNOSIS — J449 Chronic obstructive pulmonary disease, unspecified: Secondary | ICD-10-CM | POA: Diagnosis not present

## 2024-06-04 DIAGNOSIS — G4733 Obstructive sleep apnea (adult) (pediatric): Secondary | ICD-10-CM | POA: Diagnosis not present

## 2024-06-08 ENCOUNTER — Other Ambulatory Visit: Payer: Self-pay | Admitting: *Deleted

## 2024-06-08 MED ORDER — FUROSEMIDE 40 MG PO TABS: 40.0000 mg | ORAL_TABLET | Freq: Every day | ORAL | 3 refills | Status: DC

## 2024-06-13 DIAGNOSIS — M79675 Pain in left toe(s): Secondary | ICD-10-CM | POA: Diagnosis not present

## 2024-06-13 DIAGNOSIS — B351 Tinea unguium: Secondary | ICD-10-CM | POA: Diagnosis not present

## 2024-06-13 DIAGNOSIS — M79674 Pain in right toe(s): Secondary | ICD-10-CM | POA: Diagnosis not present

## 2024-06-22 ENCOUNTER — Other Ambulatory Visit: Payer: Self-pay | Admitting: *Deleted

## 2024-06-22 MED ORDER — ZOLPIDEM TARTRATE 10 MG PO TABS: 10.0000 mg | ORAL_TABLET | Freq: Every evening | ORAL | 5 refills | Status: AC | PRN

## 2024-07-04 DIAGNOSIS — I10 Essential (primary) hypertension: Secondary | ICD-10-CM | POA: Diagnosis not present

## 2024-07-04 DIAGNOSIS — G4733 Obstructive sleep apnea (adult) (pediatric): Secondary | ICD-10-CM | POA: Diagnosis not present

## 2024-07-04 DIAGNOSIS — J449 Chronic obstructive pulmonary disease, unspecified: Secondary | ICD-10-CM | POA: Diagnosis not present

## 2024-08-23 ENCOUNTER — Other Ambulatory Visit: Payer: Self-pay | Admitting: Oncology

## 2024-08-24 DIAGNOSIS — B351 Tinea unguium: Secondary | ICD-10-CM | POA: Diagnosis not present

## 2024-08-24 DIAGNOSIS — M79674 Pain in right toe(s): Secondary | ICD-10-CM | POA: Diagnosis not present

## 2024-08-28 ENCOUNTER — Ambulatory Visit (HOSPITAL_COMMUNITY)
Admission: RE | Admit: 2024-08-28 | Discharge: 2024-08-28 | Disposition: A | Discharge status: Home / Self Care | Source: Ambulatory Visit | Attending: Hematology | Admitting: Hematology

## 2024-08-28 ENCOUNTER — Inpatient Hospital Stay: Attending: Hematology

## 2024-08-28 VITALS — BP 113/71 | HR 67 | Temp 97.7°F | Resp 16

## 2024-08-28 DIAGNOSIS — C3411 Malignant neoplasm of upper lobe, right bronchus or lung: Secondary | ICD-10-CM

## 2024-08-28 DIAGNOSIS — I700 Atherosclerosis of aorta: Secondary | ICD-10-CM | POA: Diagnosis not present

## 2024-08-28 DIAGNOSIS — Z79899 Other long term (current) drug therapy: Secondary | ICD-10-CM | POA: Insufficient documentation

## 2024-08-28 DIAGNOSIS — C3490 Malignant neoplasm of unspecified part of unspecified bronchus or lung: Secondary | ICD-10-CM

## 2024-08-28 DIAGNOSIS — R9389 Abnormal findings on diagnostic imaging of other specified body structures: Secondary | ICD-10-CM | POA: Diagnosis not present

## 2024-08-28 DIAGNOSIS — Z95828 Presence of other vascular implants and grafts: Secondary | ICD-10-CM

## 2024-08-28 LAB — CBC WITH DIFFERENTIAL/PLATELET
Abs Immature Granulocytes: 0.05 K/uL (ref 0.00–0.07)
Basophils Absolute: 0 K/uL (ref 0.0–0.1)
Basophils Relative: 1 %
Eosinophils Absolute: 0.2 K/uL (ref 0.0–0.5)
Eosinophils Relative: 3 %
HCT: 50.7 % (ref 39.0–52.0)
Hemoglobin: 17 g/dL (ref 13.0–17.0)
Immature Granulocytes: 1 %
Lymphocytes Relative: 13 %
Lymphs Abs: 0.9 K/uL (ref 0.7–4.0)
MCH: 31.7 pg (ref 26.0–34.0)
MCHC: 33.5 g/dL (ref 30.0–36.0)
MCV: 94.4 fL (ref 80.0–100.0)
Monocytes Absolute: 0.5 K/uL (ref 0.1–1.0)
Monocytes Relative: 7 %
Neutro Abs: 5.4 K/uL (ref 1.7–7.7)
Neutrophils Relative %: 75 %
Platelets: 214 K/uL (ref 150–400)
RBC: 5.37 MIL/uL (ref 4.22–5.81)
RDW: 13.9 % (ref 11.5–15.5)
WBC: 7 K/uL (ref 4.0–10.5)
nRBC: 0 % (ref 0.0–0.2)

## 2024-08-28 LAB — COMPREHENSIVE METABOLIC PANEL WITH GFR
ALT: <5 U/L (ref 0–44)
AST: 13 U/L — ABNORMAL LOW (ref 15–41)
Albumin: 4 g/dL (ref 3.5–5.0)
Alkaline Phosphatase: 84 U/L (ref 38–126)
Anion gap: 10 (ref 5–15)
BUN: 10 mg/dL (ref 8–23)
CO2: 28 mmol/L (ref 22–32)
Calcium: 8.3 mg/dL — ABNORMAL LOW (ref 8.9–10.3)
Chloride: 103 mmol/L (ref 98–111)
Creatinine, Ser: 0.89 mg/dL (ref 0.61–1.24)
GFR, Estimated: >60 mL/min (ref >60)
Glucose, Bld: 96 mg/dL (ref 70–99)
Potassium: 4.1 mmol/L (ref 3.5–5.1)
Sodium: 141 mmol/L (ref 135–145)
Total Bilirubin: 0.9 mg/dL (ref 0.0–1.2)
Total Protein: 6.8 g/dL (ref 6.5–8.1)

## 2024-08-28 LAB — TSH: TSH: 0.772 u[IU]/mL (ref 0.350–4.500)

## 2024-08-28 LAB — MAGNESIUM: Magnesium: 2.1 mg/dL (ref 1.7–2.4)

## 2024-08-28 MED ORDER — HEPARIN SOD (PORK) LOCK FLUSH 100 UNIT/ML IV SOLN
INTRAVENOUS | Status: AC
  Filled 2024-08-28: qty 5

## 2024-08-28 MED ORDER — HEPARIN SOD (PORK) LOCK FLUSH 100 UNIT/ML IV SOLN
500.0000 [IU] | Freq: Once | INTRAVENOUS | Status: AC
  Administered 2024-08-28: 500 [IU] via INTRAVENOUS

## 2024-08-28 MED ORDER — IOHEXOL 300 MG/ML  SOLN
100.0000 mL | Freq: Once | INTRAMUSCULAR | Status: AC | PRN
  Administered 2024-08-28: 100 mL via INTRAVENOUS

## 2024-08-28 MED ORDER — GADOBUTROL 1 MMOL/ML IV SOLN
10.0000 mL | Freq: Once | INTRAVENOUS | Status: AC | PRN
  Administered 2024-08-28: 10 mL via INTRAVENOUS

## 2024-08-28 NOTE — Progress Notes (Signed)
 Patients port flushed without difficulty.  Good blood return noted with no bruising or swelling noted at site.  Labs drawn per orders. Radiology due to deaccess pt.'s port due to patient having scans done today. VSS with discharge and left in satisfactory condition with no s/s of distress noted. All follow ups as scheduled.       Larry Moore

## 2024-08-30 ENCOUNTER — Other Ambulatory Visit: Payer: Self-pay | Admitting: Oncology

## 2024-09-05 ENCOUNTER — Inpatient Hospital Stay: Attending: Hematology | Admitting: Oncology

## 2024-09-05 VITALS — BP 99/64 | HR 64 | Temp 97.9°F | Resp 18 | Ht 67.5 in | Wt 242.0 lb

## 2024-09-05 DIAGNOSIS — J984 Other disorders of lung: Secondary | ICD-10-CM | POA: Insufficient documentation

## 2024-09-05 DIAGNOSIS — G4700 Insomnia, unspecified: Secondary | ICD-10-CM | POA: Insufficient documentation

## 2024-09-05 DIAGNOSIS — F419 Anxiety disorder, unspecified: Secondary | ICD-10-CM | POA: Diagnosis not present

## 2024-09-05 DIAGNOSIS — F17210 Nicotine dependence, cigarettes, uncomplicated: Secondary | ICD-10-CM | POA: Diagnosis not present

## 2024-09-05 DIAGNOSIS — I5030 Unspecified diastolic (congestive) heart failure: Secondary | ICD-10-CM | POA: Diagnosis not present

## 2024-09-05 DIAGNOSIS — C3411 Malignant neoplasm of upper lobe, right bronchus or lung: Secondary | ICD-10-CM | POA: Insufficient documentation

## 2024-09-05 DIAGNOSIS — E876 Hypokalemia: Secondary | ICD-10-CM | POA: Diagnosis not present

## 2024-09-05 NOTE — Progress Notes (Signed)
 Patient Care Team: Bertell Satterfield, MD as PCP - General (Internal Medicine) Celestia Joesph SQUIBB, RN as Oncology Nurse Navigator (Medical Oncology)  Clinic Day:  09/05/2024  Referring physician: Bertell Satterfield, MD   CHIEF COMPLAINT:  CC: Extensive stage small cell lung cancer   Larry Moore 73 y.o. male was transferred to my care after his prior physician has left.   ASSESSMENT & PLAN:   Assessment & Plan: Larry Moore  is a 73 y.o. male with extensive stage small cell lung cancer  Extensive Stage Small Cell Lung Carcinoma Initially diagnosed of metastatic small cell lung carcinoma in 2024 s/p radiation and carboplatin , etoposide  and atezolizumab  with amazing response. Atezolizumab  discontinued for pneumonitis Currently on surveillance  - We reviewed the recent CT scan and MRI brain findings together.  Patient has no evidence of recurrence of disease at this time. - Will repeat CT scan in 4 months and MRI in 8 months.  Return to clinic in 4 months with CT CAP.  Hypokalemia Patient is on 20 mEq 3 times daily.  Potassium today is 4.1 - Continue current dosage of potassium supplementation  Heart failure with preserved ejection fraction - Follows with a cardiologist and is currently on Lasix  40 mg daily and sotalol  80 mg twice daily.  Discontinue metolazone   Anxiety - Continue Xanax  2 mg at bedtime as needed.  Insomnia - Continue Ambien  10 mg at night as needed  Pneumonitis Patient is off of prednisone  and shortness of breath is stable at this time.  The patient understands the plans discussed today and is in agreement with them.  He knows to contact our office if he develops concerns prior to his next appointment.  43 minutes of total time was spent for this patient encounter, including preparation,review of records,  face-to-face counseling with the patient and coordination of care, physical exam, and documentation of the encounter.    Larry Moore,acting  as a neurosurgeon for Larry Dry, MD.,have documented all relevant documentation on the behalf of Larry Dry, MD,as directed by  Larry Dry, MD while in the presence of Larry Dry, MD.  I, Larry Dry MD, have reviewed the above documentation for accuracy and completeness, and I agree with the above.    Larry Dry, MD  [facility] [place] - A DEPT OF [facility] [address] [place] [address] Dept: [phone] Dept Fax: [phone]   Orders Placed This Encounter  Procedures   CT CHEST ABDOMEN PELVIS W CONTRAST    Standing Status:   Future    Expected Date:   01/03/2025    Expiration Date:   09/05/2025    If indicated for the ordered procedure, I authorize the administration of contrast media per Radiology protocol:   Yes    Does the patient have a contrast media/X-ray dye allergy?:   No    Preferred imaging location?:   [facility]    If indicated for the ordered procedure, I authorize the administration of oral contrast media per Radiology protocol:   Yes   CBC with Differential/Platelet    Standing Status:   Future    Expected Date:   01/03/2025    Expiration Date:   04/03/2025   Comprehensive metabolic panel with GFR    Standing Status:   Future    Expected Date:   01/03/2025    Expiration Date:   04/03/2025   TSH    Standing Status:  Future    Expected Date:   01/03/2025    Expiration Date:   04/03/2025   T4, free    Standing Status:   Future    Expected Date:   01/03/2025    Expiration Date:   04/03/2025     ONCOLOGY HISTORY:   I have reviewed his chart and materials related to his cancer extensively and collaborated history with the patient. Summary of oncologic history is as follows:   Diagnosis: Extensive stage small cell lung cancer   -Presentation: Productive cough with hemoptysis for 3 weeks. Hemoptysis improved after discontinuing Eliquis -01/19/2022: Chest X-ray: Right hilar  soft tissue fullness, suspicious for adenopathy or mass. Equivocal right base opacity which could represent minimal airspace disease or possibly a pulmonary nodule. -01/21/2022: CT Chest: Large (6.5 x 5.2 cm) right hilar mass surrounding and compressing the right upper lobe bronchus, bronchus intermedius, right middle lobe bronchus and right lower lobe bronchus centrally. There is also compression of the right lower lobe pulmonary artery. Nodular soft tissue lesion in the right lower lobe is continuous with the right hilar mass and could represent endobronchial spread of tumor or possibly the patient's primary lung cancer. Enlarged mediastinal lymph nodes but no findings for pulmonary metastatic disease. Indeterminate bilateral adrenal gland nodules. -01/24/2022: MRI Brain: NED -02/05/2022: Initial PET: Right hilar mass spanning across the right upper lobe, right lower lobe, and right middle lobe, hypermetabolic with maximum SUV of 11.6. Hypermetabolic satellite nodularity in the right lower lobe with hypermetabolic subcarinal and right paratracheal adenopathy in the chest. Sessile hypermetabolic pleural-based nodule along the right posterior costophrenic angle compatible with pleural metastatic disease. Hypermetabolic lesion in the left adrenal gland compatible with adrenal metastatic lesion. Small subcutaneous hypermetabolic foci in the right posterior subcutaneous tissues about at the level of the iliac crests. Suspicious for potential metastatic lesion. Single small hypermetabolic lesions in the humerus bilaterally, concerning for oligometastatic disease to the skeleton. Fairly low level activity in the low-density paraspinal masses at the T10 vertebral level, appearance and location favors extramedullary hematopoiesis. -01/29/2022: Right hilar mass FNA biopsy.  Cytology: Small cell carcinoma. Subcarinal station 7 lymph node positive for small cell carcinoma.  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 Multiple previously described small nodules in this vicinity are not clearly appreciated against the background of airspace disease. 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 -09/2023 - current: Imaging showing NED, though fibrosis developed in the right upper and lower lobes  -01/17/2024: Patient evaluated by Dr. Boyd [oncologist] at [facility] to discuss tarlatamab, lurbinectedin, and clinical trials if progression occurs  Current Treatment:  Surveillance  INTERVAL HISTORY:   Larry Moore is here today for follow up and to establish care with me for extensive stage small cell carcinoma. Patient is accompanied by his wife.   He has no complaints today. His dyspnea on exertion is stable. We discussed imaging results and future imaging surveillance. We have gone over medications prescribed by his previous oncologist and have given the appropriate refills.   His potassium is normal today and he is taking potassium supplements TID. He is also taking lasix  and sotalol  as  prescribed. He has discontinued metolazone .   He is a retired development worker, community. He is an active smoker of 1ppd.   I have reviewed the past medical history, past surgical history, social history and family history with the patient and they are unchanged from previous note.  ALLERGIES:  has no known allergies.  MEDICATIONS:  Current Outpatient Medications  Medication Sig Dispense Refill   acetaminophen  (TYLENOL ) 500  MG tablet Take 2 tablets (1,000 mg total) by mouth every 8 (eight) hours as needed for mild pain (pain score 1-3), fever or headache.     allopurinol  (ZYLOPRIM ) 300 MG tablet Take 600 mg by mouth daily.     alprazolam  (XANAX ) 2 MG tablet Take 1 tablet (2 mg total) by mouth at bedtime as needed for anxiety. 30 tablet 5   furosemide  (LASIX ) 40 MG tablet Take 1 tablet (40 mg total) by mouth daily. 30 tablet 3   lidocaine -prilocaine  (EMLA ) cream Apply topically as directed. 30 g 3   Multiple Vitamin (MULTIVITAMIN) tablet Take 1 tablet by mouth daily.     nicotine  (NICODERM CQ  - DOSED IN MG/24 HOURS) 21 mg/24hr patch Place 1 patch (21 mg total) onto the skin daily. 28 patch 0   pantoprazole  (PROTONIX ) 40 MG tablet Take 1 tablet (40 mg total) by mouth daily. 30 tablet 1   potassium chloride  (KLOR-CON ) 10 MEQ tablet Take 2 tablets by mouth three times daily 180 tablet 3   sotalol  (BETAPACE ) 80 MG tablet Take 80 mg by mouth 2 (two) times daily.     zolpidem  (AMBIEN ) 10 MG tablet Take 1 tablet (10 mg total) by mouth at bedtime as needed for sleep. 30 tablet 5   No current facility-administered medications for this visit.    VITALS:  Blood pressure 99/64, pulse 64, temperature 97.9 F (36.6 C), temperature source Tympanic, resp. rate 18, height 5' 7.5 (1.715 m), weight 242 lb (109.8 kg), SpO2 97%.  Wt Readings from Last 3 Encounters:  09/05/24 242 lb (109.8 kg)  05/04/24 234 lb 5.6 oz (106.3 kg)  02/02/24 223 lb 15.8 oz (101.6 kg)    Body mass index is 37.34 kg/m.  Performance status (ECOG): 1 - Symptomatic but completely ambulatory  PHYSICAL EXAM:   GENERAL:alert, no distress and comfortable LYMPH:  no palpable lymphadenopathy in the cervical, axillary or inguinal LUNGS: clear to auscultation and percussion with normal breathing effort HEART: regular rate & rhythm and no murmurs and no lower extremity edema ABDOMEN:abdomen soft, non-tender and normal bowel sounds Musculoskeletal:no  cyanosis of digits and no clubbing  NEURO: alert & oriented x 3 with fluent speech  LABORATORY DATA:  I have reviewed the data as listed   Lab Results  Component Value Date   WBC 7.0 08/28/2024   NEUTROABS 5.4 08/28/2024   HGB 17.0 08/28/2024   HCT 50.7 08/28/2024   MCV 94.4 08/28/2024   PLT 214 08/28/2024      Chemistry      Component Value Date/Time   NA 141 08/28/2024 1049   K 4.1 08/28/2024 1049   CL 103 08/28/2024 1049   CO2 28 08/28/2024 1049   BUN 10 08/28/2024 1049   CREATININE 0.89 08/28/2024 1049      Component Value Date/Time   CALCIUM 8.3 (L) 08/28/2024 1049   ALKPHOS 84 08/28/2024 1049   AST 13 (L) 08/28/2024 1049   ALT <5 08/28/2024 1049   BILITOT 0.9 08/28/2024 1049       Latest Reference Range & Units 08/28/24 10:49  TSH  0.350 - 4.500 uIU/mL 0.772   RADIOGRAPHIC STUDIES: I have personally reviewed the radiological images as listed and agreed with the findings in the report.  CT CHEST ABDOMEN PELVIS W CONTRAST EXAM: CT CHEST, ABDOMEN AND PELVIS WITH CONTRAST 08/28/2024 12:13:07 PM  TECHNIQUE: CT of the chest, abdomen and pelvis was performed with the administration of 100 mL of iohexol  (OMNIPAQUE ) 300 MG/ML solution. Multiplanar reformatted images are provided for review. Automated exposure control, iterative reconstruction, and/or weight based adjustment of the mA/kV was utilized to reduce the radiation dose to as low as reasonably achievable.  COMPARISON: Chest CT with contrast 11/22/2023, and chest, abdomen, and pelvis CT with contrast 01/24/2024 and 04/25/2024.  CLINICAL HISTORY: Metastatic disease evaluation. History of right upper lobe small cell lung cancer.  FINDINGS:  CHEST: MEDIASTINUM AND LYMPH NODES: Left chest IJ approach port catheter again terminates at the superior cavoatrial junction. There is tortuosity and atherosclerosis in the aorta and great vessels without aneurysm or dissection.  The heart is not enlarged.  There are left main and 3-vessel coronary calcifications heaviest in the LAD, and lipomatous hypertrophy of the interatrial septum.  Central pulmonary arteries are prominent with a 3.3 cm pulmonary trunk, unchanged. No central embolus.  There is a small chronic pericardial effusion. There are scattered retained secretions of the trachea.  Central airways are otherwise clear. No mediastinal, hilar or axillary lymphadenopathy.  LUNGS AND PLEURA: Elevated right hemidiaphragm and right chest volume loss are similar to prior studies.  Post-treatment perihilar/paramediastinal fibrosis is unchanged in appearance in the right upper and lower lobes.  There are coarse scarring changes along the dome of the right hemidiaphragm. There is a calcified granuloma anteriorly in the left upper lobe.  No new nodule is seen. No focal consolidation or pulmonary edema. No pleural effusion or pneumothorax.  BONES AND SOFT TISSUES: There is dendritic gynecomastia in both breasts. Paraspinal low density soft tissue in the lower thorax is again noted and unchanged, potential extramedullary hematopoiesis.  There is mild kyphosis and degenerative change of the thoracic spine, but no suspicious thoracic bone lesion. There are healed fractures of some of the left ribs.  ABDOMEN AND PELVIS: LIVER: The liver is mildly steatotic. There are multiple scattered tiny hypodensities in the substance of the liver which are too small to characterize but were seen previously and are unchanged including dating back to a similar study from 08/10/2022, therefore most likely are tiny cysts. No new liver abnormality is seen.  GALLBLADDER AND BILE DUCTS: Gallbladder is unremarkable. No biliary ductal dilatation.  SPLEEN: No acute abnormality.  PANCREAS: No acute abnormality.  ADRENAL GLANDS: There are small bilateral adrenal adenomas. Treated metastasis of the lateral limb of the left adrenal gland with no  interval change.  KIDNEYS, URETERS AND BLADDER: No stones in the kidneys or ureters. No hydronephrosis. No perinephric or periureteral stranding. Urinary bladder is unremarkable. There is no renal mass.  GI AND BOWEL: The stomach and small bowel, and the appendix are unremarkable. There is distal ascending and sigmoid colon diverticulosis without diverticulitis. There is no bowel obstruction.  REPRODUCTIVE ORGANS: No acute abnormality.  PERITONEUM AND RETROPERITONEUM: No ascites. No free air.  VASCULATURE: Aorta is normal in caliber. There is moderate abdominal aortic and branch vessel atherosclerosis without aneurysm.  ABDOMINAL AND PELVIS LYMPH NODES: No lymphadenopathy.  BONES AND SOFT TISSUES: Small chronic inguinal fat hernias. Osteopenia and degenerative changes of the lumbar spine.  Bridging osteophytes right SI joint. No visible bone metastasis. Subcentimeter bone [employer]  again noted and stable in the right hemisacrum.  IMPRESSION: 1. No CT evidence of metastatic disease. 2. Stable post-treatment changes in the right upper and lower lobes, including perihilar/paramediastinal fibrosis. 3. Stable treated metastasis of the lateral limb of the left adrenal gland. 4. Additional unchanged chronic findings discussed above.  Electronically signed by: Francis Quam MD 09/02/2024 01:26 AM EST RP Workstation: [id number]

## 2024-09-06 ENCOUNTER — Other Ambulatory Visit: Payer: Self-pay

## 2024-10-02 ENCOUNTER — Encounter: Payer: Self-pay | Admitting: *Deleted

## 2024-10-04 ENCOUNTER — Other Ambulatory Visit: Payer: Self-pay | Admitting: *Deleted

## 2024-10-04 MED ORDER — ALPRAZOLAM 2 MG PO TABS: 2.0000 mg | ORAL_TABLET | Freq: Every evening | ORAL | 5 refills | Status: AC | PRN

## 2024-10-24 ENCOUNTER — Other Ambulatory Visit: Payer: Self-pay | Admitting: *Deleted

## 2024-10-24 MED ORDER — FUROSEMIDE 40 MG PO TABS: 40.0000 mg | ORAL_TABLET | Freq: Every day | ORAL | 3 refills | Status: AC

## 2025-01-02 ENCOUNTER — Ambulatory Visit (HOSPITAL_COMMUNITY)

## 2025-01-02 ENCOUNTER — Inpatient Hospital Stay

## 2025-01-09 ENCOUNTER — Inpatient Hospital Stay: Admitting: Oncology
